# Patient Record
Sex: Female | Born: 1975 | Race: White | Hispanic: No | Marital: Married | Smoking: Former smoker
Health system: Southern US, Community
[De-identification: ages and names within clinical notes are randomized; demographics above are authoritative.]

## PROBLEM LIST (undated history)

## (undated) DIAGNOSIS — E282 Polycystic ovarian syndrome: Secondary | ICD-10-CM

## (undated) DIAGNOSIS — I10 Essential (primary) hypertension: Secondary | ICD-10-CM

## (undated) DIAGNOSIS — I4891 Unspecified atrial fibrillation: Secondary | ICD-10-CM

## (undated) DIAGNOSIS — E119 Type 2 diabetes mellitus without complications: Secondary | ICD-10-CM

## (undated) DIAGNOSIS — K219 Gastro-esophageal reflux disease without esophagitis: Secondary | ICD-10-CM

## (undated) DIAGNOSIS — M1990 Unspecified osteoarthritis, unspecified site: Secondary | ICD-10-CM

## (undated) DIAGNOSIS — N390 Urinary tract infection, site not specified: Secondary | ICD-10-CM

## (undated) DIAGNOSIS — F32A Depression, unspecified: Secondary | ICD-10-CM

## (undated) DIAGNOSIS — F419 Anxiety disorder, unspecified: Secondary | ICD-10-CM

## (undated) DIAGNOSIS — E669 Obesity, unspecified: Secondary | ICD-10-CM

## (undated) DIAGNOSIS — I499 Cardiac arrhythmia, unspecified: Secondary | ICD-10-CM

## (undated) DIAGNOSIS — F329 Major depressive disorder, single episode, unspecified: Secondary | ICD-10-CM

## (undated) HISTORY — DX: Unspecified osteoarthritis, unspecified site: M19.90

## (undated) HISTORY — PX: WISDOM TOOTH EXTRACTION: SHX21

## (undated) HISTORY — PX: DILATION AND CURETTAGE OF UTERUS: SHX78

---

## 2013-01-05 ENCOUNTER — Emergency Department (HOSPITAL_COMMUNITY)
Admission: EM | Admit: 2013-01-05 | Discharge: 2013-01-05 | Disposition: A | Discharge status: Home / Self Care | Payer: 59 | Source: Home / Self Care | Attending: Emergency Medicine | Admitting: Emergency Medicine

## 2013-01-05 DIAGNOSIS — R51 Headache: Secondary | ICD-10-CM

## 2013-01-05 DIAGNOSIS — R519 Headache, unspecified: Secondary | ICD-10-CM

## 2013-01-05 MED ORDER — KETOROLAC TROMETHAMINE 60 MG/2ML IM SOLN: 60.0000 mg | Freq: Once | INTRAMUSCULAR | Status: DC

## 2013-01-05 MED ORDER — CYCLOBENZAPRINE HCL 10 MG PO TABS: 10.0000 mg | ORAL_TABLET | Freq: Three times a day (TID) | ORAL | Status: DC | PRN

## 2013-01-05 MED ORDER — METHYLPREDNISOLONE ACETATE 40 MG/ML IJ SUSP: 80.0000 mg | Freq: Once | INTRAMUSCULAR | Status: DC

## 2013-01-05 MED ORDER — METHYLPREDNISOLONE ACETATE 80 MG/ML IJ SUSP
INTRAMUSCULAR | Status: AC
  Filled 2013-01-05: qty 1

## 2013-01-05 MED ORDER — KETOROLAC TROMETHAMINE 60 MG/2ML IM SOLN
INTRAMUSCULAR | Status: AC
  Filled 2013-01-05: qty 2

## 2013-01-05 MED ORDER — NAPROXEN 500 MG PO TABS: 500.0000 mg | ORAL_TABLET | Freq: Two times a day (BID) | ORAL | Status: DC

## 2013-01-05 MED ORDER — METHYLPREDNISOLONE ACETATE 40 MG/ML IJ SUSP
80.0000 mg | Freq: Once | INTRAMUSCULAR | Status: AC
  Administered 2013-01-05: 80 mg via INTRAMUSCULAR

## 2013-01-05 MED ORDER — KETOROLAC TROMETHAMINE 60 MG/2ML IM SOLN
60.0000 mg | Freq: Once | INTRAMUSCULAR | Status: AC
  Administered 2013-01-05: 60 mg via INTRAMUSCULAR

## 2013-01-05 NOTE — ED Provider Notes (Signed)
History     CSN: [id number]  Arrival date & time 01/05/13  1813   First MD Initiated Contact with Patient 01/05/13 1908      No chief complaint on file.   (Consider location/radiation/quality/duration/timing/severity/associated sxs/prior treatment) HPI Comments: 37 year old female presents for evaluation of headache that has been going on for 3 days. It began as pain and pressure in the back of her neck and has since spread up around scalp. The headache is bilateral and about a 5/10 in intensity. The pain is constant, not throbbing in nature. She did feel a bit nauseous yesterday but did not vomit. She denies any fever, chills, rash, photophobia, blurry vision, or any other associated symptoms. She has never experienced a headache exactly like this in the past.   No past medical history on file.  No past surgical history on file.  No family history on file.  History  Substance Use Topics  . Smoking status: Not on file  . Smokeless tobacco: Not on file  . Alcohol Use: Not on file    OB History   No data available      Review of Systems  Constitutional: Negative for fever and chills.  Eyes: Negative for visual disturbance.  Respiratory: Negative for cough and shortness of breath.   Cardiovascular: Negative for chest pain, palpitations and leg swelling.  Gastrointestinal: Positive for nausea. Negative for vomiting, abdominal pain and diarrhea.  Endocrine: Negative for polydipsia and polyuria.  Genitourinary: Negative for dysuria, urgency and frequency.  Musculoskeletal: Negative for myalgias and arthralgias.  Skin: Negative for rash.  Neurological: Positive for headaches. Negative for dizziness, seizures, syncope, weakness, light-headedness and numbness.    Allergies  Review of patient's allergies indicates not on file.  Home Medications   Current Outpatient Rx  Name  Route  Sig  Dispense  Refill  . cyclobenzaprine (FLEXERIL) 10 MG tablet   Oral   Take 1 tablet  (10 mg total) by mouth 3 (three) times daily as needed for muscle spasms.   30 tablet   0   . naproxen (NAPROSYN) 500 MG tablet   Oral   Take 1 tablet (500 mg total) by mouth 2 (two) times daily.   60 tablet   0     BP 137/91  Pulse 89  Temp(Src) 97.9 F (36.6 C) (Oral)  Resp 16  SpO2 97%  Physical Exam  Nursing note and vitals reviewed. Constitutional: She is oriented to person, place, and time. Vital signs are normal. She appears well-developed and well-nourished. No distress.  HENT:  Head: Atraumatic.  Eyes: EOM are normal. Pupils are equal, round, and reactive to light.  Cardiovascular: Normal rate, regular rhythm and normal heart sounds.  Exam reveals no gallop and no friction rub.   No murmur heard. Pulmonary/Chest: Effort normal and breath sounds normal. No respiratory distress. She has no wheezes. She has no rales.  Abdominal: Soft. There is no tenderness.  Musculoskeletal:       Cervical back: She exhibits tenderness (Paraspinous muscle tightness and tenderness at the base of the occiput ). She exhibits no bony tenderness.  Neurological: She is alert and oriented to person, place, and time. She has normal strength.  Skin: Skin is warm and dry. She is not diaphoretic.  Psychiatric: She has a normal mood and affect. Her behavior is normal. Judgment normal.    ED Course  Procedures (including critical care time)  Labs Reviewed - No data to display No results found.   1. Headache  MDM  The neurologic exam is completely normal. This appears to be the result of muscle tightness in the neck. We'll treat with NSAIDs and muscle relaxers, as well as heating pad. She will followup if she is not improved.   Meds ordered this encounter  Medications  . DISCONTD: ketorolac (TORADOL) injection 60 mg    Sig:   . DISCONTD: methylPREDNISolone acetate (DEPO-MEDROL) injection 80 mg    Sig:   . ketorolac (TORADOL) injection 60 mg    Sig:   . methylPREDNISolone  acetate (DEPO-MEDROL) injection 80 mg    Sig:   . naproxen (NAPROSYN) 500 MG tablet    Sig: Take 1 tablet (500 mg total) by mouth 2 (two) times daily.    Dispense:  60 tablet    Refill:  0  . cyclobenzaprine (FLEXERIL) 10 MG tablet    Sig: Take 1 tablet (10 mg total) by mouth 3 (three) times daily as needed for muscle spasms.    Dispense:  30 tablet    Refill:  0           Graylon Good, PA-C 01/05/13 1949

## 2013-01-05 NOTE — ED Provider Notes (Signed)
Medical screening examination/treatment/procedure(s) were performed by non-physician practitioner and as supervising physician I was immediately available for consultation/collaboration.  Leslee Home, M.D.  Reuben Likes, MD 01/05/13 [phone]

## 2013-03-08 ENCOUNTER — Ambulatory Visit: Payer: 59 | Admitting: Family Medicine

## 2013-03-14 ENCOUNTER — Ambulatory Visit: Payer: 59 | Admitting: Internal Medicine

## 2013-05-15 ENCOUNTER — Ambulatory Visit: Payer: 59 | Admitting: Internal Medicine

## 2013-05-15 DIAGNOSIS — Z0289 Encounter for other administrative examinations: Secondary | ICD-10-CM

## 2013-08-30 ENCOUNTER — Other Ambulatory Visit (HOSPITAL_COMMUNITY)
Admission: RE | Admit: 2013-08-30 | Discharge: 2013-08-30 | Disposition: A | Discharge status: Home / Self Care | Payer: 59 | Source: Ambulatory Visit | Attending: Family Medicine | Admitting: Family Medicine

## 2013-08-30 ENCOUNTER — Other Ambulatory Visit: Payer: Self-pay | Admitting: Physician Assistant

## 2013-08-30 DIAGNOSIS — Z124 Encounter for screening for malignant neoplasm of cervix: Secondary | ICD-10-CM | POA: Insufficient documentation

## 2013-09-06 ENCOUNTER — Other Ambulatory Visit: Payer: Self-pay | Admitting: Physician Assistant

## 2014-02-08 ENCOUNTER — Emergency Department (INDEPENDENT_AMBULATORY_CARE_PROVIDER_SITE_OTHER): Payer: 59

## 2014-02-08 ENCOUNTER — Emergency Department (INDEPENDENT_AMBULATORY_CARE_PROVIDER_SITE_OTHER)
Admission: EM | Admit: 2014-02-08 | Discharge: 2014-02-08 | Disposition: A | Discharge status: Home / Self Care | Payer: 59 | Source: Home / Self Care | Attending: Emergency Medicine | Admitting: Emergency Medicine

## 2014-02-08 ENCOUNTER — Encounter (HOSPITAL_COMMUNITY): Payer: Self-pay | Admitting: Emergency Medicine

## 2014-02-08 DIAGNOSIS — M129 Arthropathy, unspecified: Secondary | ICD-10-CM

## 2014-02-08 DIAGNOSIS — M1990 Unspecified osteoarthritis, unspecified site: Secondary | ICD-10-CM

## 2014-02-08 HISTORY — DX: Essential (primary) hypertension: I10

## 2014-02-08 HISTORY — DX: Type 2 diabetes mellitus without complications: E11.9

## 2014-02-08 LAB — URIC ACID: Uric Acid, Serum: 5.5 mg/dL (ref 2.4–7.0)

## 2014-02-08 MED ORDER — INDOMETHACIN 50 MG PO CAPS: 50.0000 mg | ORAL_CAPSULE | Freq: Two times a day (BID) | ORAL | Status: DC

## 2014-02-08 MED ORDER — HYDROCODONE-ACETAMINOPHEN 5-325 MG PO TABS: ORAL_TABLET | ORAL | Status: DC

## 2014-02-08 NOTE — ED Provider Notes (Signed)
Chief Complaint   Chief Complaint  Patient presents with  . Hand Pain    History of Present Illness   Julia Dickerson is a 38 year old female who has had a history since yesterday of pain and swelling of the right index finger MCP joint. She denies any trauma. She works from home and uses a mouse and keyboard all day long. It hurts to flex and extend although she has a full range of motion. She denies any other joint pain. She's had no fever or chills. No history of gout.  Review of Systems   Other than as noted above, the patient denies any of the following symptoms: Systemic:  No fevers or chills. Musculoskeletal:  No joint pain or arthritis.  Neurological:  No muscular weakness or paresthesias.  PMFSH   Past medical history, family history, social history, meds, and allergies were reviewed.     Physical Examination   Vital signs:  BP 162/95  Pulse 87  Temp(Src) 98.9 F (37.2 C) (Oral)  Resp 18  SpO2 98%  LMP 01/30/2014 Gen:  Alert and oriented times 3.  In no distress. Musculoskeletal:  Exam of the hand reveals there was swelling and pain to palpation over the MCP joint of the right index finger. The finger has a full range of motion actively and passively but with pain on movement. There is no heat or erythema.  Otherwise, all other joints had a full a ROM with no swelling, bruising or deformity.  No edema, pulses full. Extremities were warm and pink.  Capillary refill was brisk.  Skin:  Clear, warm and dry.  No rash. Neuro:  Alert and oriented times 3.  Muscle strength was normal.  Sensation was intact to light touch.    Labs   Results for orders placed during the hospital encounter of 02/08/14  URIC ACID      Result Value Ref Range   Uric Acid, Serum 5.5  2.4 - 7.0 mg/dL   Radiology   Dg Hand Complete Right  02/08/2014   CLINICAL DATA:  Left index finger swelling  EXAM: RIGHT HAND - COMPLETE 3+ VIEW  COMPARISON:  None.  FINDINGS: There is no evidence of fracture or  dislocation. There is no evidence of arthropathy or other focal bone abnormality. Soft tissues are unremarkable.  IMPRESSION: Negative.   Electronically Signed   By: Elige Ko   On: 02/08/2014 18:41   I reviewed the images independently and personally and concur with the radiologist's findings.  Assessment   The encounter diagnosis was Arthritis.  Probably due to overuse. No evidence of gout, infection, or trauma.  Plan  1.  Meds:  The following meds were prescribed:   Discharge Medication List as of 02/08/2014  7:15 PM    START taking these medications   Details  HYDROcodone-acetaminophen (NORCO/VICODIN) 5-325 MG per tablet 1 to 2 tabs every 4 to 6 hours as needed for pain., Print    indomethacin (INDOCIN) 50 MG capsule Take 1 capsule (50 mg total) by mouth 2 (two) times daily with a meal., Starting 02/08/2014, Until Discontinued, Normal        2.  Patient Education/Counseling:  The patient was given appropriate handouts, self care instructions, and instructed in symptomatic relief, including rest and activity, and elevation. Suggested that she rest the finger and avoid typing, use of mouse, and keyboarding for the next 3 days.  3.  Follow up:  The patient was told to follow up here if no better in  3 to 4 days, or sooner if becoming worse in any way, and given some red flag symptoms such as worsening pain, fever, swelling, or neurological symptoms which would prompt immediate return.        Reuben Likesavid C Willem Klingensmith, MD 02/08/14 [phone]

## 2014-02-08 NOTE — ED Notes (Signed)
Call back number for lab issues verified 

## 2014-02-08 NOTE — ED Notes (Signed)
C/o onset yesterday of pain and swelling right hand. Denies injury. 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 Pain mostly right index MP joint

## 2014-02-08 NOTE — Discharge Instructions (Signed)
Arthritis, Nonspecific °Arthritis is pain, redness, warmth, or puffiness (inflammation) of a joint. The joint may be stiff or hurt when you move it. One or more joints may be affected. There are many types of arthritis. Your doctor may not know what type you have right away. The most common cause of arthritis is wear and tear on the joint (osteoarthritis). °HOME CARE  °· Only take medicine as told by your doctor. °· Rest the joint as much as possible. °· Raise (elevate) your joint if it is puffy. °· Use crutches if the painful joint is in your leg. °· Drink enough fluids to keep your pee (urine) clear or pale yellow. °· Follow your doctor's diet instructions. °· Use cold packs for very bad joint pain for 10 to 15 minutes every hour. Ask your doctor if it is okay for you to use hot packs. °· Exercise as told by your doctor. °· Take a warm shower if you have stiffness in the morning. °· Move your sore joints throughout the day. °GET HELP RIGHT AWAY IF:  °· You have a fever. °· You have very bad joint pain, puffiness, or redness. °· You have many joints that are painful and puffy. °· You are not getting better with treatment. °· You have very bad back pain or leg weakness. °· You cannot control when you poop (bowel movement) or pee (urinate). °· You do not feel better in 24 hours or are getting worse. °· You are having side effects from your medicine. °MAKE SURE YOU:  °· Understand these instructions. °· Will watch your condition. °· Will get help right away if you are not doing well or get worse. °Document Released: 10/06/2009 Document Revised: 01/11/2012 Document Reviewed: 10/06/2009 °ExitCare® Patient Information ©2015 [employer]. This information is not intended to replace advice given to you by your health care provider. Make sure you discuss any questions you have with your health care provider. ° °

## 2014-03-25 ENCOUNTER — Encounter (HOSPITAL_COMMUNITY): Payer: Self-pay | Admitting: Emergency Medicine

## 2014-03-25 ENCOUNTER — Emergency Department (HOSPITAL_COMMUNITY)
Admission: EM | Admit: 2014-03-25 | Discharge: 2014-03-25 | Disposition: A | Discharge status: Home / Self Care | Payer: 59 | Attending: Emergency Medicine | Admitting: Emergency Medicine

## 2014-03-25 DIAGNOSIS — R319 Hematuria, unspecified: Secondary | ICD-10-CM | POA: Insufficient documentation

## 2014-03-25 DIAGNOSIS — E119 Type 2 diabetes mellitus without complications: Secondary | ICD-10-CM | POA: Diagnosis not present

## 2014-03-25 DIAGNOSIS — Z79899 Other long term (current) drug therapy: Secondary | ICD-10-CM | POA: Diagnosis not present

## 2014-03-25 DIAGNOSIS — I10 Essential (primary) hypertension: Secondary | ICD-10-CM | POA: Diagnosis not present

## 2014-03-25 DIAGNOSIS — Z87891 Personal history of nicotine dependence: Secondary | ICD-10-CM | POA: Insufficient documentation

## 2014-03-25 DIAGNOSIS — N390 Urinary tract infection, site not specified: Secondary | ICD-10-CM

## 2014-03-25 DIAGNOSIS — Z3202 Encounter for pregnancy test, result negative: Secondary | ICD-10-CM | POA: Insufficient documentation

## 2014-03-25 DIAGNOSIS — R300 Dysuria: Secondary | ICD-10-CM | POA: Insufficient documentation

## 2014-03-25 HISTORY — DX: Urinary tract infection, site not specified: N39.0

## 2014-03-25 LAB — URINALYSIS, ROUTINE W REFLEX MICROSCOPIC
Bilirubin Urine: NEGATIVE
Glucose, UA: NEGATIVE mg/dL
Ketones, ur: 15 mg/dL — AB
Nitrite: POSITIVE — AB
Protein, ur: 100 mg/dL — AB
Specific Gravity, Urine: 1.011 (ref 1.005–1.030)
Urobilinogen, UA: 1 mg/dL (ref 0.0–1.0)
pH: 5.5 (ref 5.0–8.0)

## 2014-03-25 LAB — URINE MICROSCOPIC-ADD ON

## 2014-03-25 LAB — POC URINE PREG, ED: Preg Test, Ur: NEGATIVE

## 2014-03-25 MED ORDER — CEPHALEXIN 500 MG PO CAPS: 500.0000 mg | ORAL_CAPSULE | Freq: Three times a day (TID) | ORAL | Status: DC

## 2014-03-25 MED ORDER — PHENAZOPYRIDINE HCL 100 MG PO TABS
200.0000 mg | ORAL_TABLET | Freq: Once | ORAL | Status: AC
  Administered 2014-03-25: 200 mg via ORAL
  Filled 2014-03-25: qty 2

## 2014-03-25 MED ORDER — PHENAZOPYRIDINE HCL 200 MG PO TABS: 200.0000 mg | ORAL_TABLET | Freq: Three times a day (TID) | ORAL | Status: DC | PRN

## 2014-03-25 MED ORDER — CEPHALEXIN 250 MG PO CAPS
500.0000 mg | ORAL_CAPSULE | Freq: Once | ORAL | Status: AC
  Administered 2014-03-25: 500 mg via ORAL
  Filled 2014-03-25: qty 2

## 2014-03-25 NOTE — ED Notes (Signed)
Pt not in room.

## 2014-03-25 NOTE — ED Provider Notes (Signed)
Medical screening examination/treatment/procedure(s) were performed by non-physician practitioner and as supervising physician I was immediately available for consultation/collaboration.     Carlette Palmatier E StSuzi Roots/31/15 [phone]

## 2014-03-25 NOTE — ED Provider Notes (Signed)
CSN: [id number]     Arrival date & time 03/25/14  7829 History   First MD Initiated Contact with Patient 03/25/14 [phone]     Chief Complaint  Patient presents with  . Hematuria  . Dysuria     (Consider location/radiation/quality/duration/timing/severity/associated sxs/prior Treatment) The history is provided by the patient and medical records.   38 y.o. F with HTN, DM2, UTI, presenting to the ED for dysuria which began last night.  Patient states she has frequent urination every few minutes which feelings like "i am urinating out razor blades".  Also notes some dysuria and cloudiness to her urine.  Denies flank pain, abdominal pain, fevers, sweats, chills.  No nausea, vomiting, diarrhea.  No vaginal complaints, new sexual partners, or concern for STD.  Hx UTI in the past with similar sx.  No hx of kidney stones.  No intervention tried PTA.  Past Medical History  Diagnosis Date  . Hypertension   . Diabetes mellitus without complication   . UTI (lower urinary tract infection)    Past Surgical History  Procedure Laterality Date  . Dilation and curettage of uterus     No family history on file. History  Substance Use Topics  . Smoking status: Former Games developer  . Smokeless tobacco: Not on file     Comment: vapor  . Alcohol Use: Yes     Comment: rare   OB History   Grav Para Term Preterm Abortions TAB SAB Ect Mult Living                 Review of Systems  Genitourinary: Positive for dysuria, frequency and hematuria.  All other systems reviewed and are negative.     Allergies  Sulfa antibiotics  Home Medications   Prior to Admission medications   Medication Sig Start Date End Date Taking? Authorizing Provider  atorvastatin (LIPITOR) 20 MG tablet Take 20 mg by mouth daily.  03/15/14  Yes Historical Provider, MD  benazepril (LOTENSIN) 20 MG tablet Take 20 mg by mouth daily.   Yes Historical Provider, MD  buPROPion (WELLBUTRIN SR) 150 MG 12 hr tablet Take 150 mg by mouth 2  (two) times daily.  03/01/14  Yes Historical Provider, MD  escitalopram (LEXAPRO) 10 MG tablet Take 10 mg by mouth daily.  03/15/14  Yes Historical Provider, MD  lisinopril (PRINIVIL,ZESTRIL) 10 MG tablet Take 10 mg by mouth daily.  03/01/14  Yes Historical Provider, MD  metFORMIN (GLUCOPHAGE) 1000 MG tablet Take 500 mg by mouth 2 (two) times daily with a meal.    Yes Historical Provider, MD   BP 143/70  Pulse 73  Temp(Src) 98.2 F (36.8 C) (Oral)  Resp 18  Ht  (1.727 m)  Wt 236 lb (107.049 kg)  BMI 35.89 kg/m2  SpO2 100%  LMP 03/16/2014  Physical Exam  Nursing note and vitals reviewed. Constitutional: She is oriented to person, place, and time. She appears well-developed and well-nourished. No distress.  HENT:  Head: Normocephalic and atraumatic.  Mouth/Throat: Oropharynx is clear and moist.  Eyes: Conjunctivae and EOM are normal. Pupils are equal, round, and reactive to light.  Neck: Normal range of motion. Neck supple.  Cardiovascular: Normal rate, regular rhythm and normal heart sounds.   Pulmonary/Chest: Effort normal and breath sounds normal. No respiratory distress. She has no wheezes.  Abdominal: Soft. Bowel sounds are normal. There is no tenderness. There is no guarding and no CVA tenderness.  Musculoskeletal: Normal range of motion.  Neurological: She is alert and  oriented to person, place, and time.  Skin: Skin is warm and dry. She is not diaphoretic.  Psychiatric: She has a normal mood and affect.    ED Course  Procedures (including critical care time) Labs Review Labs Reviewed  URINALYSIS, ROUTINE W REFLEX MICROSCOPIC - Abnormal; Notable for the following:    Color, Urine RED (*)    APPearance CLOUDY (*)    Hgb urine dipstick LARGE (*)    Ketones, ur 15 (*)    Protein, ur 100 (*)    Nitrite POSITIVE (*)    Leukocytes, UA MODERATE (*)    All other components within normal limits  URINE MICROSCOPIC-ADD ON - Abnormal; Notable for the following:    Squamous  Epithelial / LPF FEW (*)    Bacteria, UA MANY (*)    All other components within normal limits  POC URINE PREG, ED    Imaging Review No results found.   EKG Interpretation None      MDM   Final diagnoses:  UTI (lower urinary tract infection)  Dysuria  Hematuria   Urine nitrite +.  Abdominal exam is benign, no CVA tenderness to suggest kidney stones or pyelonephritis.  No vaginal sx. Patient afebrile and non-toxic.  Will start on abx and pyridium for comfort.  FU with PCP.  Discussed plan with patient, he/she acknowledged understanding and agreed with plan of care.  Return precautions given for new or worsening symptoms.  Garlon Hatchet, PA-C 03/25/14 0835  Garlon Hatchet, PA-C 03/25/14 [phone]

## 2014-03-25 NOTE — ED Notes (Signed)
Pt c/o painful urination. Pt gave urine sample prior to coming to room and is bloody. Pt has history of UTI in the past. Pt denies N/V.

## 2014-03-25 NOTE — Discharge Instructions (Signed)
Take the prescribed medication as directed.  Pyridium will turn your urine bright orange/red which is normal, may stain clothing. Follow-up with your primary care physician. Return to the ED for new or worsening symptoms.

## 2014-07-10 ENCOUNTER — Inpatient Hospital Stay (HOSPITAL_COMMUNITY)
Admission: AD | Admit: 2014-07-10 | Discharge: 2014-07-13 | DRG: 885 | Disposition: A | Discharge status: Home / Self Care | Payer: 59 | Source: Intra-hospital | Attending: Psychiatry | Admitting: Psychiatry

## 2014-07-10 ENCOUNTER — Encounter (HOSPITAL_COMMUNITY): Payer: Self-pay | Admitting: Emergency Medicine

## 2014-07-10 ENCOUNTER — Emergency Department (HOSPITAL_COMMUNITY)
Admission: EM | Admit: 2014-07-10 | Discharge: 2014-07-10 | Disposition: A | Discharge status: Home / Self Care | Payer: 59 | Attending: Emergency Medicine | Admitting: Emergency Medicine

## 2014-07-10 ENCOUNTER — Encounter (HOSPITAL_COMMUNITY): Payer: Self-pay | Admitting: Behavioral Health

## 2014-07-10 DIAGNOSIS — Z79899 Other long term (current) drug therapy: Secondary | ICD-10-CM

## 2014-07-10 DIAGNOSIS — Z87440 Personal history of urinary (tract) infections: Secondary | ICD-10-CM | POA: Insufficient documentation

## 2014-07-10 DIAGNOSIS — I10 Essential (primary) hypertension: Secondary | ICD-10-CM | POA: Insufficient documentation

## 2014-07-10 DIAGNOSIS — R45851 Suicidal ideations: Secondary | ICD-10-CM | POA: Diagnosis present

## 2014-07-10 DIAGNOSIS — F332 Major depressive disorder, recurrent severe without psychotic features: Principal | ICD-10-CM | POA: Diagnosis present

## 2014-07-10 DIAGNOSIS — Z792 Long term (current) use of antibiotics: Secondary | ICD-10-CM | POA: Insufficient documentation

## 2014-07-10 DIAGNOSIS — E119 Type 2 diabetes mellitus without complications: Secondary | ICD-10-CM | POA: Diagnosis present

## 2014-07-10 DIAGNOSIS — Z87891 Personal history of nicotine dependence: Secondary | ICD-10-CM | POA: Diagnosis not present

## 2014-07-10 DIAGNOSIS — Z3202 Encounter for pregnancy test, result negative: Secondary | ICD-10-CM | POA: Insufficient documentation

## 2014-07-10 DIAGNOSIS — F339 Major depressive disorder, recurrent, unspecified: Secondary | ICD-10-CM | POA: Diagnosis present

## 2014-07-10 DIAGNOSIS — F329 Major depressive disorder, single episode, unspecified: Secondary | ICD-10-CM | POA: Diagnosis present

## 2014-07-10 LAB — CBC
HCT: 43.8 % (ref 36.0–46.0)
Hemoglobin: 15.1 g/dL — ABNORMAL HIGH (ref 12.0–15.0)
MCH: 31 pg (ref 26.0–34.0)
MCHC: 34.5 g/dL (ref 30.0–36.0)
MCV: 89.9 fL (ref 78.0–100.0)
Platelets: 270 10*3/uL (ref 150–400)
RBC: 4.87 MIL/uL (ref 3.87–5.11)
RDW: 12.9 % (ref 11.5–15.5)
WBC: 9.3 10*3/uL (ref 4.0–10.5)

## 2014-07-10 LAB — COMPREHENSIVE METABOLIC PANEL
ALT: 20 U/L (ref 0–35)
AST: 15 U/L (ref 0–37)
Albumin: 4 g/dL (ref 3.5–5.2)
Alkaline Phosphatase: 81 U/L (ref 39–117)
Anion gap: 14 (ref 5–15)
BUN: 9 mg/dL (ref 6–23)
CO2: 23 mEq/L (ref 19–32)
Calcium: 9.8 mg/dL (ref 8.4–10.5)
Chloride: 99 mEq/L (ref 96–112)
Creatinine, Ser: 0.59 mg/dL (ref 0.50–1.10)
GFR calc Af Amer: >90 mL/min (ref >90)
GFR calc non Af Amer: >90 mL/min (ref >90)
Glucose, Bld: 153 mg/dL — ABNORMAL HIGH (ref 70–99)
Potassium: 4.3 mEq/L (ref 3.7–5.3)
Sodium: 136 mEq/L — ABNORMAL LOW (ref 137–147)
Total Bilirubin: 0.4 mg/dL (ref 0.3–1.2)
Total Protein: 8 g/dL (ref 6.0–8.3)

## 2014-07-10 LAB — RAPID URINE DRUG SCREEN, HOSP PERFORMED
Amphetamines: NOT DETECTED
Barbiturates: NOT DETECTED
Benzodiazepines: NOT DETECTED
Cocaine: NOT DETECTED
Opiates: NOT DETECTED
Tetrahydrocannabinol: NOT DETECTED

## 2014-07-10 LAB — SALICYLATE LEVEL: Salicylate Lvl: <2 mg/dL — ABNORMAL LOW (ref 2.8–20.0)

## 2014-07-10 LAB — ETHANOL: Alcohol, Ethyl (B): <11 mg/dL (ref 0–11)

## 2014-07-10 LAB — PREGNANCY, URINE: Preg Test, Ur: NEGATIVE

## 2014-07-10 LAB — ACETAMINOPHEN LEVEL: Acetaminophen (Tylenol), Serum: <15 ug/mL (ref 10–30)

## 2014-07-10 MED ORDER — ALUM & MAG HYDROXIDE-SIMETH 200-200-20 MG/5ML PO SUSP
30.0000 mL | ORAL | Status: DC | PRN
  Administered 2014-07-11 – 2014-07-12 (×2): 30 mL via ORAL
  Filled 2014-07-10 (×2): qty 30

## 2014-07-10 MED ORDER — ONDANSETRON HCL 4 MG PO TABS: 4.0000 mg | ORAL_TABLET | Freq: Three times a day (TID) | ORAL | Status: DC | PRN

## 2014-07-10 MED ORDER — ACETAMINOPHEN 325 MG PO TABS: 650.0000 mg | ORAL_TABLET | Freq: Four times a day (QID) | ORAL | Status: DC | PRN

## 2014-07-10 MED ORDER — IBUPROFEN 400 MG PO TABS: 600.0000 mg | ORAL_TABLET | Freq: Three times a day (TID) | ORAL | Status: DC | PRN

## 2014-07-10 MED ORDER — BENAZEPRIL HCL 20 MG PO TABS: 20.0000 mg | ORAL_TABLET | Freq: Every day | ORAL | Status: DC

## 2014-07-10 MED ORDER — MAGNESIUM HYDROXIDE 400 MG/5ML PO SUSP: 30.0000 mL | Freq: Every day | ORAL | Status: DC | PRN

## 2014-07-10 MED ORDER — ESCITALOPRAM OXALATE 10 MG PO TABS
10.0000 mg | ORAL_TABLET | Freq: Every day | ORAL | Status: DC
  Administered 2014-07-10: 10 mg via ORAL
  Filled 2014-07-10: qty 1

## 2014-07-10 MED ORDER — ACETAMINOPHEN 325 MG PO TABS: 650.0000 mg | ORAL_TABLET | ORAL | Status: DC | PRN

## 2014-07-10 MED ORDER — ZOLPIDEM TARTRATE 5 MG PO TABS: 5.0000 mg | ORAL_TABLET | Freq: Every evening | ORAL | Status: DC | PRN

## 2014-07-10 MED ORDER — ATORVASTATIN CALCIUM 20 MG PO TABS
20.0000 mg | ORAL_TABLET | Freq: Every day | ORAL | Status: DC
  Administered 2014-07-10 – 2014-07-13 (×4): 20 mg via ORAL
  Filled 2014-07-10 (×2): qty 1
  Filled 2014-07-10: qty 2
  Filled 2014-07-10 (×4): qty 1

## 2014-07-10 MED ORDER — IBUPROFEN 200 MG PO TABS: 200.0000 mg | ORAL_TABLET | Freq: Four times a day (QID) | ORAL | Status: DC | PRN

## 2014-07-10 MED ORDER — TRAZODONE HCL 50 MG PO TABS
50.0000 mg | ORAL_TABLET | Freq: Every evening | ORAL | Status: DC | PRN
  Administered 2014-07-10: 50 mg via ORAL
  Filled 2014-07-10: qty 2
  Filled 2014-07-10: qty 1

## 2014-07-10 MED ORDER — ALUM & MAG HYDROXIDE-SIMETH 200-200-20 MG/5ML PO SUSP: 30.0000 mL | ORAL | Status: DC | PRN

## 2014-07-10 MED ORDER — ATORVASTATIN CALCIUM 20 MG PO TABS
20.0000 mg | ORAL_TABLET | Freq: Every day | ORAL | Status: DC
  Filled 2014-07-10: qty 1

## 2014-07-10 MED ORDER — DIPHENHYDRAMINE HCL 25 MG PO TABS
25.0000 mg | ORAL_TABLET | Freq: Four times a day (QID) | ORAL | Status: DC | PRN
  Filled 2014-07-10: qty 1

## 2014-07-10 MED ORDER — LORAZEPAM 1 MG PO TABS: 1.0000 mg | ORAL_TABLET | Freq: Three times a day (TID) | ORAL | Status: DC | PRN

## 2014-07-10 MED ORDER — LISINOPRIL 10 MG PO TABS
10.0000 mg | ORAL_TABLET | Freq: Every day | ORAL | Status: DC
  Administered 2014-07-11 – 2014-07-13 (×3): 10 mg via ORAL
  Filled 2014-07-10 (×5): qty 1

## 2014-07-10 MED ORDER — METFORMIN HCL 500 MG PO TABS
1000.0000 mg | ORAL_TABLET | Freq: Two times a day (BID) | ORAL | Status: DC
  Administered 2014-07-10 – 2014-07-13 (×6): 1000 mg via ORAL
  Filled 2014-07-10 (×11): qty 2

## 2014-07-10 MED ORDER — LISINOPRIL 10 MG PO TABS
10.0000 mg | ORAL_TABLET | Freq: Every day | ORAL | Status: DC
  Administered 2014-07-10: 10 mg via ORAL
  Filled 2014-07-10: qty 1

## 2014-07-10 MED ORDER — BUPROPION HCL ER (SR) 150 MG PO TB12
150.0000 mg | ORAL_TABLET | Freq: Two times a day (BID) | ORAL | Status: DC
  Administered 2014-07-10: 150 mg via ORAL
  Filled 2014-07-10 (×2): qty 1

## 2014-07-10 MED ORDER — METFORMIN HCL 500 MG PO TABS: 1000.0000 mg | ORAL_TABLET | Freq: Two times a day (BID) | ORAL | Status: DC

## 2014-07-10 NOTE — BH Assessment (Addendum)
Tele Assessment Note   Julia Dickerson is an 38 y.o. female. Pt presents to [facility] with C/O increased depression and SI with thoughts and plans of cutting herself with a blade or suffocating herself with a pillow. Pt reports that she has battled depression for years but never received any treatment. Pt reports that she had a conversation with her husband yesterday and the conversation did not go well. Pt reports that her husband recently loss his job in November 2015, and could not continue his employment as a Naval architecttruck driver because they could not afford his cpap machine. Pt reports mood instability as evidenced by irritable mood and depression. Pt reports on-going conflict with her mother who she states did not support [place]. Pt denies illicit substance use. Pt denies history of aggression or violence. Pt denies HI and no AVH reported.  Consulted with AC Berneice Heinrichina Tate an Cherlyn CushingShelly Eisbach,NP whom is recommending inpatient psychiatric care for safety and stabilization.  Axis I: Major Depression, single episode Axis II: Deferred Axis III:  Past Medical History  Diagnosis Date  . Hypertension   . Diabetes mellitus without complication   . UTI (lower urinary tract infection)    Axis IV: economic problems, other psychosocial or environmental problems and problems related to social environment Axis V: 21-30 behavior considerably influenced by delusions or hallucinations OR serious impairment in judgment, communication OR inability to function in almost all areas  Past Medical History:  Past Medical History  Diagnosis Date  . Hypertension   . Diabetes mellitus without complication   . UTI (lower urinary tract infection)     Past Surgical History  Procedure Laterality Date  . Dilation and curettage of uterus    . Wisdom tooth extraction      Family History: No family history on file.  Social History:  reports that she has quit smoking. She does not have any  smokeless tobacco history on file. She reports that she drinks alcohol. She reports that she does not use illicit drugs.  Additional Social History:  Alcohol / Drug Use History of alcohol / drug use?: No history of alcohol / drug abuse  CIWA: CIWA-Ar BP: (!) 154/101 mmHg Pulse Rate: 87 COWS:    PATIENT STRENGTHS: (choose at least two) Ability for insight Active sense of humor Average or above average intelligence Capable of independent living  Allergies:  Allergies  Allergen Reactions  . Sulfa Antibiotics Hives    Home Medications:  (Not in a hospital admission)  OB/GYN Status:  Patient's last menstrual period was 05/16/2014.  General Assessment Data [facility] Is this a Tele or Face-to-Face Assessment?: Tele Assessment Is this an Initial Assessment or a Re-assessment for this encounter?: Initial Assessment Living Arrangements: Spouse/significant other, Other (Comment) (lives with husband and 2 roommates.) Can pt return to current living arrangement?: Yes Admission Status: Voluntary Is patient capable of signing voluntary admission?: Yes Transfer from: Home Referral Source: [facility]     [facility] Living Arrangements: Spouse/significant other, Other (Comment) (lives with husband and 2 roommates.) Name of Psychiatrist: No Current Provider Name of Therapist: No Current Provider     Risk to self with the past 6 months Suicidal Ideation: Yes-Currently Present Suicidal Intent: Yes-Currently Present Is patient at risk for suicide?: Yes Suicidal Plan?: Yes-Currently Present Specify Current Suicidal Plan:  (random thoughts, cutting with a blade, pillow-suffocate) Access to Means: Yes Specify Access to Suicidal Means: access to sharp knives  and pillows What has been your use of drugs/alcohol within the last 12 months?: Pt denies Previous Attempts/Gestures: No How many times?: 0 Other Self Harm Risks: none reported Triggers for Past Attempts:  Unpredictable Intentional Self Injurious Behavior: None Family Suicide History: No (Niece diagnosed with PTSD) Recent stressful life event(s): Conflict (Comment), Financial Problems, Turmoil (Comment) Persecutory voices/beliefs?: No Depression: Yes Depression Symptoms: Despondent, Insomnia, Loss of interest in usual pleasures, Feeling worthless/self pity, Feeling angry/irritable Substance abuse history and/or treatment for substance abuse?: No Suicide prevention information given to non-admitted patients: Not applicable  Risk to Others within the past 6 months Homicidal Ideation: No Thoughts of Harm to Others: No Current Homicidal Intent: No Current Homicidal Plan: No Access to Homicidal Means: No Identified Victim: no History of harm to others?: No Assessment of Violence: None Noted Violent Behavior Description: None noted Does patient have access to weapons?: Yes (Comment) (sharp knives in the home) Criminal Charges Pending?: No Does patient have a court date: No  Psychosis Hallucinations: None noted Delusions: None noted  Mental Status Report Appear/Hygiene: In scrubs Eye Contact: Fair Motor Activity: Freedom of movement Speech: Logical/coherent Level of Consciousness: Alert Mood: Depressed Affect: Depressed Anxiety Level: None Thought Processes: Coherent, Relevant Judgement: Unimpaired Orientation: Person, Place, Time, Situation Obsessive Compulsive Thoughts/Behaviors: None  Cognitive Functioning Concentration: Normal Memory: Recent Intact, Remote Intact IQ: Average Insight: Fair Impulse Control: Fair Appetite: Fair Weight Loss: 0 Weight Gain: 0 Sleep: No Change Total Hours of Sleep: 7 Vegetative Symptoms: None  ADLScreening [facility]) Patient's cognitive ability adequate to safely complete daily activities?: Yes Patient able to express need for assistance with ADLs?: Yes Independently performs ADLs?: Yes (appropriate for developmental  age)  Prior Inpatient Therapy Prior Inpatient Therapy: No Prior Therapy Dates: na Prior Therapy Facilty/Provider(s): na Reason for Treatment: na  Prior Outpatient Therapy Prior Outpatient Therapy: No Prior Therapy Dates: na Prior Therapy Facilty/Provider(s): na Reason for Treatment: na  ADL Screening (condition at time of admission) Patient's cognitive ability adequate to safely complete daily activities?: Yes Is the patient deaf or have difficulty hearing?: No Does the patient have difficulty seeing, even when wearing glasses/contacts?: No Does the patient have difficulty concentrating, remembering, or making decisions?: No Patient able to express need for assistance with ADLs?: Yes Does the patient have difficulty dressing or bathing?: No Independently performs ADLs?: Yes (appropriate for developmental age) Does the patient have difficulty walking or climbing stairs?: No Weakness of Legs: None Weakness of Arms/Hands: None  Home Assistive Devices/Equipment Home Assistive Devices/Equipment: None    Abuse/Neglect Assessment (Assessment to be complete while patient is alone) Physical Abuse: Denies Verbal Abuse: Denies Sexual Abuse: Denies Exploitation of patient/patient's resources: Denies Self-Neglect: Denies     Merchant navy officerAdvance Directives (For Healthcare) Does patient have an advance directive?: No Would patient like information on creating an advanced directive?: No - patient declined information    Additional Information 1:1 In Past 12 Months?: No CIRT Risk: No Elopement Risk: No Does patient have medical clearance?: Yes     Disposition:  Disposition Initial Assessment Completed for this Encounter: Yes Disposition of Patient: Other dispositions Other disposition(s):  (pending disposition.)  Tannar Broker, Len BlalockNajah Sabreen Yareli Carthen, MS, LCASA Assessment Counselor  07/10/2014 10:40 AM

## 2014-07-10 NOTE — BH Assessment (Signed)
Writer informed TTS (Najah) of the consult.  

## 2014-07-10 NOTE — ED Notes (Signed)
[place] transportation notified to [facility]
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
## 2014-07-10 NOTE — BHH Counselor (Signed)
[facility] per [facility]richina [facility]   Kateri PlummerKristin Akansha Wyche, M.S., LPCA, [facility] Licensed Professional Counselor Associate  Triage Specialist  [facility]  Therapeutic Triage Services Phone: [phone] Fax: [phone]

## 2014-07-10 NOTE — ED Notes (Signed)
tts in progress 
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
## 2014-07-10 NOTE — ED Notes (Signed)
Placed Patient in wine scrubs.

## 2014-07-10 NOTE — ED Notes (Signed)
Patient meal order taken. She has been given a snack of graham crackers with peanut butter and soda

## 2014-07-10 NOTE — ED Notes (Signed)
PATIENT UP TO DESK TO CALL HER SISTER. CALM DURING CALL

## 2014-07-10 NOTE — Discharge Instructions (Signed)
[facility]

## 2014-07-10 NOTE — ED Notes (Signed)
All belongings, medicine, valuables sent with pt to [facility]

## 2014-07-10 NOTE — ED Notes (Signed)
tts complete 

## 2014-07-10 NOTE — Tx Team (Signed)
Initial Interdisciplinary Treatment Plan   PATIENT STRESSORS: Financial difficulties Marital or family conflict   PATIENT STRENGTHS: Ability for insight General fund of knowledge Motivation for treatment/growth   PROBLEM LIST: Problem List/Patient Goals Date to be addressed Date deferred Reason deferred Estimated date of resolution  Suicidal Ideation 07/10/14     Depression 07/10/14                                                DISCHARGE CRITERIA:  Ability to meet basic life and health needs Improved stabilization in mood, thinking, and/or behavior Motivation to continue treatment in a less acute level of care  PRELIMINARY DISCHARGE PLAN: Attend PHP/IOP Return to previous living arrangement  PATIENT/FAMIILY INVOLVEMENT: This treatment plan has been presented to and reviewed with the patient, Moss Mcleanor Holdren.  The patient and family have been given the opportunity to ask questions and make suggestions.  Melanee SpryByrd, Ronecia E 07/10/2014, 4:39 PM
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
## 2014-07-10 NOTE — ED Notes (Signed)
Patient coming from home with thoughts of suicide.  Patient states "these feelings started yesterday after an argument with their roommates".  Patient has thought of "cutting self with razor or using a pillow".

## 2014-07-10 NOTE — BH Assessment (Signed)
[facility] and it is [facility] that [facility]. Glorious PeachNajah Alanna Storti, MS, LCASA Assessment Counselor

## 2014-07-11 DIAGNOSIS — F329 Major depressive disorder, single episode, unspecified: Secondary | ICD-10-CM

## 2014-07-11 LAB — GLUCOSE, CAPILLARY: Glucose-Capillary: 284 mg/dL — ABNORMAL HIGH (ref 70–99)

## 2014-07-11 MED ORDER — ESCITALOPRAM OXALATE 10 MG PO TABS
10.0000 mg | ORAL_TABLET | Freq: Every day | ORAL | Status: DC
  Administered 2014-07-11 – 2014-07-13 (×3): 10 mg via ORAL
  Filled 2014-07-11 (×5): qty 1

## 2014-07-11 MED ORDER — BUPROPION HCL ER (XL) 150 MG PO TB24
150.0000 mg | ORAL_TABLET | Freq: Every day | ORAL | Status: DC
  Administered 2014-07-11 – 2014-07-13 (×3): 150 mg via ORAL
  Filled 2014-07-11 (×5): qty 1

## 2014-07-11 NOTE — BHH Suicide Risk Assessment (Signed)
   Nursing information obtained from:    Demographic factors:   38 year old female, married , no children Current Mental Status:   See below Loss Factors:   Increased psychosocial stress/ marital stress  Historical Factors:   Depression Risk Reduction Factors:   Resilience  Total Time spent with patient: 45 minutes  CLINICAL FACTORS:  Worsening Depression in the context of significant psychosocial stressors  Psychiatric Specialty Exam: Physical Exam  ROS  Blood pressure 121/72, pulse 74, temperature 98.5 F (36.9 C), temperature source Oral, resp. rate 18, height 5\' 8"  (1.727 m), weight 112 kg (246 lb 14.6 oz), last menstrual period 05/06/2014.Body mass index is 37.55 kg/(m^2).  General Appearance: Fairly Groomed  Patent attorneyye Contact::  Fair  Speech:  Normal Rate  Volume:  Decreased  Mood:  Depressed  Affect:  Constricted and but reactive  Thought Process:  Linear  Orientation:  Other:  fully alert and attentive  Thought Content:  Rumination and denies hallucinations, no delusions, ruminative about psychosocial stressors involving her husband and friends  Suicidal Thoughts:  No at this time denies any thoughts of hurting self and  contracts for safety on unit   Homicidal Thoughts:  No  Memory:  Recent and remote grossly intact  Judgement:  Fair  Insight:  Fair  Psychomotor Activity:  Normal  Concentration:  Good  Recall:  Good  Fund of Knowledge:Good  Language: Good  Akathisia:  Negative  Handed:  Right  AIMS (if indicated):     Assets:  Desire for Improvement Resilience  Sleep:  Number of Hours: 6.5   Musculoskeletal: Strength & Muscle Tone: within normal limits Gait & Station: normal Patient leans: N/A  COGNITIVE FEATURES THAT CONTRIBUTE TO RISK:  Closed-mindedness    SUICIDE RISK:   Moderate:  Frequent suicidal ideation with limited intensity, and duration, some specificity in terms of plans, no associated intent, good self-control, limited dysphoria/symptomatology,  some risk factors present, and identifiable protective factors, including available and accessible social support.  PLAN OF CARE: Patient will be admitted to inpatient psychiatric unit for stabilization and safety. Will provide and encourage milieu participation. Provide medication management and maked adjustments as needed.  Will follow daily.    I certify that inpatient services furnished can reasonably be expected to improve the patient's condition.  Deforrest Bogle 07/11/2014, 3:44 PM
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
## 2014-07-11 NOTE — [facility] Group Notes (Signed)
[facility] LCSW Group Therapy  [facility] 1:15 - 2:30 PM  07/11/2014   Type of Therapy:  Group Therapy  Participation Level: Active  Participation Quality:  Attentive  Affect:  Appropriate  Cognitive:  Appropriate  Insight:  Developing/Improving   Engagement in Therapy:  Developing/Improving   Modes of Intervention:  Discussion, Education, Exploration, Problem-Solving, Rapport Building, Support   Summary of Progress/Problems:   Patient was attentive to speaker from the Mental [facility] Association as he shared his story of dealing with mental health/substance abuse issues and overcoming it by working a recovery program.  Patient expressed interest in their programs and services and received information on their agency.  She shared she would like to attend Saturday programming.  [name], [name] 07/11/2014

## 2014-07-11 NOTE — Progress Notes (Signed)
Pt is new to the unit this afternoon.  Pt reports she is dealing with some stressful situations at home and it reached the point that she was having suicidal thoughts.  She is denying SI now, but still feeling hopeless and helpless.  She denies HI/AV at this time.  Pt has been observed in the dayroom watching TV with minimal interaction with others on the unit.  Pt was encouraged to make her needs and concerns known to staff.  Pt voiced understanding.  Writer discussed meds available to her this evening.  Support and encouragement offered.  Safety maintained with q15 minute checks.

## 2014-07-11 NOTE — [facility] Group Notes (Signed)
[facility] Group Notes:  (Nursing/MHT/Case Management/Adjunct)  Date:  07/11/2014  Time:  0900am  Type of Therapy:  Leisure and Lifestyle Changes group  Participation Level:  Did Not Attend  Participation Quality:  Did not attend  Affect:  Did not attend  Cognitive:  Did not attend  Insight:  None  Engagement in Group:  Did not attend  Modes of Intervention:  Leisure activity recommendations  Summary of Progress/Problems: Pt attempted to attend group but was pulled out to speak with social worker and was unable to participate.  Lendell CapriceGuthrie, Glendon Dunwoody A 07/11/2014, 9:59 AM

## 2014-07-11 NOTE — Progress Notes (Signed)
Pt attended karaoke group this evening. Pt even performed 2 song selections.

## 2014-07-11 NOTE — Progress Notes (Signed)
D: Patient is alert and oriented. Pt's mood and affect is animated, blunted, but pleasant upon interaction. Pt rates her depression and hopelessness 5/10 today. Pt denies SI/HI and AVH at this time but reports she has "not yet" had suicidal thoughts this morning. Pt reports her goal for the day is "finding out what I can do to reduce my depression." Pt is attending groups. A: Pt encouraged to come to RN if SI thoughts present. Encouragement/Support provided to pt. Scheduled medications administered per providers orders (See MAR). 15 minute checks completed per protocol for patient safety.  R: Pt cooperative and receptive to nursing interventions.

## 2014-07-11 NOTE — Plan of Care (Signed)
Problem: Ineffective individual coping Goal: STG: Patient will remain free from self harm Outcome: Progressing Patient remains free from self harm. 15 minute checks completed per protocol for patient safety.   Problem: Diagnosis: Increased Risk For Suicide Attempt Goal: LTG-Patient Will Report Improved Mood and Deny Suicidal LTG (by discharge) Patient will report improved mood and deny suicidal ideation.  Outcome: Progressing Patient denies suicidal ideation and is observed to have appropriate affect and mood upon interaction. Goal: STG-Patient Will Attend All Groups On The Unit Outcome: Progressing Patient is attending all unit groups today. Goal: STG-Patient Will Comply With Medication Regime Outcome: Progressing Patient has complied with medication regimen today.  Comments:  Alena BillsGuthrie, Jaja Switalski A, RN

## 2014-07-11 NOTE — B[facility] Suicide Risk Assessment (Addendum)
B[facility] INPATIENT:  Family/Significant Other Suicide Prevention Education  Suicide Prevention Education:  Education Completed; Julia Dickerson, Husband, [phone]; has been identified by the patient as the family member/significant other with whom the patient will be residing, and identified as the person(s) who will aid the patient in the event of a mental health crisis (suicidal ideations/suicide attempt).  With written consent from the patient, the family member/significant other has been provided the following suicide prevention education, prior to the and/or following the discharge of the patient.  The suicide prevention education provided includes the following:  Suicide risk factors  Suicide prevention and interventions  National Suicide Hotline telephone number  [facility] assessment telephone number  [facility] [facility] Emergency Assistance 911  [facility] and/or Residential Mobile Crisis Unit telephone number  Request made of family/significant other to:  Remove weapons (e.g., guns, rifles, knives), all items previously/currently identified as safety concern.   Husband advised patient does not have access to weapons.    Remove drugs/medications (over-the-counter, prescriptions, illicit drugs), all items previously/currently identified as a safety concern.  The family member/significant other verbalizes understanding of the suicide prevention education information provided.  The family member/significant other agrees to remove the items of safety concern listed above.  Julia Dickerson, Julia Dickerson 07/11/2014, 10:38 AM

## 2014-07-11 NOTE — Progress Notes (Signed)
D: Patient pleasant and cooperative. Denies pain, SI, AH/VH at this time. Patient made no complaint tonight. Husband visited.  A: Patient encouraged to verbalize needs to staff. Support and encouragement given. Safety maintained. R: Patient very receptive. Will continue to monitor patient.

## 2014-07-12 LAB — GLUCOSE, CAPILLARY: Glucose-Capillary: 201 mg/dL — ABNORMAL HIGH (ref 70–99)

## 2014-07-12 MED ORDER — FAMOTIDINE 20 MG PO TABS
20.0000 mg | ORAL_TABLET | Freq: Two times a day (BID) | ORAL | Status: DC
  Administered 2014-07-12 – 2014-07-13 (×2): 20 mg via ORAL
  Filled 2014-07-12 (×6): qty 1
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
## 2014-07-12 NOTE — Progress Notes (Signed)
Patient did not attend group tonight 

## 2014-07-12 NOTE — Progress Notes (Signed)
Patient was seen watching TV and interacting with peers. Patient stated that she is looking forward to be discharged tomorrow. Denies pain, SI, AH/VH at this time. No new complaint. Will continue to monitor patient.

## 2014-07-12 NOTE — BHH Group Notes (Deleted)
B[facility] LCSW Group Therapy  Feelings Around Relapse  07/12/2014 3:45 PM  Type of Therapy:  Group Therapy  Participation Level:  Did Not Attend - patient was in bed sleeping and did not want to get up.   [name], Ericka Marcellus Hairston 07/12/2014, 3:45 PM

## 2014-07-12 NOTE — Clinical Social Work Note (Deleted)
CSW met with patient to complete PSA.  Patient was very depressed and tearful.  Patient shared she is sad because she does not know where her son is or how to get in contact with his father.  Patient shared she has two other children one who lives with his father and a daughter who lives with her mother.  She advised her parental rights to her daughter have been terminated.

## 2014-07-12 NOTE — [facility] Group Notes (Signed)
[facility]   07/12/2014 11:00 AM    Participation Quality:  Appropraite  Mood/Affect:  Appropriate  Depression Rating:  1  Anxiety Rating:  1  Thoughts of Suicide:  No  Will you contract for safety?   NA  Current AVH:  No  Plan for Discharge/Comments:  Patient attended discharge planning group and actively participated in group. She reports feeling much better today and hopes to discharge soon. [facility].  Suicide prevention education reviewed and SPE document provided.   Transportation Means: Patient has transportation.   Supports:  Patient has a support system.   Tasheba Henson, Joesph JulyQuylle Hairston

## 2014-07-12 NOTE — Progress Notes (Signed)
[facility]  07/12/2014 1:49 PM Julia Dickerson  MRN:  [id number] Subjective:  " I am feeling better". " I hope I'll be going home soon" Objective: Patient states she feels better today, and presents with improved mood and range of affect. Denies any severe neurovegetative symptoms of depression at this time. Sleep has been fair, appetite "OK". States she had visit from husband last night and that visit went well. Patient remains concerned about her psychosocial stressors, but less ruminative than upon admission, and feeling more optimistic that she will be able to address them . She states she is feeling more assertive now that she is feeling better, and plans to set some limits when she gets home to her husband and friends. States her husband " has been going back and forth about whether we will stay together with our friends or get a place of our own" and states " I have become the go between for my friends and my husband", which she finds very stressful. As above, she plans to discuss these concerns with her husband and friends and states " I feel I have my  Voice back  and that what I say will matter". No disruptive behaviors on unit.  Denies medication side effects. She is visible in day room, going to groups.   Diagnosis:    Total Time spent with patient: 25 minutes     ADL's:  Improved  Sleep: fair   Appetite:  Good  Suicidal Ideation:  Denies any suicidal ideations Homicidal Ideation:  Denies any homicidal ideations AEB (as evidenced by):  Psychiatric Specialty Exam: Physical Exam  ROS  Blood pressure 141/66, pulse 100, temperature 98.9 F (37.2 C), temperature source Oral, resp. rate 18, height 5\' 8"  (1.727 m), weight 112 kg (246 lb 14.6 oz), last menstrual period 05/06/2014.Body mass index is 37.55 kg/(m^2).  General Appearance: Fairly Groomed  Patent attorneyye Contact::  Good  Speech:  Normal Rate  Volume:  Normal  Mood:  improved, less depressed  Affect:  Appropriate   Thought Process:  Linear  Orientation:  Full (Time, Place, and Person)  Thought Content:  denies hallucinations, no delusions, not internally preoccupied, less ruminative about stressors  Suicidal Thoughts:  No At this time denies any suicidal plan or intent and contracts for safety on the unit  Homicidal Thoughts:  No  Memory:   Recent and remote grossly intact   Judgement:  Fair  Insight:  Fair  Psychomotor Activity:  Normal  Concentration:  Good  Recall:  Good  Fund of Knowledge:Good  Language: Good  Akathisia:  Negative  Handed:  Right  AIMS (if indicated):     Assets:  Desire for Improvement Resilience  Sleep:  Number of Hours: 5.25   Musculoskeletal: Strength & Muscle Tone: within normal limits Gait & Station: normal Patient leans: N/A  Current Medications: Current Facility-Administered Medications  Medication Dose Route Frequency Provider Last Rate Last Dose  . acetaminophen (TYLENOL) tablet 650 mg  650 mg Oral Q6H PRN Fransisca KaufmannLaura Davis, NP      . alum & mag hydroxide-simeth (MAALOX/MYLANTA) 200-200-20 MG/5ML suspension 30 mL  30 mL Oral Q4H PRN Fransisca KaufmannLaura Davis, NP   30 mL at 07/12/14 [id number]  . atorvastatin (LIPITOR) tablet 20 mg  20 mg Oral Daily Fransisca KaufmannLaura Davis, NP   20 mg at 07/12/14 0810  . buPROPion (WELLBUTRIN XL) 24 hr tablet 150 mg  150 mg Oral Daily Craige CottaFernando A Cobos, MD   150 mg at 07/12/14 0810  .  escitalopram (LEXAPRO) tablet 10 mg  10 mg Oral Daily Craige CottaFernando A Cobos, MD   10 mg at 07/12/14 0810  . ibuprofen (ADVIL,MOTRIN) tablet 200 mg  200 mg Oral Q6H PRN Fransisca KaufmannLaura Davis, NP      . lisinopril (PRINIVIL,ZESTRIL) tablet 10 mg  10 mg Oral Daily Fransisca KaufmannLaura Davis, NP   10 mg at 07/12/14 0810  . magnesium hydroxide (MILK OF MAGNESIA) suspension 30 mL  30 mL Oral Daily PRN Fransisca KaufmannLaura Davis, NP      . metFORMIN (GLUCOPHAGE) tablet 1,000 mg  1,000 mg Oral BID WC Fransisca KaufmannLaura Davis, NP   1,000 mg at 07/12/14 0810  . traZODone (DESYREL) tablet 50 mg  50 mg Oral QHS PRN Fransisca KaufmannLaura Davis, NP   50 mg at 07/10/14  2156    Lab Results:  Results for orders placed or performed during the hospital encounter of 07/10/14 (from the past 48 hour(s))  Glucose, capillary     Status: Abnormal   Collection Time: 07/11/14  7:53 AM  Result Value Ref Range   Glucose-Capillary 284 (H) 70 - 99 mg/dL  Glucose, capillary     Status: Abnormal   Collection Time: 07/12/14  6:05 AM  Result Value Ref Range   Glucose-Capillary 201 (H) 70 - 99 mg/dL   Comment 1 Notify RN    Comment 2 Documented in Chart     Physical Findings: AIMS: Facial and Oral Movements Muscles of Facial Expression: None, normal Lips and Perioral Area: None, normal Jaw: None, normal Tongue: None, normal,Extremity Movements Upper (arms, wrists, hands, fingers): None, normal Lower (legs, knees, ankles, toes): None, normal, Trunk Movements Neck, shoulders, hips: None, normal, Overall Severity Severity of abnormal movements (highest score from questions above): None, normal Incapacitation due to abnormal movements: None, normal Patient's awareness of abnormal movements (rate only patient's report): No Awareness, Dental Status Current problems with teeth and/or dentures?: No Does patient usually wear dentures?: No  CIWA:    COWS:      Assessment: Patient is improved compared to admission. At this time presenting with improved mood and today seems euthymic, with a full range of affect. No suicidal or homicidal ideaitons She is less ruminative, and feeling more optimistic about being able to work through and address her stressors effectively. She is tolerating medications well.   Treatment Plan Summary: Daily contact with patient to assess and evaluate symptoms and progress in treatment Medication management See below  Plan: Continue inpatient treatment and support  Consider Discharge Soon as she continues to improve  Continue Wellbutrin XL 150 mgrs QAM Continue Lexapro 10 mgrs QAM  Medical Decision Making Problem Points:  Established  problem, stable/improving (1), Review of last therapy session (1) and Review of psycho-social stressors (1) Data Points:  Review or order clinical lab tests (1) Review of medication regiment & side effects (2)  I certify that inpatient services furnished can reasonably be expected to improve the patient's condition.   COBOS, FERNANDO 07/12/2014, 1:49 PM

## 2014-07-12 NOTE — Progress Notes (Signed)
Patient ID: Julia Dickerson, female   DOB: Apr 09, 1976, 38 y.o.   MRN: [id number]  D: Patient reports that mood is stable at present. Gives depression "1" on scale and anxiety and hopelessness "0". Currently denies any SI at present. Reports she has been in a hospital for a couple of days now.  A: Staff will monitor on q 15 minute checks, follow treatment plan, and give medications as ordered. R: Cooperative on the unit.

## 2014-07-12 NOTE — Progress Notes (Signed)
Recreation Therapy Notes  Animal-Assisted Activity/Therapy (AAA/T) Program Checklist/Progress Notes Patient Eligibility Criteria Checklist & Daily Group note for Rec Tx Intervention  Date: 12.17.2015 Time: 2:45pm Location: [address] Dayroom   AAA/T Program Assumption of Risk Form signed by Patient/ or Parent Legal Guardian yes  Patient is free of allergies or sever asthma yes  Patient reports no fear of animals yes  Patient reports no history of cruelty to animals yes  Patient understands his/her participation is voluntary yes  Patient washes hands before animal contact yes  Patient washes hands after animal contact yes  Behavioral Response: Appropriate   Education: Hand Washing, Appropriate Animal Interaction   Education Outcome: Acknowledges education.   Clinical Observations/Feedback: Patient actively engaged with therapy dog, petting him appropriately and feeding him a treat in exchange for completion of basic obedience commands. Patient shared stories about her pets at home with group.   Marykay Lexenise L Nathian Stencil, LRT/CTRS  Glynda Soliday L 07/12/2014 10:10 AM

## 2014-07-13 DIAGNOSIS — F332 Major depressive disorder, recurrent severe without psychotic features: Principal | ICD-10-CM

## 2014-07-13 LAB — GLUCOSE, CAPILLARY: Glucose-Capillary: 142 mg/dL — ABNORMAL HIGH (ref 70–99)

## 2014-07-13 MED ORDER — ESCITALOPRAM OXALATE 10 MG PO TABS: 10.0000 mg | ORAL_TABLET | Freq: Every day | ORAL | Status: DC

## 2014-07-13 MED ORDER — TRAZODONE HCL 50 MG PO TABS: 50.0000 mg | ORAL_TABLET | Freq: Every evening | ORAL | Status: DC | PRN

## 2014-07-13 MED ORDER — METFORMIN HCL 1000 MG PO TABS: 1000.0000 mg | ORAL_TABLET | Freq: Two times a day (BID) | ORAL | Status: DC

## 2014-07-13 MED ORDER — ATORVASTATIN CALCIUM 20 MG PO TABS: 20.0000 mg | ORAL_TABLET | Freq: Every day | ORAL | Status: DC

## 2014-07-13 MED ORDER — BUPROPION HCL ER (XL) 150 MG PO TB24: 150.0000 mg | ORAL_TABLET | Freq: Every day | ORAL | Status: DC

## 2014-07-13 MED ORDER — LISINOPRIL 10 MG PO TABS: 10.0000 mg | ORAL_TABLET | Freq: Every day | ORAL | Status: DC

## 2014-07-13 NOTE — [facility] Group Notes (Signed)
[facility] LCSW Group Therapy Note  07/13/2014 1:15 PM   Type of Therapy and Topic:  Group Therapy: Avoiding Self-Sabotaging and Enabling Behaviors  Participation Level:  Active  Mood: Appropriate  Description of Group:     Learn how to identify obstacles, self-sabotaging and enabling behaviors, what are they, why do we do them and what needs do these behaviors meet? Discuss unhealthy relationships and how to have positive healthy boundaries with those that sabotage and enable. Explore aspects of self-sabotage and enabling in yourself and how to limit these self-destructive behaviors in everyday life. A scaling question is used to help patient look at where they are now in their motivation to change, from 1 to 10 (lowest to highest motivation).  Therapeutic Goals: 1. Patient will identify one obstacle that relates to self-sabotage and enabling behaviors 2. Patient will identify one personal self-sabotaging or enabling behavior they did prior to admission 3. Patient able to establish a plan to change the above identified behavior they did prior to admission:  4. Patient will demonstrate ability to communicate their needs through discussion and/or role plays.   Summary of Patient Progress:  Summary of Progress/Problems: The main focus of today's process group was for the patient to identify ways in which they have in the past sabotaged their own recovery. Motivational Interviewing was utilized to ask the group members what they get out of their substance use, isolation, noncompliance, self harm, negative self talk, etc and what reasons they may have for wanting to change. The Stages of Change were explained using a handout, and patients identified where they currently are with regard to stages of change. The patient expressed that she self sabotages in many ways. Upon processing the different ways she was able to state that she "often fails to advocate for myself. I will say things are okay when they  are not just to avoid rocking the boat or maybe angering someone else." Patient liked the concept of visualizing her nephew in her own circumstances "because I would stand up for him at any cost." Patient reports she is between the stages of contemplation and determination.     Therapeutic Modalities:   Cognitive Behavioral Therapy Person-Centered Therapy Motivational Interviewing   Carney Bernatherine C Demetrie Borge, LCSW

## 2014-07-13 NOTE — [facility] Suicide Risk Assessment (Signed)
Suicide Risk Assessment  Discharge Assessment     Demographic Factors:  Caucasian  Total Time spent with patient: 30 minutes  Psychiatric Specialty Exam:     Blood pressure 126/89, pulse 90, temperature 98.7 F (37.1 C), temperature source Oral, resp. rate 22, height 5\' 8"  (1.727 m), weight 112 kg (246 lb 14.6 oz), last menstrual period 05/06/2014.Body mass index is 37.55 kg/(m^2).  General Appearance: Fairly Groomed  Patent attorneyye Contact::  Fair  Speech:  Clear and Coherent  Volume:  Normal  Mood:  Euthymic  Affect:  Appropriate  Thought Process:  Coherent and Goal Directed  Orientation:  Full (Time, Place, and Person)  Thought Content:  plans as she moves on  Suicidal Thoughts:  No  Homicidal Thoughts:  No  Memory:  Immediate;   Fair Recent;   Fair Remote;   Fair  Judgement:  Fair  Insight:  Present  Psychomotor Activity:  Normal  Concentration:  Fair  Recall:  Fis[employer]Fair  Fund of Knowledge:Fair  Language: Fair  Akathisia:  No  Handed:    AIMS (if indicated):     Assets:  Desire for Improvement Housing Social Support  Sleep:  Number of Hours: 6    Musculoskeletal: Strength & Muscle Tone: within normal limits Gait & Station: normal Patient leans: N/A   Mental Status Per Nursing Assessment::   On Admission:     Current Mental Status by Physician: In full contact with reality. There are no active SI plans or intent. States she feels much better and she feels ready to be D/C home. She states her husband just got a job as he lost the last one as he has sleep apnea, he is a Naval architecttruck driver, and they could not afford to buy a CPAP machine. States that they live with roommates that are supportive and were instrumental in encouraging her to get some help. They became the main source of support as her husband was gone most of the time.    Loss Factors: NA  Historical Factors: NA  Risk Reduction Factors:   Living with another person, especially a relative and Positive social  support  Continued Clinical Symptoms:  Depression:   Severe  Cognitive Features That Contribute To Risk:  Closed-mindedness Polarized thinking Thought constriction (tunnel vision)    Suicide Risk:  Minimal: No identifiable suicidal ideation.  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 Hypertension   . Diabetes mellitus without complication   . UTI (lower urinary tract infection)    AXIS IV:  other psychosocial or environmental problems AXIS V:  61-70 mild symptoms  Plan Of Care/Follow-up recommendations:  Activity:  as tolerated Diet:  regular Follow up outpatient basis Is patient on multiple antipsychotic therapies at discharge:  No   Has Patient had three or more failed trials of antipsychotic monotherapy by history:  No  Recommended Plan for Multiple Antipsychotic Therapies: NA    Rockelle Heuerman A 07/13/2014, 3:25 PM

## 2014-07-13 NOTE — Progress Notes (Signed)
D) Pt being discharged to home accompanied by her family. Affect and mood area appropriate. Pt denies SI and HI Rates her depression, hopelessness and helplessness and anxiety all at a 0. Pt denies SI and HI. A) Given support and provided with a 1:1. All belongings returned to Pt. All discharge plans explained to Pt and she was able to repeat the instructions back to this Clinical research associatewriter. Pt given support, reassurance and understanding. All medications explained to Pt. R) Pt denies SI and HI

## 2014-07-13 NOTE — Discharge Summary (Signed)
Physician Discharge Summary Note  Patient:  Julia Dickerson is an 38 y.o., female MRN:  [id number] DOB:  [birth year] Patient phone:  [phone] (home)  Patient address:   [address] [place] [id number],  Total Time spent with patient: 30 minutes  Date of Admission:  07/10/2014 Date of Discharge: 07/13/14  Reason for Admission:  Mood stabilization treatments  Discharge Diagnoses: Active Problems:   Major depression, recurrent  Psychiatric Specialty Exam: Physical Exam  Review of Systems  Constitutional: Negative.   HENT: Negative.   Eyes: Negative.   Respiratory: Negative.   Cardiovascular: Negative.   Gastrointestinal: Negative.   Genitourinary: Negative.   Musculoskeletal: Negative.   Skin: Negative.   Neurological: Negative.   Endo/Heme/Allergies: Negative.   Psychiatric/Behavioral: Positive for depression (Stable ). Negative for suicidal ideas.    Blood pressure 126/89, pulse 90, temperature 98.7 F (37.1 C), temperature source Oral, resp. rate 22, height 5\' 8"  (1.727 m), weight 112 kg (246 lb 14.6 oz), last menstrual period 05/06/2014.Body mass index is 37.55 kg/(m^2).       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 Hypertension   . Diabetes mellitus without complication   . UTI (lower urinary tract infection)    AXIS IV: other psychosocial or environmental problems AXIS V: 61-70 mild symptoms  Level of Care:  OP  Hospital Course:  Julia Dickerson is an 38 y.o. female. Pt presents to [facility] with C/O increased depression and SI with  thoughts and plans of cutting herself with a blade or suffocating herself with a pillow. Pt reports that she has battled depression for years but never received any treatment. Pt reports that she had a conversation with her husband yesterday and the conversation did not go well. Pt reports that her husband recently loss his job in November 2015, and could not continue his employment as a Naval architecttruck driver because they could not afford his cpap machine. Pt reports mood instability as evidenced by irritable mood and depression. Pt reports on-going conflict with her mother who she states did not support [place] in 2014. Pt denies illicit substance use. Pt denies history of aggression or violence. Pt denies HI and no AVH reported.         Julia Dickerson was admitted to the adult unit where she was evaluated and her symptoms were identified. Medication management was discussed and implemented. Her Wellbutrin was changed to the XL version at 150 mg daily for improved treatment of her depression. Her home medication of Lexapro 10 mg daily was continued.  She was encouraged to participate in unit programming. Medical problems were identified and treated appropriately. Home medication was restarted as needed.  She was evaluated each day by a clinical provider to ascertain the patient's response to treatment.  Improvement was noted by the patient's report of decreasing symptoms, improved sleep and appetite, affect, medication tolerance, behavior, and participation in unit programming.  The patient was asked each day to complete a self inventory noting mood, mental status, pain, new symptoms, anxiety and concerns.         She responded well to  medication and being in a therapeutic and supportive environment. Positive and appropriate behavior was noted and the patient was motivated for recovery.  She worked closely with the treatment team and case manager to develop a discharge plan with  appropriate goals. Coping skills, problem solving as well as relaxation therapies were also part of the unit programming. Patient was noted to ruminate less about her psychosocial stressors. She planned to communicate more assertively with her husband after discharge about establishing some boundaries with their friends.          By the day of discharge she was in much improved condition than upon admission.  Symptoms were reported as significantly decreased or resolved completely.  The patient denied SI/HI and voiced no AVH. She was motivated to continue taking medication with a goal of continued improvement in mental health. Julia Dickerson was discharged home with a plan to follow up as noted below. Patient was provided with medication samples and prescriptions at time of discharge. She left [facility] in stable condition with all items returned to her.   Consults:  None  Significant Diagnostic Studies:  Chemistry panel, CBC, UDS negative, Daily blood glucose monitoring   Discharge Vitals:   Blood pressure 126/89, pulse 90, temperature 98.7 F (37.1 C), temperature source Oral, resp. rate 22, height 5\' 8"  (1.727 m), weight 112 kg (246 lb 14.6 oz), last menstrual period 05/06/2014. Body mass index is 37.55 kg/(m^2). Lab Results:   Results for orders placed or performed during the hospital encounter of 07/10/14 (from the past 72 hour(s))  Glucose, capillary     Status: Abnormal   Collection Time: 07/11/14  7:53 AM  Result Value Ref Range   Glucose-Capillary 284 (H) 70 - 99 mg/dL  Glucose, capillary     Status: Abnormal   Collection Time: 07/12/14  6:05 AM  Result Value Ref Range   Glucose-Capillary 201 (H) 70 - 99 mg/dL   Comment 1 Notify RN    Comment 2 Documented in Chart   Glucose, capillary     Status: Abnormal   Collection Time: 07/13/14  6:13 AM  Result Value Ref Range   Glucose-Capillary 142 (H) 70 - 99 mg/dL    Physical Findings: AIMS: Facial and Oral Movements Muscles of Facial  Expression: None, normal Lips and Perioral Area: None, normal Jaw: None, normal Tongue: None, normal,Extremity Movements Upper (arms, wrists, hands, fingers): None, normal Lower (legs, knees, ankles, toes): None, normal, Trunk Movements Neck, shoulders, hips: None, normal, Overall Severity Severity of abnormal movements (highest score from questions above): None, normal Incapacitation due to abnormal movements: None, normal Patient's awareness of abnormal movements (rate only patient's report): No Awareness, Dental Status Current problems with teeth and/or dentures?: No Does patient usually wear dentures?: No  CIWA:    COWS:     Psychiatric Specialty Exam: See Psychiatric Specialty Exam and Suicide Risk Assessment completed by Attending Physician prior to discharge.  Discharge destination:  Home  Is patient on multiple antipsychotic therapies at discharge:  No   Has Patient had three or more failed trials of antipsychotic monotherapy by history:  No  Recommended Plan for Multiple Antipsychotic Therapies: NA      Discharge Instructions    Discharge instructions    Complete by:  As directed   Please follow up with your Primary Care Provider for further management of chronic medical problem such as Diabetes and elevated cholesterol.            Medication List    STOP  taking these medications        buPROPion 150 MG 12 hr tablet  Commonly known as:  WELLBUTRIN SR  Replaced by:  buPROPion 150 MG 24 hr tablet     cephALEXin 500 MG capsule  Commonly known as:  KEFLEX     diphenhydrAMINE 25 MG tablet  Commonly known as:  BENADRYL     phenazopyridine 200 MG tablet  Commonly known as:  PYRIDIUM      TAKE these medications      Indication   atorvastatin 20 MG tablet  Commonly known as:  LIPITOR  Take 1 tablet (20 mg total) by mouth daily.   Indication:  Elevation of Both Cholesterol and Triglycerides in Blood     buPROPion 150 MG 24 hr tablet  Commonly known as:   WELLBUTRIN XL  Take 1 tablet (150 mg total) by mouth daily.   Indication:  Major Depressive Disorder     escitalopram 10 MG tablet  Commonly known as:  LEXAPRO  Take 1 tablet (10 mg total) by mouth daily.   Indication:  Depression     ibuprofen 200 MG tablet  Commonly known as:  ADVIL,MOTRIN  Take 200 mg by mouth every 6 (six) hours as needed for mild pain.      lisinopril 10 MG tablet  Commonly known as:  PRINIVIL,ZESTRIL  Take 1 tablet (10 mg total) by mouth daily.   Indication:  High Blood Pressure     metFORMIN 1000 MG tablet  Commonly known as:  GLUCOPHAGE  Take 1 tablet (1,000 mg total) by mouth 2 (two) times daily with a meal.   Indication:  Type 2 Diabetes     traZODone 50 MG tablet  Commonly known as:  DESYREL  Take 1 tablet (50 mg total) by mouth at bedtime as needed for sleep.   Indication:  Trouble Sleeping       Follow-up Information    Follow up with Nathen May - [facility] On 07/22/2014.   Why:  Monday, July 22, 2014 at 4:0-0 PM   Contact information:   [address] [place], [place]  [address]  [phone]      Follow up with Dr. Lolly Mustache - [facility] On 08/16/2014.   Why:  Friday, August 16, 2013 at [facility]. Please arrive at 8:30 AM with completed registration packet.   Contact information:   [address] [place], [place]   [address]  [phone]     Follow-up recommendations:    Activity: as tolerated Diet: regular Follow up outpatient basis  Comments:   Take all your medications as prescribed by your mental healthcare provider.  Report any adverse effects and or reactions from your medicines to your outpatient provider promptly.  Patient is instructed and cautioned to not engage in alcohol and or illegal drug use while on prescription medicines.  In the event of worsening symptoms, patient is instructed to call the crisis hotline, 911 and or go to the nearest ED for appropriate evaluation and treatment of  symptoms.  Follow-up with your primary care provider for your other medical issues, concerns and or health care needs.   Total Discharge Time:  Greater than 30 minutes.  SignedFransisca Kaufmann NP-C 07/13/2014, 4:33 PM  I personally assessed the patient and formulated the plan Madie Reno A. Dub Mikes, M.D.

## 2014-07-16 NOTE — Progress Notes (Signed)
Patient Discharge Instructions:  After Visit Summary (AVS):   Faxed to:  07/16/14 Discharge Summary Note:   Faxed to:  07/16/14 Psychiatric Admission Assessment Note:   Faxed to:  07/16/14 Suicide Risk Assessment - Discharge Assessment:   Faxed to:  07/16/14 Faxed/Sent to the Next Level Care provider:  07/16/14 Next Level Care Provider Has Access to the EMR, 07/16/14  F[facility] to [facility] @ [phone] Records provided to [facility] via [facility]/Epic access.  Jerelene ReddenSheena E [place], 07/16/2014, 4:00 PM

## 2014-08-16 ENCOUNTER — Ambulatory Visit (HOSPITAL_COMMUNITY): Payer: Self-pay | Admitting: Psychiatry

## 2014-09-27 ENCOUNTER — Emergency Department (HOSPITAL_COMMUNITY)
Admission: EM | Admit: 2014-09-27 | Discharge: 2014-09-27 | Disposition: A | Discharge status: Nursing Home (Includes ICF) | Payer: 59 | Source: Home / Self Care | Attending: Family Medicine | Admitting: Family Medicine

## 2014-09-27 ENCOUNTER — Encounter (HOSPITAL_COMMUNITY): Payer: Self-pay | Admitting: *Deleted

## 2014-09-27 ENCOUNTER — Encounter (HOSPITAL_COMMUNITY): Payer: Self-pay | Admitting: Emergency Medicine

## 2014-09-27 ENCOUNTER — Emergency Department (HOSPITAL_COMMUNITY)
Admission: EM | Admit: 2014-09-27 | Discharge: 2014-09-27 | Disposition: A | Discharge status: Home / Self Care | Payer: 59 | Attending: Emergency Medicine | Admitting: Emergency Medicine

## 2014-09-27 DIAGNOSIS — F329 Major depressive disorder, single episode, unspecified: Secondary | ICD-10-CM | POA: Insufficient documentation

## 2014-09-27 DIAGNOSIS — E119 Type 2 diabetes mellitus without complications: Secondary | ICD-10-CM | POA: Insufficient documentation

## 2014-09-27 DIAGNOSIS — Z87891 Personal history of nicotine dependence: Secondary | ICD-10-CM | POA: Insufficient documentation

## 2014-09-27 DIAGNOSIS — I10 Essential (primary) hypertension: Secondary | ICD-10-CM | POA: Diagnosis not present

## 2014-09-27 DIAGNOSIS — Z79899 Other long term (current) drug therapy: Secondary | ICD-10-CM | POA: Insufficient documentation

## 2014-09-27 DIAGNOSIS — M546 Pain in thoracic spine: Secondary | ICD-10-CM | POA: Diagnosis not present

## 2014-09-27 DIAGNOSIS — R079 Chest pain, unspecified: Secondary | ICD-10-CM | POA: Insufficient documentation

## 2014-09-27 DIAGNOSIS — R0789 Other chest pain: Secondary | ICD-10-CM

## 2014-09-27 DIAGNOSIS — Z87440 Personal history of urinary (tract) infections: Secondary | ICD-10-CM | POA: Diagnosis not present

## 2014-09-27 DIAGNOSIS — M25511 Pain in right shoulder: Secondary | ICD-10-CM | POA: Insufficient documentation

## 2014-09-27 HISTORY — DX: Major depressive disorder, single episode, unspecified: F32.9

## 2014-09-27 HISTORY — DX: Depression, unspecified: F32.A

## 2014-09-27 HISTORY — DX: Polycystic ovarian syndrome: E28.2

## 2014-09-27 MED ORDER — MELOXICAM 7.5 MG PO TABS: 7.5000 mg | ORAL_TABLET | Freq: Every day | ORAL | Status: DC | PRN

## 2014-09-27 MED ORDER — IBUPROFEN 400 MG PO TABS
600.0000 mg | ORAL_TABLET | Freq: Once | ORAL | Status: AC
  Administered 2014-09-27: 600 mg via ORAL
  Filled 2014-09-27 (×2): qty 1

## 2014-09-27 MED ORDER — TRAMADOL HCL 50 MG PO TABS: 50.0000 mg | ORAL_TABLET | Freq: Four times a day (QID) | ORAL | Status: DC | PRN

## 2014-09-27 MED ORDER — OXYCODONE-ACETAMINOPHEN 5-325 MG PO TABS
2.0000 | ORAL_TABLET | Freq: Once | ORAL | Status: AC
  Administered 2014-09-27: 2 via ORAL
  Filled 2014-09-27: qty 2

## 2014-09-27 NOTE — Discharge Instructions (Signed)

## 2014-09-27 NOTE — ED Notes (Signed)
[facility] for eval of right sided shoulder pain into back and neck x several days

## 2014-09-27 NOTE — ED Notes (Signed)
Pt is here with complaints of 2 day history of right shoulder pain without any known injury or associated symptoms.

## 2014-09-27 NOTE — ED Provider Notes (Signed)
CSN: [id number]     Arrival date & time 09/27/14  0919 History   First MD Initiated Contact with Patient 09/27/14 1012     Chief Complaint  Patient presents with  . Shoulder Pain   (Consider location/radiation/quality/duration/timing/severity/associated sxs/prior Treatment) HPI        39 year old female presents complaining of right-sided chest and back pain radiating to her right shoulder. This started 2 days ago. It is worse with any activity and also worse with arm movements. She says the chest and shoulder are not tender, she feels the pain inside her chest. She also feels short of breath and the pain is made worse with taking a deep breath. She denies any activity that may have caused this. No leg pain or swelling. No NVD. No recent travel. No cardiac history apart from some palpitations, although she has a very strong family history of coronary artery disease at a young age. She denies any pain in the shoulder itself.  Past Medical History  Diagnosis Date  . Hypertension   . Diabetes mellitus without complication   . UTI (lower urinary tract infection)   . Depression   . PCOS (polycystic ovarian syndrome)    Past Surgical History  Procedure Laterality Date  . Dilation and curettage of uterus    . Wisdom tooth extraction     History reviewed. No pertinent family history. History  Substance Use Topics  . Smoking status: Former Smoker -- 1.00 packs/day    Types: Cigarettes  . Smokeless tobacco: Not on file     Comment: vapor  . Alcohol Use: Yes     Comment: rare   OB History    No data available     Review of Systems  Constitutional: Positive for fatigue. Negative for fever.  Eyes: Negative for visual disturbance.  Respiratory: Positive for shortness of breath.   Cardiovascular: Positive for chest pain. Negative for leg swelling.  Gastrointestinal: Negative for nausea, vomiting, abdominal pain and diarrhea.  Skin: Negative for rash.  Neurological: Negative for dizziness,  weakness and light-headedness.  All other systems reviewed and are negative.   Allergies  Sulfa antibiotics  Home Medications   Prior to Admission medications   Medication Sig Start Date End Date Taking? Authorizing Provider  atorvastatin (LIPITOR) 20 MG tablet Take 1 tablet (20 mg total) by mouth daily. 07/13/14   Fransisca KaufmannLaura Davis, NP  buPROPion (WELLBUTRIN XL) 150 MG 24 hr tablet Take 1 tablet (150 mg total) by mouth daily. 07/13/14   Fransisca KaufmannLaura Davis, NP  escitalopram (LEXAPRO) 10 MG tablet Take 1 tablet (10 mg total) by mouth daily. 07/13/14   Fransisca KaufmannLaura Davis, NP  ibuprofen (ADVIL,MOTRIN) 200 MG tablet Take 200 mg by mouth every 6 (six) hours as needed for mild pain.    Historical Provider, MD  lisinopril (PRINIVIL,ZESTRIL) 10 MG tablet Take 1 tablet (10 mg total) by mouth daily. 07/13/14   Fransisca KaufmannLaura Davis, NP  metFORMIN (GLUCOPHAGE) 1000 MG tablet Take 1 tablet (1,000 mg total) by mouth 2 (two) times daily with a meal. 07/13/14   Fransisca KaufmannLaura Davis, NP  traZODone (DESYREL) 50 MG tablet Take 1 tablet (50 mg total) by mouth at bedtime as needed for sleep. 07/13/14   Fransisca KaufmannLaura Davis, NP   BP 127/84 mmHg  Pulse 88  Temp(Src) 98.1 F (36.7 C) (Oral)  Resp 16  SpO2 100%  LMP 07/29/2014 Physical Exam  Constitutional: She is oriented to person, place, and time. Vital signs are normal. She appears well-developed and well-nourished. No distress.  HENT:  Head: Normocephalic and atraumatic.  Right Ear: External ear normal.  Left Ear: External ear normal.  Nose: Nose normal.  Mouth/Throat: Oropharynx is clear and moist. No oropharyngeal exudate.  Eyes: Conjunctivae are normal. Right eye exhibits no discharge. Left eye exhibits no discharge.  Cardiovascular: Normal rate, regular rhythm and normal heart sounds.   Pulmonary/Chest: Effort normal and breath sounds normal. No respiratory distress. She has no wheezes. She has no rales.  Neurological: She is alert and oriented to person, place, and time. She has normal  strength. Coordination normal.  Skin: Skin is warm and dry. No rash noted. She is not diaphoretic.  Psychiatric: She has a normal mood and affect. Judgment normal.  Nursing note and vitals reviewed.   ED Course  ED EKG  Date/Time: 09/27/2014 11:13 AM Performed by: Autumn Messing, H Authorized by: Autumn Messing, H Comparison: not compared with previous ECG  Rhythm: sinus rhythm Rate: normal QRS axis: normal Conduction: conduction normal ST Segments: ST segments normal T Waves: T waves normal Other: no other findings Clinical impression: normal ECG   (including critical care time) Labs Review Labs Reviewed - No data to display  Imaging Review No results found.   MDM   1. Chest pain, atypical    Atypical chest pain. Strong family history of early coronary artery disease, she is being transferred to ED for evaluation of the chest pain. Her EKG is normal here    Graylon Good, PA-C 09/27/14 1114

## 2014-09-28 NOTE — ED Provider Notes (Signed)
CSN: [id number]     Arrival date & time 09/27/14  1151 History   First MD Initiated Contact with Patient 09/27/14 1207     Chief Complaint  Patient presents with  . Shoulder Pain  . Chest Pain     (Consider location/radiation/quality/duration/timing/severity/associated sxs/prior Treatment) Patient is a 39 y.o. female presenting with shoulder pain and chest pain.  Shoulder Pain Chest Pain   39yF with R shoulder pain, upper R back, R upper chest. Gradual onset about 2 days ago. Denies trauma/strain. Fairly constant. Worse with certain movement/positions. No midline pain. No HA. No significant change with exertion. No dyspnea, nausea, palpitations, diaphoresis. Does have hx of HTN and DM. No known hx of CAD. Strong family hx though. Never had stress testing/cath.   Past Medical History  Diagnosis Date  . Hypertension   . Diabetes mellitus without complication   . UTI (lower urinary tract infection)   . Depression   . PCOS (polycystic ovarian syndrome)    Past Surgical History  Procedure Laterality Date  . Dilation and curettage of uterus    . Wisdom tooth extraction     History reviewed. No pertinent family history. History  Substance Use Topics  . Smoking status: Former Smoker -- 1.00 packs/day    Types: Cigarettes  . Smokeless tobacco: Not on file     Comment: vapor  . Alcohol Use: Yes     Comment: rare   OB History    No data available     Review of Systems  Cardiovascular: Positive for chest pain.    All systems reviewed and negative, other than as noted in HPI.   Allergies  Sulfa antibiotics  Home Medications   Prior to Admission medications   Medication Sig Start Date End Date Taking? Authorizing Provider  atorvastatin (LIPITOR) 20 MG tablet Take 1 tablet (20 mg total) by mouth daily. 07/13/14  Yes Fransisca KaufmannLaura Davis, NP  buPROPion (WELLBUTRIN XL) 150 MG 24 hr tablet Take 1 tablet (150 mg total) by mouth daily. 07/13/14  Yes Fransisca KaufmannLaura Davis, NP  escitalopram  (LEXAPRO) 10 MG tablet Take 1 tablet (10 mg total) by mouth daily. 07/13/14  Yes Fransisca KaufmannLaura Davis, NP  ibuprofen (ADVIL,MOTRIN) 200 MG tablet Take 200 mg by mouth every 6 (six) hours as needed for mild pain.   Yes Historical Provider, MD  lisinopril (PRINIVIL,ZESTRIL) 10 MG tablet Take 1 tablet (10 mg total) by mouth daily. 07/13/14  Yes Fransisca KaufmannLaura Davis, NP  metFORMIN (GLUCOPHAGE) 1000 MG tablet Take 1 tablet (1,000 mg total) by mouth 2 (two) times daily with a meal. 07/13/14  Yes Fransisca KaufmannLaura Davis, NP  meloxicam (MOBIC) 7.5 MG tablet Take 1 tablet (7.5 mg total) by mouth daily as needed for pain. 09/27/14   Raeford RazorStephen Jamarion [place], MD  traMADol (ULTRAM) 50 MG tablet Take 1 tablet (50 mg total) by mouth every 6 (six) hours as needed. 09/27/14   Raeford RazorStephen Tanyika Barros, MD  traZODone (DESYREL) 50 MG tablet Take 1 tablet (50 mg total) by mouth at bedtime as needed for sleep. Patient not taking: Reported on 09/27/2014 07/13/14   Fransisca KaufmannLaura Davis, NP   BP 151/76 mmHg  Pulse 90  Temp(Src) 98.5 F (36.9 C) (Oral)  Resp 18  Ht 5\' 8"  (1.727 m)  Wt 253 lb 4.8 oz (114.896 kg)  BMI 38.52 kg/m2  SpO2 99%  LMP 07/29/2014 Physical Exam  Constitutional: She appears well-developed and well-nourished. No distress.  HENT:  Head: Normocephalic and atraumatic.  Eyes: Conjunctivae are normal. Right eye exhibits no  discharge. Left eye exhibits no discharge.  Neck: Neck supple.  Cardiovascular: Normal rate, regular rhythm and normal heart sounds.  Exam reveals no gallop and no friction rub.   No murmur heard. Pulmonary/Chest: Effort normal and breath sounds normal. No respiratory distress.  Abdominal: Soft. She exhibits no distension. There is no tenderness.  Musculoskeletal: She exhibits no edema or tenderness.       Arms: TTP R upper trap. No swelling/skin changes. Pain reproducible with ROM of R shoulder, particularly abduction. NVI distally.   Neurological: She is alert.  Skin: Skin is warm and dry.  Psychiatric: She has a normal mood and affect.  Her behavior is normal. Thought content normal.  Nursing note and vitals reviewed.   ED Course  Procedures (including critical care time) Labs Review Labs Reviewed - No data to display  Imaging Review No results found.   EKG Interpretation   Date/Time:  Friday September 27 2014 11:53:05 EST Ventricular Rate:  83 PR Interval:  128 QRS Duration: 88 QT Interval:  372 QTC Calculation: 437 R Axis:   69 Text Interpretation:  Normal sinus rhythm poor r wave progression.   Confirmed by Juleen China  MD, Iyona Pehrson ([id number]) on 09/28/2014 7:59:37 AM      MDM   Final diagnoses:  Right shoulder pain    39yF with R shoulderl/upper back/upper CP. She does have strong family history of CAD. That being said, her symptoms are very atypical. Very reproducible with both palpations and ROM. No swelling/rash or other symptoms/exam findings to suggest infectious process. Plan symptomatic tx of likely musculoskeletal pain.     Raeford Razor, MD 09/28/14 [phone]

## 2014-12-26 ENCOUNTER — Encounter (HOSPITAL_COMMUNITY): Payer: Self-pay | Admitting: Emergency Medicine

## 2014-12-26 ENCOUNTER — Emergency Department (INDEPENDENT_AMBULATORY_CARE_PROVIDER_SITE_OTHER)
Admission: EM | Admit: 2014-12-26 | Discharge: 2014-12-26 | Disposition: A | Discharge status: Home / Self Care | Payer: 59 | Source: Home / Self Care | Attending: Family Medicine | Admitting: Family Medicine

## 2014-12-26 DIAGNOSIS — M7150 Other bursitis, not elsewhere classified, unspecified site: Secondary | ICD-10-CM

## 2014-12-26 DIAGNOSIS — M7050 Other bursitis of knee, unspecified knee: Secondary | ICD-10-CM

## 2014-12-26 MED ORDER — DICLOFENAC SODIUM 1 % TD GEL: 2.0000 g | Freq: Four times a day (QID) | TRANSDERMAL | Status: DC

## 2014-12-26 MED ORDER — NAPROXEN 500 MG PO TABS: 500.0000 mg | ORAL_TABLET | Freq: Two times a day (BID) | ORAL | Status: DC

## 2014-12-26 NOTE — ED Notes (Addendum)
Pt states that her right leg has been hurting since last night pt denies any fall or injury

## 2014-12-26 NOTE — Discharge Instructions (Signed)
Thank you for coming in today. Take naproxen. Use Voltaren gel as needed. Follow-up with Dr. Farris Has at [facility] if not better. Use ice as needed.   Pes Anserinus Syndrome with Rehab The pes anserine, also known as the goose's foot, is an area of the shinbone (tibia) near the knee joint where the tendons of three of the muscles of the thigh insert into the bone. These muscles are important for bending the knee and bringing the leg across the body. Just underneath the three tendons that attach at the pes anserinus exists a fluid filled sac (bursa) that is meant to reduce the friction between the tendons and the tibia. Pes anserinus syndrome is a condition that is characterized by inflammation of the bursa (bursitis) and/ or tendonitis (inflammation of the tendon) and may cause severe pain in the lower portion of the inner (medial) side of the knee. SYMPTOMS   Pain and inflammation over the lower portion of the medial side of the knee.  Pain that worsens as the duration of an activity increases.  Pain that worsens when bending the knee, especially against resistance.  A crackling sound (crepitation) when the tendon or bursa is moved or touched. CAUSES  Bursitis and tendonitis are usually characterized as overuse injuries. Common mechanisms of injury include:  Stress placed on the knee from a sudden increase in the intensity, frequency, or duration of training.  Direct trauma to the upper leg (less common). RISK INCREASES WITH:  Endurance sports (distance running or triathletes).  Making changes to or beginning a new training program.  Sports that place stress on the muscles that insert at the pes anserinus, such as those that require pivoting, cutting, or jumping.  Improper training.  Poor strength and flexibility  Failure to warm-up properly before activity.  Improper knee alignment ( knock knees).  Arthritis of the knee. PREVENTION  Warm up and stretch  properly before activity.  Allow for adequate recovery between workouts.  Maintain physical fitness:  Strength, flexibility, and endurance.  Cardiovascular fitness.  Learn and use training methods that will reduce the stress placed on the pes anserinus.  Arch supports (orthotics) may be helpful for those with flat feet. PROGNOSIS  If treated properly, then the symptoms of pes anserinus syndrome usually resolve within 6 weeks.  RELATED COMPLICATIONS   Persistent and potentially chronic pain if the condition is not treated properly.  Re-injury if activity is resumed before the injury is allowed to heal completely, or if one resumes improper training habits. TREATMENT Treatment initially involves the use of ice and medication to help reduce pain and inflammation. The use of strengthening and stretching exercises may help reduce pain with activity. These exercises may be performed at home or with a therapist. Individuals who have flat feet may find benefit in wearing arch supports in their shoes. Some individuals find that compression bandages or knee sleeves help reduce symptoms. Your caregiver may recommend a corticosteroid injection to help reduce inflammation. If symptoms persist, despite conservative treatment for greater than 6 months, then surgery may be recommended.  MEDICATION   If pain medication is necessary, then nonsteroidal anti-inflammatory medications, such as aspirin and ibuprofen, or other minor pain relievers, such as acetaminophen, are often recommended.  Do not take pain medication for 7 days before surgery.  Prescription pain relievers may be given if deemed necessary by your caregiver. Use only as directed and only as much as you need.  Corticosteroid injections may be given by your caregiver. These  injections should be reserved for the most serious cases, because they may only be given a certain number of times. SEEK MEDICAL CARE IF:  Treatment seems to offer no  benefit, or the condition worsens.  Any medications produce adverse side effects. EXERCISES  RANGE OF MOTION (ROM) AND STRETCHING EXERCISES - Pes Anserinus Syndrome These exercises may help you when beginning to rehabilitate your injury. Your symptoms may resolve with or without further involvement from your physician, physical therapist or athletic trainer. While completing these exercises, remember:   Restoring tissue flexibility helps normal motion to return to the joints. This allows healthier, less painful movement and activity.  An effective stretch should be held for at least 30 seconds.  A stretch should never be painful. You should only feel a gentle lengthening or release in the stretched tissue. STRETCH - Hamstrings, Supine  Lie on your back. Loop a belt or towel over the ball of your right / left foot.  Straighten your right / left knee and slowly pull on the belt to raise your leg. Do not allow the right / left knee to bend. Keep your opposite leg flat on the floor.  Raise the leg until you feel a gentle stretch behind your right / left knee or thigh. Hold this position for __________ seconds. Repeat __________ times. Complete this stretch __________ times per day.  STRETCH - Hamstrings, Doorway  Lie on your back with your right / left leg extended and resting on the wall and the opposite leg flat on the ground through the door. Initially, position your bottom farther away from the wall than the illustration shows.  Keep your right / left knee straight. If you feel a stretch behind your knee or thigh, hold this position for __________ seconds.  If you do not feel a stretch, scoot your bottom closer to the door, and hold __________ seconds. Repeat __________ times. Complete this stretch __________ times per day.  STRETCH - Hamstrings/Adductors, V-Sit  Sit on the floor with your legs extended in a large "V," keeping your knees straight.  With your head and chest upright,  bend at your waist reaching for your right foot to stretch your left adductors.  You should feel a stretch in your left inner thigh. Hold for __________ seconds.  Return to the upright position to relax your leg muscles.  Continuing to keep your chest upright, bend straight forward at your waist to stretch your hamstrings.  You should feel a stretch behind both of your thighs and/or knees. Hold for __________ seconds.  Return to the upright position to relax your leg muscles.  Repeat steps 2 through 4. Repeat __________ times. Complete this exercise __________ times per day.  STRETCH - Hamstrings, Standing  Stand or sit and extend your right / left leg, placing your foot on a chair or foot stool  Keeping a slight arch in your low back and your hips straight forward.  Lead with your chest and lean forward at the waist until you feel a gentle stretch in the back of your right / left knee or thigh. (When done correctly, this exercise requires leaning only a small distance.)  Hold this position for __________ seconds. Repeat __________ times. Complete this stretch __________ times per day. STRETCH - Adductors, Lunge  While standing, spread your legs  Lean away from your right / left leg by bending your opposite knee. You may rest your hands on your thigh for balance.  You should feel a stretch in  your right / left inner thigh. Hold for __________ seconds. Repeat __________ times. Complete this exercise __________ times per day.  STRETCH - Adductors, Standing  Place your right / left foot on a counter or stable table. Turn away from your leg so both hips line up with your right / left leg.  Keeping your hips facing forward, slowly bend your opposite leg until you feel a gentle stretch on the inside of your right / left thigh.  Hold for __________ seconds. Repeat __________ times. Complete this exercise __________ times per day.  STRENGTHENING EXERCISES - Pes Anserinus Syndrome    These exercises may help you when beginning to rehabilitate your injury. They may resolve your symptoms with or without further involvement from your physician, physical therapist or athletic trainer. While completing these exercises, remember:   Muscles can gain both the endurance and the strength needed for everyday activities through controlled exercises.  Complete these exercises as instructed by your physician, physical therapist or athletic trainer. Progress the resistance and repetitions only as guided. STRENGTH - Hamstring, Curls  Lay on your stomach with your legs extended. (If you lay on a bed, your feet may hang over the edge.)  Tighten the muscles in the back of your thigh to bend your right / left knee up to 90 degrees. Keep your hips flat on the bed/floor.  Hold this position for __________ seconds.  Slowly lower your leg back to the starting position. Repeat __________ times. Complete this exercise __________ times per day.  OPTIONAL ANKLE WEIGHTS: Begin with ____________________, but DO NOT exceed ____________________. Increase in 1 lb/0.5 kg increments.  STRENGTH - Hip Adductors, Straight Leg Raises  Lie on your side so that your head, shoulders, knee and hip line up. You may place your upper foot in front to help maintain your balance. Your right / left leg should be on the bottom.  Roll your hips slightly forward, so that your hips are stacked directly over each other and your right / left knee is facing forward.  Tense the muscles in your inner thigh and lift your bottom leg 4-6 inches. Hold this position for __________ seconds.  Slowly lower your leg to the starting position. Allow the muscles to fully relax before beginning the next repetition. Repeat __________ times. Complete this exercise __________ times per day.  Document Released: 07/12/2005 Document Revised: 10/04/2011 Document Reviewed: 10/24/2008 [facility] Patient Information 2015 [place], [place]. This  information is not intended to replace advice given to you by your health care provider. Make sure you discuss any questions you have with your health care provider.   Cryotherapy Cryotherapy means treatment with cold. Ice or gel packs can be used to reduce both pain and swelling. Ice is the most helpful within the first 24 to 48 hours after an injury or flare-up from overusing a muscle or joint. Sprains, strains, spasms, burning pain, shooting pain, and aches can all be eased with ice. Ice can also be used when recovering from surgery. Ice is effective, has very few side effects, and is safe for most people to use. PRECAUTIONS  Ice is not a safe treatment option for people with:  Raynaud phenomenon. This is a condition affecting small blood vessels in the extremities. Exposure to cold may cause your problems to return.  Cold hypersensitivity. There are many forms of cold hypersensitivity, including:  Cold urticaria. Red, itchy hives appear on the skin when the tissues begin to warm after being iced.  Cold erythema. This is  a red, itchy rash caused by exposure to cold.  Cold hemoglobinuria. Red blood cells break down when the tissues begin to warm after being iced. The hemoglobin that carry oxygen are passed into the urine because they cannot combine with blood proteins fast enough.  Numbness or altered sensitivity in the area being iced. If you have any of the following conditions, do not use ice until you have discussed cryotherapy with your caregiver:  Heart conditions, such as arrhythmia, angina, or chronic heart disease.  High blood pressure.  Healing wounds or open skin in the area being iced.  Current infections.  Rheumatoid arthritis.  Poor circulation.  Diabetes. Ice slows the blood flow in the region it is applied. This is beneficial when trying to stop inflamed tissues from spreading irritating chemicals to surrounding tissues. However, if you expose your skin to cold  temperatures for too long or without the proper protection, you can damage your skin or nerves. Watch for signs of skin damage due to cold. HOME CARE INSTRUCTIONS Follow these tips to use ice and cold packs safely.  Place a dry or damp towel between the ice and skin. A damp towel will cool the skin more quickly, so you may need to shorten the time that the ice is used.  For a more rapid response, add gentle compression to the ice.  Ice for no more than 10 to 20 minutes at a time. The bonier the area you are icing, the less time it will take to get the benefits of ice.  Check your skin after 5 minutes to make sure there are no signs of a poor response to cold or skin damage.  Rest 20 minutes or more between uses.  Once your skin is numb, you can end your treatment. You can test numbness by very lightly touching your skin. The touch should be so light that you do not see the skin dimple from the pressure of your fingertip. When using ice, most people will feel these normal sensations in this order: cold, burning, aching, and numbness.  Do not use ice on someone who cannot communicate their responses to pain, such as small children or people with dementia. HOW TO MAKE AN ICE PACK Ice packs are the most common way to use ice therapy. Other methods include ice massage, ice baths, and cryosprays. Muscle creams that cause a cold, tingly feeling do not offer the same benefits that ice offers and should not be used as a substitute unless recommended by your caregiver. To make an ice pack, do one of the following:  Place crushed ice or a bag of frozen vegetables in a sealable plastic bag. Squeeze out the excess air. Place this bag inside another plastic bag. Slide the bag into a pillowcase or place a damp towel between your skin and the bag.  Mix 3 parts water with 1 part rubbing alcohol. Freeze the mixture in a sealable plastic bag. When you remove the mixture from the freezer, it will be slushy.  Squeeze out the excess air. Place this bag inside another plastic bag. Slide the bag into a pillowcase or place a damp towel between your skin and the bag. SEEK MEDICAL CARE IF:  You develop white spots on your skin. This may give the skin a blotchy (mottled) appearance.  Your skin turns blue or pale.  Your skin becomes waxy or hard.  Your swelling gets worse. MAKE SURE YOU:   Understand these instructions.  Will watch your condition.  Will get help right away if you are not doing well or get worse. Document Released: 03/08/2011 Document Revised: 11/26/2013 Document Reviewed: 03/08/2011 [facility] Patient Information 2015 [place], [place]. This information is not intended to replace advice given to you by your health care provider. Make sure you discuss any questions you have with your health care provider.

## 2014-12-26 NOTE — ED Provider Notes (Signed)
Julia Dickerson is a 39 y.o. female who presents to Urgent Care today for right knee pain. Patient has pain in her right knee located at the medial proximal tibia. She cannot recall any injury. The pain is worse with activity. No radiating pain weakness or numbness fevers or chills. She's tried some over-the-counter medications which have helped. She feels well otherwise.   Past Medical History  Diagnosis Date  . Hypertension   . Diabetes mellitus without complication   . UTI (lower urinary tract infection)   . Depression   . PCOS (polycystic ovarian syndrome)    Past Surgical History  Procedure Laterality Date  . Dilation and curettage of uterus    . Wisdom tooth extraction     History  Substance Use Topics  . Smoking status: Former Smoker -- 1.00 packs/day    Types: Cigarettes  . Smokeless tobacco: Not on file     Comment: vapor  . Alcohol Use: Yes     Comment: rare   ROS as above Medications: No current facility-administered medications for this encounter.   Current Outpatient Prescriptions  Medication Sig Dispense Refill  . atorvastatin (LIPITOR) 20 MG tablet Take 1 tablet (20 mg total) by mouth daily.    [name]. buPROPion (WELLBUTRIN XL) 150 MG 24 hr tablet Take 1 tablet (150 mg total) by mouth daily. 30 tablet 0  . escitalopram (LEXAPRO) 10 MG tablet Take 1 tablet (10 mg total) by mouth daily. 30 tablet 0  . ibuprofen (ADVIL,MOTRIN) 200 MG tablet Take 200 mg by mouth every 6 (six) hours as needed for mild pain.    [name]. lisinopril (PRINIVIL,ZESTRIL) 10 MG tablet Take 1 tablet (10 mg total) by mouth daily.    . meloxicam (MOBIC) 7.5 MG tablet Take 1 tablet (7.5 mg total) by mouth daily as needed for pain. 10 tablet 0  . metFORMIN (GLUCOPHAGE) 1000 MG tablet Take 1 tablet (1,000 mg total) by mouth 2 (two) times daily with a meal.    . traMADol (ULTRAM) 50 MG tablet Take 1 tablet (50 mg total) by mouth every 6 (six) hours as needed. 10 tablet 0  . traZODone (DESYREL) 50 MG tablet Take 1  tablet (50 mg total) by mouth at bedtime as needed for sleep. (Patient not taking: Reported on 09/27/2014) 30 tablet 0   Allergies  Allergen Reactions  . Sulfa Antibiotics Hives     Exam:  BP 135/82 mmHg  Pulse 88  Temp(Src) 98.6 F (37 C) (Oral)  Resp 16  SpO2 97% Gen: Well NAD HEENT: EOMI,  MMM Lungs: Normal work of breathing. CTABL Heart: RRR no MRG Abd: NABS, Soft. Nondistended, Nontender Exts: Brisk capillary refill, warm and well perfused.  Right knee normal-appearing no effusion. Tender palpation overlying the pes anserine bursa. Range of motion 0-120. Intact extension and flexion strength. Pain with resisted knee flexion. Stable ligamentous exam negative McMurray's test.  No results found for this or any previous visit (from the past 24 hour(s)). No results found.  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 Treat with oral naproxen and topical diclofenac. Home exercise program follow up with sports medicine if not better.  Discussed warning signs or symptoms. Please see discharge instructions. Patient expresses understanding.     Rodolph BongEvan S Corey, MD 12/26/14 [phone]

## 2015-01-23 ENCOUNTER — Emergency Department (INDEPENDENT_AMBULATORY_CARE_PROVIDER_SITE_OTHER): Payer: 59

## 2015-01-23 ENCOUNTER — Encounter (HOSPITAL_COMMUNITY): Payer: Self-pay | Admitting: Emergency Medicine

## 2015-01-23 ENCOUNTER — Emergency Department (INDEPENDENT_AMBULATORY_CARE_PROVIDER_SITE_OTHER)
Admission: EM | Admit: 2015-01-23 | Discharge: 2015-01-23 | Disposition: A | Discharge status: Home / Self Care | Payer: 59 | Source: Home / Self Care | Attending: Family Medicine | Admitting: Family Medicine

## 2015-01-23 DIAGNOSIS — K5909 Other constipation: Secondary | ICD-10-CM | POA: Diagnosis not present

## 2015-01-23 DIAGNOSIS — K3184 Gastroparesis: Secondary | ICD-10-CM

## 2015-01-23 LAB — POCT URINALYSIS DIP (DEVICE)
Bilirubin Urine: NEGATIVE
Glucose, UA: 500 mg/dL — AB
Hgb urine dipstick: NEGATIVE
Ketones, ur: NEGATIVE mg/dL
Leukocytes, UA: NEGATIVE
Nitrite: NEGATIVE
Protein, ur: NEGATIVE mg/dL
Specific Gravity, Urine: 1.015 (ref 1.005–1.030)
Urobilinogen, UA: 0.2 mg/dL (ref 0.0–1.0)
pH: 6 (ref 5.0–8.0)

## 2015-01-23 LAB — POCT PREGNANCY, URINE: Preg Test, Ur: NEGATIVE

## 2015-01-23 MED ORDER — METOCLOPRAMIDE HCL 5 MG PO TABS: 5.0000 mg | ORAL_TABLET | Freq: Three times a day (TID) | ORAL | Status: DC

## 2015-01-23 NOTE — Discharge Instructions (Signed)
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 The diabetic gastroparesis is caused by progressive nerve injury to your GI tract from elevated sugar. You must have better controlling her sugar in order to prevent this from getting worse. Please start the Reglan and follow-up with your primary care physician for refills on this medicine. This medicine should be taken over a long period of time. Please start using MiraLAX for the constipation. Increase her dose of MiraLAX from once daily to twice daily to increased volumes of MiraLAX until he have daily soft bowel movements.

## 2015-01-23 NOTE — ED Provider Notes (Signed)
CSN: [id number]     Arrival date & time 01/23/15  1302 History   First MD Initiated Contact with Patient 01/23/15 1325     Chief Complaint  Patient presents with  . Abdominal Pain   (Consider location/radiation/quality/duration/timing/severity/associated sxs/prior Treatment) HPI  Abd pain. Started a couple weeks ago. Constant. Getting worse. Located under the rib cage. Intermittent episodes of diarrhea and constipation. Daily BMs. No improvement w/ defication. Some nausea. Sometimes associated w/ lower back pain. Deneis dysuria, frequency, fevers, melena, emesis, hematuria. Increased fatigue recently.  Does not take her blood pressure medication this morning.  Past Medical History  Diagnosis Date  . Hypertension   . Diabetes mellitus without complication   . UTI (lower urinary tract infection)   . Depression   . PCOS (polycystic ovarian syndrome)    Past Surgical History  Procedure Laterality Date  . Dilation and curettage of uterus    . Wisdom tooth extraction     Family History  Problem Relation Age of Onset  . Heart failure Father   . Heart attack Father    History  Substance Use Topics  . Smoking status: Former Smoker -- 1.00 packs/day    Types: Cigarettes  . Smokeless tobacco: Not on file     Comment: vapor  . Alcohol Use: Yes     Comment: rare   OB History    No data available     Review of Systems Per HPI with all other pertinent systems negative.   Allergies  Sulfa antibiotics  Home Medications   Prior to Admission medications   Medication Sig Start Date End Date Taking? Authorizing Provider  atorvastatin (LIPITOR) 20 MG tablet Take 1 tablet (20 mg total) by mouth daily. 07/13/14  Yes Thermon LeylandLaura A Davis, NP  buPROPion (WELLBUTRIN XL) 150 MG 24 hr tablet Take 1 tablet (150 mg total) by mouth daily. 07/13/14  Yes Thermon LeylandLaura A Davis, NP  diclofenac sodium (VOLTAREN) 1 % GEL Apply 2 g topically 4 (four) times daily. 12/26/14  Yes Rodolph BongEvan S Corey, MD  escitalopram  (LEXAPRO) 10 MG tablet Take 1 tablet (10 mg total) by mouth daily. 07/13/14  Yes Thermon LeylandLaura A Davis, NP  ibuprofen (ADVIL,MOTRIN) 200 MG tablet Take 200 mg by mouth every 6 (six) hours as needed for mild pain.   Yes Historical Provider, MD  lisinopril (PRINIVIL,ZESTRIL) 10 MG tablet Take 1 tablet (10 mg total) by mouth daily. 07/13/14  Yes Thermon LeylandLaura A Davis, NP  metFORMIN (GLUCOPHAGE) 1000 MG tablet Take 1 tablet (1,000 mg total) by mouth 2 (two) times daily with a meal. 07/13/14  Yes Thermon LeylandLaura A Davis, NP  traZODone (DESYREL) 50 MG tablet Take 1 tablet (50 mg total) by mouth at bedtime as needed for sleep. 07/13/14  Yes Thermon LeylandLaura A Davis, NP  meloxicam (MOBIC) 7.5 MG tablet Take 1 tablet (7.5 mg total) by mouth daily as needed for pain. 09/27/14   Raeford RazorStephen Kohut, MD  metoCLOPramide (REGLAN) 5 MG tablet Take 1 tablet (5 mg total) by mouth 3 (three) times daily before meals. 01/23/15   Ozella Rocksavid J Merrell, MD  naproxen (NAPROSYN) 500 MG tablet Take 1 tablet (500 mg total) by mouth 2 (two) times daily. 12/26/14   Rodolph BongEvan S Corey, MD  traMADol (ULTRAM) 50 MG tablet Take 1 tablet (50 mg total) by mouth every 6 (six) hours as needed. 09/27/14   Raeford RazorStephen Kohut, MD   BP 172/113 mmHg  Pulse 94  Temp(Src) 99.3 F (37.4 C) (Oral)  Resp 14  SpO2 96%  LMP 11/14/2014 Physical Exam Physical Exam  Constitutional: oriented to person, place, and time. appears well-developed and well-nourished. No distress.  HENT:  Head: Normocephalic and atraumatic.  Eyes: EOMI. PERRL.  Neck: Normal range of motion.  Cardiovascular: RRR, no m/r/g, 2+ distal pulses,  Pulmonary/Chest: Effort normal and breath sounds normal. No respiratory distress.  Abdominal: Soft. Bowel sounds are normal. NonTTP, no distension.  negative Murphy sign and nontender at McBurney's point.  Musculoskeletal: Normal range of motion. Non ttp, no effusion.  Neurological: alert and oriented to person, place, and time.  Skin: Skin is warm. No rash noted. non diaphoretic.   Psychiatric: normal mood and affect. behavior is normal. Judgment and thought content normal.   ED Course  Procedures (including critical care time) Labs Review Labs Reviewed  POCT URINALYSIS DIP (DEVICE) - Abnormal; Notable for the following:    Glucose, UA 500 (*)    All other components within normal limits  POCT PREGNANCY, URINE    Imaging Review Dg Abd 1 View  01/23/2015   CLINICAL DATA:  Abdominal pain, constipation  EXAM: ABDOMEN - 1 VIEW  COMPARISON:  None.  FINDINGS: There is normal small bowel gas pattern. Moderate stool noted in right colon and transverse colon. Bony structures are unremarkable.  IMPRESSION: Normal small bowel gas pattern. Moderate stool noted in right colon and transverse colon.   Electronically Signed   By: Natasha Mead M.D.   On: 01/23/2015 14:02     MDM   1. Other constipation   2. Gastroparesis     Suspected on the pain is likely secondary to beginnings of diabetic gastroparesis and constipation.. Start Reglan and follow-up with primary care physician. Diabetes: Patient states that she does not check her home glucose. 500 glucose noted in her care urinalysis today. Patient on metformin. Counseled patient to take her medications as prescribed and to check her sugar regularly. Follow-up with PCP regarding further management of her diabetes. Hypertension: Patient did not take her medications this morning which would explain her elevation.   Ozella Rocks, MD 01/23/15 [id number]

## 2015-01-23 NOTE — ED Notes (Signed)
Reports upper abdominal pain.  States either I have severe diarrhea or severe constipation.  No otc treatments tried.  On set about 2 wks.   Mild nausea.  No vomiting.

## 2015-02-24 ENCOUNTER — Emergency Department (INDEPENDENT_AMBULATORY_CARE_PROVIDER_SITE_OTHER)
Admission: EM | Admit: 2015-02-24 | Discharge: 2015-02-24 | Disposition: A | Discharge status: Home / Self Care | Payer: 59 | Source: Home / Self Care | Attending: Family Medicine | Admitting: Family Medicine

## 2015-02-24 ENCOUNTER — Encounter (HOSPITAL_COMMUNITY): Payer: Self-pay | Admitting: Emergency Medicine

## 2015-02-24 DIAGNOSIS — M5013 Cervical disc disorder with radiculopathy, cervicothoracic region: Secondary | ICD-10-CM

## 2015-02-24 MED ORDER — METHYLPREDNISOLONE 4 MG PO TBPK: ORAL_TABLET | ORAL | Status: DC

## 2015-02-24 MED ORDER — CYCLOBENZAPRINE HCL 5 MG PO TABS: 5.0000 mg | ORAL_TABLET | Freq: Three times a day (TID) | ORAL | Status: DC | PRN

## 2015-02-24 NOTE — ED Provider Notes (Signed)
CSN: [id number]     Arrival date & time 02/24/15  1310 History   First MD Initiated Contact with Patient 02/24/15 1553     Chief Complaint  Patient presents with  . Shoulder Pain   (Consider location/radiation/quality/duration/timing/severity/associated sxs/prior Treatment) Patient is a 39 y.o. female presenting with shoulder pain. The history is provided by the patient.  Shoulder Pain Location:  Shoulder (sx primarily in right scapula.) Time since incident:  2 months Injury: no   Shoulder location:  R shoulder Pain details:    Quality:  Shooting and sharp   Radiates to:  R upper arm   Severity:  Moderate   Progression:  Unchanged Chronicity:  Chronic Dislocation: no   Relieved by:  Nothing Associated symptoms: back pain and neck pain     Past Medical History  Diagnosis Date  . Hypertension   . Diabetes mellitus without complication   . UTI (lower urinary tract infection)   . Depression   . PCOS (polycystic ovarian syndrome)    Past Surgical History  Procedure Laterality Date  . Dilation and curettage of uterus    . Wisdom tooth extraction     Family History  Problem Relation Age of Onset  . Heart failure Father   . Heart attack Father    History  Substance Use Topics  . Smoking status: Former Smoker -- 1.00 packs/day    Types: Cigarettes  . Smokeless tobacco: Not on file     Comment: vapor  . Alcohol Use: Yes     Comment: rare   OB History    No data available     Review of Systems  Constitutional: Negative.   Musculoskeletal: Positive for back pain and neck pain.  Skin: Negative.     Allergies  Sulfa antibiotics  Home Medications   Prior to Admission medications   Medication Sig Start Date End Date Taking? Authorizing Provider  atorvastatin (LIPITOR) 20 MG tablet Take 1 tablet (20 mg total) by mouth daily. 07/13/14  Yes Thermon Leyland, NP  buPROPion (WELLBUTRIN XL) 150 MG 24 hr tablet Take 1 tablet (150 mg total) by mouth daily. 07/13/14  Yes  Thermon Leyland, NP  escitalopram (LEXAPRO) 10 MG tablet Take 1 tablet (10 mg total) by mouth daily. 07/13/14  Yes Thermon Leyland, NP  lisinopril (PRINIVIL,ZESTRIL) 10 MG tablet Take 1 tablet (10 mg total) by mouth daily. 07/13/14  Yes Thermon Leyland, NP  meloxicam (MOBIC) 7.5 MG tablet Take 1 tablet (7.5 mg total) by mouth daily as needed for pain. 09/27/14  Yes Raeford Razor, MD  metFORMIN (GLUCOPHAGE) 1000 MG tablet Take 1 tablet (1,000 mg total) by mouth 2 (two) times daily with a meal. 07/13/14  Yes Thermon Leyland, NP  cyclobenzaprine (FLEXERIL) 5 MG tablet Take 1 tablet (5 mg total) by mouth 3 (three) times daily as needed for muscle spasms. 02/24/15   Linna Hoff, MD  diclofenac sodium (VOLTAREN) 1 % GEL Apply 2 g topically 4 (four) times daily. 12/26/14   Rodolph Bong, MD  ibuprofen (ADVIL,MOTRIN) 200 MG tablet Take 200 mg by mouth every 6 (six) hours as needed for mild pain.    Historical Provider, MD  methylPREDNISolone (MEDROL DOSEPAK) 4 MG TBPK tablet follow package directions, start on tues and take until finished 02/24/15   Linna Hoff, MD  metoCLOPramide (REGLAN) 5 MG tablet Take 1 tablet (5 mg total) by mouth 3 (three) times daily before meals. 01/23/15   Ozella Rocks,  MD  naproxen (NAPROSYN) 500 MG tablet Take 1 tablet (500 mg total) by mouth 2 (two) times daily. 12/26/14   Rodolph Bong, MD  traMADol (ULTRAM) 50 MG tablet Take 1 tablet (50 mg total) by mouth every 6 (six) hours as needed. 09/27/14   Raeford Razor, MD  traZODone (DESYREL) 50 MG tablet Take 1 tablet (50 mg total) by mouth at bedtime as needed for sleep. 07/13/14   Thermon Leyland, NP   BP 156/96 mmHg  Pulse 83  Temp(Src) 99.1 F (37.3 C) (Oral)  Resp 16  SpO2 97%  LMP 02/03/2015 Physical Exam  Constitutional: She is oriented to person, place, and time. She appears well-developed and well-nourished. No distress.  Musculoskeletal: She exhibits tenderness.       Right shoulder: She exhibits decreased range of motion and  tenderness. She exhibits normal pulse.       Arms: Neurological: She is alert and oriented to person, place, and time.  Skin: Skin is warm and dry.  Nursing note and vitals reviewed.   ED Course  Procedures (including critical care time) Labs Review Labs Reviewed - No data to display  Imaging Review No results found.   MDM   1. 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

## 2015-02-24 NOTE — ED Notes (Signed)
C/o persistent right shoulder pain onset 2-3 months.... Pain got worse yesterday Seen at [facility] ED and here for similar sx in the past Denies inj/trauma Alert, no signs of acute distress.

## 2015-04-25 ENCOUNTER — Encounter (HOSPITAL_COMMUNITY): Payer: Self-pay | Admitting: Emergency Medicine

## 2015-04-25 ENCOUNTER — Emergency Department (HOSPITAL_COMMUNITY): Payer: 59

## 2015-04-25 ENCOUNTER — Emergency Department (HOSPITAL_COMMUNITY)
Admission: EM | Admit: 2015-04-25 | Discharge: 2015-04-25 | Disposition: A | Discharge status: Home / Self Care | Payer: 59 | Attending: Emergency Medicine | Admitting: Emergency Medicine

## 2015-04-25 DIAGNOSIS — R002 Palpitations: Secondary | ICD-10-CM | POA: Diagnosis present

## 2015-04-25 DIAGNOSIS — I10 Essential (primary) hypertension: Secondary | ICD-10-CM | POA: Insufficient documentation

## 2015-04-25 DIAGNOSIS — E669 Obesity, unspecified: Secondary | ICD-10-CM | POA: Insufficient documentation

## 2015-04-25 DIAGNOSIS — E119 Type 2 diabetes mellitus without complications: Secondary | ICD-10-CM | POA: Diagnosis not present

## 2015-04-25 DIAGNOSIS — F329 Major depressive disorder, single episode, unspecified: Secondary | ICD-10-CM | POA: Insufficient documentation

## 2015-04-25 DIAGNOSIS — Z87440 Personal history of urinary (tract) infections: Secondary | ICD-10-CM | POA: Insufficient documentation

## 2015-04-25 DIAGNOSIS — Z79899 Other long term (current) drug therapy: Secondary | ICD-10-CM | POA: Insufficient documentation

## 2015-04-25 DIAGNOSIS — I4891 Unspecified atrial fibrillation: Secondary | ICD-10-CM | POA: Diagnosis not present

## 2015-04-25 DIAGNOSIS — Z87891 Personal history of nicotine dependence: Secondary | ICD-10-CM | POA: Insufficient documentation

## 2015-04-25 HISTORY — DX: Obesity, unspecified: E66.9

## 2015-04-25 LAB — BASIC METABOLIC PANEL
Anion gap: 12 (ref 5–15)
BUN: 12 mg/dL (ref 6–20)
CO2: 19 mmol/L — ABNORMAL LOW (ref 22–32)
Calcium: 9.1 mg/dL (ref 8.9–10.3)
Chloride: 106 mmol/L (ref 101–111)
Creatinine, Ser: 0.77 mg/dL (ref 0.44–1.00)
GFR calc Af Amer: >60 mL/min (ref >60)
GFR calc non Af Amer: >60 mL/min (ref >60)
Glucose, Bld: 262 mg/dL — ABNORMAL HIGH (ref 65–99)
Potassium: 4.1 mmol/L (ref 3.5–5.1)
Sodium: 137 mmol/L (ref 135–145)

## 2015-04-25 LAB — TROPONIN I: Troponin I: <0.03 ng/mL (ref <0.031)

## 2015-04-25 LAB — CBC
HCT: 46.3 % — ABNORMAL HIGH (ref 36.0–46.0)
Hemoglobin: 16.2 g/dL — ABNORMAL HIGH (ref 12.0–15.0)
MCH: 32.4 pg (ref 26.0–34.0)
MCHC: 35 g/dL (ref 30.0–36.0)
MCV: 92.6 fL (ref 78.0–100.0)
Platelets: 366 10*3/uL (ref 150–400)
RBC: 5 MIL/uL (ref 3.87–5.11)
RDW: 13.5 % (ref 11.5–15.5)
WBC: 15.5 10*3/uL — ABNORMAL HIGH (ref 4.0–10.5)

## 2015-04-25 LAB — D-DIMER, QUANTITATIVE: D-Dimer, Quant: 0.27 ug/mL-FEU (ref 0.00–0.48)

## 2015-04-25 LAB — POC URINE PREG, ED: Preg Test, Ur: NEGATIVE

## 2015-04-25 MED ORDER — ADENOSINE 6 MG/2ML IV SOLN
INTRAVENOUS | Status: AC
  Administered 2015-04-25: 6 mg
  Filled 2015-04-25: qty 6

## 2015-04-25 MED ORDER — DILTIAZEM HCL 25 MG/5ML IV SOLN
10.0000 mg | Freq: Once | INTRAVENOUS | Status: AC
Start: 2015-04-25 — End: 2015-04-25
  Administered 2015-04-25: 10 mg via INTRAVENOUS
  Filled 2015-04-25: qty 5

## 2015-04-25 MED ORDER — DILTIAZEM LOAD VIA INFUSION
10.0000 mg | Freq: Once | INTRAVENOUS | Status: AC
  Administered 2015-04-25: 10 mg via INTRAVENOUS

## 2015-04-25 MED ORDER — DILTIAZEM HCL 100 MG IV SOLR
5.0000 mg/h | Freq: Once | INTRAVENOUS | Status: AC
  Administered 2015-04-25: 5 mg/h via INTRAVENOUS
  Filled 2015-04-25: qty 100

## 2015-04-25 MED ORDER — FLECAINIDE ACETATE 100 MG PO TABS
300.0000 mg | ORAL_TABLET | Freq: Once | ORAL | Status: AC
  Administered 2015-04-25: 300 mg via ORAL
  Filled 2015-04-25: qty 3

## 2015-04-25 MED ORDER — SODIUM CHLORIDE 0.9 % IV BOLUS (SEPSIS)
1000.0000 mL | Freq: Once | INTRAVENOUS | Status: AC
  Administered 2015-04-25: 1000 mL via INTRAVENOUS

## 2015-04-25 NOTE — ED Notes (Signed)
MD at bedside. 

## 2015-04-25 NOTE — ED Notes (Signed)
Pt. reports palpitations onset today , denies chest pain or SOB .

## 2015-04-25 NOTE — Consult Note (Signed)
Referring Physician: Dr. Wilkie Aye Primary Physician: Primary Cardiologist:  Reason for Consultation: new onset AF RVR   HPI: 39 yo woman with HTN and DM who presents to ED with palpitations.  She reports going to sleep in normal state of health around 8:30-9:00 pm and woke up feeling fast palpitations.  Says they feel fast and irregular and that husband could feel them on her chest as well.  Upon arrival to ED she was hypertensive and tachycardic and ECG reveals AF with RVR prompting cardiology consultation.  She reports that she had an episode of sustained palpitations lasting about 1 hour some time ago, about 8-9 years ago.  She saw a cardiologist at that time, not locally, and wore a monitor that did not show any arrhythmia.  Since then, she reports and occasional "skipped beat" but no sustained palpitations.    Father had an MI, grandmother had CHF.  No h/o arrhythmia or sudden death.      Review of Systems:     Cardiac Review of Systems: {Y] = yes  = no  Chest Pain [    ]  Resting SOB [   ] Exertional SOB  [  ]  Orthopnea [  ]   Pedal Edema [   ]    Palpitations [x  ] Syncope  [  ]   Presyncope [   ]  General Review of Systems: [Y] = yes [  ]=no Constitional: recent weight change [  ]; anorexia [  ]; fatigue [  ]; nausea [  ]; night sweats [  ]; fever [  ]; or chills [  ];                                                                      Eyes : blurred vision [  ]; diplopia [   ]; vision changes [  ];  Amaurosis fugax[  ]; Resp: cough [  ];  wheezing[  ];  hemoptysis[  ];  PND [  ];  GI:  gallstones[  ], vomiting[  ];  dysphagia[  ]; melena[  ];  hematochezia [  ]; heartburn[  ];   GU: kidney stones [  ]; hematuria[  ];   dysuria [  ];  nocturia[  ]; incontinence [  ];             Skin: rash, swelling[  ];, hair loss[  ];  peripheral edema[  ];  or itching[  ]; Musculosketetal: myalgias[  ];  joint swelling[  ];  joint erythema[  ];  joint pain[  ];  back pain[   ];  Heme/Lymph: bruising[  ];  bleeding[  ];  anemia[  ];  Neuro: TIA[  ];  headaches[  ];  stroke[  ];  vertigo[  ];  seizures[  ];   paresthesias[  ];  difficulty walking[  ];  Psych:depression[  ]; anxiety[  ];  Endocrine: diabetes[  ];  thyroid dysfunction[  ];  Other:  Past Medical History  Diagnosis Date  . Hypertension   . Diabetes mellitus without complication   . UTI (lower urinary tract infection)   . Depression   . PCOS (polycystic ovarian syndrome)   . Obesity      (  Not in a hospital admission)      Infusions: . diltiazem (CARDIZEM) infusion      Allergies  Allergen Reactions  . Sulfa Antibiotics Hives    Social History   Social History  . Marital Status: Single    Spouse Name: N/A  . Number of Children: N/A  . Years of Education: N/A   Occupational History  . Not on file.   Social History Main Topics  . Smoking status: Former Smoker -- 0.00 packs/day    Types: Cigarettes  . Smokeless tobacco: Not on file     Comment: vapor  . Alcohol Use: Yes     Comment: \  . Drug Use: No  . Sexual Activity: Not on file   Other Topics Concern  . Not on file   Social History Narrative    Family History  Problem Relation Age of Onset  . Heart failure Father   . Heart attack Father     PHYSICAL EXAM: Filed Vitals:   04/25/15 0017  BP: 132/88  Pulse: 100  Temp: 98.4 F (36.9 C)  Resp: 20    No intake or output data in the 24 hours ending 04/25/15 0200  General:  Overweight, well appearing. No respiratory difficulty HEENT: normal Neck: supple. no JVD. Carotids 2+ bilat; no bruits. No lymphadenopathy or thryomegaly appreciated. Cor: PMI nondisplaced. Tachy, irregular. No rubs, gallops or murmurs. Lungs: clear Abdomen: soft, nontender, nondistended.  No bruits or masses. Good bowel sounds. Extremities: no cyanosis, clubbing, rash, edema, multiple tattoos.  Neuro: alert & oriented x 3, cranial nerves grossly intact. moves all 4 extremities w/o  difficulty. Affect pleasant.  ECG:  AF RVR, nonspecific ST-T changes  Results for orders placed or performed during the hospital encounter of 04/25/15 (from the past 24 hour(s))  Basic metabolic panel     Status: Abnormal   Collection Time: 04/25/15 12:17 AM  Result Value Ref Range   Sodium 137 135 - 145 mmol/L   Potassium 4.1 3.5 - 5.1 mmol/L   Chloride 106 101 - 111 mmol/L   CO2 19 (L) 22 - 32 mmol/L   Glucose, Bld 262 (H) 65 - 99 mg/dL   BUN 12 6 - 20 mg/dL   Creatinine, Ser 6.96 0.44 - 1.00 mg/dL   Calcium 9.1 8.9 - 29.5 mg/dL   GFR calc non Af Amer >60 >60 mL/min   GFR calc Af Amer >60 >60 mL/min   Anion gap 12 5 - 15  CBC     Status: Abnormal   Collection Time: 04/25/15 12:17 AM  Result Value Ref Range   WBC 15.5 (H) 4.0 - 10.5 K/uL   RBC 5.00 3.87 - 5.11 MIL/uL   Hemoglobin 16.2 (H) 12.0 - 15.0 g/dL   HCT 28.4 (H) 13.2 - 44.0 %   MCV 92.6 78.0 - 100.0 fL   MCH 32.4 26.0 - 34.0 pg   MCHC 35.0 30.0 - 36.0 g/dL   RDW 10.2 72.5 - 36.6 %   Platelets 366 150 - 400 K/uL  Troponin I     Status: None   Collection Time: 04/25/15 12:17 AM  Result Value Ref Range   Troponin I <0.03 <0.031 ng/mL   Dg Chest 2 View  04/25/2015   CLINICAL DATA:  Palpitations, tachycardia, shortness of breath. Symptoms tonight.  EXAM: CHEST  2 VIEW  COMPARISON:  None.  FINDINGS: The cardiomediastinal contours are normal. The lungs are clear. Pulmonary vasculature is normal. No consolidation, pleural effusion, or pneumothorax. No  acute osseous abnormalities are seen.  IMPRESSION: No acute pulmonary process.   Electronically Signed   By: Rubye Oaks M.D.   On: 04/25/2015 01:10     ASSESSMENT: 39 yo woman with HTN, DM presenting with palpitations and found to be in new onset AF with RVR (possible previous episode 8-9 yrs ago) .  PLAN/DISCUSSION: Agree with Dilt to provide AVN blockade If she remains in AF 30-60 minutes after Dilt, will plan on chemical cardioversion with flecainide 300 mg PO x 1   Her CHADSVASc score is 3 (HTN, DM and female sex), however, as she is young with a first documented occurrence and symptomatic will plan on holding off on anticoagulation unless she has recurrence.  Follow up with cardiology

## 2015-04-25 NOTE — ED Provider Notes (Signed)
CSN: [id number]   Arrival date & time 04/25/15 0007  History  By signing my name below, I, Julia Dickerson, attest that this documentation has been prepared under the direction and in the presence of Shon Baton, MD. Electronically Signed: Bethel Dickerson, ED Scribe. 04/25/2015. 6:08 AM.  Chief Complaint  Patient presents with  . Palpitations    HPI The history is provided by the patient. No language interpreter was used.   Julia Dickerson is a 39 y.o. female with PMHx of HTN, DM, and obesity who presents to the Emergency Department complaining of constant racing and intermittently fluttering palpitations with onset around 10:30 PM. Pt had gotten out of bed to use the restroom at the onset of symptoms.  Associated symptoms include light headedness. Pt denies nausea, chest pain, SOB. She has had similar symptoms in the past for which she saw cardiology in [place] but no cause was determined. No personal history of cardiac disease. Family history of MI and CHF. No history of DVT/PE,  recent long travel by car or plane, surgery or hospitalization, bone fracture, birth control or hormonal therapy.    Past Medical History  Diagnosis Date  . Hypertension   . Diabetes mellitus without complication   . UTI (lower urinary tract infection)   . Depression   . PCOS (polycystic ovarian syndrome)   . Obesity     Past Surgical History  Procedure Laterality Date  . Dilation and curettage of uterus    . Wisdom tooth extraction      Family History  Problem Relation Age of Onset  . Heart failure Father   . Heart attack Father     Social History  Substance Use Topics  . Smoking status: Former Smoker -- 0.00 packs/day    Types: Cigarettes  . Smokeless tobacco: None     Comment: vapor  . Alcohol Use: Yes     Comment: \     Review of Systems  Constitutional: Negative for fever.  Respiratory: Negative for cough, chest tightness and shortness of breath.   Cardiovascular: Positive for  palpitations. Negative for chest pain and leg swelling.  Gastrointestinal: Negative for nausea, vomiting and abdominal pain.  Genitourinary: Negative for dysuria.  Musculoskeletal: Negative for back pain.  Skin: Negative for wound.  Neurological: Positive for light-headedness. Negative for headaches.  Psychiatric/Behavioral: Negative for confusion.  All other systems reviewed and are negative.  Home Medications   Prior to Admission medications   Medication Sig Start Date End Date Taking? Authorizing Provider  atorvastatin (LIPITOR) 20 MG tablet Take 1 tablet (20 mg total) by mouth daily. 07/13/14   Thermon Leyland, NP  buPROPion (WELLBUTRIN XL) 150 MG 24 hr tablet Take 1 tablet (150 mg total) by mouth daily. 07/13/14   Thermon Leyland, NP  cyclobenzaprine (FLEXERIL) 5 MG tablet Take 1 tablet (5 mg total) by mouth 3 (three) times daily as needed for muscle spasms. 02/24/15   Linna Hoff, MD  diclofenac sodium (VOLTAREN) 1 % GEL Apply 2 g topically 4 (four) times daily. 12/26/14   Rodolph Bong, MD  escitalopram (LEXAPRO) 10 MG tablet Take 1 tablet (10 mg total) by mouth daily. 07/13/14   Thermon Leyland, NP  ibuprofen (ADVIL,MOTRIN) 200 MG tablet Take 200 mg by mouth every 6 (six) hours as needed for mild pain.    Historical Provider, MD  lisinopril (PRINIVIL,ZESTRIL) 10 MG tablet Take 1 tablet (10 mg total) by mouth daily. 07/13/14   Thermon Leyland, NP  meloxicam (MOBIC) 7.5 MG tablet Take 1 tablet (7.5 mg total) by mouth daily as needed for pain. 09/27/14   Raeford Razor, MD  metFORMIN (GLUCOPHAGE) 1000 MG tablet Take 1 tablet (1,000 mg total) by mouth 2 (two) times daily with a meal. 07/13/14   Thermon Leyland, NP  methylPREDNISolone (MEDROL DOSEPAK) 4 MG TBPK tablet follow package directions, start on tues and take until finished 02/24/15   Linna Hoff, MD  metoCLOPramide (REGLAN) 5 MG tablet Take 1 tablet (5 mg total) by mouth 3 (three) times daily before meals. 01/23/15   Ozella Rocks, MD  naproxen  (NAPROSYN) 500 MG tablet Take 1 tablet (500 mg total) by mouth 2 (two) times daily. 12/26/14   Rodolph Bong, MD  traMADol (ULTRAM) 50 MG tablet Take 1 tablet (50 mg total) by mouth every 6 (six) hours as needed. 09/27/14   Raeford Razor, MD  traZODone (DESYREL) 50 MG tablet Take 1 tablet (50 mg total) by mouth at bedtime as needed for sleep. 07/13/14   Thermon Leyland, NP    Allergies  Sulfa antibiotics  Triage Vitals: BP 132/88 mmHg  Pulse 100  Temp(Src) 98.4 F (36.9 C) (Oral)  Resp 20  SpO2 98%  Physical Exam  Constitutional: She is oriented to person, place, and time. She appears well-developed and well-nourished.  Obese  HENT:  Head: Normocephalic and atraumatic.  Cardiovascular: Normal heart sounds.   No murmur heard. Tachycardia, irregular rhythm  Pulmonary/Chest: Effort normal. No respiratory distress.  Abdominal: Soft. Bowel sounds are normal. There is no tenderness. There is no rebound.  Musculoskeletal: She exhibits no edema.  Neurological: She is alert and oriented to person, place, and time.  Skin: Skin is warm and dry.  Psychiatric: She has a normal mood and affect.  Nursing note and vitals reviewed.   ED Course  Procedures  CRITICAL CARE Performed by: Shon Baton   Total critical care time: 45 min  Critical care time was exclusive of separately billable procedures and treating other patients.  Critical care was necessary to treat or prevent imminent or life-threatening deterioration.  Critical care was time spent personally by me on the following activities: development of treatment plan with patient and/or surrogate as well as nursing, discussions with consultants, evaluation of patient's response to treatment, examination of patient, obtaining history from patient or surrogate, ordering and performing treatments and interventions, ordering and review of laboratory studies, ordering and review of radiographic studies, pulse oximetry and re-evaluation of  patient's condition.   DIAGNOSTIC STUDIES: Oxygen Saturation is 98% on RA, normal by my interpretation.    COORDINATION OF CARE: 12:47 AM Discussed treatment plan which includes CXR, lab work, EKG, and Diltiazem with pt at bedside and pt agreed to plan.  1:48 AM-Consult complete with Dr. Alvarez(Cardiology Fellow). Patient case explained and discussed. Plan for Diltiazem infusion for 1 hour and then Flecainide. Call ended at 1:55 AM.  2:28 AM D/w Dr. Onalee Hua who is in the department and has seen the pt. Plan to hold anticoagulation and continue with the plan for Diltiazem and Flecainide.   4:48 AM Patient converted.  Diltiazem drip discontinued. Patient continued to be monitored. If stable will be discharged.   Labs Reviewed  BASIC METABOLIC PANEL - Abnormal; Notable for the following:    CO2 19 (*)    Glucose, Bld 262 (*)    All other components within normal limits  CBC - Abnormal; Notable for the following:    WBC 15.5 (*)  Hemoglobin 16.2 (*)    HCT 46.3 (*)    All other components within normal limits  TROPONIN I  D-DIMER, QUANTITATIVE (NOT AT [facility])  POC URINE PREG, ED    Imaging Review Dg Chest 2 View  04/25/2015   CLINICAL DATA:  Palpitations, tachycardia, shortness of breath. Symptoms tonight.  EXAM: CHEST  2 VIEW  COMPARISON:  None.  FINDINGS: The cardiomediastinal contours are normal. The lungs are clear. Pulmonary vasculature is normal. No consolidation, pleural effusion, or pneumothorax. No acute osseous abnormalities are seen.  IMPRESSION: No acute pulmonary process.   Electronically Signed   By: Rubye Oaks M.D.   On: 04/25/2015 01:10   EKG Interpretation #2  Date/Time:  Friday April 25 2015 05:07:48 EDT Ventricular Rate:  92 PR Interval:  142 QRS Duration: 98 QT Interval:  391 QTC Calculation: 484 R Axis:   63 Text Interpretation:  Sinus rhythm Probable anteroseptal infarct, old Atrial fibrillation resolved Similar to prior sinus EKG Reconfirmed  by HORTON  MD, Toni Amend ([id number]) on 04/25/2015 5:20:58 AM   EKG Interpretations #1  ED ECG REPORT   Date: 04/25/2015  Rate: 177  Rhythm: atrial fibrillation  QRS Axis: normal  Intervals: normal  ST/T Wave abnormalities: nonspecific ST changes  Conduction Disutrbances:none  Narrative Interpretation:   Old EKG Reviewed: changes noted  I have personally reviewed the EKG tracing and agree with the computerized printout as noted.   Medications  diltiazem (CARDIZEM) injection 10 mg (10 mg Intravenous Given 04/25/15 0103)  sodium chloride 0.9 % bolus 1,000 mL (0 mLs Intravenous Stopped 04/25/15 0358)  adenosine (ADENOCARD) 6 MG/2ML injection (6 mg  Given 04/25/15 0139)  diltiazem (CARDIZEM) 100 mg in dextrose 5 % 100 mL (1 mg/mL) infusion (0 mg/hr Intravenous Stopped 04/25/15 0504)  flecainide (TAMBOCOR) tablet 300 mg (300 mg Oral Given 04/25/15 0319)  diltiazem (CARDIZEM) 1 mg/mL load via infusion 10 mg (10 mg Intravenous Given 04/25/15 0234)     MDM   Final diagnoses:  Atrial fibrillation with RVR    Patient presents with palpitations. Nontoxic on exam. Appears to be in atrial fibrillation with RVR. Questionable history of palpitations in the past but no definitive diagnosis. Patient was initially given a bolus of diltiazem with minimal response. Given how fast her rate is, patient was given adenosine to better characterize her rhythm. She did not convert with adenosine but it was apparent that she was in atrial fibrillation. She was started on a diltiazem drip. Cardiology was consulted. Patient likely has only been in atrial fibrillation for less than 4 hours. She is otherwise young. She has a history of hypertension, diabetes and his female which puts her chads score at 3; however, she would likely be a good candidate for cardioversion. Discuss with cardiology. Will try to chemically cardiovert with flecainide. Patient was given 300 mg a flecainide. She converted approximate one half hours  after administration. She is stable. Symptoms have resolved. Lab work is reassuring including troponin and d-dimer. Patient will need to follow-up closely with cardiology for formal evaluation. Will hold on anticoagulation at this time per cardiology consult.  Patient and her husband were given strict return precautions.  After history, exam, and medical workup I feel the patient has been appropriately medically screened and is safe for discharge home. Pertinent diagnoses were discussed with the patient. Patient was given return precautions.  I personally performed the services described in this documentation, which was scribed in my presence. The recorded information has been reviewed and is  accurate.    Shon Baton, MD 04/25/15 [id number]

## 2015-04-25 NOTE — Discharge Instructions (Signed)
You were seen today and found to be in atrial fibrillation. You were converted with medications. You need to follow-up closely with cardiology for formal and full evaluation.  IF you have new or recurrent symptoms, you should be re-evaluated immediately.  Atrial Fibrillation Atrial fibrillation is a type of irregular heart rhythm (arrhythmia). During atrial fibrillation, the upper chambers of the heart (atria) quiver continuously in a chaotic pattern. This causes an irregular and often rapid heart rate.  Atrial fibrillation is the result of the heart becoming overloaded with disorganized signals that tell it to beat. These signals are normally released one at a time by a part of the right atrium called the sinoatrial node. They then travel from the atria to the lower chambers of the heart (ventricles), causing the atria and ventricles to contract and pump blood as they pass. In atrial fibrillation, parts of the atria outside of the sinoatrial node also release these signals. This results in two problems. First, the atria receive so many signals that they do not have time to fully contract. Second, the ventricles, which can only receive one signal at a time, beat irregularly and out of rhythm with the atria.  There are three types of atrial fibrillation:   Paroxysmal. Paroxysmal atrial fibrillation starts suddenly and stops on its own within a week.  Persistent. Persistent atrial fibrillation lasts for more than a week. It may stop on its own or with treatment.  Permanent. Permanent atrial fibrillation does not go away. Episodes of atrial fibrillation may lead to permanent atrial fibrillation. Atrial fibrillation can prevent your heart from pumping blood normally. It increases your risk of stroke and can lead to heart failure.  CAUSES   Heart conditions, including a heart attack, heart failure, coronary artery disease, and heart valve conditions.   Inflammation of the sac that surrounds the heart  (pericarditis).  Blockage of an artery in the lungs (pulmonary embolism).  Pneumonia or other infections.  Chronic lung disease.  Thyroid problems, especially if the thyroid is overactive (hyperthyroidism).  Caffeine, excessive alcohol use, and use of some illegal drugs.   Use of some medicines, including certain decongestants and diet pills.  Heart surgery.   Birth defects.  Sometimes, no cause can be found. When this happens, the atrial fibrillation is called lone atrial fibrillation. The risk of complications from atrial fibrillation increases if you have lone atrial fibrillation and you are age 62 years or older. RISK FACTORS  Heart failure.  Coronary artery disease.  Diabetes mellitus.   High blood pressure (hypertension).   Obesity.   Other arrhythmias.   Increased age. SIGNS AND SYMPTOMS   A feeling that your heart is beating rapidly or irregularly.   A feeling of discomfort or pain in your chest.   Shortness of breath.   Sudden light-headedness or weakness.   Getting tired easily when exercising.   Urinating more often than normal (mainly when atrial fibrillation first begins).  In paroxysmal atrial fibrillation, symptoms may start and suddenly stop. DIAGNOSIS  Your health care provider may be able to detect atrial fibrillation when taking your pulse. Your health care provider may have you take a test called an ambulatory electrocardiogram (ECG). An ECG records your heartbeat patterns over a 24-hour period. You may also have other tests, such as:  Transthoracic echocardiogram (TTE). During echocardiography, sound waves are used to evaluate how blood flows through your heart.  Transesophageal echocardiogram (TEE).  Stress test. There is more than one type of stress test. If  a stress test is needed, ask your health care provider about which type is best for you.  Chest X-ray exam.  Blood tests.  Computed tomography (CT). TREATMENT    Treatment may include:  Treating any underlying conditions. For example, if you have an overactive thyroid, treating the condition may correct atrial fibrillation.  Taking medicine. Medicines may be given to control a rapid heart rate or to prevent blood clots, heart failure, or a stroke.  Having a procedure to correct the rhythm of the heart:  Electrical cardioversion. During electrical cardioversion, a controlled, low-energy shock is delivered to the heart through your skin. If you have chest pain, very low blood pressure, or sudden heart failure, this procedure may need to be done as an emergency.  Catheter ablation. During this procedure, heart tissues that send the signals that cause atrial fibrillation are destroyed.  Surgical ablation. During this surgery, thin lines of heart tissue that carry the abnormal signals are destroyed. This procedure can either be an open-heart surgery or a minimally invasive surgery. With the minimally invasive surgery, small cuts are made to access the heart instead of a large opening.  Pulmonary venous isolation. During this surgery, tissue around the veins that carry blood from the lungs (pulmonary veins) is destroyed. This tissue is thought to carry the abnormal signals. HOME CARE INSTRUCTIONS   Take medicines only as directed by your health care provider. Some medicines can make atrial fibrillation worse or recur.  If blood thinners were prescribed by your health care provider, take them exactly as directed. Too much blood-thinning medicine can cause bleeding. If you take too little, you will not have the needed protection against stroke and other problems.  Perform blood tests at home if directed by your health care provider. Perform blood tests exactly as directed.  Quit smoking if you smoke.  Do not drink alcohol.  Do not drink caffeinated beverages such as coffee, soda, and some teas. You may drink decaffeinated coffee, soda, or tea.    Maintain a healthy weight.Do not use diet pills unless your health care provider approves. They may make heart problems worse.   Follow diet instructions as directed by your health care provider.  Exercise regularly as directed by your health care provider.  Keep all follow-up visits as directed by your health care provider. This is important. PREVENTION  The following substances can cause atrial fibrillation to recur:   Caffeinated beverages.  Alcohol.  Certain medicines, especially those used for breathing problems.  Certain herbs and herbal medicines, such as those containing ephedra or ginseng.  Illegal drugs, such as cocaine and amphetamines. Sometimes medicines are given to prevent atrial fibrillation from recurring. Proper treatment of any underlying condition is also important in helping prevent recurrence.  SEEK MEDICAL CARE IF:  You notice a change in the rate, rhythm, or strength of your heartbeat.  You suddenly begin urinating more frequently.  You tire more easily when exerting yourself or exercising. SEEK IMMEDIATE MEDICAL CARE IF:   You have chest pain, abdominal pain, sweating, or weakness.  You feel nauseous.  You have shortness of breath.  You suddenly have swollen feet and ankles.  You feel dizzy.  Your face or limbs feel numb or weak.  You have a change in your vision or speech. MAKE SURE YOU:   Understand these instructions.  Will watch your condition.  Will get help right away if you are not doing well or get worse. Document Released: 07/12/2005 Document Revised: 11/26/2013 Document  Reviewed: 08/22/2012 ExitCare Patient Information 2015 [place], [place]. This information is not intended to replace advice given to you by your health care provider. Make sure you discuss any questions you have with your health care provider.

## 2015-05-06 ENCOUNTER — Other Ambulatory Visit: Payer: Self-pay | Admitting: Obstetrics & Gynecology

## 2015-05-13 NOTE — Patient Instructions (Addendum)
Your procedure is scheduled on:  Friday, May 16, 2015  Enter through the Main Entrance of [facility] at:  7:30 a.m.  Pick up the phone at the desk and dial [phone].  Call this number if you have problems the morning of surgery: [phone].  Remember: Do NOT eat food or drink after:  Midnight Thursday Take these medicines the morning of surgery with a SIP OF WATER:  ATORVASTATIN, LEXAPRO, LISINOPRIL  *DO NOT TAKE METFORMIN 24 HOURS PRIOR TO SURGERY  *STOP TAKING FISH OIL  Do NOT wear jewelry (body piercing), metal hair clips/bobby pins, make-up, or nail polish. Do NOT wear lotions, powders, or perfumes.  You may wear deoderant. Do NOT shave for 48 hours prior to surgery. Do NOT bring valuables to the hospital. Contacts, dentures, or bridgework may not be worn into surgery. Have a responsible adult drive you home and stay with you for 24 hours after your procedure      Excuse from Work, School, or Physical Activity Julia Dickerson needs to be excused from: __x___ Work _____ [employer] _____ Physical activity  she may return to work 05/15/2015 [facility].  Health Care Provider Name (printed): ________________________________________  Health Care Provider (signature): ___________________________________________ Date: ________________   This information is not intended to replace advice given to you by your health care provider. Make sure you discuss any questions you have with your health care provider.   Document Released: 01/05/2001 Document Revised: 08/02/2014 Document Reviewed: 02/11/2014 Elsevier Interactive Patient Education [employer]2016 [employer].

## 2015-05-14 ENCOUNTER — Other Ambulatory Visit: Payer: Self-pay

## 2015-05-14 ENCOUNTER — Encounter (HOSPITAL_COMMUNITY)
Admission: RE | Admit: 2015-05-14 | Discharge: 2015-05-14 | Disposition: A | Discharge status: Home / Self Care | Payer: 59 | Source: Ambulatory Visit | Attending: Obstetrics & Gynecology | Admitting: Obstetrics & Gynecology

## 2015-05-14 ENCOUNTER — Encounter (HOSPITAL_COMMUNITY): Payer: Self-pay

## 2015-05-14 DIAGNOSIS — I4891 Unspecified atrial fibrillation: Secondary | ICD-10-CM

## 2015-05-14 DIAGNOSIS — Z01818 Encounter for other preprocedural examination: Secondary | ICD-10-CM | POA: Insufficient documentation

## 2015-05-14 HISTORY — DX: Cardiac arrhythmia, unspecified: I49.9

## 2015-05-14 HISTORY — DX: Unspecified atrial fibrillation: I48.91

## 2015-05-14 HISTORY — DX: Anxiety disorder, unspecified: F41.9

## 2015-05-14 HISTORY — DX: Gastro-esophageal reflux disease without esophagitis: K21.9

## 2015-05-14 LAB — CBC
HCT: 42.2 % (ref 36.0–46.0)
Hemoglobin: 15.1 g/dL — ABNORMAL HIGH (ref 12.0–15.0)
MCH: 32.8 pg (ref 26.0–34.0)
MCHC: 35.8 g/dL (ref 30.0–36.0)
MCV: 91.5 fL (ref 78.0–100.0)
Platelets: 282 10*3/uL (ref 150–400)
RBC: 4.61 MIL/uL (ref 3.87–5.11)
RDW: 13.7 % (ref 11.5–15.5)
WBC: 11 10*3/uL — ABNORMAL HIGH (ref 4.0–10.5)

## 2015-05-14 LAB — COMPREHENSIVE METABOLIC PANEL
ALT: 33 U/L (ref 14–54)
AST: 28 U/L (ref 15–41)
Albumin: 3.9 g/dL (ref 3.5–5.0)
Alkaline Phosphatase: 67 U/L (ref 38–126)
Anion gap: 7 (ref 5–15)
BUN: 8 mg/dL (ref 6–20)
CO2: 23 mmol/L (ref 22–32)
Calcium: 9 mg/dL (ref 8.9–10.3)
Chloride: 104 mmol/L (ref 101–111)
Creatinine, Ser: 0.61 mg/dL (ref 0.44–1.00)
GFR calc Af Amer: >60 mL/min (ref >60)
GFR calc non Af Amer: >60 mL/min (ref >60)
Glucose, Bld: 153 mg/dL — ABNORMAL HIGH (ref 65–99)
Potassium: 4 mmol/L (ref 3.5–5.1)
Sodium: 134 mmol/L — ABNORMAL LOW (ref 135–145)
Total Bilirubin: 0.7 mg/dL (ref 0.3–1.2)
Total Protein: 7.1 g/dL (ref 6.5–8.1)

## 2015-05-14 NOTE — Progress Notes (Signed)
Patient presented for preop for laparoscopic tubal ligation.  Blood pressures were elevated 152/103 and 154/110 patient stated she had not taken her blood pressure medication this morning.  Abnormal EKG results today with normal sinus rhythm anteroseptal infarct noted.  She denies shortness of breath walking to mailbox and back, denies chest pain, however patient had a recent episode of new onset atrial fib 04/25/2015.  She is scheduled for a cardiology consult 05/23/2015.  Dr. Cristela BlueKyle Jackson consulted with patient request will delay surgery until complete cardiac workup with clearance in performed.  Patient verbalized understanding.  Shanelle, surgery scheduler at Dr. Sharol Roussellavoie's office made aware.

## 2015-05-16 ENCOUNTER — Ambulatory Visit (HOSPITAL_COMMUNITY): Admission: RE | Admit: 2015-05-16 | Payer: 59 | Source: Ambulatory Visit | Admitting: Obstetrics & Gynecology

## 2015-05-16 ENCOUNTER — Encounter (HOSPITAL_COMMUNITY): Admission: RE | Payer: Self-pay | Source: Ambulatory Visit

## 2015-05-16 SURGERY — LIGATION, FALLOPIAN TUBE, LAPAROSCOPIC
Anesthesia: General | Laterality: Bilateral

## 2015-05-23 ENCOUNTER — Encounter: Payer: 59 | Admitting: Cardiology

## 2015-06-05 ENCOUNTER — Emergency Department (HOSPITAL_COMMUNITY)
Admission: EM | Admit: 2015-06-05 | Discharge: 2015-06-05 | Disposition: A | Discharge status: Home / Self Care | Payer: 59 | Attending: Emergency Medicine | Admitting: Emergency Medicine

## 2015-06-05 ENCOUNTER — Encounter (HOSPITAL_COMMUNITY): Payer: Self-pay | Admitting: Emergency Medicine

## 2015-06-05 ENCOUNTER — Emergency Department (HOSPITAL_COMMUNITY): Payer: 59

## 2015-06-05 DIAGNOSIS — E119 Type 2 diabetes mellitus without complications: Secondary | ICD-10-CM | POA: Insufficient documentation

## 2015-06-05 DIAGNOSIS — Z79899 Other long term (current) drug therapy: Secondary | ICD-10-CM | POA: Insufficient documentation

## 2015-06-05 DIAGNOSIS — Z87440 Personal history of urinary (tract) infections: Secondary | ICD-10-CM | POA: Insufficient documentation

## 2015-06-05 DIAGNOSIS — Z791 Long term (current) use of non-steroidal anti-inflammatories (NSAID): Secondary | ICD-10-CM | POA: Insufficient documentation

## 2015-06-05 DIAGNOSIS — I4891 Unspecified atrial fibrillation: Secondary | ICD-10-CM | POA: Insufficient documentation

## 2015-06-05 DIAGNOSIS — E669 Obesity, unspecified: Secondary | ICD-10-CM | POA: Insufficient documentation

## 2015-06-05 DIAGNOSIS — Z720 Tobacco use: Secondary | ICD-10-CM | POA: Insufficient documentation

## 2015-06-05 DIAGNOSIS — Z8742 Personal history of other diseases of the female genital tract: Secondary | ICD-10-CM | POA: Insufficient documentation

## 2015-06-05 DIAGNOSIS — I10 Essential (primary) hypertension: Secondary | ICD-10-CM | POA: Insufficient documentation

## 2015-06-05 DIAGNOSIS — F419 Anxiety disorder, unspecified: Secondary | ICD-10-CM | POA: Insufficient documentation

## 2015-06-05 DIAGNOSIS — Z7984 Long term (current) use of oral hypoglycemic drugs: Secondary | ICD-10-CM | POA: Insufficient documentation

## 2015-06-05 DIAGNOSIS — Z8719 Personal history of other diseases of the digestive system: Secondary | ICD-10-CM | POA: Insufficient documentation

## 2015-06-05 DIAGNOSIS — F329 Major depressive disorder, single episode, unspecified: Secondary | ICD-10-CM | POA: Insufficient documentation

## 2015-06-05 DIAGNOSIS — M25512 Pain in left shoulder: Secondary | ICD-10-CM | POA: Insufficient documentation

## 2015-06-05 DIAGNOSIS — R0602 Shortness of breath: Secondary | ICD-10-CM | POA: Insufficient documentation

## 2015-06-05 LAB — BASIC METABOLIC PANEL
Anion gap: 13 (ref 5–15)
BUN: 14 mg/dL (ref 6–20)
CO2: 24 mmol/L (ref 22–32)
Calcium: 9.9 mg/dL (ref 8.9–10.3)
Chloride: 97 mmol/L — ABNORMAL LOW (ref 101–111)
Creatinine, Ser: 0.91 mg/dL (ref 0.44–1.00)
GFR calc Af Amer: >60 mL/min (ref >60)
GFR calc non Af Amer: >60 mL/min (ref >60)
Glucose, Bld: 327 mg/dL — ABNORMAL HIGH (ref 65–99)
Potassium: 3.6 mmol/L (ref 3.5–5.1)
Sodium: 134 mmol/L — ABNORMAL LOW (ref 135–145)

## 2015-06-05 LAB — CBC
HCT: 43.8 % (ref 36.0–46.0)
Hemoglobin: 15.4 g/dL — ABNORMAL HIGH (ref 12.0–15.0)
MCH: 32.3 pg (ref 26.0–34.0)
MCHC: 35.2 g/dL (ref 30.0–36.0)
MCV: 91.8 fL (ref 78.0–100.0)
Platelets: 303 10*3/uL (ref 150–400)
RBC: 4.77 MIL/uL (ref 3.87–5.11)
RDW: 12.8 % (ref 11.5–15.5)
WBC: 15.3 10*3/uL — ABNORMAL HIGH (ref 4.0–10.5)

## 2015-06-05 LAB — TSH: TSH: 3.952 u[IU]/mL (ref 0.350–4.500)

## 2015-06-05 MED ORDER — DILTIAZEM LOAD VIA INFUSION
20.0000 mg | Freq: Once | INTRAVENOUS | Status: DC
  Filled 2015-06-05: qty 20

## 2015-06-05 MED ORDER — DILTIAZEM HCL 100 MG IV SOLR: 5.0000 mg/h | INTRAVENOUS | Status: DC

## 2015-06-05 MED ORDER — DILTIAZEM HCL 60 MG PO TABS: ORAL_TABLET | ORAL | Status: DC

## 2015-06-05 NOTE — Discharge Instructions (Signed)
Take a full-strength aspirin 325 mg tablet daily. If you have palpitations take one of the diltiazem tablets. If symptoms do not resolve, return to the ER for reevaluation.  Atrial Fibrillation Atrial fibrillation is a type of irregular or rapid heartbeat (arrhythmia). In atrial fibrillation, the heart quivers continuously in a chaotic pattern. This occurs when parts of the heart receive disorganized signals that make the heart unable to pump blood normally. This can increase the risk for stroke, heart failure, and other heart-related conditions. There are different types of atrial fibrillation, including:  Paroxysmal atrial fibrillation. This type starts suddenly, and it usually stops on its own shortly after it starts.  Persistent atrial fibrillation. This type often lasts longer than a week. It may stop on its own or with treatment.  Long-lasting persistent atrial fibrillation. This type lasts longer than 12 months.  Permanent atrial fibrillation. This type does not go away. Talk with your health care provider to learn about the type of atrial fibrillation that you have. CAUSES This condition is caused by some heart-related conditions or procedures, including:  A heart attack.  Coronary artery disease.  Heart failure.  Heart valve conditions.  High blood pressure.  Inflammation of the sac that surrounds the heart (pericarditis).  Heart surgery.  Certain heart rhythm disorders, such as Wolf-Parkinson-White syndrome. Other causes include:  Pneumonia.  Obstructive sleep apnea.  Blockage of an artery in the lungs (pulmonary embolism, or PE).  Lung cancer.  Chronic lung disease.  Thyroid problems, especially if the thyroid is overactive (hyperthyroidism).  Caffeine.  Excessive alcohol use or illegal drug use.  Use of some medicines, including certain decongestants and diet pills. Sometimes, the cause cannot be found. RISK FACTORS This condition is more likely to  develop in:  People who are older in age.  People who smoke.  People who have diabetes mellitus.  People who are overweight (obese).  Athletes who exercise vigorously. SYMPTOMS Symptoms of this condition include:  A feeling that your heart is beating rapidly or irregularly.  A feeling of discomfort or pain in your chest.  Shortness of breath.  Sudden light-headedness or weakness.  Getting tired easily during exercise. In some cases, there are no symptoms. DIAGNOSIS Your health care provider may be able to detect atrial fibrillation when taking your pulse. If detected, this condition may be diagnosed with:  An electrocardiogram (ECG).  A Holter monitor test that records your heartbeat patterns over a 24-hour period.  Transthoracic echocardiogram (TTE) to evaluate how blood flows through your heart.  Transesophageal echocardiogram (TEE) to view more detailed images of your heart.  A stress test.  Imaging tests, such as a CT scan or chest X-ray.  Blood tests. TREATMENT The main goals of treatment are to prevent blood clots from forming and to keep your heart beating at a normal rate and rhythm. The type of treatment that you receive depends on many factors, such as your underlying medical conditions and how you feel when you are experiencing atrial fibrillation. This condition may be treated with:  Medicine to slow down the heart rate, bring the heart's rhythm back to normal, or prevent clots from forming.  Electrical cardioversion. This is a procedure that resets your heart's rhythm by delivering a controlled, low-energy shock to the heart through your skin.  Different types of ablation, such as catheter ablation, catheter ablation with pacemaker, or surgical ablation. These procedures destroy the heart tissues that send abnormal signals. When the pacemaker is used, it is placed under  your skin to help your heart beat in a regular rhythm. HOME CARE INSTRUCTIONS  Take  over-the counter and prescription medicines only as told by your health care provider.  If your health care provider prescribed a blood-thinning medicine (anticoagulant), take it exactly as told. Taking too much blood-thinning medicine can cause bleeding. If you do not take enough blood-thinning medicine, you will not have the protection that you need against stroke and other problems.  Do not use tobacco products, including cigarettes, chewing tobacco, and e-cigarettes. If you need help quitting, ask your health care provider.  If you have obstructive sleep apnea, manage your condition as told by your health care provider.  Do not drink alcohol.  Do not drink beverages that contain caffeine, such as coffee, soda, and tea.  Maintain a healthy weight. Do not use diet pills unless your health care provider approves. Diet pills may make heart problems worse.  Follow diet instructions as told by your health care provider.  Exercise regularly as told by your health care provider.  Keep all follow-up visits as told by your health care provider. This is important. PREVENTION  Avoid drinking beverages that contain caffeine or alcohol.  Avoid certain medicines, especially medicines that are used for breathing problems.  Avoid certain herbs and herbal medicines, such as those that contain ephedra or ginseng.  Do not use illegal drugs, such as cocaine and amphetamines.  Do not smoke.  Manage your high blood pressure. SEEK MEDICAL CARE IF:  You notice a change in the rate, rhythm, or strength of your heartbeat.  You are taking an anticoagulant and you notice increased bruising.  You tire more easily when you exercise or exert yourself. SEEK IMMEDIATE MEDICAL CARE IF:  You have chest pain, abdominal pain, sweating, or weakness.  You feel nauseous.  You notice blood in your vomit, bowel movement, or urine.  You have shortness of breath.  You suddenly have swollen feet and  ankles.  You feel dizzy.  You have sudden weakness or numbness of the face, arm, or leg, especially on one side of the body.  You have trouble speaking, trouble understanding, or both (aphasia).  Your face or your eyelid droops on one side. These symptoms may represent a serious problem that is an emergency. Do not wait to see if the symptoms will go away. Get medical help right away. Call your local emergency services (911 in the [place] Do not drive yourself to the hospital.   This information is not intended to replace advice given to you by your health care provider. Make sure you discuss any questions you have with your health care provider.   Document Released: 07/12/2005 Document Revised: 04/02/2015 Document Reviewed: 11/06/2014 Elsevier Interactive Patient Education [employer]2016 [employer] Inc.

## 2015-06-05 NOTE — ED Provider Notes (Signed)
CSN: [id number]     Arrival date & time 06/05/15  0139 History  By signing my name below, I, Julia Dickerson, attest that this documentation has been prepared under the direction and in the presence of [name], MD.  Electronically Signed: Netta Dickerson, ED Scribe. 06/05/2015. 2:04 AM.    Chief Complaint  Patient presents with  . Palpitations   The history is provided by the patient and the spouse. No language interpreter was used.   HPI Comments: Julia Dickerson is a 39 y.o. female who presents to the Emergency Department complaining of an episode of palpitations described as "heart racing" that began about 1 hour ago and is gradually resolving. She states she was in bed when the symptoms began and has associated intermittent SOB and posterior left shoulder pain. She reports identical symptoms on 04/25/15 when she was in atrial fibrillation and required medicine to convert to sinus rhythm. She was discharged home and was not started on any long term medication at that time. She denies weakness, lightheadedness, dizziness.   Past Medical History  Diagnosis Date  . Hypertension   . Diabetes mellitus without complication (HCC)   . UTI (lower urinary tract infection)   . Depression   . PCOS (polycystic ovarian syndrome)   . Obesity   . Atrial fibrillation (HCC)   . Dysrhythmia   . Anxiety   . GERD (gastroesophageal reflux disease)    Past Surgical History  Procedure Laterality Date  . Wisdom tooth extraction    . Dilation and curettage of uterus     Family History  Problem Relation Age of Onset  . Heart failure Father   . Heart attack Father    Social History  Substance Use Topics  . Smoking status: Current Every Day Smoker -- 0.00 packs/day for 21 years    Types: Cigarettes, E-cigarettes  . Smokeless tobacco: None     Comment: vapor  . Alcohol Use: Yes   OB History    No data available     Review of Systems  Respiratory: Positive for shortness of breath.    Cardiovascular: Positive for palpitations.  Musculoskeletal: Positive for myalgias and arthralgias (left shoulder/scapular pain).  Neurological: Negative for dizziness, syncope and weakness.  All other systems reviewed and are negative.     Allergies  Sulfa antibiotics  Home Medications   Prior to Admission medications   Medication Sig Start Date End Date Taking? Authorizing Provider  ARIPiprazole (ABILIFY) 10 MG tablet Take 10 mg by mouth daily.   Yes Historical Provider, MD  atorvastatin (LIPITOR) 20 MG tablet Take 1 tablet (20 mg total) by mouth daily. 07/13/14  Yes Thermon Leyland, NP  buPROPion (WELLBUTRIN XL) 150 MG 24 hr tablet Take 1 tablet (150 mg total) by mouth daily. Patient taking differently: Take 150 mg by mouth 2 (two) times daily.  07/13/14  Yes Thermon Leyland, NP  CINNAMON PO Take 1,000 mg by mouth daily.   Yes Historical Provider, MD  escitalopram (LEXAPRO) 20 MG tablet Take 20 mg by mouth daily.   Yes Historical Provider, MD  glimepiride (AMARYL) 2 MG tablet Take 2 mg by mouth daily with breakfast.   Yes Historical Provider, MD  ibuprofen (ADVIL,MOTRIN) 200 MG tablet Take 200-800 mg by mouth daily as needed for mild pain or moderate pain.    Yes Historical Provider, MD  lisinopril (PRINIVIL,ZESTRIL) 10 MG tablet Take 1 tablet (10 mg total) by mouth daily. 07/13/14  Yes Thermon Leyland, NP  meloxicam (MOBIC) 15 MG tablet Take 15 mg by mouth daily.   Yes Historical Provider, MD  metFORMIN (GLUCOPHAGE) 1000 MG tablet Take 1 tablet (1,000 mg total) by mouth 2 (two) times daily with a meal. 07/13/14  Yes Thermon LeylandLaura A Davis, NP  Omega-3 Fatty Acids (FISH OIL) 1000 MG CAPS Take 1,000 mg by mouth daily.   Yes Historical Provider, MD  VITAMIN B COMPLEX-C PO Take 1 tablet by mouth daily.   Yes Historical Provider, MD  Vitamins A & D (VITAMIN A & D) ointment Apply 1 application topically as needed (tattoo pain).   Yes Historical Provider, MD  diltiazem (CARDIZEM) 60 MG tablet Take 1  tablet if needed for palpitations. Can be taken every 6 hours. 06/05/15   Gilda Creasehristopher J Abrish Erny, MD  hydrOXYzine (ATARAX/VISTARIL) 25 MG tablet TAKE 1-2 TABLETS BY MOUTH AS NEEDED FOR ANXIETY. 04/18/15   Historical Provider, MD   BP 135/90 mmHg  Pulse 98  Temp(Src) 98.7 F (37.1 C) (Oral)  Resp 16  SpO2 98%  LMP 05/03/2015 (Exact Date) Physical Exam  Constitutional: She is oriented to person, place, and time. She appears well-developed and well-nourished. No distress.  HENT:  Head: Normocephalic and atraumatic.  Right Ear: Hearing normal.  Left Ear: Hearing normal.  Nose: Nose normal.  Mouth/Throat: Oropharynx is clear and moist and mucous membranes are normal.  Eyes: Conjunctivae and EOM are normal. Pupils are equal, round, and reactive to light.  Neck: Normal range of motion. Neck supple.  Cardiovascular: Normal rate, regular rhythm, S1 normal, S2 normal and normal heart sounds.  Exam reveals no gallop and no friction rub.   No murmur heard. Pulmonary/Chest: Effort normal and breath sounds normal. No respiratory distress. She exhibits no tenderness.  Abdominal: Soft. Normal appearance and bowel sounds are normal. There is no hepatosplenomegaly. There is no tenderness. There is no rebound, no guarding, no tenderness at McBurney's point and negative Murphy's sign. No hernia.  Musculoskeletal: Normal range of motion.  Neurological: She is alert and oriented to person, place, and time. She has normal strength. No cranial nerve deficit or sensory deficit. Coordination normal. GCS eye subscore is 4. GCS verbal subscore is 5. GCS motor subscore is 6.  Skin: Skin is warm, dry and intact. No rash noted. No cyanosis.  Psychiatric: She has a normal mood and affect. Her speech is normal and behavior is normal. Thought content normal.  Nursing note and vitals reviewed.   ED Course  Procedures DIAGNOSTIC STUDIES: Oxygen Saturation is 96% on RA, normal by my interpretation.    COORDINATION OF  CARE: 2:04 AM  Discussed treatment plan with pt. Pt agreed to plan.   Labs Review Labs Reviewed  BASIC METABOLIC PANEL - Abnormal; Notable for the following:    Sodium 134 (*)    Chloride 97 (*)    Glucose, Bld 327 (*)    All other components within normal limits  CBC - Abnormal; Notable for the following:    WBC 15.3 (*)    Hemoglobin 15.4 (*)    All other components within normal limits  TSH    Imaging Review Dg Chest 2 View  06/05/2015  CLINICAL DATA:  Palpitations. Mid chest pain. Atrial fibrillation. Symptom onset tonight. EXAM: CHEST  2 VIEW COMPARISON:  04/25/2015 FINDINGS: The cardiomediastinal contours are normal. The lungs are clear. Pulmonary vasculature is normal. No consolidation, pleural effusion, or pneumothorax. No acute osseous abnormalities are seen. IMPRESSION: No acute pulmonary process. Electronically Signed   By: Shawna OrleansMelanie  Ehinger M.D.   On: 06/05/2015 02:35   I have personally reviewed and evaluated these images and lab results as part of my medical decision-making.   EKG Interpretation   Date/Time:  Thursday June 05 2015 01:46:38 EST Ventricular Rate:  165 PR Interval:    QRS Duration: 88 QT Interval:  286 QTC Calculation: 473 R Axis:   62 Text Interpretation:  Atrial fibrillation with rapid ventricular response  Marked ST abnormality, possible inferior subendocardial injury Abnormal  ECG Confirmed by Mauri Tolen  MD, Sunday Klos ([id number]) on 06/05/2015 2:03:13 AM      MDM   Final diagnoses:  Atrial fibrillation with RVR (HCC)    Presents to the emergency department for evaluation of palpitations. Patient reports onset of palpitations around 1:30 AM. She has a history of paroxysmal atrial fibrillation. Patient is asymptomatic at this time. She was tachycardic in triage and EKG showed atrial fibrillation with rapid ventricular response of 165 bpm. By the time she reached the ER room, however, she has spontaneously converted to normal sinus rhythm. She  remains in sinus rhythm.  Case discussed with Dr. Onalee Hua, on-call for cardiology. He is familiar with her case, having seen her previous presentation. She does have CHADSVASc score of 3. We discussed pros and cons of anticoagulation. It was felt that as the patient is very reliable and can tell when she is in atrial fibrillation, that anticoagulation can be withheld at this time. She is to follow-up with cardiology in the office to further explore this. I will counsel her to take a full-strength aspirin daily. She will be given a prescription for diltiazem 60 mg tablets to use when necessary tachycardia. If symptoms resolve quickly, does not need to follow-up, if she does not resolve with a dose of Cardizem, return to the ER for repeat evaluation.  I personally performed the services described in this documentation, which was scribed in my presence. The recorded information has been reviewed and is accurate.    [name], MD 06/05/15 [phone]

## 2015-06-05 NOTE — ED Notes (Signed)
Patient left at this time with all belongings. 

## 2015-06-05 NOTE — ED Notes (Signed)
Pt. reports palpitations onset this evening denies chest pain , mild SOB , no nausea or diaphoresis .

## 2015-06-05 NOTE — ED Notes (Signed)
Pt in sinus tach at this time

## 2015-07-31 ENCOUNTER — Emergency Department (HOSPITAL_COMMUNITY): Payer: 59

## 2015-07-31 ENCOUNTER — Emergency Department (HOSPITAL_COMMUNITY)
Admission: EM | Admit: 2015-07-31 | Discharge: 2015-07-31 | Disposition: A | Discharge status: Home / Self Care | Payer: 59 | Attending: Emergency Medicine | Admitting: Emergency Medicine

## 2015-07-31 ENCOUNTER — Emergency Department (INDEPENDENT_AMBULATORY_CARE_PROVIDER_SITE_OTHER)
Admission: EM | Admit: 2015-07-31 | Discharge: 2015-07-31 | Disposition: A | Discharge status: Nursing Home (Includes ICF) | Payer: 59 | Source: Home / Self Care | Attending: Family Medicine | Admitting: Family Medicine

## 2015-07-31 ENCOUNTER — Encounter (HOSPITAL_COMMUNITY): Payer: Self-pay

## 2015-07-31 ENCOUNTER — Encounter (HOSPITAL_COMMUNITY): Payer: Self-pay | Admitting: Emergency Medicine

## 2015-07-31 DIAGNOSIS — M546 Pain in thoracic spine: Secondary | ICD-10-CM | POA: Diagnosis not present

## 2015-07-31 DIAGNOSIS — Z8739 Personal history of other diseases of the musculoskeletal system and connective tissue: Secondary | ICD-10-CM | POA: Diagnosis not present

## 2015-07-31 DIAGNOSIS — Z8719 Personal history of other diseases of the digestive system: Secondary | ICD-10-CM | POA: Insufficient documentation

## 2015-07-31 DIAGNOSIS — Z3202 Encounter for pregnancy test, result negative: Secondary | ICD-10-CM | POA: Diagnosis not present

## 2015-07-31 DIAGNOSIS — I10 Essential (primary) hypertension: Secondary | ICD-10-CM | POA: Diagnosis not present

## 2015-07-31 DIAGNOSIS — E119 Type 2 diabetes mellitus without complications: Secondary | ICD-10-CM | POA: Diagnosis not present

## 2015-07-31 DIAGNOSIS — R05 Cough: Secondary | ICD-10-CM

## 2015-07-31 DIAGNOSIS — F17210 Nicotine dependence, cigarettes, uncomplicated: Secondary | ICD-10-CM | POA: Insufficient documentation

## 2015-07-31 DIAGNOSIS — J069 Acute upper respiratory infection, unspecified: Secondary | ICD-10-CM

## 2015-07-31 DIAGNOSIS — B349 Viral infection, unspecified: Secondary | ICD-10-CM | POA: Insufficient documentation

## 2015-07-31 DIAGNOSIS — R0789 Other chest pain: Secondary | ICD-10-CM | POA: Insufficient documentation

## 2015-07-31 DIAGNOSIS — R079 Chest pain, unspecified: Secondary | ICD-10-CM | POA: Diagnosis not present

## 2015-07-31 DIAGNOSIS — Z7982 Long term (current) use of aspirin: Secondary | ICD-10-CM | POA: Insufficient documentation

## 2015-07-31 DIAGNOSIS — R059 Cough, unspecified: Secondary | ICD-10-CM

## 2015-07-31 DIAGNOSIS — Z720 Tobacco use: Secondary | ICD-10-CM

## 2015-07-31 DIAGNOSIS — Z87440 Personal history of urinary (tract) infections: Secondary | ICD-10-CM | POA: Diagnosis not present

## 2015-07-31 LAB — CBC
HCT: 44.3 % (ref 36.0–46.0)
Hemoglobin: 15.6 g/dL — ABNORMAL HIGH (ref 12.0–15.0)
MCH: 31.8 pg (ref 26.0–34.0)
MCHC: 35.2 g/dL (ref 30.0–36.0)
MCV: 90.4 fL (ref 78.0–100.0)
Platelets: 294 10*3/uL (ref 150–400)
RBC: 4.9 MIL/uL (ref 3.87–5.11)
RDW: 12.9 % (ref 11.5–15.5)
WBC: 11.7 10*3/uL — ABNORMAL HIGH (ref 4.0–10.5)

## 2015-07-31 LAB — I-STAT TROPONIN, ED: Troponin i, poc: 0 ng/mL (ref 0.00–0.08)

## 2015-07-31 LAB — BASIC METABOLIC PANEL
Anion gap: 12 (ref 5–15)
BUN: 9 mg/dL (ref 6–20)
CO2: 26 mmol/L (ref 22–32)
Calcium: 9.6 mg/dL (ref 8.9–10.3)
Chloride: 100 mmol/L — ABNORMAL LOW (ref 101–111)
Creatinine, Ser: 0.7 mg/dL (ref 0.44–1.00)
GFR calc Af Amer: >60 mL/min (ref >60)
GFR calc non Af Amer: >60 mL/min (ref >60)
Glucose, Bld: 214 mg/dL — ABNORMAL HIGH (ref 65–99)
Potassium: 3.4 mmol/L — ABNORMAL LOW (ref 3.5–5.1)
Sodium: 138 mmol/L (ref 135–145)

## 2015-07-31 LAB — POC URINE PREG, ED: Preg Test, Ur: NEGATIVE

## 2015-07-31 MED ORDER — ASPIRIN 81 MG PO CHEW
324.0000 mg | CHEWABLE_TABLET | Freq: Once | ORAL | Status: AC
  Administered 2015-07-31: 324 mg via ORAL

## 2015-07-31 MED ORDER — ASPIRIN 81 MG PO CHEW
CHEWABLE_TABLET | ORAL | Status: AC
Start: 2015-07-31 — End: 2015-07-31
  Filled 2015-07-31: qty 4

## 2015-07-31 MED ORDER — NITROGLYCERIN 0.4 MG SL SUBL
SUBLINGUAL_TABLET | SUBLINGUAL | Status: AC
  Filled 2015-07-31: qty 1

## 2015-07-31 MED ORDER — LISINOPRIL 10 MG PO TABS
10.0000 mg | ORAL_TABLET | Freq: Once | ORAL | Status: AC
  Administered 2015-07-31: 10 mg via ORAL
  Filled 2015-07-31: qty 1

## 2015-07-31 MED ORDER — NITROGLYCERIN 0.4 MG SL SUBL: 0.4000 mg | SUBLINGUAL_TABLET | SUBLINGUAL | Status: DC | PRN

## 2015-07-31 MED ORDER — SODIUM CHLORIDE 0.9 % IV SOLN
Freq: Once | INTRAVENOUS | Status: AC
  Administered 2015-07-31: 15:00:00 via INTRAVENOUS

## 2015-07-31 NOTE — ED Notes (Signed)
Pt. Coming from urgent care as a transfer c/o chest pressure. Pt. Hx of HTN/DM/Smoking. Pt. Has not been taking medications since end of October. Pt. Hypertensive at this time 144/108. Pt. Given 324 ASA with [facility], but refused nitroglycerin. Pt. Currently 1/2 day smoker. Pt. Denied cough at urgent care, but urgent care reports pt. Was coughing during exam.

## 2015-07-31 NOTE — ED Notes (Signed)
[facility] Terry at same advised unit will be dispatched from inside [facility] in 5-10 minutes.

## 2015-07-31 NOTE — ED Notes (Signed)
Patient waiting for transport-[facility]-

## 2015-07-31 NOTE — ED Provider Notes (Signed)
CSN: [id number]     Arrival date & time 07/31/15  1302 History   First MD Initiated Contact with Patient 07/31/15 1322     Chief Complaint  Patient presents with  . URI   (Consider location/radiation/quality/duration/timing/severity/associated sxs/prior Treatment) HPI Comments: 40 year old morbidly obese female is complaining of sensations of hot and alternating with cold. She is had the onset of her recent minor cough. Also complains of sinus pressure. Denies earache or sore throat. She is complaining of pressure in her chief anterior chest which started today. Denies shortness of breath or cold sweats. She has a history of hypertension and apparently is unable to get her medications filled. She also has a history of atrial fibrillation, type 2 diabetes mellitus, PCO S and GERD.   Past Medical History  Diagnosis Date  . Hypertension   . Diabetes mellitus without complication (HCC)   . UTI (lower urinary tract infection)   . Depression   . PCOS (polycystic ovarian syndrome)   . Obesity   . Atrial fibrillation (HCC)   . Dysrhythmia   . Anxiety   . GERD (gastroesophageal reflux disease)    Past Surgical History  Procedure Laterality Date  . Wisdom tooth extraction    . Dilation and curettage of uterus     Family History  Problem Relation Age of Onset  . Heart failure Father   . Heart attack Father    Social History  Substance Use Topics  . Smoking status: Current Every Day Smoker -- 0.00 packs/day for 21 years    Types: Cigarettes, E-cigarettes  . Smokeless tobacco: None     Comment: vapor  . Alcohol Use: Yes   OB History    No data available     Review of Systems  Constitutional: Positive for activity change. Negative for fever and fatigue.  HENT: Positive for sinus pressure. Negative for ear discharge, ear pain, nosebleeds, postnasal drip and sore throat.   Eyes: Negative for pain and redness.  Respiratory: Negative for shortness of breath.   Cardiovascular: Positive  for chest pain. Negative for palpitations.  Gastrointestinal: Negative for nausea, vomiting and abdominal pain.  Genitourinary: Negative.   Musculoskeletal: Negative.   Skin: Negative.   Neurological: Negative.   Psychiatric/Behavioral: Negative.   All other systems reviewed and are negative.   Allergies  Sulfa antibiotics  Home Medications   Prior to Admission medications   Medication Sig Start Date End Date Taking? Authorizing Provider  OVER THE COUNTER MEDICATION NOT TAKING REGULAR MEDICINES IN 3-4 MONTHS   Yes Historical Provider, MD  Phenyleph-CPM-DM-Aspirin (ALKA-SELTZER PLUS COLD & COUGH PO) Take by mouth.   Yes Historical Provider, MD  Pseudoephedrine-Guaifenesin [facility] D PO) Take by mouth.   Yes Historical Provider, MD  ARIPiprazole (ABILIFY) 10 MG tablet Take 10 mg by mouth daily.    Historical Provider, MD  atorvastatin (LIPITOR) 20 MG tablet Take 1 tablet (20 mg total) by mouth daily. 07/13/14   Thermon Leyland, NP  buPROPion (WELLBUTRIN XL) 150 MG 24 hr tablet Take 1 tablet (150 mg total) by mouth daily. Patient taking differently: Take 150 mg by mouth 2 (two) times daily.  07/13/14   Thermon Leyland, NP  CINNAMON PO Take 1,000 mg by mouth daily.    Historical Provider, MD  diltiazem (CARDIZEM) 60 MG tablet Take 1 tablet if needed for palpitations. Can be taken every 6 hours. 06/05/15   [name], MD  escitalopram (LEXAPRO) 20 MG tablet Take 20 mg by mouth daily.  Historical Provider, MD  glimepiride (AMARYL) 2 MG tablet Take 2 mg by mouth daily with breakfast.    Historical Provider, MD  hydrOXYzine (ATARAX/VISTARIL) 25 MG tablet TAKE 1-2 TABLETS BY MOUTH AS NEEDED FOR ANXIETY. 04/18/15   Historical Provider, MD  ibuprofen (ADVIL,MOTRIN) 200 MG tablet Take 200-800 mg by mouth daily as needed for mild pain or moderate pain.     Historical Provider, MD  lisinopril (PRINIVIL,ZESTRIL) 10 MG tablet Take 1 tablet (10 mg total) by mouth daily. 07/13/14   Thermon Leyland,  NP  meloxicam (MOBIC) 15 MG tablet Take 15 mg by mouth daily.    Historical Provider, MD  metFORMIN (GLUCOPHAGE) 1000 MG tablet Take 1 tablet (1,000 mg total) by mouth 2 (two) times daily with a meal. 07/13/14   Thermon Leyland, NP  Omega-3 Fatty Acids (FISH OIL) 1000 MG CAPS Take 1,000 mg by mouth daily.    Historical Provider, MD  VITAMIN B COMPLEX-C PO Take 1 tablet by mouth daily.    Historical Provider, MD  Vitamins A & D (VITAMIN A & D) ointment Apply 1 application topically as needed (tattoo pain).    Historical Provider, MD   Meds Ordered and Administered this Visit   Medications  0.9 %  sodium chloride infusion (not administered)  aspirin chewable tablet 324 mg (not administered)  nitroGLYCERIN (NITROSTAT) SL tablet 0.4 mg (not administered)    BP 190/117 mmHg  Pulse 108  Temp(Src) 98.2 F (36.8 C) (Oral)  Resp 16  SpO2 97%  LMP 07/26/2015 No data found.   Physical Exam  Constitutional: She is oriented to person, place, and time. She appears well-developed and well-nourished. No distress.  HENT:  Bilateral TMs are retracted. No effusion. Oropharynx with erythematous streaks. Minor cobblestoning. Scant clear PND. No exudates.  Eyes: Conjunctivae and EOM are normal.  Neck: Normal range of motion. Neck supple.  Cardiovascular: Normal rate, regular rhythm and normal heart sounds.   Pulmonary/Chest: Effort normal and breath sounds normal. No respiratory distress. She has no wheezes. She has no rales.  Musculoskeletal: Normal range of motion.  Lymphadenopathy:    She has no cervical adenopathy.  Neurological: She is alert and oriented to person, place, and time. She exhibits normal muscle tone.  Skin: Skin is warm and dry. No rash noted.  Psychiatric: She has a normal mood and affect.  Nursing note and vitals reviewed.   ED Course  Procedures (including critical care time)  Labs Review Labs Reviewed - No data to display  Imaging Review No results found. ED ECG  REPORT   Date: 07/31/2015  Rate: 99  Rhythm: normal sinus rhythm  QRS Axis: normal  Intervals: normal  ST/T Wave abnormalities: normal  Conduction Disutrbances:none  Narrative Interpretation:   Old EKG Reviewed: unchanged 06/05/2015  I have personally reviewed the EKG tracing and agree with the computerized printout as noted.   Visual Acuity Review  Right Eye Distance:   Left Eye Distance:   Bilateral Distance:    Right Eye Near:   Left Eye Near:    Bilateral Near:         MDM   1. URI (upper respiratory infection)   2. Chest pain, unspecified chest pain type   3. Midline thoracic back pain   4. Essential hypertension   5. Tobacco abuse disorder    This 40 year old morbidly obese female with a history of tobacco abuse and uncontrolled hypertension is experiencing heaviness in the anterior mid chest radiating to the upper  mid back. She is also presenting with minor URI symptoms. Due to the chest pain and her risk factors and concerned for other etiologies. She will transferred via [facility] with an IV, O2 and given nitroglycerin and aspirin 324 mg by mouth. She is currently stable. She is also hypertensive. The chest heaviness has improved over time but continues to have an uncomfortable pressure feeling in her upper mid back. In addition she has a history of atrial fibrillation for which she has been seen in the emergency department. This apparently has occurred twice.    Hayden Rasmussenavid Laniece Hornbaker, NP 07/31/15 1511

## 2015-07-31 NOTE — ED Provider Notes (Signed)
CSN: [id number]     Arrival date & time 07/31/15  1618 History   First MD Initiated Contact with Patient 07/31/15 1620     Chief Complaint  Patient presents with  . Chest Pain     (Consider location/radiation/quality/duration/timing/severity/associated sxs/prior Treatment) HPI Comments: 40yo F w/ PMH including morbid obesity, A fib, HTN, HLD, T2DM who p/w chest pain. Pt has had 2 days of hot/cold sensation and feeling feverish at home. She has taken OTC meds feeling like she was coming down with a cold. This morning, she began having substernal 2/10 in intensity chest pressure radiating to back and shoulders, feels like a heaviness. Chest pressure is constant, worse with deep inspiration. She feels like she is congested and needs to cough stuff up but cannot. Feels different from A fib sensation. Her CP is currently resolved. No SOB, N/V, diaphoresis, cough, abdominal pain, or recent illness. No urinary or bowel movement changes. Husband currently ill with cough/congestion. Pt seen at urgent care today and given 324mg  ASA, she refused NTG. She denies any other complaints.  Of note, the patient has been off of her chronic medications after losing insurance but states that she has now regained insurance and will be able to refill her prescriptions.  Patient is a 40 y.o. female presenting with chest pain. The history is provided by the patient.  Chest Pain   Past Medical History  Diagnosis Date  . Hypertension   . Diabetes mellitus without complication (HCC)   . UTI (lower urinary tract infection)   . Depression   . PCOS (polycystic ovarian syndrome)   . Obesity   . Atrial fibrillation (HCC)   . Dysrhythmia   . Anxiety   . GERD (gastroesophageal reflux disease)    Past Surgical History  Procedure Laterality Date  . Wisdom tooth extraction    . Dilation and curettage of uterus     Family History  Problem Relation Age of Onset  . Heart failure Father   . Heart attack Father     Social History  Substance Use Topics  . Smoking status: Current Every Day Smoker -- 0.50 packs/day for 21 years    Types: Cigarettes, E-cigarettes  . Smokeless tobacco: None     Comment: vapor  . Alcohol Use: Yes     Comment: rare   OB History    Gravida Para Term Preterm AB TAB SAB Ectopic Multiple Living   2    2          Review of Systems  Cardiovascular: Positive for chest pain.   10 Systems reviewed and are negative for acute change except as noted in the HPI.    Allergies  Sulfa antibiotics  Home Medications   Prior to Admission medications   Medication Sig Start Date End Date Taking? Authorizing Provider  aspirin-sod bicarb-citric acid (ALKA-SELTZER) 325 MG TBEF tablet Take 325 mg by mouth every 6 (six) hours as needed (cold symptoms).   Yes Historical Provider, MD  guaiFENesin (MUCINEX) 600 MG 12 hr tablet Take 600 mg by mouth 2 (two) times daily as needed for cough.   Yes Historical Provider, MD  ibuprofen (ADVIL,MOTRIN) 200 MG tablet Take 200-800 mg by mouth daily as needed for mild pain or moderate pain.    Yes Historical Provider, MD  atorvastatin (LIPITOR) 20 MG tablet Take 1 tablet (20 mg total) by mouth daily. Patient not taking: Reported on 07/31/2015 07/13/14   Thermon Leyland, NP  buPROPion (WELLBUTRIN XL) 150 MG 24  hr tablet Take 1 tablet (150 mg total) by mouth daily. Patient not taking: Reported on 07/31/2015 07/13/14   Thermon LeylandLaura A Davis, NP  diltiazem (CARDIZEM) 60 MG tablet Take 1 tablet if needed for palpitations. Can be taken every 6 hours. Patient not taking: Reported on 07/31/2015 06/05/15   Gilda Creasehristopher J Pollina, MD  lisinopril (PRINIVIL,ZESTRIL) 10 MG tablet Take 1 tablet (10 mg total) by mouth daily. Patient not taking: Reported on 07/31/2015 07/13/14   Thermon LeylandLaura A Davis, NP  metFORMIN (GLUCOPHAGE) 1000 MG tablet Take 1 tablet (1,000 mg total) by mouth 2 (two) times daily with a meal. Patient not taking: Reported on 07/31/2015 07/13/14   Thermon LeylandLaura A Davis, NP   BP  170/118 mmHg  Pulse 90  Temp(Src) 98 F (36.7 C) (Oral)  Resp 20  SpO2 97%  LMP 07/26/2015 Physical Exam  Constitutional: She is oriented to person, place, and time. She appears well-developed and well-nourished. No distress.  HENT:  Head: Normocephalic and atraumatic.  Right Ear: External ear normal.  Left Ear: External ear normal.  Mouth/Throat: Oropharynx is clear and moist.  Moist mucous membranes, erythema of posterior oropharynx, R external ear canal cobblestoned and erythematous  Eyes: Conjunctivae are normal. Pupils are equal, round, and reactive to light.  Neck: Neck supple.  Cardiovascular: Normal rate, regular rhythm and normal heart sounds.   No murmur heard. Pulmonary/Chest: Effort normal.  Occasional exp wheeze, occasional dry cough  Abdominal: Soft. Bowel sounds are normal. She exhibits no distension. There is no tenderness.  Musculoskeletal: She exhibits no edema.  Lymphadenopathy:    She has no cervical adenopathy.  Neurological: She is alert and oriented to person, place, and time.  Fluent speech  Skin: Skin is warm and dry.  Psychiatric: She has a normal mood and affect. Judgment normal.  Nursing note and vitals reviewed.   ED Course  Procedures (including critical care time) Labs Review Labs Reviewed  BASIC METABOLIC PANEL - Abnormal; Notable for the following:    Potassium 3.4 (*)    Chloride 100 (*)    Glucose, Bld 214 (*)    All other components within normal limits  CBC - Abnormal; Notable for the following:    WBC 11.7 (*)    Hemoglobin 15.6 (*)    All other components within normal limits  I-STAT TROPOININ, ED  POC URINE PREG, ED    Imaging Review Dg Chest 2 View  07/31/2015  CLINICAL DATA:  Mid sternal chest pain radiating into the back for 2 days. EXAM: CHEST  2 VIEW COMPARISON:  06/05/2015 FINDINGS: Vague density projects over the right anterior first rib end and is thought to be bony based on its association with the rib and on multiple  sets of radiographs. The lungs appear clear. Cardiac and mediastinal margins appear normal. Thoracic spondylosis noted. IMPRESSION: No active cardiopulmonary disease. Electronically Signed   By: Gaylyn RongWalter  Liebkemann M.D.   On: 07/31/2015 17:09   I have personally reviewed and evaluated these lab results as part of my medical decision-making.   EKG Interpretation   Date/Time:  Thursday July 31 2015 17:45:48 EST Ventricular Rate:  87 PR Interval:  135 QRS Duration: 88 QT Interval:  381 QTC Calculation: 458 R Axis:   74 Text Interpretation:  Sinus rhythm Probable anteroseptal infarct, old  Baseline wander in lead(s) V5 V6 no significant change from EKG in 2016  Confirmed by Phenix Grein MD, Elif Yonts [phone] on 07/31/2015 5:48:30 PM      MDM   Final diagnoses:  Viral syndrome  Cough  Chest pressure   Patient sent from urgent care for evaluation of chest pressure after she presented there with 2 days of malaise and feeling like she was getting a cold. Patient well-appearing on arrival. Vital signs notable for hypertension at 144/108. O2 sat normal and normal work of breathing on room air. No crackles or abnormal lung sounds on exam. Occasional dry cough. EKG shows sinus rhythm, normal rate, normal axis, no ST segment or T-wave changes to suggest acute ischemia. Chest x-ray unremarkable. Obtained basic lab work including troponin. Gave the patient her home dose of lisinopril.  Given the prodrome of malaise and sick contact, I suspect that her chest heaviness may be related to a viral process. Her description is atypical for cardiac chest pain and she denies any symptoms of shortness of breath. The patient's heart score is 2 therefore I feel she is safe for discharge. Discussed supportive care instructions and emphasized the importance of medication compliance and close follow-up with PCP as patient does have risk factors for heart disease and may need further workup if her symptoms return. I have also  counseled her on smoking cessation. Patient voiced understanding of plan as well as return precautions. Patient discharged in satisfactory condition.   Laurence Spates, MD 08/01/15 [phone]

## 2015-07-31 NOTE — ED Notes (Signed)
Complains of feeling hot and then cold spells, not chills per patient. Patient reports feeling congestion in upper chest, but unable to cough congestion up .  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  Patient reports with deep breaths feels fullness in upper chest.  Denies "pain". Denies ear pain, throat pain, or runny nose.   Patient not taking her medicines, did not have insurance.  Off medicines for several months (october) and reports an ed visit in November secondary to an episode of afib.

## 2015-11-20 ENCOUNTER — Encounter (HOSPITAL_COMMUNITY): Payer: Self-pay | Admitting: Emergency Medicine

## 2015-11-20 ENCOUNTER — Emergency Department (HOSPITAL_COMMUNITY)
Admission: EM | Admit: 2015-11-20 | Discharge: 2015-11-21 | Disposition: A | Discharge status: Home / Self Care | Payer: 59 | Attending: Emergency Medicine | Admitting: Emergency Medicine

## 2015-11-20 DIAGNOSIS — F17210 Nicotine dependence, cigarettes, uncomplicated: Secondary | ICD-10-CM | POA: Diagnosis not present

## 2015-11-20 DIAGNOSIS — E119 Type 2 diabetes mellitus without complications: Secondary | ICD-10-CM | POA: Insufficient documentation

## 2015-11-20 DIAGNOSIS — E669 Obesity, unspecified: Secondary | ICD-10-CM | POA: Diagnosis not present

## 2015-11-20 DIAGNOSIS — Z8659 Personal history of other mental and behavioral disorders: Secondary | ICD-10-CM | POA: Diagnosis not present

## 2015-11-20 DIAGNOSIS — R109 Unspecified abdominal pain: Secondary | ICD-10-CM | POA: Insufficient documentation

## 2015-11-20 DIAGNOSIS — I10 Essential (primary) hypertension: Secondary | ICD-10-CM | POA: Insufficient documentation

## 2015-11-20 DIAGNOSIS — Z3202 Encounter for pregnancy test, result negative: Secondary | ICD-10-CM | POA: Insufficient documentation

## 2015-11-20 DIAGNOSIS — Z87440 Personal history of urinary (tract) infections: Secondary | ICD-10-CM | POA: Insufficient documentation

## 2015-11-20 DIAGNOSIS — Z8719 Personal history of other diseases of the digestive system: Secondary | ICD-10-CM | POA: Diagnosis not present

## 2015-11-20 DIAGNOSIS — R197 Diarrhea, unspecified: Secondary | ICD-10-CM | POA: Diagnosis not present

## 2015-11-20 LAB — COMPREHENSIVE METABOLIC PANEL
ALT: 40 U/L (ref 14–54)
AST: 42 U/L — ABNORMAL HIGH (ref 15–41)
Albumin: 3.7 g/dL (ref 3.5–5.0)
Alkaline Phosphatase: 62 U/L (ref 38–126)
Anion gap: 13 (ref 5–15)
BUN: 8 mg/dL (ref 6–20)
CO2: 20 mmol/L — ABNORMAL LOW (ref 22–32)
Calcium: 9.1 mg/dL (ref 8.9–10.3)
Chloride: 100 mmol/L — ABNORMAL LOW (ref 101–111)
Creatinine, Ser: 0.76 mg/dL (ref 0.44–1.00)
GFR calc Af Amer: >60 mL/min (ref >60)
GFR calc non Af Amer: >60 mL/min (ref >60)
Glucose, Bld: 225 mg/dL — ABNORMAL HIGH (ref 65–99)
Potassium: 4.3 mmol/L (ref 3.5–5.1)
Sodium: 133 mmol/L — ABNORMAL LOW (ref 135–145)
Total Bilirubin: 0.8 mg/dL (ref 0.3–1.2)
Total Protein: 6.7 g/dL (ref 6.5–8.1)

## 2015-11-20 LAB — CBC
HCT: 41.9 % (ref 36.0–46.0)
Hemoglobin: 14.3 g/dL (ref 12.0–15.0)
MCH: 31.2 pg (ref 26.0–34.0)
MCHC: 34.1 g/dL (ref 30.0–36.0)
MCV: 91.3 fL (ref 78.0–100.0)
Platelets: 311 10*3/uL (ref 150–400)
RBC: 4.59 MIL/uL (ref 3.87–5.11)
RDW: 13.3 % (ref 11.5–15.5)
WBC: 14.3 10*3/uL — ABNORMAL HIGH (ref 4.0–10.5)

## 2015-11-20 LAB — URINALYSIS, ROUTINE W REFLEX MICROSCOPIC
Bilirubin Urine: NEGATIVE
Glucose, UA: 250 mg/dL — AB
Ketones, ur: NEGATIVE mg/dL
Leukocytes, UA: NEGATIVE
Nitrite: NEGATIVE
Protein, ur: NEGATIVE mg/dL
Specific Gravity, Urine: 1.011 (ref 1.005–1.030)
pH: 5 (ref 5.0–8.0)

## 2015-11-20 LAB — URINE MICROSCOPIC-ADD ON: WBC, UA: NONE SEEN WBC/hpf (ref 0–5)

## 2015-11-20 LAB — POC URINE PREG, ED: Preg Test, Ur: NEGATIVE

## 2015-11-20 LAB — LIPASE, BLOOD: Lipase: 32 U/L (ref 11–51)

## 2015-11-20 MED ORDER — ONDANSETRON HCL 4 MG/2ML IJ SOLN
4.0000 mg | INTRAMUSCULAR | Status: AC
  Administered 2015-11-20: 4 mg via INTRAVENOUS
  Filled 2015-11-20: qty 2

## 2015-11-20 MED ORDER — MORPHINE SULFATE (PF) 4 MG/ML IV SOLN
4.0000 mg | Freq: Once | INTRAVENOUS | Status: AC
  Administered 2015-11-20: 4 mg via INTRAVENOUS
  Filled 2015-11-20: qty 1

## 2015-11-20 NOTE — ED Notes (Signed)
Left sided abdominal pain that radiates to left flank.  Stabbing pain.  No pain or burning with urination.  Pain is not moving.  No v/v or fever wit this.  No increase pain with palpation of area

## 2015-11-20 NOTE — ED Provider Notes (Signed)
CSN: [id number]     Arrival date & time 11/20/15  1831 History   First MD Initiated Contact with Patient 11/20/15 2154     Chief Complaint  Patient presents with  . Abdominal Pain   Julia Dickerson is a 40 y.o. female who presents to the emergency department complaining of left-sided abdominal pain since yesterday. She reports gradual onset yesterday that has worsened today. She currently complains of 7 out of 10 pain to her left side of her abdomen. She reports she had one episode of diarrhea today. No blood in her stool. She's had no nausea or vomiting. No vaginal bleeding or vaginal discharge. No urinary symptoms. No history of diverticulitis or diverticulosis. No history of kidney stones. No previous abdominal surgeries. She reports taking Tylenol earlier today without relief of her pain. Her last menstrual cycle was 3 weeks ago. Patient denies fevers, coughing, trouble breathing, vomiting, nausea, hematemesis, hematochezia, urinary symptoms, hematuria, vaginal bleeding, vaginal discharge, or rashes.   Patient is a 40 y.o. female presenting with abdominal pain. The history is provided by the patient. No language interpreter was used.  Abdominal Pain Associated symptoms: diarrhea   Associated symptoms: no chest pain, no chills, no cough, no dysuria, no fever, no hematuria, no nausea, no shortness of breath, no sore throat, no vaginal bleeding, no vaginal discharge and no vomiting     Past Medical History  Diagnosis Date  . Hypertension   . Diabetes mellitus without complication (HCC)   . UTI (lower urinary tract infection)   . Depression   . PCOS (polycystic ovarian syndrome)   . Obesity   . Atrial fibrillation (HCC)   . Dysrhythmia   . Anxiety   . GERD (gastroesophageal reflux disease)    Past Surgical History  Procedure Laterality Date  . Wisdom tooth extraction    . Dilation and curettage of uterus     Family History  Problem Relation Age of Onset  . Heart failure Father   .  Heart attack Father    Social History  Substance Use Topics  . Smoking status: Current Every Day Smoker -- 0.50 packs/day for 21 years    Types: Cigarettes, E-cigarettes  . Smokeless tobacco: None     Comment: vapor  . Alcohol Use: Yes     Comment: rare   OB History    Gravida Para Term Preterm AB TAB SAB Ectopic Multiple Living   2    2          Review of Systems  Constitutional: Negative for fever and chills.  HENT: Negative for congestion and sore throat.   Eyes: Negative for visual disturbance.  Respiratory: Negative for cough and shortness of breath.   Cardiovascular: Negative for chest pain.  Gastrointestinal: Positive for abdominal pain and diarrhea. Negative for nausea, vomiting and blood in stool.  Genitourinary: Negative for dysuria, urgency, frequency, hematuria, flank pain, vaginal bleeding, vaginal discharge and difficulty urinating.  Musculoskeletal: Negative for back pain and neck pain.  Skin: Negative for rash.  Neurological: Negative for dizziness, light-headedness and headaches.      Allergies  Sulfa antibiotics  Home Medications   Prior to Admission medications   Medication Sig Start Date End Date Taking? Authorizing Provider  aspirin-sod bicarb-citric acid (ALKA-SELTZER) 325 MG TBEF tablet Take 325 mg by mouth every 6 (six) hours as needed (cold symptoms).    Historical Provider, MD  atorvastatin (LIPITOR) 20 MG tablet Take 1 tablet (20 mg total) by mouth daily. Patient not taking:  Reported on 07/31/2015 07/13/14   Thermon Leyland, NP  buPROPion (WELLBUTRIN XL) 150 MG 24 hr tablet Take 1 tablet (150 mg total) by mouth daily. Patient not taking: Reported on 07/31/2015 07/13/14   Thermon Leyland, NP  diltiazem (CARDIZEM) 60 MG tablet Take 1 tablet if needed for palpitations. Can be taken every 6 hours. Patient not taking: Reported on 07/31/2015 06/05/15   [name], MD  guaiFENesin (MUCINEX) 600 MG 12 hr tablet Take 600 mg by mouth 2 (two) times daily  as needed for cough.    Historical Provider, MD  ibuprofen (ADVIL,MOTRIN) 200 MG tablet Take 200-800 mg by mouth daily as needed for mild pain or moderate pain.     Historical Provider, MD  lisinopril (PRINIVIL,ZESTRIL) 10 MG tablet Take 1 tablet (10 mg total) by mouth daily. Patient not taking: Reported on 07/31/2015 07/13/14   Thermon Leyland, NP  metFORMIN (GLUCOPHAGE) 1000 MG tablet Take 1 tablet (1,000 mg total) by mouth 2 (two) times daily with a meal. Patient not taking: Reported on 07/31/2015 07/13/14   Thermon Leyland, NP   BP 154/90 mmHg  Pulse 92  Temp(Src) 98.3 F (36.8 C) (Oral)  Resp 18  Ht  (1.727 m)  Wt 120.203 kg  BMI 40.30 kg/m2  SpO2 97%  LMP 11/05/2015 (Exact Date) Physical Exam  Constitutional: She appears well-developed and well-nourished. No distress.  Nontoxic appearing.  HENT:  Head: Normocephalic and atraumatic.  Mouth/Throat: Oropharynx is clear and moist.  Eyes: Conjunctivae are normal. Pupils are equal, round, and reactive to light. Right eye exhibits no discharge. Left eye exhibits no discharge.  Neck: Neck supple.  Cardiovascular: Normal rate, regular rhythm, normal heart sounds and intact distal pulses.  Exam reveals no gallop and no friction rub.   No murmur heard. Pulmonary/Chest: Effort normal and breath sounds normal. No respiratory distress. She has no wheezes. She has no rales.  Abdominal: Soft. Bowel sounds are normal. She exhibits no distension and no mass. There is tenderness. There is no rebound and no guarding.  Abdomen is soft. Bowel sounds are present. Patient has left midabdominal tenderness to palpation. No lower abdominal tenderness to palpation. No right quadrant tenderness to palpation. No CVA or flank tenderness. No psoas or obturator sign.  Musculoskeletal: She exhibits no edema.  Lymphadenopathy:    She has no cervical adenopathy.  Neurological: She is alert. Coordination normal.  Skin: Skin is warm and dry. No rash noted. She is not  diaphoretic. No erythema. No pallor.  Psychiatric: She has a normal mood and affect. Her behavior is normal.  Nursing note and vitals reviewed.   ED Course  Procedures (including critical care time) Labs Review Labs Reviewed  COMPREHENSIVE METABOLIC PANEL - Abnormal; Notable for the following:    Sodium 133 (*)    Chloride 100 (*)    CO2 20 (*)    Glucose, Bld 225 (*)    AST 42 (*)    All other components within normal limits  CBC - Abnormal; Notable for the following:    WBC 14.3 (*)    All other components within normal limits  URINALYSIS, ROUTINE W REFLEX MICROSCOPIC (NOT AT [facility]) - Abnormal; Notable for the following:    Glucose, UA 250 (*)    Hgb urine dipstick TRACE (*)    All other components within normal limits  URINE MICROSCOPIC-ADD ON - Abnormal; Notable for the following:    Squamous Epithelial / LPF 0-5 (*)  Bacteria, UA RARE (*)    All other components within normal limits  LIPASE, BLOOD  POC URINE PREG, ED    Imaging Review No results found. I have personally reviewed and evaluated theselab results as part of my medical decision-making.   EKG Interpretation None      Filed Vitals:   11/21/15 0000 11/21/15 0015 11/21/15 0115 11/21/15 0130  BP: 142/77 148/85 159/94 154/90  Pulse: 90 92 88 92  Temp:      TempSrc:      Resp:      Height:      Weight:      SpO2: 97% 95% 98% 97%     MDM   Meds given in ED:  Medications  morphine 4 MG/ML injection 4 mg (4 mg Intravenous Given 11/20/15 2321)  ondansetron (ZOFRAN) injection 4 mg (4 mg Intravenous Given 11/20/15 2321)    New Prescriptions   No medications on file    Final diagnoses:  Left sided abdominal pain   This is a 40 y.o. female who presents to the emergency department complaining of left-sided abdominal pain since yesterday. She reports gradual onset yesterday that has worsened today. She currently complains of 7 out of 10 pain to her left side of her abdomen. She reports she had one  episode of diarrhea today. No blood in her stool. She's had no nausea or vomiting. No vaginal bleeding or vaginal discharge. No urinary symptoms. No history of diverticulitis or diverticulosis. No history of kidney stones. No previous abdominal surgeries. She is tolerating by mouth.   On exam the patient is afebrile nontoxic appearing. She has mid left-sided abdominal tenderness to palpation. No lower abdominal tenderness to palpation. No peritoneal signs. Gradual onset does not sound like ovarian torsion. With some leukocytosis and diarrhea I'm concerned for possible diverticulitis or other inflammatory bowel disease. Will obtain CT abdomen and pelvis with contrast.  At shift change patient is awaiting CT abdomen and pelvis. Patient care handed off to April Palumbo, MD at shift change who will disposition the patient.    Everlene FarrierWilliam Romanda Turrubiates, PA-C 11/21/15 0140  Loren Raceravid Yelverton, MD 11/24/15 [phone]

## 2015-11-20 NOTE — ED Notes (Addendum)
Documentation performed on wrong pt.

## 2015-11-20 NOTE — ED Notes (Signed)
Pt notes that this feels a bit like an ovarian cyst she had in the past

## 2015-11-21 ENCOUNTER — Emergency Department (HOSPITAL_COMMUNITY): Payer: 59

## 2015-11-21 MED ORDER — IOPAMIDOL (ISOVUE-300) INJECTION 61%
INTRAVENOUS | Status: AC
  Administered 2015-11-21: 100 mL
  Filled 2015-11-21: qty 100

## 2015-11-21 MED ORDER — HYOSCYAMINE SULFATE 0.125 MG SL SUBL: 0.1250 mg | SUBLINGUAL_TABLET | SUBLINGUAL | Status: DC | PRN

## 2015-11-21 MED ORDER — NAPROXEN 375 MG PO TABS: 375.0000 mg | ORAL_TABLET | Freq: Two times a day (BID) | ORAL | Status: DC

## 2015-11-21 MED ORDER — KETOROLAC TROMETHAMINE 30 MG/ML IJ SOLN
30.0000 mg | Freq: Once | INTRAMUSCULAR | Status: AC
  Administered 2015-11-21: 30 mg via INTRAVENOUS
  Filled 2015-11-21: qty 1

## 2015-11-21 NOTE — ED Notes (Signed)
Pt verbalized understanding of discharge instructions and follow up care

## 2016-01-14 ENCOUNTER — Ambulatory Visit (HOSPITAL_COMMUNITY)
Admission: EM | Admit: 2016-01-14 | Discharge: 2016-01-14 | Disposition: A | Discharge status: Home / Self Care | Payer: 59 | Attending: Emergency Medicine | Admitting: Emergency Medicine

## 2016-01-14 ENCOUNTER — Encounter (HOSPITAL_COMMUNITY): Payer: Self-pay | Admitting: *Deleted

## 2016-01-14 DIAGNOSIS — M5432 Sciatica, left side: Secondary | ICD-10-CM | POA: Diagnosis not present

## 2016-01-14 MED ORDER — HYDROCODONE-ACETAMINOPHEN 5-325 MG PO TABS: 2.0000 | ORAL_TABLET | ORAL | Status: DC | PRN

## 2016-01-14 NOTE — ED Provider Notes (Signed)
CSN: [id number]     Arrival date & time 01/14/16  1259 History   First MD Initiated Contact with Patient 01/14/16 1322     Chief Complaint  Patient presents with  . Leg Pain   (Consider location/radiation/quality/duration/timing/severity/associated sxs/prior Treatment) HPI History obtained from patient: Location: Left buttock  Context/Duration: Several days, sudden onset all sleeping , awoke with pain  Severity: 4   Quality: Aching, shooting Timing:      Episodic worse when sitting      Home Treatment: Ibuprofen and heat Associated symptoms:  Occasional foot pain Family History: Heart disease- father    Past Medical History  Diagnosis Date  . Hypertension   . Diabetes mellitus without complication (HCC)   . UTI (lower urinary tract infection)   . Depression   . PCOS (polycystic ovarian syndrome)   . Obesity   . Atrial fibrillation (HCC)   . Dysrhythmia   . Anxiety   . GERD (gastroesophageal reflux disease)    Past Surgical History  Procedure Laterality Date  . Wisdom tooth extraction    . Dilation and curettage of uterus     Family History  Problem Relation Age of Onset  . Heart failure Father   . Heart attack Father    Social History  Substance Use Topics  . Smoking status: Current Every Day Smoker -- 0.50 packs/day for 21 years    Types: Cigarettes, E-cigarettes  . Smokeless tobacco: None     Comment: vapor  . Alcohol Use: Yes     Comment: rare   OB History    Gravida Para Term Preterm AB TAB SAB Ectopic Multiple Living   2    2          Review of Systems  Denies: HEADACHE, NAUSEA, ABDOMINAL PAIN, CHEST PAIN, CONGESTION, DYSURIA, SHORTNESS OF BREATH  Allergies  Sulfa antibiotics  Home Medications   Prior to Admission medications   Medication Sig Start Date End Date Taking? Authorizing Provider  aspirin-sod bicarb-citric acid (ALKA-SELTZER) 325 MG TBEF tablet Take 325 mg by mouth every 6 (six) hours as needed (cold symptoms).    Historical Provider,  MD  atorvastatin (LIPITOR) 20 MG tablet Take 1 tablet (20 mg total) by mouth daily. Patient not taking: Reported on 07/31/2015 07/13/14   Thermon LeylandLaura A Davis, NP  buPROPion (WELLBUTRIN XL) 150 MG 24 hr tablet Take 1 tablet (150 mg total) by mouth daily. Patient not taking: Reported on 07/31/2015 07/13/14   Thermon LeylandLaura A Davis, NP  diltiazem (CARDIZEM) 60 MG tablet Take 1 tablet if needed for palpitations. Can be taken every 6 hours. Patient not taking: Reported on 07/31/2015 06/05/15   Gilda Creasehristopher J Pollina, MD  guaiFENesin (MUCINEX) 600 MG 12 hr tablet Take 600 mg by mouth 2 (two) times daily as needed for cough.    Historical Provider, MD  HYDROcodone-acetaminophen (NORCO/VICODIN) 5-325 MG tablet Take 2 tablets by mouth every 4 (four) hours as needed. 01/14/16   Tharon AquasFrank C Haivyn Oravec, PA  hyoscyamine (LEVSIN/SL) 0.125 MG SL tablet Place 1 tablet (0.125 mg total) under the tongue every 4 (four) hours as needed. 11/21/15   April Palumbo, MD  ibuprofen (ADVIL,MOTRIN) 200 MG tablet Take 200-800 mg by mouth daily as needed for mild pain or moderate pain.     Historical Provider, MD  lisinopril (PRINIVIL,ZESTRIL) 10 MG tablet Take 1 tablet (10 mg total) by mouth daily. Patient not taking: Reported on 07/31/2015 07/13/14   Thermon LeylandLaura A Davis, NP  metFORMIN (GLUCOPHAGE) 1000 MG tablet Take  1 tablet (1,000 mg total) by mouth 2 (two) times daily with a meal. Patient not taking: Reported on 07/31/2015 07/13/14   Thermon Leyland, NP   Meds Ordered and Administered this Visit  Medications - No data to display  BP 168/100 mmHg  Pulse 78  Temp(Src) 98.6 F (37 C) (Oral)  Resp 18  SpO2 100%  LMP 01/12/2016 No data found.   Physical Exam NURSES NOTES AND VITAL SIGNS REVIEWED. CONSTITUTIONAL: Well developed, well nourished, no acute distress HEENT: normocephalic, atraumatic EYES: Conjunctiva normal NECK:normal ROM, supple, no adenopathy PULMONARY:No respiratory distress, normal effort ABDOMINAL: Soft, ND, NT BS+, No  CVAT MUSCULOSKELETAL: Normal ROM of all extremities, Left Buttock, tenderness at the sciatic notch. Sensory intact distally, no troch tenderness.  SKIN: warm and dry without rash PSYCHIATRIC: Mood and affect, behavior are normal  ED Course  Procedures (including critical care time)  Labs Review Labs Reviewed - No data to display  Imaging Review No results found.   Visual Acuity Review  Right Eye Distance:   Left Eye Distance:   Bilateral Distance:    Right Eye Near:   Left Eye Near:    Bilateral Near:       EXPECT A FULL RECOVERY FROM THIS SELF LIMITING DIAGNOSIS NEEDS TO HAVE BP FOLLOWED.  RX. LORTAB#20 NO WORK NOTE REQUIRED.   MDM   1. Sciatica neuralgia, left     Patient is reassured that there are no issues that require transfer to higher level of care at this time or additional tests. Patient is advised to continue home symptomatic treatment. Patient is advised that if there are new or worsening symptoms to attend the emergency department, contact primary care provider, or return to UC. Instructions of care provided discharged home in stable condition.    THIS NOTE WAS GENERATED USING A VOICE RECOGNITION SOFTWARE PROGRAM. ALL REASONABLE EFFORTS  WERE MADE TO PROOFREAD THIS DOCUMENT FOR ACCURACY.  I have verbally reviewed the discharge instructions with the patient. A printed AVS was given to the patient.  All questions were answered prior to discharge.      Tharon Aquas, PA 01/14/16 1400

## 2016-01-14 NOTE — ED Notes (Signed)
PT  HAS  PAIN  L  BUTTOCK  RAADIATING DOWN  HER L  LEG     WITH  SYMPTOMS  X  3  DAYS         PT  DENYS  ANY  KNOWN  INJURY  SITTING UPRIGHT ON THE  EXAM TABLE     HAS  HAD     SCIATICA    IN  PAST

## 2016-01-14 NOTE — Discharge Instructions (Signed)
°  Sciatica °Sciatica is pain, weakness, numbness, or tingling along your sciatic nerve. The nerve starts in the lower back and runs down the back of each leg. Nerve damage or certain conditions pinch or put pressure on the sciatic nerve. This causes the pain, weakness, and other discomforts of sciatica. °HOME CARE  °· Only take medicine as told by your doctor. °· Apply ice to the affected area for 20 minutes. Do this 3-4 times a day for the first 48-72 hours. Then try heat in the same way. °· Exercise, stretch, or do your usual activities if these do not make your pain worse. °· Go to physical therapy as told by your doctor. °· Keep all doctor visits as told. °· Do not wear high heels or shoes that are not supportive. °· Get a firm mattress if your mattress is too soft to lessen pain and discomfort. °GET HELP RIGHT AWAY IF:  °· You cannot control when you poop (bowel movement) or pee (urinate). °· You have more weakness in your lower back, lower belly (pelvis), butt (buttocks), or legs. °· You have redness or puffiness (swelling) of your back. °· You have a burning feeling when you pee. °· You have pain that gets worse when you lie down. °· You have pain that wakes you from your sleep. °· Your pain is worse than past pain. °· Your pain lasts longer than 4 weeks. °· You are suddenly losing weight without reason. °MAKE SURE YOU:  °· Understand these instructions. °· Will watch this condition. °· Will get help right away if you are not doing well or get worse. °  °This information is not intended to replace advice given to you by your health care provider. Make sure you discuss any questions you have with your health care provider. °  °Document Released: 04/20/2008 Document Revised: 04/02/2015 Document Reviewed: 11/21/2011 °Elsevier Interactive Patient Education ©2016 [employer] ° ° °

## 2016-02-13 ENCOUNTER — Encounter (HOSPITAL_COMMUNITY): Payer: Self-pay | Admitting: Emergency Medicine

## 2016-02-13 ENCOUNTER — Emergency Department (HOSPITAL_COMMUNITY)
Admission: EM | Admit: 2016-02-13 | Discharge: 2016-02-13 | Disposition: A | Discharge status: Home / Self Care | Payer: 59 | Attending: Emergency Medicine | Admitting: Emergency Medicine

## 2016-02-13 DIAGNOSIS — M549 Dorsalgia, unspecified: Secondary | ICD-10-CM | POA: Diagnosis present

## 2016-02-13 DIAGNOSIS — E0821 Diabetes mellitus due to underlying condition with diabetic nephropathy: Secondary | ICD-10-CM

## 2016-02-13 DIAGNOSIS — F17210 Nicotine dependence, cigarettes, uncomplicated: Secondary | ICD-10-CM | POA: Diagnosis not present

## 2016-02-13 DIAGNOSIS — E0840 Diabetes mellitus due to underlying condition with diabetic neuropathy, unspecified: Secondary | ICD-10-CM | POA: Diagnosis not present

## 2016-02-13 DIAGNOSIS — J020 Streptococcal pharyngitis: Secondary | ICD-10-CM | POA: Diagnosis not present

## 2016-02-13 DIAGNOSIS — Z7982 Long term (current) use of aspirin: Secondary | ICD-10-CM | POA: Insufficient documentation

## 2016-02-13 DIAGNOSIS — M5432 Sciatica, left side: Secondary | ICD-10-CM

## 2016-02-13 DIAGNOSIS — Z7984 Long term (current) use of oral hypoglycemic drugs: Secondary | ICD-10-CM | POA: Diagnosis not present

## 2016-02-13 DIAGNOSIS — I10 Essential (primary) hypertension: Secondary | ICD-10-CM | POA: Insufficient documentation

## 2016-02-13 LAB — CBG MONITORING, ED: Glucose-Capillary: 311 mg/dL — ABNORMAL HIGH (ref 65–99)

## 2016-02-13 LAB — RAPID STREP SCREEN (MED CTR MEBANE ONLY): Streptococcus, Group A Screen (Direct): POSITIVE — AB

## 2016-02-13 MED ORDER — PENICILLIN G BENZATHINE 1200000 UNIT/2ML IM SUSP
2.4000 10*6.[IU] | Freq: Once | INTRAMUSCULAR | Status: AC
  Administered 2016-02-13: 2.4 10*6.[IU] via INTRAMUSCULAR
  Filled 2016-02-13: qty 4

## 2016-02-13 MED ORDER — IBUPROFEN 800 MG PO TABS: 800.0000 mg | ORAL_TABLET | Freq: Four times a day (QID) | ORAL | Status: DC | PRN

## 2016-02-13 MED ORDER — CYCLOBENZAPRINE HCL 10 MG PO TABS: 10.0000 mg | ORAL_TABLET | Freq: Two times a day (BID) | ORAL | Status: DC | PRN

## 2016-02-13 MED ORDER — HYDROCODONE-ACETAMINOPHEN 5-325 MG PO TABS: 2.0000 | ORAL_TABLET | ORAL | Status: DC | PRN

## 2016-02-13 MED ORDER — LISINOPRIL 20 MG PO TABS
20.0000 mg | ORAL_TABLET | Freq: Once | ORAL | Status: AC
  Administered 2016-02-13: 20 mg via ORAL
  Filled 2016-02-13: qty 1

## 2016-02-13 MED ORDER — CYCLOBENZAPRINE HCL 10 MG PO TABS
5.0000 mg | ORAL_TABLET | Freq: Once | ORAL | Status: AC
Start: 2016-02-13 — End: 2016-02-13
  Administered 2016-02-13: 5 mg via ORAL
  Filled 2016-02-13: qty 1

## 2016-02-13 MED ORDER — IBUPROFEN 800 MG PO TABS
800.0000 mg | ORAL_TABLET | Freq: Once | ORAL | Status: AC
  Administered 2016-02-13: 800 mg via ORAL
  Filled 2016-02-13: qty 1

## 2016-02-13 MED ORDER — LISINOPRIL 10 MG PO TABS: 10.0000 mg | ORAL_TABLET | Freq: Every day | ORAL | Status: DC

## 2016-02-13 NOTE — ED Provider Notes (Signed)
CSN: [id number]     Arrival date & time 02/13/16  4098 History   First MD Initiated Contact with Patient 02/13/16 [phone]     Chief Complaint  Patient presents with  . Back Pain  . Leg Pain  . Sore Throat     (Consider location/radiation/quality/duration/timing/severity/associated sxs/prior Treatment) The history is provided by the patient.  Julia Dickerson is a 40 y.o. female history of hypertension, diabetes, atrial fibrillation here presenting with back pain radiated to the left leg. Patient states that she has been having back pain radiating to the left leg for the last month or so. She was seen at urgent care about month ago and was diagnosed with sciatica. She was given some Norco with minimal relief. She was taking some Motrin with little relief as well. Denies any fevers or chills but noticed sore throat since yesterday. She denies any weakness or incontinence. He says she was not referred to any orthopedic surgeon or spine doctor.     Past Medical History  Diagnosis Date  . Hypertension   . Diabetes mellitus without complication (HCC)   . UTI (lower urinary tract infection)   . Depression   . PCOS (polycystic ovarian syndrome)   . Obesity   . Atrial fibrillation (HCC)   . Dysrhythmia   . Anxiety   . GERD (gastroesophageal reflux disease)    Past Surgical History  Procedure Laterality Date  . Wisdom tooth extraction    . Dilation and curettage of uterus     Family History  Problem Relation Age of Onset  . Heart failure Father   . Heart attack Father    Social History  Substance Use Topics  . Smoking status: Current Every Day Smoker -- 0.50 packs/day for 21 years    Types: Cigarettes, E-cigarettes  . Smokeless tobacco: None     Comment: vapor  . Alcohol Use: Yes     Comment: rare   OB History    Gravida Para Term Preterm AB TAB SAB Ectopic Multiple Living   2    2          Review of Systems  HENT: Positive for sore throat.   Musculoskeletal: Positive for back  pain.  All other systems reviewed and are negative.     Allergies  Sulfa antibiotics  Home Medications   Prior to Admission medications   Medication Sig Start Date End Date Taking? Authorizing Provider  aspirin-sod bicarb-citric acid (ALKA-SELTZER) 325 MG TBEF tablet Take 325 mg by mouth every 6 (six) hours as needed (cold symptoms).    Historical Provider, MD  atorvastatin (LIPITOR) 20 MG tablet Take 1 tablet (20 mg total) by mouth daily. Patient not taking: Reported on 07/31/2015 07/13/14   Thermon Leyland, NP  buPROPion (WELLBUTRIN XL) 150 MG 24 hr tablet Take 1 tablet (150 mg total) by mouth daily. Patient not taking: Reported on 07/31/2015 07/13/14   Thermon Leyland, NP  diltiazem (CARDIZEM) 60 MG tablet Take 1 tablet if needed for palpitations. Can be taken every 6 hours. Patient not taking: Reported on 07/31/2015 06/05/15   [name], MD  guaiFENesin (MUCINEX) 600 MG 12 hr tablet Take 600 mg by mouth 2 (two) times daily as needed for cough.    Historical Provider, MD  HYDROcodone-acetaminophen (NORCO/VICODIN) 5-325 MG tablet Take 2 tablets by mouth every 4 (four) hours as needed. 01/14/16   Tharon Aquas, PA  hyoscyamine (LEVSIN/SL) 0.125 MG SL tablet Place 1 tablet (0.125 mg total) under  the tongue every 4 (four) hours as needed. 11/21/15   April Palumbo, MD  ibuprofen (ADVIL,MOTRIN) 200 MG tablet Take 200-800 mg by mouth daily as needed for mild pain or moderate pain.     Historical Provider, MD  lisinopril (PRINIVIL,ZESTRIL) 10 MG tablet Take 1 tablet (10 mg total) by mouth daily. Patient not taking: Reported on 07/31/2015 07/13/14   Thermon LeylandLaura A Davis, NP  metFORMIN (GLUCOPHAGE) 1000 MG tablet Take 1 tablet (1,000 mg total) by mouth 2 (two) times daily with a meal. Patient not taking: Reported on 07/31/2015 07/13/14   Thermon LeylandLaura A Davis, NP   BP 163/105 mmHg  Pulse 92  Temp(Src) 98.3 F (36.8 C) (Oral)  Resp 16  Ht 5\' 8"  (1.727 m)  Wt 260 lb (117.935 kg)  BMI 39.54 kg/m2  SpO2 96%   LMP 01/12/2016 Physical Exam  Constitutional: She is oriented to person, place, and time.  Uncomfortable   HENT:  Head: Normocephalic.  OP slightly red, no obvious tonsillar exudates   Eyes: Conjunctivae are normal. Pupils are equal, round, and reactive to light.  Neck: Normal range of motion. Neck supple.  Cardiovascular: Normal rate, regular rhythm and normal heart sounds.   Pulmonary/Chest: Effort normal and breath sounds normal. No respiratory distress. She has no wheezes. She has no rales.  Abdominal: Soft. Bowel sounds are normal. She exhibits no distension. There is no tenderness. There is no rebound.  Musculoskeletal: Normal range of motion. She exhibits no edema or tenderness.  Neurological: She is alert and oriented to person, place, and time.  + straight leg raise on L side. No midline spinal tenderness. No saddle anesthesia. Nl sensation and strength throughout   Skin: Skin is warm and dry.  Psychiatric: She has a normal mood and affect. Her behavior is normal. Judgment normal.  Nursing note and vitals reviewed.   ED Course  Procedures (including critical care time) Labs Review Labs Reviewed  RAPID STREP SCREEN (NOT [facility]) - Abnormal; Notable for the following:    Streptococcus, Group A Screen (Direct) POSITIVE (*)    All other components within normal limits  CBG MONITORING, ED - Abnormal; Notable for the following:    Glucose-Capillary 311 (*)    All other components within normal limits    Imaging Review No results found. I have personally reviewed and evaluated these images and lab results as part of my medical decision-making.   EKG Interpretation None      MDM   Final diagnoses:  None   Julia Dickerson is a 40 y.o. female here with sore throat, L sciatica. Neurovascular intact. Has uncontrolled diabetes and also ran out of BP meds. Will not need MRI currently as she is neurologically intact. Will check cbg, give pain meds. Will get rapid strep.    11:49 AM BP improved with lisinopril 20 mg. Rapid strep positive, given bicillin shot. CBG 311 close to previous. Told her that she needs to take her meds as prescribed. Will add flexeril, norco. Will have her see spine doctor.       Charlynne Panderavid Hsienta Tennyson Wacha, MD 02/13/16 1149

## 2016-02-13 NOTE — ED Notes (Signed)
States stretched and felt lower back pain buttocks pain radiating to left leg for one month. States also has a sore throat onset 2 days ago. Airway intact bilateral equal chest rise and fall.

## 2016-02-13 NOTE — Discharge Instructions (Signed)
Take lisinopril 20 mg daily.   Continue your medicines for diabetes.   Take motrin 800 mg every 6 hrs for pain.   Take flexeril for muscle spasms.   Take vicodin for severe pain.   Your blood sugar is 300. You need to see your primary care doctor  See neurosurgeon for follow up for your back pain.   Return to ER if you have blood sugar > 500, vomiting, fever, worse sore throat, trouble swallowing, worse back pain, weakness, trouble urinating or walking

## 2016-05-14 IMAGING — DX DG CHEST 2V
2 series · 2 of 2 positions shown · non-contrast
Comparison: 04/25/2015

CLINICAL DATA: Palpitations. Mid chest pain. Atrial fibrillation.
Symptom onset tonight.

EXAM:
CHEST  2 VIEW

[chest pa]
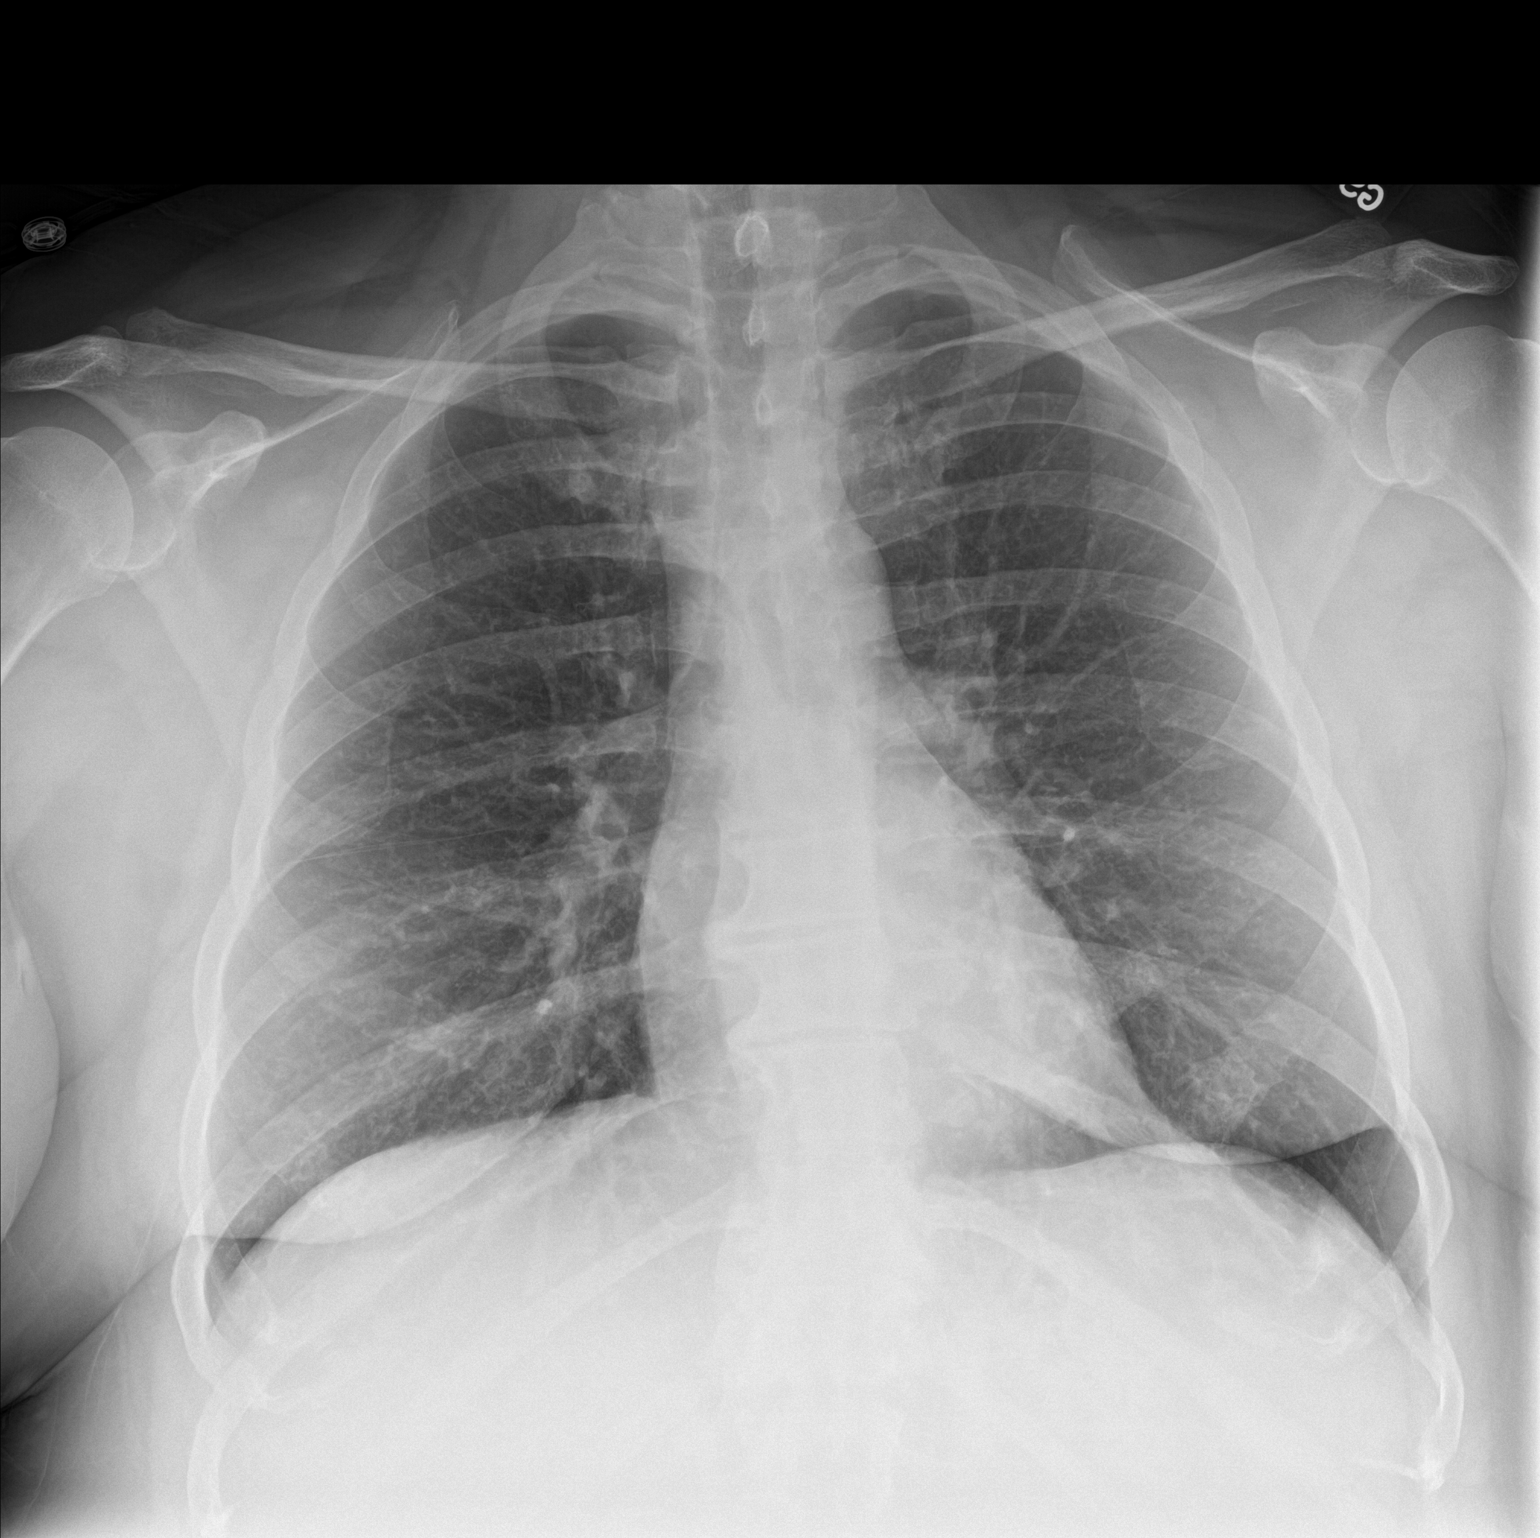

[chest lat]
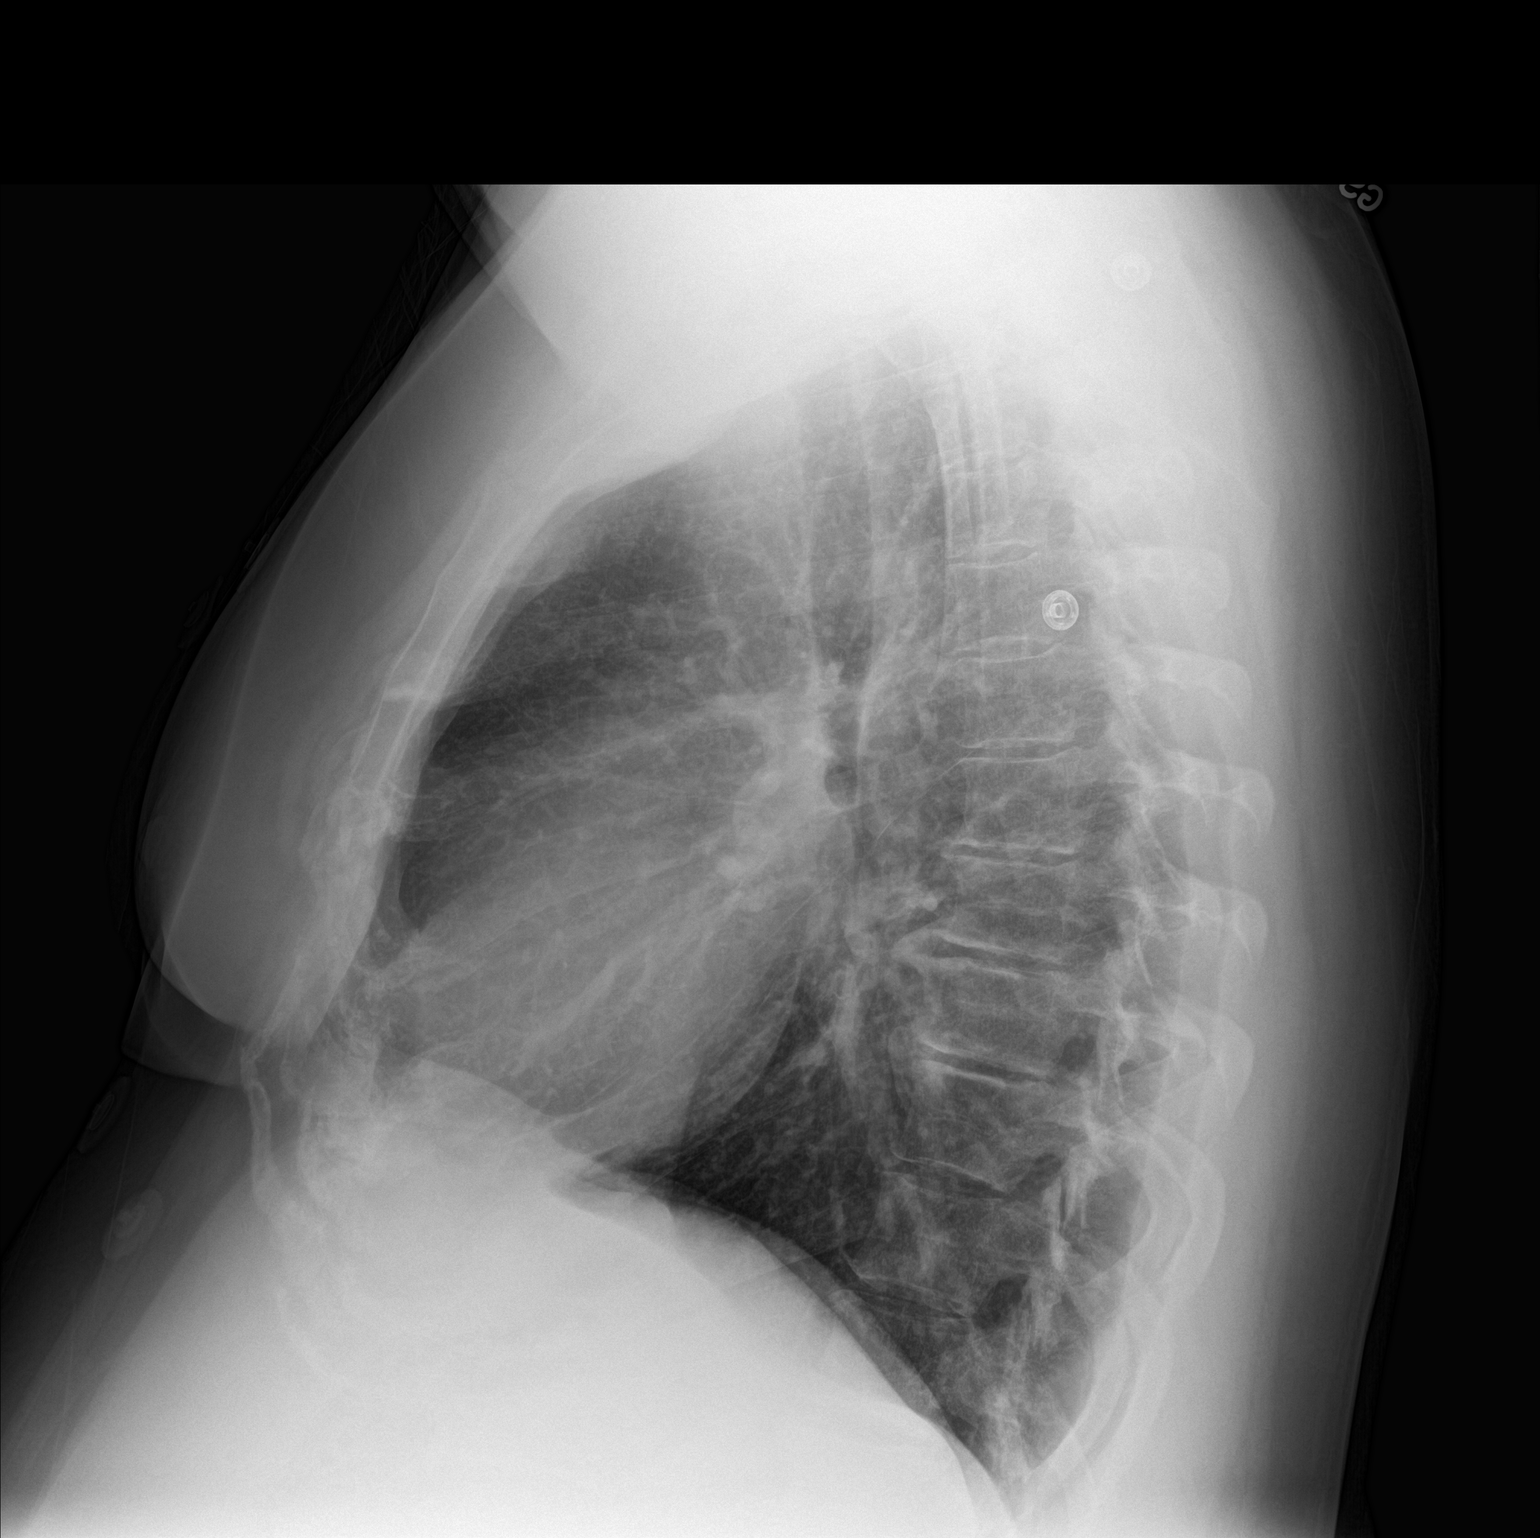

[2 of 2 positions shown; findings below may reference images not displayed]

FINDINGS: The cardiomediastinal contours are normal. The lungs are clear.
Pulmonary vasculature is normal. No consolidation, pleural effusion,
or pneumothorax. No acute osseous abnormalities are seen.
IMPRESSION: No acute pulmonary process.

## 2016-05-18 ENCOUNTER — Emergency Department (HOSPITAL_COMMUNITY)
Admission: EM | Admit: 2016-05-18 | Discharge: 2016-05-18 | Disposition: A | Discharge status: Home / Self Care | Payer: 59 | Attending: Emergency Medicine | Admitting: Emergency Medicine

## 2016-05-18 ENCOUNTER — Encounter (HOSPITAL_COMMUNITY): Payer: Self-pay | Admitting: Emergency Medicine

## 2016-05-18 DIAGNOSIS — Z7984 Long term (current) use of oral hypoglycemic drugs: Secondary | ICD-10-CM | POA: Diagnosis not present

## 2016-05-18 DIAGNOSIS — Z7982 Long term (current) use of aspirin: Secondary | ICD-10-CM | POA: Diagnosis not present

## 2016-05-18 DIAGNOSIS — E119 Type 2 diabetes mellitus without complications: Secondary | ICD-10-CM | POA: Insufficient documentation

## 2016-05-18 DIAGNOSIS — I10 Essential (primary) hypertension: Secondary | ICD-10-CM | POA: Diagnosis not present

## 2016-05-18 DIAGNOSIS — I4891 Unspecified atrial fibrillation: Secondary | ICD-10-CM

## 2016-05-18 DIAGNOSIS — Z79899 Other long term (current) drug therapy: Secondary | ICD-10-CM | POA: Diagnosis not present

## 2016-05-18 DIAGNOSIS — R002 Palpitations: Secondary | ICD-10-CM | POA: Diagnosis present

## 2016-05-18 DIAGNOSIS — F17210 Nicotine dependence, cigarettes, uncomplicated: Secondary | ICD-10-CM | POA: Diagnosis not present

## 2016-05-18 LAB — BASIC METABOLIC PANEL
Anion gap: 16 — ABNORMAL HIGH (ref 5–15)
BUN: 13 mg/dL (ref 6–20)
CO2: 21 mmol/L — ABNORMAL LOW (ref 22–32)
Calcium: 9.7 mg/dL (ref 8.9–10.3)
Chloride: 101 mmol/L (ref 101–111)
Creatinine, Ser: 0.78 mg/dL (ref 0.44–1.00)
GFR calc Af Amer: >60 mL/min (ref >60)
GFR calc non Af Amer: >60 mL/min (ref >60)
Glucose, Bld: 389 mg/dL — ABNORMAL HIGH (ref 65–99)
Potassium: 4.1 mmol/L (ref 3.5–5.1)
Sodium: 138 mmol/L (ref 135–145)

## 2016-05-18 LAB — CBC
HCT: 51.3 % — ABNORMAL HIGH (ref 36.0–46.0)
Hemoglobin: 18.2 g/dL — ABNORMAL HIGH (ref 12.0–15.0)
MCH: 31.5 pg (ref 26.0–34.0)
MCHC: 35.5 g/dL (ref 30.0–36.0)
MCV: 88.9 fL (ref 78.0–100.0)
Platelets: 329 10*3/uL (ref 150–400)
RBC: 5.77 MIL/uL — ABNORMAL HIGH (ref 3.87–5.11)
RDW: 13.3 % (ref 11.5–15.5)
WBC: 13.9 10*3/uL — ABNORMAL HIGH (ref 4.0–10.5)

## 2016-05-18 LAB — TSH: TSH: 1.079 u[IU]/mL (ref 0.350–4.500)

## 2016-05-18 LAB — MAGNESIUM: Magnesium: 1.8 mg/dL (ref 1.7–2.4)

## 2016-05-18 LAB — CBG MONITORING, ED: Glucose-Capillary: 323 mg/dL — ABNORMAL HIGH (ref 65–99)

## 2016-05-18 MED ORDER — DILTIAZEM LOAD VIA INFUSION
10.0000 mg | Freq: Once | INTRAVENOUS | Status: AC
  Administered 2016-05-18: 10 mg via INTRAVENOUS
  Filled 2016-05-18: qty 10

## 2016-05-18 MED ORDER — APIXABAN 5 MG PO TABS
5.0000 mg | ORAL_TABLET | Freq: Two times a day (BID) | ORAL | Status: DC
  Administered 2016-05-18: 5 mg via ORAL
  Filled 2016-05-18: qty 1

## 2016-05-18 MED ORDER — SODIUM CHLORIDE 0.9 % IV BOLUS (SEPSIS): 1000.0000 mL | Freq: Once | INTRAVENOUS | Status: DC

## 2016-05-18 MED ORDER — DILTIAZEM HCL-DEXTROSE 100-5 MG/100ML-% IV SOLN (PREMIX)
5.0000 mg/h | INTRAVENOUS | Status: DC
  Administered 2016-05-18: 5 mg/h via INTRAVENOUS
  Filled 2016-05-18: qty 100

## 2016-05-18 MED ORDER — KETAMINE HCL 10 MG/ML IJ SOLN: 1.0000 mg/kg | Freq: Once | INTRAMUSCULAR | Status: DC

## 2016-05-18 MED ORDER — APIXABAN 5 MG PO TABS: 5.0000 mg | ORAL_TABLET | Freq: Two times a day (BID) | ORAL | 0 refills | Status: DC

## 2016-05-18 MED ORDER — PROCAINAMIDE HCL 100 MG/ML IJ SOLN
1000.0000 mg | INTRAMUSCULAR | Status: AC
  Administered 2016-05-18: 1000 mg via INTRAVENOUS
  Filled 2016-05-18: qty 10

## 2016-05-18 NOTE — ED Notes (Signed)
MD at bedside. 

## 2016-05-18 NOTE — ED Notes (Signed)
Consent for sedation and cardioversion signed and at bedside.

## 2016-05-18 NOTE — ED Provider Notes (Signed)
[facility]-EMERGENCY DEPT Provider Note  CSN: [id number] Arrival date & time: 05/18/16  1025  History   Chief Complaint Chief Complaint  Patient presents with  . Palpitations    HPI Julia Dickerson is a 40 y.o. female.   Palpitations   This is a recurrent problem. The current episode started 3 to 5 hours ago. The problem occurs constantly. The problem has not changed since onset.The problem is associated with exercise, fear, caffeine, cocaine and thyroid pills. Pertinent negatives include no fever, no chest pain, no chest pressure, no orthopnea, no abdominal pain, no nausea, no vomiting and no shortness of breath.    Past Medical History:  Diagnosis Date  . Anxiety   . Atrial fibrillation (HCC)   . Depression   . Diabetes mellitus without complication (HCC)   . Dysrhythmia   . GERD (gastroesophageal reflux disease)   . Hypertension   . Obesity   . PCOS (polycystic ovarian syndrome)   . UTI (lower urinary tract infection)     Patient Active Problem List   Diagnosis Date Noted  . Major depression, recurrent (HCC) 07/10/2014    Past Surgical History:  Procedure Laterality Date  . DILATION AND CURETTAGE OF UTERUS    . WISDOM TOOTH EXTRACTION      OB History    Gravida Para Term Preterm AB Living   2       2     SAB TAB Ectopic Multiple Live Births                   Home Medications    Prior to Admission medications   Medication Sig Start Date End Date Taking? Authorizing Provider  aspirin-sod bicarb-citric acid (ALKA-SELTZER) 325 MG TBEF tablet Take 325 mg by mouth every 6 (six) hours as needed (cold symptoms).    Historical Provider, MD  atorvastatin (LIPITOR) 20 MG tablet Take 1 tablet (20 mg total) by mouth daily. Patient not taking: Reported on 07/31/2015 07/13/14   Thermon Leyland, NP  buPROPion (WELLBUTRIN XL) 150 MG 24 hr tablet Take 1 tablet (150 mg total) by mouth daily. Patient not taking: Reported on 07/31/2015 07/13/14   Thermon Leyland, NP  cyclobenzaprine  (FLEXERIL) 10 MG tablet Take 1 tablet (10 mg total) by mouth 2 (two) times daily as needed for muscle spasms. 02/13/16   Charlynne Pander, MD  diltiazem (CARDIZEM) 60 MG tablet Take 1 tablet if needed for palpitations. Can be taken every 6 hours. Patient not taking: Reported on 07/31/2015 06/05/15   [name], MD  guaiFENesin (MUCINEX) 600 MG 12 hr tablet Take 600 mg by mouth 2 (two) times daily as needed for cough.    Historical Provider, MD  HYDROcodone-acetaminophen (NORCO/VICODIN) 5-325 MG tablet Take 2 tablets by mouth every 4 (four) hours as needed. 02/13/16   Charlynne Pander, MD  hyoscyamine (LEVSIN/SL) 0.125 MG SL tablet Place 1 tablet (0.125 mg total) under the tongue every 4 (four) hours as needed. 11/21/15   April Palumbo, MD  ibuprofen (ADVIL,MOTRIN) 800 MG tablet Take 1 tablet (800 mg total) by mouth every 6 (six) hours as needed for mild pain or moderate pain. 02/13/16   Charlynne Pander, MD  lisinopril (PRINIVIL,ZESTRIL) 10 MG tablet Take 1 tablet (10 mg total) by mouth daily. 02/13/16   Charlynne Pander, MD  metFORMIN (GLUCOPHAGE) 1000 MG tablet Take 1 tablet (1,000 mg total) by mouth 2 (two) times daily with a meal. Patient not taking: Reported on 07/31/2015 07/13/14  Thermon Leyland, NP    Family History Family History  Problem Relation Age of Onset  . Heart failure Father   . Heart attack Father     Social History Social History  Substance Use Topics  . Smoking status: Current Every Day Smoker    Packs/day: 0.50    Years: 21.00    Types: Cigarettes, E-cigarettes  . Smokeless tobacco: Not on file     Comment: vapor  . Alcohol use Yes     Comment: rare   Allergies   Sulfa antibiotics  Review of Systems Review of Systems  Constitutional: Negative for chills, fatigue and fever.  Respiratory: Positive for chest tightness. Negative for shortness of breath.   Cardiovascular: Positive for palpitations. Negative for chest pain and orthopnea.  Gastrointestinal:  Negative for abdominal pain, constipation, nausea and vomiting.  Genitourinary: Negative for decreased urine volume, flank pain, hematuria, pelvic pain, vaginal bleeding and vaginal discharge.  Skin: Negative for wound.  Psychiatric/Behavioral: Negative for behavioral problems and decreased concentration.  All other systems reviewed and are negative.  Physical Exam Updated Vital Signs BP (!) 152/110   Pulse (!) 133   Temp 97.6 F (36.4 C) (Oral)   Resp 15   SpO2 98%   Physical Exam  Constitutional: She appears well-developed and well-nourished. No distress.  Eyes: Pupils are equal, round, and reactive to light.  Neck: Normal range of motion.  Cardiovascular:  Tachycardic  Irregularly irregular rhythm  Pulmonary/Chest: Effort normal and breath sounds normal.  Abdominal: Soft. She exhibits no distension. There is no tenderness.  Musculoskeletal: She exhibits no edema.  Neurological: She is alert.  Skin: Skin is warm. Capillary refill takes less than 2 seconds. She is not diaphoretic.  Psychiatric: Her behavior is normal.  Nursing note and vitals reviewed.  ED Treatments / Results  Labs (all labs ordered are listed, but only abnormal results are displayed) Labs Reviewed  CBC - Abnormal; Notable for the following:       Result Value   WBC 13.9 (*)    RBC 5.77 (*)    Hemoglobin 18.2 (*)    HCT 51.3 (*)    All other components within normal limits  BASIC METABOLIC PANEL - Abnormal; Notable for the following:    CO2 21 (*)    Glucose, Bld 389 (*)    Anion gap 16 (*)    All other components within normal limits  CBG MONITORING, ED - Abnormal; Notable for the following:    Glucose-Capillary 323 (*)    All other components within normal limits  MAGNESIUM  TSH    EKG  EKG Interpretation  Date/Time:  Tuesday May 18 2016 10:29:16 EDT Ventricular Rate:  161 PR Interval:    QRS Duration: 104 QT Interval:  332 QTC Calculation: 543 R Axis:   67 Text Interpretation:   Atrial flutter with variable A-V block Septal infarct , age undetermined ST & T wave abnormality, consider inferolateral ischemia Abnormal ECG Confirmed by RAY MD, Duwayne Heck ([id number]) on 05/18/2016 10:33:05 AM      Radiology No results found.  Procedures Procedures (including critical care time)  Medications Ordered in ED Medications  procainamide (PRONESTYL) 1,000 mg in dextrose 5 % 250 mL IVPB (1,000 mg Intravenous Given 05/18/16 1205)  diltiazem (CARDIZEM) 1 mg/mL load via infusion 10 mg (10 mg Intravenous Bolus from Bag 05/18/16 1118)     Initial Impression / Assessment and Plan / ED Course  I have reviewed the triage vital signs and the  nursing notes.  Pertinent labs & imaging results that were available during my care of the patient were reviewed by me and considered in my medical decision making (see chart for details).  Clinical Course   Patient presents to the ED with a-fib with RVR that started at exactly 5:00 AM this morning. She has a history of the same. Patient took her 60 mg of diltiazem with no changes to her HR.   Denies caffeine intake, cocaine or drug abuse or infectious etiology.   Patient endorses some light headedness and "feeling funny" but is otherwise asymptomatic.   Diltiazem given without significant improvement in HR despite infusion.   Procainamide given with resolution of tachycardia and conversion to NSR.  No cardioversion required.   Patient CHADVASC of 3. Place in a-fib pathway with follow up with Cardiology and placed on Eliquis due to her risk factors.   To note, patient had hyperglycemia and is poorly controlled as patient is not compliant with metformin. She had no DKA based on labs. Patient will take her home medications and follow up with Cardiology and her PCP.   Patient discharged home in good health.   Final Clinical Impressions(s) / ED Diagnoses   Final diagnoses:  Atrial fibrillation with rapid ventricular response [facility](HCC)    New  Prescriptions Discharge Medication List as of 05/18/2016  1:34 PM    START taking these medications   Details  apixaban (ELIQUIS) 5 MG TABS tablet Take 1 tablet (5 mg total) by mouth 2 (two) times daily., Starting Tue 05/18/2016, Print         Deirdre PeerJeremiah Leianna Barga, MD 05/18/16 [id number]    Margarita Grizzleanielle Ray, MD 05/19/16 1116

## 2016-05-18 NOTE — ED Triage Notes (Signed)
Pt here for palpitations with hx of tachycardia; pt noted HR 170 bpm

## 2016-05-19 ENCOUNTER — Telehealth (HOSPITAL_COMMUNITY): Payer: Self-pay | Admitting: *Deleted

## 2016-05-19 NOTE — Telephone Encounter (Signed)
Lft vcml for pt to callback to sched appt since recent visit to ED.

## 2016-06-01 ENCOUNTER — Encounter: Payer: Self-pay | Admitting: Cardiology

## 2016-06-04 ENCOUNTER — Ambulatory Visit: Payer: 59 | Admitting: Cardiology

## 2016-06-04 ENCOUNTER — Encounter: Payer: Self-pay | Admitting: *Deleted

## 2016-06-23 ENCOUNTER — Encounter: Payer: Self-pay | Admitting: Cardiology

## 2016-06-23 ENCOUNTER — Ambulatory Visit (INDEPENDENT_AMBULATORY_CARE_PROVIDER_SITE_OTHER): Payer: 59 | Admitting: Cardiology

## 2016-06-23 VITALS — BP 169/111 | HR 94 | Ht 68.0 in | Wt 258.2 lb

## 2016-06-23 DIAGNOSIS — I10 Essential (primary) hypertension: Secondary | ICD-10-CM

## 2016-06-23 DIAGNOSIS — I480 Paroxysmal atrial fibrillation: Secondary | ICD-10-CM | POA: Diagnosis not present

## 2016-06-23 DIAGNOSIS — R9431 Abnormal electrocardiogram [ECG] [EKG]: Secondary | ICD-10-CM

## 2016-06-23 MED ORDER — APIXABAN 5 MG PO TABS: 5.0000 mg | ORAL_TABLET | Freq: Two times a day (BID) | ORAL | 2 refills | Status: DC

## 2016-06-23 MED ORDER — LISINOPRIL 10 MG PO TABS: 10.0000 mg | ORAL_TABLET | Freq: Every day | ORAL | 3 refills | Status: DC

## 2016-06-23 MED ORDER — DILTIAZEM HCL ER COATED BEADS 120 MG PO CP24: 120.0000 mg | ORAL_CAPSULE | Freq: Every day | ORAL | 3 refills | Status: DC

## 2016-06-23 NOTE — Progress Notes (Signed)
PCP: [name], [name], NP  Clinic Note: Chief Complaint  Patient presents with  . New Patient (Initial Visit)    Pt states other Sx.   . Palpitations    at random.    HPI: Julia Dickerson is a 40 y.o. female with a PMH below who presents today for initial cardiology consultation as part of ER follow-up. She apparently has a diagnosis of paroxysmal A. fib, and presented emergency room on October 24  with palpitations. She is noted to be in atrial flutter with variable block/RVR. She took her when necessary diltiazem and no change. She was given procainamide in the ER after diltiazem. This converted her to sinus rhythm. CHA2DS2Vasc calculated to be 3. She was started on ELIQUIS.  This patients CHA2DS2-VASc Score and unadjusted Ischemic Stroke Rate (% per year) is equal to 3.2 % stroke rate/year from a score of 3;  Above score calculated as 1 point each if present [CHF, HTN, DM, Vascular=MI/PAD/Aortic Plaque, Age if 65-74, or Female];  2 points each if present [Age > 75, or Stroke/TIA/TE]   Julia Dickerson was seen on in the ER in Nov 2016 & most recently in Oct 2017  Recent Hospitalizations: ER visit October 20 --> discharge with when necessary diltiazem 60 mg. This did not seem to break it the last time she had an episode.During this episode he she was cardioverted with procainamide following initiation of diltiazem drip.  Studies Reviewed: None  Interval History: Julia Dickerson presents today for follow-up evaluation of her recurrent atrial fibrillation episode. Her first episode was actually in September 2016. She then again had an episode in November 2016 before the most recent episode in October this year. Each time she was chemically converted her cardioverted to sinus rhythm with discharged from the emergency room. She never did seek follow-up with cardiology.  Other than these episodes of A. fib, she has not had any other cardiac issues. She definitely knows when she is in atrial fibrillation  she feels her heart racing with irregularity she gets short of breath and discomfort in her left shoulder and chest. When she converts to sinus rhythm she notes the symptoms 100% abate.  She really doesn't feel dizzy or lightheaded unless it goes on long. She never had any syncope or near syncope associated with it.  Otherwise her cardiac review of symptoms is as follows: No chest pain or shortness of breath with rest or exertion.  No PND, orthopnea or edema.  No lightheadedness, dizziness, weakness or syncope/near syncope. No TIA/amaurosis fugax symptoms.  ROS: A comprehensive was performed. Review of Systems  Constitutional: Negative for malaise/fatigue.  HENT: Negative for congestion and sinus pain.   Eyes: Negative.   Respiratory: Negative for cough, shortness of breath and wheezing.   Cardiovascular:       Per history of present illness  Gastrointestinal: Negative for blood in stool and melena.  Genitourinary: Negative for hematuria.  Musculoskeletal: Negative for joint pain.  Skin: Negative.   Neurological: Negative for dizziness, focal weakness and weakness.  Psychiatric/Behavioral: Negative for depression and memory loss. The patient is not nervous/anxious and does not have insomnia.     Past Medical History:  Diagnosis Date  . Anxiety   . Atrial fibrillation (HCC)   . Depression   . Diabetes mellitus without complication (HCC)   . Dysrhythmia   . GERD (gastroesophageal reflux disease)   . Hypertension   . Obesity   . PCOS (polycystic ovarian syndrome)   . UTI (lower urinary  tract infection)     Past Surgical History:  Procedure Laterality Date  . DILATION AND CURETTAGE OF UTERUS    . WISDOM TOOTH EXTRACTION     Current Meds  Medication Sig  . apixaban (ELIQUIS) 5 MG TABS tablet Take 1 tablet (5 mg total) by mouth 2 (two) times daily.  [name] aspirin 325 MG tablet Take 325 mg by mouth daily.  [name] atorvastatin (LIPITOR) 20 MG tablet Take 1 tablet (20 mg total) by mouth  daily.  [name] co-enzyme Q-10 30 MG capsule Take 30 mg by mouth daily.  [name] diltiazem (CARDIZEM) 60 MG tablet Take 1 tablet if needed for palpitations. Can be taken every 6 hours.  [name] glimepiride (AMARYL) 2 MG tablet Take 2 mg by mouth daily with breakfast.  . lisinopril (PRINIVIL,ZESTRIL) 10 MG tablet Take 1 tablet (10 mg total) by mouth daily.  . metFORMIN (GLUCOPHAGE) 1000 MG tablet Take 1 tablet (1,000 mg total) by mouth 2 (two) times daily with a meal.  . [DISCONTINUED] apixaban (ELIQUIS) 5 MG TABS tablet Take 1 tablet (5 mg total) by mouth 2 (two) times daily.  . [DISCONTINUED] lisinopril (PRINIVIL,ZESTRIL) 10 MG tablet Take 1 tablet (10 mg total) by mouth daily.   Allergies  Allergen Reactions  . Sulfa Antibiotics Hives    Social History   Social History  . Marital status: Married    Spouse name: N/A  . Number of children: N/A  . Years of education: N/A   Social History Main Topics  . Smoking status: Current Every Day Smoker    Packs/day: 0.50    Years: 21.00    Types: Cigarettes, E-cigarettes  . Smokeless tobacco: Never Used     Comment: vapor  . Alcohol use Yes     Comment: rare  . Drug use: No  . Sexual activity: Yes    Birth control/ protection: None   Other Topics Concern  . None   Social History Narrative  . None   Family History  Problem Relation Age of Onset  . Heart failure Father   . Heart attack Father     Wt Readings from Last 3 Encounters:  06/23/16 117.1 kg (258 lb 3.2 oz)  02/13/16 117.9 kg (260 lb)  11/20/15 120.2 kg (265 lb)    PHYSICAL EXAM BP (!) 169/111   Pulse 94   Ht 5\' 8"  (1.727 m)   Wt 117.1 kg (258 lb 3.2 oz)   BMI 39.26 kg/m  General appearance: alert, cooperative, appears stated age, no distress and Moderately severely obese. Normal mood and affect. Well-nourished well-groomed. HEENT: [place]/AT, EOMI, MMM, anicteric sclera Neck: no adenopathy, no carotid bruit and no JVD Lungs: clear to auscultation bilaterally, normal percussion  bilaterally and non-labored Heart: regular rate and rhythm, S1 and S2 normal, no murmur, click, rub or gallop ; nondisplaced PMI Abdomen: soft, non-tender; bowel sounds normal; no masses,  no organomegaly; no HJR Extremities: extremities normal, atraumatic, no cyanosis, or edema Pulses: 2+ and symmetric;  Skin: mobility and turgor normal, no evidence of bleeding or bruising and no lesions noted Neurologic: Mental status: Alert, oriented, thought content appropriate Cranial nerves: normal (II-XII grossly intact)    Adult ECG Report -- post cardioversion  Rate: 96 ;  Rhythm: normal sinus rhythm and Normal axis, intervals and durations. Poor R-wave progression with what appears to be septal Q waves suggestive of possible anterior infarct, age indeterminate;   Narrative Interpretation: Stable EKG   Other studies Reviewed: Additional studies/ records that were reviewed  today include:  Recent Labs:     Chemistry      Component Value Date/Time   NA 138 05/18/2016 1100   K 4.1 05/18/2016 1100   CL 101 05/18/2016 1100   CO2 21 (L) 05/18/2016 1100   BUN 13 05/18/2016 1100   CREATININE 0.78 05/18/2016 1100      Component Value Date/Time   CALCIUM 9.7 05/18/2016 1100   ALKPHOS 62 11/20/2015 2033   AST 42 (H) 11/20/2015 2033   ALT 40 11/20/2015 2033   BILITOT 0.8 11/20/2015 2033       ASSESSMENT / PLAN: Problem List Items Addressed This Visit    Paroxysmal atrial fibrillation (HCC): CHA2DS2Vasc ~3 - Primary (Chronic)    3 episodes now essentially about 13 months apart. Each time she knows she has the symptoms. I doubt that she is having other episodes.  With EKG suggesting possible anterior infarct and her diabetes, obesity, hypertension and hyperlipidemia essentially meeting criteria for metabolic syndrome, we at least need to exclude An Ischemic Etiology. Plan: 2-D Echocardiogram and GXT (Graded Access Stress Test) -- We'll start standing dose of diltiazem XT.  - He actually was  started on ELIQUIS in the hospital. Although she sure episodes of very very fleeting, for now I think she does have an elevated CHA2DS2Vasc score - I think it's probably best for her to be on Eliquis 2 premenstrual.   We can discuss the possibility of pill in the pocket procainamide for flecainide once we can confirm no structural heart disease or ischemia on GXT.       Relevant Medications   aspirin 325 MG tablet   apixaban (ELIQUIS) 5 MG TABS tablet   lisinopril (PRINIVIL,ZESTRIL) 10 MG tablet   diltiazem (CARDIZEM CD) 120 MG 24 hr capsule   Other Relevant Orders   ECHOCARDIOGRAM COMPLETE   EXERCISE TOLERANCE TEST   Essential hypertension (Chronic)    Definitely hypertensive today. She has not been on any medications of late. She is now taking lisinopril. I will add standing dose of diltiazem the can be titrated up.      Relevant Medications   aspirin 325 MG tablet   apixaban (ELIQUIS) 5 MG TABS tablet   lisinopril (PRINIVIL,ZESTRIL) 10 MG tablet   diltiazem (CARDIZEM CD) 120 MG 24 hr capsule   Other Relevant Orders   ECHOCARDIOGRAM COMPLETE   EXERCISE TOLERANCE TEST   Abnormal EKG    Anteroseptal Q waves on EKG in a patient with cardiac risk factors and diagnosis of A. fib. Need to exclude ischemia and any structural other modalities to suggest a prior infarct. Plan: 2-D echo and GXT.      Relevant Orders   ECHOCARDIOGRAM COMPLETE   EXERCISE TOLERANCE TEST      Current medicines are reviewed at length with the patient today. (+/- concerns) None The following changes have been made: See below  Patient Instructions  SCHEDULE AT [address] Your physician has requested that you have an echocardiogram. Echocardiography is a painless test that uses sound waves to create images of your heart. It provides your doctor with information about the size and shape of your heart and how well your heart's chambers and valves are working. This procedure takes  approximately one hour. There are no restrictions for this procedure.   SCHEDULE AT [address] Your physician has requested that you have an exercise tolerance test. For further information please visit [email or web address] Please also follow instruction sheet, as given.  MEDICATIONS WERE REFILLED 90 DAY SUPPLY START DILTIAZEM  120 MG ( 24 HR DOSAGE) DAILY USE 60 MG AS NEEDED  Your physician recommends that you schedule a follow-up appointment in: JAN 2018 [facility]   If you need a refill on your cardiac medications before your next appointment, please call your pharmacy.    Studies Ordered:   Orders Placed This Encounter  Procedures  . EXERCISE TOLERANCE TEST  . ECHOCARDIOGRAM COMPLETE      Bryan Lemmaavid Bates Collington, M.D., M.S. Interventional Cardiologist   Pager # [phone] Phone # [phone] [address] [place], [place] [address]

## 2016-06-23 NOTE — Patient Instructions (Addendum)
SCHEDULE AT [address] Your physician has requested that you have an echocardiogram. Echocardiography is a painless test that uses sound waves to create images of your heart. It provides your doctor with information about the size and shape of your heart and how well your heart's chambers and valves are working. This procedure takes approximately one hour. There are no restrictions for this procedure.   SCHEDULE AT [address] Your physician has requested that you have an exercise tolerance test. For further information please visit [email or web address] Please also follow instruction sheet, as given.   MEDICATIONS WERE REFILLED 90 DAY SUPPLY START DILTIAZEM  120 MG ( 24 HR DOSAGE) DAILY USE 60 MG AS NEEDED  Your physician recommends that you schedule a follow-up appointment in: JAN 2018 [facility]   If you need a refill on your cardiac medications before your next appointment, please call your pharmacy.

## 2016-06-24 ENCOUNTER — Encounter: Payer: Self-pay | Admitting: Cardiology

## 2016-06-24 DIAGNOSIS — R9431 Abnormal electrocardiogram [ECG] [EKG]: Secondary | ICD-10-CM | POA: Insufficient documentation

## 2016-06-24 NOTE — Assessment & Plan Note (Addendum)
3 episodes now essentially about 13 months apart. Each time she knows she has the symptoms. I doubt that she is having other episodes.  With EKG suggesting possible anterior infarct and her diabetes, obesity, hypertension and hyperlipidemia essentially meeting criteria for metabolic syndrome, we at least need to exclude An Ischemic Etiology. Plan: 2-D Echocardiogram and GXT (Graded Access Stress Test) -- We'll start standing dose of diltiazem XT.  - He actually was started on ELIQUIS in the hospital. Although she sure episodes of very very fleeting, for now I think she does have an elevated CHA2DS2Vasc score - I think it's probably best for her to be on Eliquis 2 premenstrual.   We can discuss the possibility of pill in the pocket procainamide for flecainide once we can confirm no structural heart disease or ischemia on GXT.

## 2016-06-24 NOTE — Assessment & Plan Note (Signed)
Anteroseptal Q waves on EKG in a patient with cardiac risk factors and diagnosis of A. fib. Need to exclude ischemia and any structural other modalities to suggest a prior infarct. Plan: 2-D echo and GXT.

## 2016-06-24 NOTE — Assessment & Plan Note (Signed)
Definitely hypertensive today. She has not been on any medications of late. She is now taking lisinopril. I will add standing dose of diltiazem the can be titrated up.

## 2016-06-25 ENCOUNTER — Telehealth: Payer: Self-pay | Admitting: Cardiology

## 2016-06-25 HISTORY — PX: TRANSTHORACIC ECHOCARDIOGRAM: SHX275

## 2016-06-25 MED ORDER — APIXABAN 5 MG PO TABS: 5.0000 mg | ORAL_TABLET | Freq: Two times a day (BID) | ORAL | 11 refills | Status: DC

## 2016-06-25 NOTE — Telephone Encounter (Signed)
Patient reports her 90 day supply of Eliquis is too expensive.  She has been out for over a week and states she simply cannot afford it.  Informed the patient that a 30 day supply will be called in to see if it is easier to manage.  She understands the PA nurse will follow-up with her next week to see if she got the medication or if paperwork may be done to make medication more affordable.  She was grateful for assistance.

## 2016-06-25 NOTE — Telephone Encounter (Signed)
New Message  Pt c/o medication issue:  1. Name of Medication: Eliquis 5 mg tablets 5 mg total by mouth twice daily  2. How are you currently taking this medication (dosage and times per day)? See above  3. Are you having a reaction (difficulty breathing--STAT)? No  4. What is your medication issue? 3 month supply pt voiced she can't afford and would like to know her options.  Please f/u

## 2016-06-28 ENCOUNTER — Encounter (HOSPITAL_BASED_OUTPATIENT_CLINIC_OR_DEPARTMENT_OTHER): Payer: Self-pay

## 2016-06-28 ENCOUNTER — Emergency Department (HOSPITAL_BASED_OUTPATIENT_CLINIC_OR_DEPARTMENT_OTHER): Payer: 59

## 2016-06-28 ENCOUNTER — Emergency Department (HOSPITAL_BASED_OUTPATIENT_CLINIC_OR_DEPARTMENT_OTHER)
Admission: EM | Admit: 2016-06-28 | Discharge: 2016-06-29 | Disposition: A | Discharge status: Home / Self Care | Payer: 59 | Attending: Emergency Medicine | Admitting: Emergency Medicine

## 2016-06-28 DIAGNOSIS — F17290 Nicotine dependence, other tobacco product, uncomplicated: Secondary | ICD-10-CM | POA: Diagnosis not present

## 2016-06-28 DIAGNOSIS — I10 Essential (primary) hypertension: Secondary | ICD-10-CM | POA: Insufficient documentation

## 2016-06-28 DIAGNOSIS — E119 Type 2 diabetes mellitus without complications: Secondary | ICD-10-CM | POA: Diagnosis not present

## 2016-06-28 DIAGNOSIS — F17210 Nicotine dependence, cigarettes, uncomplicated: Secondary | ICD-10-CM | POA: Diagnosis not present

## 2016-06-28 DIAGNOSIS — Z7982 Long term (current) use of aspirin: Secondary | ICD-10-CM | POA: Insufficient documentation

## 2016-06-28 DIAGNOSIS — R0602 Shortness of breath: Secondary | ICD-10-CM | POA: Diagnosis present

## 2016-06-28 DIAGNOSIS — Z7984 Long term (current) use of oral hypoglycemic drugs: Secondary | ICD-10-CM | POA: Diagnosis not present

## 2016-06-28 DIAGNOSIS — Z79899 Other long term (current) drug therapy: Secondary | ICD-10-CM | POA: Insufficient documentation

## 2016-06-28 DIAGNOSIS — J189 Pneumonia, unspecified organism: Secondary | ICD-10-CM | POA: Diagnosis not present

## 2016-06-28 MED ORDER — LEVOFLOXACIN 750 MG PO TABS
750.0000 mg | ORAL_TABLET | Freq: Once | ORAL | Status: AC
  Administered 2016-06-29: 750 mg via ORAL
  Filled 2016-06-28: qty 1

## 2016-06-28 MED ORDER — APIXABAN 5 MG PO TABS: 5.0000 mg | ORAL_TABLET | Freq: Two times a day (BID) | ORAL | 0 refills | Status: DC

## 2016-06-28 MED ORDER — ALBUTEROL SULFATE HFA 108 (90 BASE) MCG/ACT IN AERS
2.0000 | INHALATION_SPRAY | Freq: Once | RESPIRATORY_TRACT | Status: AC
  Administered 2016-06-29: 2 via RESPIRATORY_TRACT
  Filled 2016-06-28: qty 6.7

## 2016-06-28 NOTE — Telephone Encounter (Signed)
LEFT MESSAGE for patient to call back .  samples availabe to pick up.  need patient to contact insurance to see what is on lower tier formulary and The cost of current medication?

## 2016-06-28 NOTE — ED Provider Notes (Signed)
[facility] Note   CSN: [id number] Arrival date & time: 06/28/16  2015   By signing my name below, I, Clovis PuAvnee Patel, attest that this documentation has been prepared under the direction and in the presence of Shon Batonourtney F Sagan Maselli, MD  Electronically Signed: Clovis PuAvnee Patel, ED Scribe. 06/28/16. 11:47 PM.   History   Chief Complaint Chief Complaint  Patient presents with  . Shortness of Breath   The history is provided by the patient. No language interpreter was used.   HPI Comments:  Julia Dickerson is a 40 y.o. female, with a hx of A-fib, HTN, hyperlipidemia, who presents to the Emergency Department complaining of sudden onset, intermittent episodes of a cough with associated SOB x yesterday. Pt states her symptoms began as generalized body aches and a fever (tmax 101.8) 3 days ago. Pt is on Eliquis but notes she has been out of it for 2 weeks. She is a smoker.   Past Medical History:  Diagnosis Date  . Anxiety   . Atrial fibrillation (HCC)   . Depression   . Diabetes mellitus without complication (HCC)   . Dysrhythmia   . GERD (gastroesophageal reflux disease)   . Hypertension   . Obesity   . PCOS (polycystic ovarian syndrome)   . UTI (lower urinary tract infection)     Patient Active Problem List   Diagnosis Date Noted  . Abnormal EKG 06/24/2016  . Paroxysmal atrial fibrillation (HCC): CHA2DS2Vasc ~3 06/23/2016  . Essential hypertension 06/23/2016  . Major depression, recurrent (HCC) 07/10/2014    Past Surgical History:  Procedure Laterality Date  . DILATION AND CURETTAGE OF UTERUS    . WISDOM TOOTH EXTRACTION      OB History    Gravida Para Term Preterm AB Living   2       2     SAB TAB Ectopic Multiple Live Births                   Home Medications    Prior to Admission medications   Medication Sig Start Date End Date Taking? Authorizing Provider  albuterol (PROVENTIL HFA;VENTOLIN HFA) 108 (90 Base) MCG/ACT inhaler Inhale 2 puffs into  the lungs every 4 (four) hours as needed for wheezing or shortness of breath. 06/29/16   Shon Batonourtney F Katai Marsico, MD  apixaban (ELIQUIS) 5 MG TABS tablet Take 1 tablet (5 mg total) by mouth 2 (two) times daily. 06/28/16   Marykay Lexavid W Harding, MD  aspirin 325 MG tablet Take 325 mg by mouth daily.    Historical Provider, MD  atorvastatin (LIPITOR) 20 MG tablet Take 1 tablet (20 mg total) by mouth daily. 07/13/14   Thermon LeylandLaura A Davis, NP  co-enzyme Q-10 30 MG capsule Take 30 mg by mouth daily.    Historical Provider, MD  diltiazem (CARDIZEM CD) 120 MG 24 hr capsule Take 1 capsule (120 mg total) by mouth daily. 06/23/16   Marykay Lexavid W Harding, MD  diltiazem (CARDIZEM) 60 MG tablet Take 1 tablet if needed for palpitations. Can be taken every 6 hours. 06/05/15   Gilda Creasehristopher J Pollina, MD  glimepiride (AMARYL) 2 MG tablet Take 2 mg by mouth daily with breakfast.    Historical Provider, MD  levofloxacin (LEVAQUIN) 750 MG tablet Take 1 tablet (750 mg total) by mouth daily. 06/29/16   Shon Batonourtney F Bryden Darden, MD  lisinopril (PRINIVIL,ZESTRIL) 10 MG tablet Take 1 tablet (10 mg total) by mouth daily. 06/23/16   Marykay Lexavid W Harding, MD  metFORMIN (GLUCOPHAGE) 1000  MG tablet Take 1 tablet (1,000 mg total) by mouth 2 (two) times daily with a meal. 07/13/14   Thermon Leyland, NP    Family History Family History  Problem Relation Age of Onset  . Heart failure Father   . Heart attack Father     Social History Social History  Substance Use Topics  . Smoking status: Current Every Day Smoker    Packs/day: 0.50    Years: 21.00    Types: Cigarettes, E-cigarettes  . Smokeless tobacco: Never Used     Comment: vapor  . Alcohol use Yes     Comment: rare     Allergies   Sulfa antibiotics   Review of Systems Review of Systems  Constitutional: Positive for fever.  HENT: Positive for congestion and rhinorrhea.   Respiratory: Positive for cough and shortness of breath.   Cardiovascular: Negative for chest pain.  Gastrointestinal: Negative  for abdominal pain, nausea and vomiting.  Musculoskeletal: Positive for myalgias.  All other systems reviewed and are negative.  Physical Exam Updated Vital Signs BP 151/87 (BP Location: Right Arm)   Pulse 106   Temp 98.4 F (36.9 C) (Oral)   Resp 18   Ht 5\' 8"  (1.727 m)   Wt 258 lb (117 kg)   LMP 04/28/2016 (Approximate)   SpO2 94%   BMI 39.23 kg/m   Physical Exam  Constitutional: She is oriented to person, place, and time. She appears well-developed and well-nourished. No distress.  Obese  HENT:  Head: Normocephalic and atraumatic.  Cardiovascular: Normal rate and regular rhythm.   No murmur heard. Pulmonary/Chest: Effort normal. No respiratory distress.  Wheezing and rhonchi noted right lower lobe  Abdominal: Soft. Bowel sounds are normal. She exhibits no distension. There is no tenderness.  Musculoskeletal:  Trace bilateral lower extremity edema  Neurological: She is alert and oriented to person, place, and time.  Skin: Skin is warm and dry.  Psychiatric: She has a normal mood and affect.  Nursing note and vitals reviewed.   ED Treatments / Results  DIAGNOSTIC STUDIES:  Oxygen Saturation is 97% on RA, normal by my interpretation.    COORDINATION OF CARE:  11:46 PM Discussed treatment plan with pt at bedside and pt agreed to plan.  Labs (all labs ordered are listed, but only abnormal results are displayed) Labs Reviewed - No data to display  EKG  EKG Interpretation None       Radiology Dg Chest 2 View  Result Date: 06/28/2016 CLINICAL DATA:  40 year old female with cough, shortness of breath and fever. EXAM: CHEST  2 VIEW COMPARISON:  07/31/2015 and prior chest radiographs FINDINGS: The cardiomediastinal silhouette is unremarkable. Patchy opacities within both lower lungs, left-greater-than-right, are suspicious for pneumonia. Edema is considered less likely. There is no evidence of suspicious pulmonary nodule/mass, pleural effusion, or pneumothorax. No  acute bony abnormalities are identified. IMPRESSION: Bilateral lower lung patchy opacities suspicious for infection/ pneumonia. Edema is considered less likely-correlate clinically. Electronically Signed   By: Harmon Pier M.D.   On: 06/28/2016 20:51    Procedures Procedures (including critical care time)  Medications Ordered in ED Medications  albuterol (PROVENTIL HFA;VENTOLIN HFA) 108 (90 Base) MCG/ACT inhaler 2 puff (2 puffs Inhalation Given 06/29/16 0043)  levofloxacin (LEVAQUIN) tablet 750 mg (750 mg Oral Given 06/29/16 0035)     Initial Impression / Assessment and Plan / ED Course  I have reviewed the triage vital signs and the nursing notes.  Pertinent labs & imaging results that were  available during my care of the patient were reviewed by me and considered in my medical decision making (see chart for details).  Clinical Course     Patient presents with upper respiratory symptoms, fever, cough. She is a smoker. She is nontoxic on exam. Wheezing and rhonchi mostly in the right lung. X-ray concerning for pneumonia. Given that she is a smoker, feel Levaquin is the best antibiotic choice. She is given a dose of Levaquin and an inhaler. She is able to ambulate and maintain her pulse ox greater than 94%. Will discharge home  After history, exam, and medical workup I feel the patient has been appropriately medically screened and is safe for discharge home. Pertinent diagnoses were discussed with the patient. Patient was given return precautions.   Final Clinical Impressions(s) / ED Diagnoses   Final diagnoses:  Community acquired pneumonia, unspecified laterality    New Prescriptions Discharge Medication List as of 06/29/2016 12:36 AM    START taking these medications   Details  albuterol (PROVENTIL HFA;VENTOLIN HFA) 108 (90 Base) MCG/ACT inhaler Inhale 2 puffs into the lungs every 4 (four) hours as needed for wheezing or shortness of breath., Starting Tue 06/29/2016, Print      levofloxacin (LEVAQUIN) 750 MG tablet Take 1 tablet (750 mg total) by mouth daily., Starting Tue 06/29/2016, Print       I personally performed the services described in this documentation, which was scribed in my presence. The recorded information has been reviewed and is accurate.     Shon Batonourtney F Theophile Harvie, MD 06/29/16 [phone]

## 2016-06-28 NOTE — ED Triage Notes (Signed)
She got a cold over the weekend. Today she has been SOB and coughing. Ribs hurt from coughing so hard.

## 2016-06-28 NOTE — ED Notes (Signed)
All documentation under EM, EMT name was done by SR, RN

## 2016-06-29 MED ORDER — LEVOFLOXACIN 750 MG PO TABS: 750.0000 mg | ORAL_TABLET | Freq: Every day | ORAL | 0 refills | Status: DC

## 2016-06-29 MED ORDER — ALBUTEROL SULFATE HFA 108 (90 BASE) MCG/ACT IN AERS: 2.0000 | INHALATION_SPRAY | RESPIRATORY_TRACT | 0 refills | Status: DC | PRN

## 2016-06-29 NOTE — ED Notes (Signed)
Pt ambulated with pulse oximetry around the nurses station. Pt maintained SpO2 at 94-95% during ambulation and HR of 120.

## 2016-06-30 NOTE — Telephone Encounter (Signed)
Spoke to patient  Patient states a month supply of eliquis cost $100 a month. She states she can not afford medication. RN informed patient - samples available for pick up. 2 options 1 - contact [phone] for assistance from company 2- contact paitent's insurance to find out what is on tier 1 or 2 formulary to switch.  3- switch to warfarin/coumadin.- require frequent office visit.  Patient voiced understanding and will contact insurance and contact office with the answer. Patient states she will pick up the medications

## 2016-07-06 NOTE — Telephone Encounter (Signed)
LATE ENTRY patient states all medication on formulary are $100 dollars a month and she can not afford.  samples picked up and saving card with #800 number for assistance from company give to patient. Patient states she will call number to see if assistance is available ,if not will contact office with other options.

## 2016-07-07 ENCOUNTER — Inpatient Hospital Stay (HOSPITAL_COMMUNITY): Admission: RE | Admit: 2016-07-07 | Payer: Self-pay | Source: Ambulatory Visit

## 2016-07-15 ENCOUNTER — Other Ambulatory Visit: Payer: Self-pay

## 2016-07-15 ENCOUNTER — Ambulatory Visit (HOSPITAL_COMMUNITY): Payer: 59 | Attending: Internal Medicine

## 2016-07-15 DIAGNOSIS — Z8249 Family history of ischemic heart disease and other diseases of the circulatory system: Secondary | ICD-10-CM | POA: Insufficient documentation

## 2016-07-15 DIAGNOSIS — E669 Obesity, unspecified: Secondary | ICD-10-CM | POA: Insufficient documentation

## 2016-07-15 DIAGNOSIS — I10 Essential (primary) hypertension: Secondary | ICD-10-CM | POA: Diagnosis not present

## 2016-07-15 DIAGNOSIS — I480 Paroxysmal atrial fibrillation: Secondary | ICD-10-CM | POA: Diagnosis not present

## 2016-07-15 DIAGNOSIS — R9431 Abnormal electrocardiogram [ECG] [EKG]: Secondary | ICD-10-CM | POA: Diagnosis not present

## 2016-07-16 ENCOUNTER — Telehealth: Payer: Self-pay | Admitting: *Deleted

## 2016-07-16 MED ORDER — APIXABAN 5 MG PO TABS: 5.0000 mg | ORAL_TABLET | Freq: Two times a day (BID) | ORAL | 0 refills | Status: DC

## 2016-07-16 NOTE — Telephone Encounter (Signed)
-----   Message from Marykay Lexavid W Harding, MD sent at 07/15/2016  8:43 PM EST ----- Overall normal echocardiogram. Normal pump function. Mildly increased thickness, but no problems with relaxation. Normal valve function.  Pending results on treadmill stress test, we can probably start considering the possibility of using a pill in the pocket flecainide or procainamide.   Bryan Lemmaavid Harding, MD

## 2016-07-16 NOTE — Telephone Encounter (Signed)
Spoke to patient. Result given . Verbalized understanding PATIENT HAS NOT CONTACTED DRUG COMPANY YET TO SEE IF MEDICATION CAN BE OBTAINED FOR CHEAPER  OR NO COST TO HER. SAMPLES AR AVAILABLE FOR PICK UP

## 2016-07-20 ENCOUNTER — Telehealth (HOSPITAL_COMMUNITY): Payer: Self-pay

## 2016-07-20 NOTE — Telephone Encounter (Signed)
Encounter complete. 

## 2016-07-22 ENCOUNTER — Ambulatory Visit (HOSPITAL_COMMUNITY)
Admission: RE | Admit: 2016-07-22 | Discharge: 2016-07-22 | Disposition: A | Discharge status: Home / Self Care | Payer: 59 | Source: Ambulatory Visit | Attending: Cardiology | Admitting: Cardiology

## 2016-07-22 DIAGNOSIS — I480 Paroxysmal atrial fibrillation: Secondary | ICD-10-CM | POA: Diagnosis not present

## 2016-07-22 DIAGNOSIS — I10 Essential (primary) hypertension: Secondary | ICD-10-CM | POA: Insufficient documentation

## 2016-07-22 DIAGNOSIS — R9431 Abnormal electrocardiogram [ECG] [EKG]: Secondary | ICD-10-CM | POA: Insufficient documentation

## 2016-07-22 HISTORY — PX: OTHER SURGICAL HISTORY: SHX169

## 2016-07-22 LAB — EXERCISE TOLERANCE TEST
Estimated workload: 7 METS
Exercise duration (min): 6 min
Exercise duration (sec): 0 s
MPHR: 181 {beats}/min
Peak HR: 146 {beats}/min
Percent HR: 80 %
RPE: 17
Rest HR: 94 {beats}/min

## 2016-07-27 ENCOUNTER — Ambulatory Visit (HOSPITAL_COMMUNITY)
Admission: EM | Admit: 2016-07-27 | Discharge: 2016-07-27 | Disposition: A | Discharge status: Home / Self Care | Payer: 59 | Attending: Family Medicine | Admitting: Family Medicine

## 2016-07-27 ENCOUNTER — Encounter (HOSPITAL_COMMUNITY): Payer: Self-pay | Admitting: Family Medicine

## 2016-07-27 DIAGNOSIS — B9789 Other viral agents as the cause of diseases classified elsewhere: Secondary | ICD-10-CM | POA: Diagnosis not present

## 2016-07-27 DIAGNOSIS — J069 Acute upper respiratory infection, unspecified: Secondary | ICD-10-CM | POA: Diagnosis not present

## 2016-07-27 MED ORDER — BENZONATATE 100 MG PO CAPS: 100.0000 mg | ORAL_CAPSULE | Freq: Three times a day (TID) | ORAL | 0 refills | Status: DC

## 2016-07-27 NOTE — ED Provider Notes (Signed)
CSN: [id number]     Arrival date & time 07/27/16  1257 History   None    Chief Complaint  Patient presents with  . Cough   (Consider location/radiation/quality/duration/timing/severity/associated sxs/prior Treatment) 41 year old female presents to clinic with cough and congestion symptoms since Sunday. Patient denies fever, N/V/D, body aches, or shortness of breath. She has not had to use her inhaler for shortness of breath or wheezing. She reports her cough has been wet and productive with white sputum. She has taken OTC medicines without relief.   The history is provided by the patient.  Cough  Associated symptoms: no chest pain, no chills, no diaphoresis, no ear pain, no fever, no shortness of breath, no sore throat and no wheezing     Past Medical History:  Diagnosis Date  . Anxiety   . Atrial fibrillation (HCC)   . Depression   . Diabetes mellitus without complication (HCC)   . Dysrhythmia   . GERD (gastroesophageal reflux disease)   . Hypertension   . Obesity   . PCOS (polycystic ovarian syndrome)   . UTI (lower urinary tract infection)    Past Surgical History:  Procedure Laterality Date  . DILATION AND CURETTAGE OF UTERUS    . WISDOM TOOTH EXTRACTION     Family History  Problem Relation Age of Onset  . Heart failure Father   . Heart attack Father    Social History  Substance Use Topics  . Smoking status: Current Every Day Smoker    Packs/day: 0.50    Years: 21.00    Types: Cigarettes, E-cigarettes  . Smokeless tobacco: Never Used     Comment: vapor  . Alcohol use Yes     Comment: rare   OB History    Gravida Para Term Preterm AB Living   2       2     SAB TAB Ectopic Multiple Live Births                 Review of Systems  Constitutional: Negative for chills, diaphoresis and fever.  HENT: Positive for congestion. Negative for ear pain, sinus pain, sinus pressure, sneezing and sore throat.   Eyes: Negative.   Respiratory: Positive for cough. Negative  for shortness of breath and wheezing.   Cardiovascular: Negative for chest pain.  Gastrointestinal: Negative for abdominal pain, diarrhea, nausea and vomiting.  Genitourinary: Negative.   Musculoskeletal: Negative.     Allergies  Sulfa antibiotics  Home Medications   Prior to Admission medications   Medication Sig Start Date End Date Taking? Authorizing Provider  albuterol (PROVENTIL HFA;VENTOLIN HFA) 108 (90 Base) MCG/ACT inhaler Inhale 2 puffs into the lungs every 4 (four) hours as needed for wheezing or shortness of breath. 06/29/16   Shon Baton, MD  apixaban (ELIQUIS) 5 MG TABS tablet Take 1 tablet (5 mg total) by mouth 2 (two) times daily. 07/16/16   Marykay Lex, MD  aspirin 325 MG tablet Take 325 mg by mouth daily.    Historical Provider, MD  atorvastatin (LIPITOR) 20 MG tablet Take 1 tablet (20 mg total) by mouth daily. 07/13/14   Thermon Leyland, NP  benzonatate (TESSALON) 100 MG capsule Take 1 capsule (100 mg total) by mouth every 8 (eight) hours. 07/27/16   Dorena Bodo, NP  co-enzyme Q-10 30 MG capsule Take 30 mg by mouth daily.    Historical Provider, MD  diltiazem (CARDIZEM CD) 120 MG 24 hr capsule Take 1 capsule (120 mg total)  by mouth daily. 06/23/16   Marykay Lexavid W Harding, MD  diltiazem (CARDIZEM) 60 MG tablet Take 1 tablet if needed for palpitations. Can be taken every 6 hours. 06/05/15   Gilda Creasehristopher J Pollina, MD  glimepiride (AMARYL) 2 MG tablet Take 2 mg by mouth daily with breakfast.    Historical Provider, MD  levofloxacin (LEVAQUIN) 750 MG tablet Take 1 tablet (750 mg total) by mouth daily. 06/29/16   Shon Batonourtney F Horton, MD  lisinopril (PRINIVIL,ZESTRIL) 10 MG tablet Take 1 tablet (10 mg total) by mouth daily. 06/23/16   Marykay Lexavid W Harding, MD  metFORMIN (GLUCOPHAGE) 1000 MG tablet Take 1 tablet (1,000 mg total) by mouth 2 (two) times daily with a meal. 07/13/14   Thermon LeylandLaura A Davis, NP   Meds Ordered and Administered this Visit  Medications - No data to display  BP  94/69 (BP Location: Left Arm)   Pulse 114   Temp 98.3 F (36.8 C) (Oral)   Resp 16   LMP 04/28/2016 (Approximate)   SpO2 98%  No data found.   Physical Exam  Constitutional: She is oriented to person, place, and time. She appears well-developed and well-nourished. No distress.  HENT:  Head: Normocephalic.  Right Ear: External ear normal.  Left Ear: External ear normal.  Eyes: Pupils are equal, round, and reactive to light.  Neck: Normal range of motion. No JVD present.  Cardiovascular: Normal rate and regular rhythm.   Pulmonary/Chest: Effort normal and breath sounds normal. She has no wheezes. She has no rhonchi. She has no rales.  Abdominal: Soft. Bowel sounds are normal.  Lymphadenopathy:    She has no cervical adenopathy.  Neurological: She is alert and oriented to person, place, and time.  Skin: Skin is warm and dry. Capillary refill takes less than 2 seconds. She is not diaphoretic.  Nursing note and vitals reviewed.   Urgent Care Course   Clinical Course     Procedures (including critical care time)  Labs Review Labs Reviewed - No data to display  Imaging Review No results found.   Visual Acuity Review  Right Eye Distance:   Left Eye Distance:   Bilateral Distance:    Right Eye Near:   Left Eye Near:    Bilateral Near:         MDM   1. Viral URI with cough    Patient encouraged to stop smoking and was prescribed Tessalon for cough. She may continue to take Mucinex as needed with full glass of water, and rest. Should symptoms fail to improve or worsen follow up with PCP or return to clinic.     Dorena BodoLawrence Breandan People, NP 07/27/16 [phone]

## 2016-07-27 NOTE — ED Triage Notes (Signed)
Pt here for URI symptoms since Friday.  

## 2016-07-27 NOTE — Discharge Instructions (Signed)
I recommend smoking cessation, take your cough medicine as prescribed. Continue to take mucenix with a full glass of water. Rest and drink plenty of fluids. Should symptoms fail to improve or worsen follow up with your primary care provider or return to clinic.

## 2016-07-29 DIAGNOSIS — E119 Type 2 diabetes mellitus without complications: Secondary | ICD-10-CM | POA: Diagnosis not present

## 2016-07-29 DIAGNOSIS — I10 Essential (primary) hypertension: Secondary | ICD-10-CM | POA: Diagnosis not present

## 2016-08-02 ENCOUNTER — Telehealth: Payer: Self-pay | Admitting: *Deleted

## 2016-08-02 NOTE — Telephone Encounter (Signed)
-----   Message from Marykay Lexavid W Harding, MD sent at 07/29/2016  7:33 PM EST ----- Overall pretty good results on the stress test. Probably had a hard time increasing heart rate secondary to medications.  Based on the fact that there were no EKG changes and no symptoms with exertion, I think we can probably consider using a pill in the pocket technique. We can discuss this and follow-up.   Bryan Lemmaavid Harding, MD

## 2016-08-02 NOTE — Telephone Encounter (Signed)
LEFT DETAIL RESULT MESSAGE ON VOICE MAIL,KEEP APPOINTMENT 08/12/16 ANY QUESTION MAY CALL BACK

## 2016-08-12 ENCOUNTER — Ambulatory Visit: Payer: Self-pay | Admitting: Cardiology

## 2016-08-18 ENCOUNTER — Ambulatory Visit (INDEPENDENT_AMBULATORY_CARE_PROVIDER_SITE_OTHER): Payer: 59 | Admitting: Cardiology

## 2016-08-18 VITALS — BP 128/78 | HR 92 | Ht 68.0 in | Wt 257.2 lb

## 2016-08-18 DIAGNOSIS — I480 Paroxysmal atrial fibrillation: Secondary | ICD-10-CM

## 2016-08-18 DIAGNOSIS — I10 Essential (primary) hypertension: Secondary | ICD-10-CM | POA: Diagnosis not present

## 2016-08-18 MED ORDER — APIXABAN 5 MG PO TABS: 5.0000 mg | ORAL_TABLET | Freq: Two times a day (BID) | ORAL | 0 refills | Status: DC | PRN

## 2016-08-18 MED ORDER — FLECAINIDE ACETATE 100 MG PO TABS: 200.0000 mg | ORAL_TABLET | ORAL | 6 refills | Status: DC | PRN

## 2016-08-18 NOTE — Patient Instructions (Addendum)
Medication Instructions:  START- Flecainide 200 mg take 1 tablets as needed for A-Fib, If after 12 hours and you still in A-fib take 100 mg of Flecainide, Must take Diltiazem when taking Flecainide. May stop Eliquis for now, if you need to use the above flecainide for afib - restart Eliquis twice a day for 1 month after afib episode. Labwork: None Ordered  Testing/Procedures: None Ordered  Follow-Up: Your physician wants you to follow-up in: 1 Year. You will receive a reminder letter in the mail two months in advance. If you don't receive a letter, please call our office to schedule the follow-up appointment.   Any Other Special Instructions Will Be Listed Below (If Applicable).   If you need a refill on your cardiac medications before your next appointment, please call your pharmacy.

## 2016-08-18 NOTE — Progress Notes (Signed)
PCP: Caroll Rancher, NP  Clinic Note: Chief Complaint  Patient presents with  . Follow-up    pt denied chest pain and SOB  . Atrial Fibrillation    HPI: Julia Dickerson is a 41 y.o. female with a PMH below who presents today for initial cardiology consultation as part of ER follow-up. She apparently has a diagnosis of paroxysmal A. fib, and presented emergency room on October 24  with palpitations. She is noted to be in atrial flutter with variable block/RVR. She took her when necessary diltiazem and no change. She was given procainamide in the ER after diltiazem. This converted her to sinus rhythm. CHA2DS2Vasc calculated to be 3. She was started on ELIQUIS.  She does not like being on full anticoagulation.  Julia Dickerson was seen on in the ER in Nov 2016 & most recently in Oct 2017.  I then saw her on November 29 in consultation. We decided to do and just GXT an echocardiogram in order to determine if there is any structural other maladies for cardiac disease which would preclude the concept of pill in the pocket. We did keep her on Eliquis, however I had a long talk with her about it and resected would be discussed in today.  Recent Hospitalizations: ER visit October 20 --> discharge with when necessary diltiazem 60 mg. This did not seem to break it the last time she had an episode.During this episode he she was cardioverted with procainamide following initiation of diltiazem drip.  Studies Reviewed: No  Echo: EF 6570% with mild LVH. Normal wall motion and diastolic function. Mild RAE. Otherwise normal.  GXT July 22, 2016: Exercise time 6 minutes - 7 METs. Maximum heart rate 146 BPM. - No EKG changes. No arrhythmias or chest pain.: Low risk test.   Interval History: Julia Dickerson presents today for follow-up evaluation of her recurrent atrial fibrillation episode. Her first episode was actually in September 2016. She then again had an episode in November 2016 before the most recent episode in  October this year. Each time she was chemically converted her cardioverted to sinus rhythm with discharged from the emergency room. She never did seek follow-up with cardiology.  Other than these episodes of A. fib, she has not had any other cardiac issues. She definitely knows when she is in atrial fibrillation she feels her heart racing with irregularity she gets short of breath and discomfort in her left shoulder and chest. When she converts to sinus rhythm she notes the symptoms 100% abate.  She really doesn't feel dizzy or lightheaded unless it goes on long. She never had any syncope or near syncope associated with it.  Otherwise her cardiac review of symptoms is as follows: No chest pain or shortness of breath with rest or exertion.  No PND, orthopnea or edema.  No lightheadedness, dizziness, weakness or syncope/near syncope. No TIA/amaurosis fugax symptoms.  ROS: A comprehensive was performed. Review of Systems  Constitutional: Negative for malaise/fatigue.  HENT: Negative for congestion and sinus pain.   Eyes: Negative.   Respiratory: Negative for cough, shortness of breath and wheezing.   Cardiovascular:       Per history of present illness  Gastrointestinal: Negative for blood in stool and melena.  Genitourinary: Negative for hematuria.  Musculoskeletal: Negative for joint pain.  Skin: Negative.   Neurological: Negative for dizziness, focal weakness and weakness.  Psychiatric/Behavioral: Negative for depression and memory loss. The patient is not nervous/anxious and does not have insomnia.  Past Medical History:  Diagnosis Date  . Anxiety   . Atrial fibrillation (HCC)   . Depression   . Diabetes mellitus without complication (HCC)   . Dysrhythmia   . GERD (gastroesophageal reflux disease)   . Hypertension   . Obesity   . PCOS (polycystic ovarian syndrome)   . UTI (lower urinary tract infection)     Past Surgical History:  Procedure Laterality Date  . DILATION  AND CURETTAGE OF UTERUS    . GXT - Graded Exercise Tolerance Test  07/22/2016   Exercise time 6 minutes - 7 METs. Maximum heart rate 146 BPM. - No EKG changes. No arrhythmias or chest pain.: Low risk test.  . TRANSTHORACIC ECHOCARDIOGRAM  06/2016   EF 6570% with mild LVH. Normal wall motion and diastolic function. Mild RAE. Otherwise normal.  . WISDOM TOOTH EXTRACTION     Current Meds  Medication Sig  . apixaban (ELIQUIS) 5 MG TABS tablet Take 1 tablet (5 mg total) by mouth 2 (two) times daily as needed.  [name]. aspirin 325 MG tablet Take 325 mg by mouth daily.  [name]. atorvastatin (LIPITOR) 20 MG tablet Take 1 tablet (20 mg total) by mouth daily.  . Cholecalciferol (VITAMIN D3) 50000 units CAPS Take 1 tablet by mouth once a week.  . co-enzyme Q-10 30 MG capsule Take 30 mg by mouth daily.  [name]. diltiazem (CARDIZEM CD) 120 MG 24 hr capsule Take 1 capsule (120 mg total) by mouth daily.  [name]. diltiazem (CARDIZEM) 60 MG tablet Take 1 tablet if needed for palpitations. Can be taken every 6 hours.  [name]. glimepiride (AMARYL) 2 MG tablet Take 2 mg by mouth daily with breakfast.  . lisinopril (PRINIVIL,ZESTRIL) 10 MG tablet Take 1 tablet (10 mg total) by mouth daily.  . metFORMIN (GLUCOPHAGE) 1000 MG tablet Take 1 tablet (1,000 mg total) by mouth 2 (two) times daily with a meal.  . [DISCONTINUED] apixaban (ELIQUIS) 5 MG TABS tablet Take 1 tablet (5 mg total) by mouth 2 (two) times daily.   Allergies  Allergen Reactions  . Sulfa Antibiotics Hives    Social History   Social History  . Marital status: Married    Spouse name: N/A  . Number of children: N/A  . Years of education: N/A   Social History Main Topics  . Smoking status: Current Every Day Smoker    Packs/day: 0.50    Years: 21.00    Types: Cigarettes, E-cigarettes  . Smokeless tobacco: Never Used     Comment: vapor  . Alcohol use Yes     Comment: rare  . Drug use: No  . Sexual activity: Yes    Birth control/ protection: None   Other Topics  Concern  . None   Social History Narrative  . None   Family History  Problem Relation Age of Onset  . Heart failure Father   . Heart attack Father     Wt Readings from Last 3 Encounters:  08/18/16 116.7 kg (257 lb 3.2 oz)  06/28/16 117 kg (258 lb)  06/23/16 117.1 kg (258 lb 3.2 oz)    PHYSICAL EXAM BP 128/78   Pulse 92   Ht 5\' 8"  (1.727 m)   Wt 116.7 kg (257 lb 3.2 oz)   BMI 39.11 kg/m  General appearance: alert, cooperative, appears stated age, no distress and Moderately severely obese. Normal mood and affect. Well-nourished well-groomed. HEENT: [place]/AT, EOMI, MMM, anicteric sclera Neck: no adenopathy, no carotid bruit and no JVD Lungs: clear to  auscultation bilaterally, normal percussion bilaterally and non-labored Heart: regular rate and rhythm, S1 and S2 normal, no murmur, click, rub or gallop ; nondisplaced PMI Abdomen: soft, non-tender; bowel sounds normal; no masses,  no organomegaly; no HJR Extremities: extremities normal, atraumatic, no cyanosis, or edema Pulses: 2+ and symmetric;  Skin: mobility and turgor normal, no evidence of bleeding or bruising and no lesions noted Neurologic: Mental status: Alert, oriented, thought content appropriate Cranial nerves: normal (II-XII grossly intact)    Adult ECG Report -- post cardioversion  Rate: 96 ;  Rhythm: normal sinus rhythm and Normal axis, intervals and durations. Poor R-wave progression with what appears to be septal Q waves suggestive of possible anterior infarct, age indeterminate;   Narrative Interpretation: Stable EKG   Other studies Reviewed: Additional studies/ records that were reviewed today include:  Recent Labs:   Lab Results  Component Value Date   CREATININE 0.78 05/18/2016   BUN 13 05/18/2016   NA 138 05/18/2016   K 4.1 05/18/2016   CL 101 05/18/2016   CO2 21 (L) 05/18/2016     ASSESSMENT / PLAN: Problem List Items Addressed This Visit    Paroxysmal atrial fibrillation (HCC): CHA2DS2Vasc ~3 -  Primary (Chronic)    Now still 23 episodes in the last 3 years. Very infrequent episodes that the pocket is a great idea  She will continue diltiazem for rate control. Plan: When necessary flecainide 200 mg 1 followed by another dose in 12 hours if still in A. Fib. -- She asked about ELIQUIS. I think that is infrequently she is having episodes, we can go with a when necessary ELIQUIS as well. She knows what she is in A. fib, which he feels is starting she will start her ELIQUIS and take her flecainide. She will then take ELIQUIS for 1 month and then stop As she gets older, we would consider going back on full anticoagulation      Relevant Medications   flecainide (TAMBOCOR) 100 MG tablet   apixaban (ELIQUIS) 5 MG TABS tablet   Other Relevant Orders   EKG 12-Lead (Completed)   Essential hypertension (Chronic)    Well-controlled on lisinopril and diltiazem      Relevant Medications   flecainide (TAMBOCOR) 100 MG tablet   apixaban (ELIQUIS) 5 MG TABS tablet      Current medicines are reviewed at length with the patient today. (+/- concerns) None The following changes have been made: See below  Patient Instructions  Medication Instructions:  START- Flecainide 200 mg take 1 tablets as needed for A-Fib, If after 12 hours and you still in A-fib take 100 mg of Flecainide, Must take Diltiazem when taking Flecainide. May stop Eliquis for now, if you need to use the above flecainide for afib - restart Eliquis twice a day for 1 month after afib episode. Labwork: None Ordered  Testing/Procedures: None Ordered  Follow-Up: Your physician wants you to follow-up in: 1 Year. You will receive a reminder letter in the mail two months in advance. If you don't receive a letter, please call our office to schedule the follow-up appointment.   Any Other Special Instructions Will Be Listed Below (If Applicable).   If you need a refill on your cardiac medications before your next appointment,  please call your pharmacy.   Studies Ordered:   Orders Placed This Encounter  Procedures  . EKG 12-Lead      Bryan Lemma, M.D., M.S. Interventional Cardiologist   Pager # [phone] Phone # [phone] [address]  [place]. [place], [place] [address]

## 2016-08-20 ENCOUNTER — Encounter: Payer: Self-pay | Admitting: Cardiology

## 2016-08-20 NOTE — Assessment & Plan Note (Signed)
Now still 23 episodes in the last 3 years. Very infrequent episodes that the pocket is a great idea  She will continue diltiazem for rate control. Plan: When necessary flecainide 200 mg 1 followed by another dose in 12 hours if still in A. Fib. -- She asked about ELIQUIS. I think that is infrequently she is having episodes, we can go with a when necessary ELIQUIS as well. She knows what she is in A. fib, which he feels is starting she will start her ELIQUIS and take her flecainide. She will then take ELIQUIS for 1 month and then stop As she gets older, we would consider going back on full anticoagulation

## 2016-08-20 NOTE — Assessment & Plan Note (Signed)
Well-controlled on lisinopril and diltiazem

## 2016-09-01 ENCOUNTER — Emergency Department (HOSPITAL_COMMUNITY)
Admission: EM | Admit: 2016-09-01 | Discharge: 2016-09-02 | Disposition: A | Discharge status: Home / Self Care | Payer: 59 | Attending: Emergency Medicine | Admitting: Emergency Medicine

## 2016-09-01 ENCOUNTER — Encounter (HOSPITAL_COMMUNITY): Payer: Self-pay | Admitting: *Deleted

## 2016-09-01 DIAGNOSIS — Z5321 Procedure and treatment not carried out due to patient leaving prior to being seen by health care provider: Secondary | ICD-10-CM | POA: Diagnosis not present

## 2016-09-01 DIAGNOSIS — M79641 Pain in right hand: Secondary | ICD-10-CM | POA: Insufficient documentation

## 2016-09-01 NOTE — ED Triage Notes (Signed)
The pt is c/o rt hand pain since December.  No known injury and now the pain has spread to her rt arm and her rt abd

## 2016-09-02 ENCOUNTER — Encounter (HOSPITAL_COMMUNITY): Payer: Self-pay | Admitting: Family Medicine

## 2016-09-02 ENCOUNTER — Ambulatory Visit (INDEPENDENT_AMBULATORY_CARE_PROVIDER_SITE_OTHER): Payer: 59

## 2016-09-02 ENCOUNTER — Emergency Department (HOSPITAL_COMMUNITY): Payer: 59

## 2016-09-02 ENCOUNTER — Ambulatory Visit (INDEPENDENT_AMBULATORY_CARE_PROVIDER_SITE_OTHER)
Admission: EM | Admit: 2016-09-02 | Discharge: 2016-09-02 | Disposition: A | Discharge status: Home / Self Care | Payer: 59 | Source: Home / Self Care | Attending: Emergency Medicine | Admitting: Emergency Medicine

## 2016-09-02 DIAGNOSIS — M5412 Radiculopathy, cervical region: Secondary | ICD-10-CM | POA: Diagnosis not present

## 2016-09-02 DIAGNOSIS — M79641 Pain in right hand: Secondary | ICD-10-CM | POA: Diagnosis not present

## 2016-09-02 DIAGNOSIS — M542 Cervicalgia: Secondary | ICD-10-CM

## 2016-09-02 MED ORDER — DICLOFENAC SODIUM 75 MG PO TBEC: 75.0000 mg | DELAYED_RELEASE_TABLET | Freq: Two times a day (BID) | ORAL | 0 refills | Status: DC

## 2016-09-02 MED ORDER — METHYLPREDNISOLONE 4 MG PO TBPK: ORAL_TABLET | ORAL | 0 refills | Status: DC

## 2016-09-02 NOTE — Discharge Instructions (Signed)
The steroids will elevate your blood sugars while you are taking it. Take 1 g of Tylenol with the diclofenac twice a day as needed. Follow-up with Dr. Mikal Planeabell, neurosurgery or Dr. Roda ShuttersXu, orthopedics. You may benefit from physical therapy and may need some advanced imaging.

## 2016-09-02 NOTE — ED Triage Notes (Signed)
Pt here for right hand pain and pressure more in her right thumb. sts some numbness. sts that sometime when she moves her neck that pain shoots down her right arm abd is sharp and stabbing.

## 2016-09-02 NOTE — ED Provider Notes (Signed)
HPI  SUBJECTIVE:  Julia Dickerson is a 41 y.o. female who presents with intermittent right-sided neck pain that is present depending on her activity. This is been present for a month. Describes pain as burning, shooting down her arm and the right side of her back and jaw. States that it feels like it is "pulling". She tried massage, ibuprofen and some leftover Norco with no relief in her symptoms. Symptoms are worse with neck extension. It is not associated flexion, rotation, lateral bending. She denies any exertional component to this. No chest pain or shortness of breath. She also describes numbness, dull achy pain in her right index middle, thumb since mid January. She is right-handed and states that she has been doing a lot of knitting and states that she does a lot of repetitive motions at work. She reports some grip weakness particularly in the intrinsic muscles. She denies swelling, bruising, color changes. She tried massage. There are no aggravating or alleviating factors. She denies trauma to her neck or to her hand. She has never had symptoms like this before. Pain is not waking her up at night. She has past medical history of diabetes for which she takes metformin. Also history of hypertension, lower extremity peripheral neuropathy hypercholesterolemia, atrial fibrillation. She is a smoker. No alcohol use. No history of carpal tunnel syndrome, kidney disease, GI bleed. LMP: December. She denies possibility of being pregnant. PMD: [facility]   Patient went to the ED for the same last night, had a negative hand x-ray. patient left before being seen  Past Medical History:  Diagnosis Date  . Anxiety   . Atrial fibrillation (HCC)   . Depression   . Diabetes mellitus without complication (HCC)   . Dysrhythmia   . GERD (gastroesophageal reflux disease)   . Hypertension   . Obesity   . PCOS (polycystic ovarian syndrome)   . UTI (lower urinary tract infection)     Past Surgical History:   Procedure Laterality Date  . DILATION AND CURETTAGE OF UTERUS    . GXT - Graded Exercise Tolerance Test  07/22/2016   Exercise time 6 minutes - 7 METs. Maximum heart rate 146 BPM. - No EKG changes. No arrhythmias or chest pain.: Low risk test.  . TRANSTHORACIC ECHOCARDIOGRAM  06/2016   EF 6570% with mild LVH. Normal wall motion and diastolic function. Mild RAE. Otherwise normal.  . WISDOM TOOTH EXTRACTION      Family History  Problem Relation Age of Onset  . Heart failure Father   . Heart attack Father     Social History  Substance Use Topics  . Smoking status: Current Every Day Smoker    Packs/day: 0.50    Years: 21.00    Types: Cigarettes, E-cigarettes  . Smokeless tobacco: Never Used     Comment: vapor  . Alcohol use Yes     Comment: rare    No current facility-administered medications for this encounter.   Current Outpatient Prescriptions:  .  apixaban (ELIQUIS) 5 MG TABS tablet, Take 1 tablet (5 mg total) by mouth 2 (two) times daily as needed., Disp: 42 tablet, Rfl: 0 .  aspirin 325 MG tablet, Take 325 mg by mouth daily., Disp: , Rfl:  .  atorvastatin (LIPITOR) 20 MG tablet, Take 1 tablet (20 mg total) by mouth daily., Disp: , Rfl:  .  Cholecalciferol (VITAMIN D3) 50000 units CAPS, Take 1 tablet by mouth once a week., Disp: , Rfl: 1 .  co-enzyme Q-10 30 MG  capsule, Take 30 mg by mouth daily., Disp: , Rfl:  .  diclofenac (VOLTAREN) 75 MG EC tablet, Take 1 tablet (75 mg total) by mouth 2 (two) times daily. Take with food, Disp: 30 tablet, Rfl: 0 .  diltiazem (CARDIZEM CD) 120 MG 24 hr capsule, Take 1 capsule (120 mg total) by mouth daily., Disp: 90 capsule, Rfl: 3 .  diltiazem (CARDIZEM) 60 MG tablet, Take 1 tablet if needed for palpitations. Can be taken every 6 hours., Disp: 10 tablet, Rfl: 0 .  flecainide (TAMBOCOR) 100 MG tablet, Take 2 tablets (200 mg total) by mouth as needed. For A-fib, Disp: 10 tablet, Rfl: 6 .  glimepiride (AMARYL) 2 MG tablet, Take 2 mg by mouth  daily with breakfast., Disp: , Rfl:  .  lisinopril (PRINIVIL,ZESTRIL) 10 MG tablet, Take 1 tablet (10 mg total) by mouth daily., Disp: 90 tablet, Rfl: 3 .  metFORMIN (GLUCOPHAGE) 1000 MG tablet, Take 1 tablet (1,000 mg total) by mouth 2 (two) times daily with a meal., Disp: , Rfl:  .  methylPREDNISolone (MEDROL DOSEPAK) 4 MG TBPK tablet, follow package directions, Disp: 21 tablet, Rfl: 0  Allergies  Allergen Reactions  . Sulfa Antibiotics Hives     ROS  As noted in HPI.   Physical Exam  BP 116/88   Pulse 60   Temp 98 F (36.7 C)   Resp 18   LMP 07/02/2016 (Approximate)   SpO2 100%   Constitutional: Well developed, well nourished, no acute distress Eyes:  EOMI, conjunctiva normal bilaterally HENT: Normocephalic, atraumatic,mucus membranes moist Respiratory: Normal inspiratory effort Cardiovascular: Normal rate GI: nondistended skin: No rash, skin intact Musculoskeletal: No C-spine tenderness. No trapezial tenderness, muscle spasm. No neck strap muscle tenderness. Patient able to do full range of motion of shoulders without any reproducible symptoms. Elbow and shoulder strength, Grip strength 5/5 and equal bilaterally. 2 point discrimination intact median/radial/ulnar distribution. Sensation and motor is intact in the median/radial/ulnar distribution. Normal appearance of both arms. R distal radius NT , distal ulnar styloid NT, snuffbox NT, carpals NT, metacarpals NT, digits NT, TFCC NT. no pain with supination,  no pain with pronation, no pain with radial / ulnar deviation. Motor intact ability to flex / extend digits of affected hand, Sensation LT to hand normal. CR<2 seconds distally. Tinel  neg. Phalen neg.  [place]in neg.  Shoulder and upper arm NT, Elbow and proximal forearm NT on affected extremity. Neurologic: Alert & oriented x 3, no focal neuro deficits Psychiatric: Speech and behavior appropriate   ED Course   Medications - No data to display  Orders Placed This  Encounter  Procedures  . DG Cervical Spine Complete    Standing Status:   Standing    Number of Occurrences:   1    Order Specific Question:   Reason for Exam (SYMPTOM  OR DIAGNOSIS REQUIRED)    Answer:   numbnness , pain thumb index middle finger    No results found for this or any previous visit (from the past 24 hour(s)). Dg Cervical Spine Complete  Result Date: 09/02/2016 CLINICAL DATA:  Neck pain, question pinched nerve. Right hand and thumb pain with right hand swelling if neck is move a certain way to. This has been ongoing for over a month. EXAM: CERVICAL SPINE - COMPLETE 4+ VIEW COMPARISON:  None. FINDINGS: There is slight straightening and reversal of cervical lordosis. There is disc space narrowing from C3 through C7 with small posterior marginal osteophytes. Bilateral C3-4 neural foraminal encroachment  from osteophytes as well as on the right at C4-5. No prevertebral soft tissue swelling. The atlantodental interval is maintained. The craniocervical relationship appears intact. Uncovertebral joint spurring is identified on the left at C3-4, bilaterally at C4-5 and right at C5-6. Facet joints are maintained bilaterally. No acute fracture noted. No bone destruction is. Lung apices are unremarkable. IMPRESSION: Cervical spondylosis with multilevel degenerative disc disease and osteophytes contributing to bilateral C3-4 and right C4-5 neural foraminal narrowing. Uncovertebral joint osteoarthritis as above described. No acute fracture. Electronically Signed   By: Tollie Ethavid  Kwon M.D.   On: 09/02/2016 18:00   Dg Hand Complete Right  Result Date: 09/02/2016 CLINICAL DATA:  Hand pain EXAM: RIGHT HAND - COMPLETE 3+ VIEW COMPARISON:  02/08/2014 FINDINGS: No fracture or malalignment. Minimal degenerative changes at the [facility]. Possible old tiny erosions involving the base of the second and third distal phalanges. No soft tissue mass. IMPRESSION: No acute osseous abnormality. Electronically Signed    By: Jasmine PangKim  Fujinaga M.D.   On: 09/02/2016 00:20    ED Clinical Impression  Neck pain on right side  Cervical radiculopathy   ED Assessment/Plan  Reviewed imaging independently. Hand x-ray: No acute osseous changes. Minimal degenerative changes of the [facility]. See radiology report for details  C-spine films. Multilevel degenerative disc disease and osteophytes contributing to bilateral C3/C4 and right C4/C5 neural foraminal narrowing. No acute fracture. See radiology reprot for details.   Previous notes reviewed. As noted in history of present illness.  [facility]. Patient has two opiate prescriptions in the past 6 months, 1 for cough syrup in Jan 2018 and the other short course of Norco back in October 2017  Presentation most consistent with neck pain with radicular symptoms, from the degenerative disc disease particularly at C4-C5. In the differential is carpal tunnel syndrome as she does do a lot of repetitive activity. There is not appear to be infection. Doubt cardiac etiology of her symptoms as it has been constant for the past month.  She states that she is not on eliquis at this point in time per her cardiologist's recommendation. She is on aspirin only.  Home with diclofenac, medrol dosepack. Will refer to Dr. Roda ShuttersXu, ortho on call and Dr. Mikal Planeabell, neurosurgery for her to follow up with. Pt may also benefit from PT and advanced imaging.   Discussed labs, imaging, MDM, plan and followup with patient . Discussed sn/sx that should prompt return to the ED. Patient agrees with plan.   Meds ordered this encounter  Medications  . diclofenac (VOLTAREN) 75 MG EC tablet    Sig: Take 1 tablet (75 mg total) by mouth 2 (two) times daily. Take with food    Dispense:  30 tablet    Refill:  0  . methylPREDNISolone (MEDROL DOSEPAK) 4 MG TBPK tablet    Sig: follow package directions    Dispense:  21 tablet    Refill:  0    *This clinic note was created  using Scientist, clinical (histocompatibility and immunogenetics)Dragon dictation software. Therefore, there may be occasional mistakes despite careful proofreading.  ?   Domenick GongAshley Amdrew Oboyle, MD 09/03/16 [phone]

## 2016-09-15 DIAGNOSIS — M5412 Radiculopathy, cervical region: Secondary | ICD-10-CM | POA: Diagnosis not present

## 2016-09-17 ENCOUNTER — Other Ambulatory Visit: Payer: Self-pay | Admitting: Neurosurgery

## 2016-09-17 DIAGNOSIS — M5412 Radiculopathy, cervical region: Secondary | ICD-10-CM

## 2016-09-22 ENCOUNTER — Ambulatory Visit (HOSPITAL_COMMUNITY)
Admission: EM | Admit: 2016-09-22 | Discharge: 2016-09-22 | Disposition: A | Discharge status: Home / Self Care | Payer: 59 | Attending: Family Medicine | Admitting: Family Medicine

## 2016-09-22 ENCOUNTER — Encounter (HOSPITAL_COMMUNITY): Payer: Self-pay | Admitting: Emergency Medicine

## 2016-09-22 DIAGNOSIS — B370 Candidal stomatitis: Secondary | ICD-10-CM | POA: Diagnosis not present

## 2016-09-22 MED ORDER — CLOTRIMAZOLE 10 MG MT TROC: 10.0000 mg | Freq: Every day | OROMUCOSAL | 0 refills | Status: AC

## 2016-09-22 NOTE — ED Provider Notes (Signed)
CSN: [id number]     Arrival date & time 09/22/16  1754 History   None    Chief Complaint  Patient presents with  . Dental Pain   (Consider location/radiation/quality/duration/timing/severity/associated sxs/prior Treatment) 41 year old female presents to clinic with a one-week history of tongue pain. States she's had brown coating on her tongue, along with redness towards the tip. She denies recent history of antibiotics, however she has recently completed a course of oral prednisone. She is unaware of any known immunological problems. He has tried over-the-counter therapies, however has had minimal relief.   The history is provided by the patient.  Dental Pain    Past Medical History:  Diagnosis Date  . Anxiety   . Atrial fibrillation (HCC)   . Depression   . Diabetes mellitus without complication (HCC)   . Dysrhythmia   . GERD (gastroesophageal reflux disease)   . Hypertension   . Obesity   . PCOS (polycystic ovarian syndrome)   . UTI (lower urinary tract infection)    Past Surgical History:  Procedure Laterality Date  . DILATION AND CURETTAGE OF UTERUS    . GXT - Graded Exercise Tolerance Test  07/22/2016   Exercise time 6 minutes - 7 METs. Maximum heart rate 146 BPM. - No EKG changes. No arrhythmias or chest pain.: Low risk test.  . TRANSTHORACIC ECHOCARDIOGRAM  06/2016   EF 6570% with mild LVH. Normal wall motion and diastolic function. Mild RAE. Otherwise normal.  . WISDOM TOOTH EXTRACTION     Family History  Problem Relation Age of Onset  . Heart failure Father   . Heart attack Father    Social History  Substance Use Topics  . Smoking status: Current Every Day Smoker    Packs/day: 0.50    Years: 21.00    Types: Cigarettes, E-cigarettes  . Smokeless tobacco: Never Used     Comment: vapor  . Alcohol use Yes     Comment: rare   OB History    Gravida Para Term Preterm AB Living   2       2     SAB TAB Ectopic Multiple Live Births                 Review of  Systems  Reason unable to perform ROS: As covered by history of present illness.  All other systems reviewed and are negative.   Allergies  Sulfa antibiotics  Home Medications   Prior to Admission medications   Medication Sig Start Date End Date Taking? Authorizing Provider  apixaban (ELIQUIS) 5 MG TABS tablet Take 1 tablet (5 mg total) by mouth 2 (two) times daily as needed. 08/18/16   Marykay Lexavid W Harding, MD  aspirin 325 MG tablet Take 325 mg by mouth daily.    Historical Provider, MD  atorvastatin (LIPITOR) 20 MG tablet Take 1 tablet (20 mg total) by mouth daily. 07/13/14   Thermon LeylandLaura A Davis, NP  Cholecalciferol (VITAMIN D3) 50000 units CAPS Take 1 tablet by mouth once a week. 07/10/16   Historical Provider, MD  clotrimazole (MYCELEX) 10 MG troche Take 1 tablet (10 mg total) by mouth 5 (five) times daily. 09/22/16 09/27/16  Dorena BodoLawrence Kensington Duerst, NP  co-enzyme Q-10 30 MG capsule Take 30 mg by mouth daily.    Historical Provider, MD  diclofenac (VOLTAREN) 75 MG EC tablet Take 1 tablet (75 mg total) by mouth 2 (two) times daily. Take with food 09/02/16   Domenick GongAshley Mortenson, MD  diltiazem (CARDIZEM CD) 120 MG 24 hr  capsule Take 1 capsule (120 mg total) by mouth daily. 06/23/16   Marykay Lex, MD  diltiazem (CARDIZEM) 60 MG tablet Take 1 tablet if needed for palpitations. Can be taken every 6 hours. 06/05/15   [name], MD  flecainide (TAMBOCOR) 100 MG tablet Take 2 tablets (200 mg total) by mouth as needed. For A-fib 08/18/16   Marykay Lex, MD  glimepiride (AMARYL) 2 MG tablet Take 2 mg by mouth daily with breakfast.    Historical Provider, MD  lisinopril (PRINIVIL,ZESTRIL) 10 MG tablet Take 1 tablet (10 mg total) by mouth daily. 06/23/16   Marykay Lex, MD  metFORMIN (GLUCOPHAGE) 1000 MG tablet Take 1 tablet (1,000 mg total) by mouth 2 (two) times daily with a meal. 07/13/14   Thermon Leyland, NP  methylPREDNISolone (MEDROL DOSEPAK) 4 MG TBPK tablet follow package directions 09/02/16   Domenick Gong, MD   Meds Ordered and Administered this Visit  Medications - No data to display  BP 131/87 (BP Location: Left Arm)   Pulse 90   Temp 98.3 F (36.8 C) (Oral)   Resp 18   SpO2 96%  No data found.   Physical Exam  Constitutional: She is oriented to person, place, and time. She appears well-developed and well-nourished. No distress.  HENT:  Head: Normocephalic and atraumatic.  Right Ear: External ear normal.  Left Ear: External ear normal.  Mouth/Throat: Uvula is midline. Oral lesions present. Tonsils are 2+ on the right. Tonsils are 2+ on the left. No tonsillar exudate.  Around plaques coating the posterior surface of the tongue, anterior surface appearing red and injected.  Neurological: She is alert and oriented to person, place, and time.  Skin: Skin is warm and dry. Capillary refill takes less than 2 seconds. She is not diaphoretic.  Psychiatric: She has a normal mood and affect.  Nursing note and vitals reviewed.   Urgent Care Course     Procedures (including critical care time)  Labs Review Labs Reviewed - No data to display  Imaging Review No results found.    MDM   1. Thrush, oral    You are being treated for thrush, I have prescribed mycelex troche, use one 5 times a day for 5 days. Should your symptoms persist, follow up with your primary care provider or return to clinic as needed.      Dorena Bodo, NP 09/22/16 2046

## 2016-09-22 NOTE — Discharge Instructions (Addendum)
You are being treated for thrush, I have prescribed mycelex troche, use one 5 times a day for 5 days. Should your symptoms persist, follow up with your primary care provider or return to clinic as needed.

## 2016-09-22 NOTE — ED Triage Notes (Signed)
Pt stated a week ago her tongue started to become painful followed with tiny bumps on the back of her tongue.

## 2016-09-29 ENCOUNTER — Ambulatory Visit
Admission: RE | Admit: 2016-09-29 | Discharge: 2016-09-29 | Disposition: A | Discharge status: Home / Self Care | Payer: 59 | Source: Ambulatory Visit | Attending: Neurosurgery | Admitting: Neurosurgery

## 2016-09-29 DIAGNOSIS — M5412 Radiculopathy, cervical region: Secondary | ICD-10-CM

## 2016-09-29 DIAGNOSIS — M4802 Spinal stenosis, cervical region: Secondary | ICD-10-CM | POA: Diagnosis not present

## 2016-10-08 DIAGNOSIS — G5603 Carpal tunnel syndrome, bilateral upper limbs: Secondary | ICD-10-CM | POA: Diagnosis not present

## 2016-10-08 DIAGNOSIS — M5412 Radiculopathy, cervical region: Secondary | ICD-10-CM | POA: Diagnosis not present

## 2016-10-13 DIAGNOSIS — M4802 Spinal stenosis, cervical region: Secondary | ICD-10-CM | POA: Diagnosis not present

## 2016-10-13 DIAGNOSIS — I10 Essential (primary) hypertension: Secondary | ICD-10-CM | POA: Diagnosis not present

## 2016-10-20 ENCOUNTER — Ambulatory Visit (INDEPENDENT_AMBULATORY_CARE_PROVIDER_SITE_OTHER): Payer: Self-pay | Admitting: Orthopaedic Surgery

## 2016-11-02 DIAGNOSIS — M4802 Spinal stenosis, cervical region: Secondary | ICD-10-CM | POA: Diagnosis not present

## 2016-11-09 DIAGNOSIS — M4712 Other spondylosis with myelopathy, cervical region: Secondary | ICD-10-CM | POA: Diagnosis not present

## 2016-11-09 DIAGNOSIS — M4722 Other spondylosis with radiculopathy, cervical region: Secondary | ICD-10-CM | POA: Diagnosis not present

## 2016-11-09 DIAGNOSIS — M4802 Spinal stenosis, cervical region: Secondary | ICD-10-CM | POA: Diagnosis not present

## 2016-12-09 DIAGNOSIS — M4802 Spinal stenosis, cervical region: Secondary | ICD-10-CM | POA: Diagnosis not present

## 2017-01-06 ENCOUNTER — Ambulatory Visit (HOSPITAL_COMMUNITY)
Admission: EM | Admit: 2017-01-06 | Discharge: 2017-01-06 | Disposition: A | Discharge status: Home / Self Care | Payer: 59 | Attending: Internal Medicine | Admitting: Internal Medicine

## 2017-01-06 ENCOUNTER — Encounter (HOSPITAL_COMMUNITY): Payer: Self-pay | Admitting: Emergency Medicine

## 2017-01-06 DIAGNOSIS — S39011A Strain of muscle, fascia and tendon of abdomen, initial encounter: Secondary | ICD-10-CM

## 2017-01-06 LAB — POCT URINALYSIS DIP (DEVICE)
Bilirubin Urine: NEGATIVE
Glucose, UA: NEGATIVE mg/dL
Hgb urine dipstick: NEGATIVE
Ketones, ur: NEGATIVE mg/dL
Leukocytes, UA: NEGATIVE
Nitrite: NEGATIVE
Protein, ur: 100 mg/dL — AB
Specific Gravity, Urine: 1.02 (ref 1.005–1.030)
Urobilinogen, UA: 0.2 mg/dL (ref 0.0–1.0)
pH: 6 (ref 5.0–8.0)

## 2017-01-06 LAB — POCT PREGNANCY, URINE: Preg Test, Ur: NEGATIVE

## 2017-01-06 MED ORDER — DICLOFENAC SODIUM 75 MG PO TBEC: 75.0000 mg | DELAYED_RELEASE_TABLET | Freq: Two times a day (BID) | ORAL | 0 refills | Status: DC

## 2017-01-06 MED ORDER — CYCLOBENZAPRINE HCL 10 MG PO TABS: 10.0000 mg | ORAL_TABLET | Freq: Three times a day (TID) | ORAL | 0 refills | Status: DC | PRN

## 2017-01-06 NOTE — ED Triage Notes (Signed)
Pt reports a nagging, cramping feeling in her left flank for about one week.  She reports it will radiate to the front of her abdomen at times.  She denies any urinary or GI issues.

## 2017-01-06 NOTE — Medical Student Note (Signed)
[facility] Insurance account manager Note For educational purposes for Medical, PA and NP students only and not part of the legal medical record.   CSN: [id number] Arrival date & time: 01/06/17  1139     History   Chief Complaint Chief Complaint  Patient presents with  . Flank Pain    left    HPI Julia Dickerson is a 41 y.o. female.  Pt is a 41 year old female that presents with left rib, left flank pain. sts that is started about 2 days ago. Denies injury, strenuous exercise or moving. Denies any N,V,D. Denies urinary symptoms. denies fever chills, coughing,, congestion. Denies constipation.    The history is provided by the patient.  Flank Pain  This is a new problem. The current episode started 2 days ago. The problem occurs constantly. The problem has not changed since onset.Pertinent negatives include no chest pain, no abdominal pain, no headaches and no shortness of breath. Nothing aggravates the symptoms. Nothing relieves the symptoms. She has tried nothing for the symptoms.    Past Medical History:  Diagnosis Date  . Anxiety   . Atrial fibrillation (HCC)   . Depression   . Diabetes mellitus without complication (HCC)   . Dysrhythmia   . GERD (gastroesophageal reflux disease)   . Hypertension   . Obesity   . PCOS (polycystic ovarian syndrome)   . UTI (lower urinary tract infection)     Patient Active Problem List   Diagnosis Date Noted  . Abnormal EKG 06/24/2016  . Paroxysmal atrial fibrillation (HCC): CHA2DS2Vasc ~3 06/23/2016  . Essential hypertension 06/23/2016  . Major depression, recurrent (HCC) 07/10/2014    Past Surgical History:  Procedure Laterality Date  . DILATION AND CURETTAGE OF UTERUS    . GXT - Graded Exercise Tolerance Test  07/22/2016   Exercise time 6 minutes - 7 METs. Maximum heart rate 146 BPM. - No EKG changes. No arrhythmias or chest pain.: Low risk test.  . TRANSTHORACIC ECHOCARDIOGRAM  06/2016   EF 6570% with mild LVH. Normal  wall motion and diastolic function. Mild RAE. Otherwise normal.  . WISDOM TOOTH EXTRACTION      OB History    Gravida Para Term Preterm AB Living   2       2     SAB TAB Ectopic Multiple Live Births                   Home Medications    Prior to Admission medications   Medication Sig Start Date End Date Taking? Authorizing Provider  atorvastatin (LIPITOR) 20 MG tablet Take 1 tablet (20 mg total) by mouth daily. 07/13/14  Yes Thermon Leyland, NP  diltiazem (CARDIZEM CD) 120 MG 24 hr capsule Take 1 capsule (120 mg total) by mouth daily. 06/23/16  Yes Marykay Lex, MD  glimepiride (AMARYL) 2 MG tablet Take 2 mg by mouth daily with breakfast.   Yes [provider]  lisinopril (PRINIVIL,ZESTRIL) 10 MG tablet Take 1 tablet (10 mg total) by mouth daily. 06/23/16  Yes Marykay Lex, MD  metFORMIN (GLUCOPHAGE) 1000 MG tablet Take 1 tablet (1,000 mg total) by mouth 2 (two) times daily with a meal. 07/13/14  Yes Thermon Leyland, NP  apixaban (ELIQUIS) 5 MG TABS tablet Take 1 tablet (5 mg total) by mouth 2 (two) times daily as needed. 08/18/16   Marykay Lex, MD  aspirin 325 MG tablet Take 325 mg by mouth daily.  [provider]  Cholecalciferol (VITAMIN D3) 50000 units CAPS Take 1 tablet by mouth once a week. 07/10/16   [provider]  co-enzyme Q-10 30 MG capsule Take 30 mg by mouth daily.    [provider]  cyclobenzaprine (FLEXERIL) 10 MG tablet Take 1 tablet (10 mg total) by mouth 3 (three) times daily as needed for muscle spasms. 01/06/17   Deatra Canterxford, William J, FNP  diclofenac (VOLTAREN) 75 MG EC tablet Take 1 tablet (75 mg total) by mouth 2 (two) times daily. Take with food 01/06/17   Deatra Canterxford, William J, FNP  diltiazem (CARDIZEM) 60 MG tablet Take 1 tablet if needed for palpitations. Can be taken every 6 hours. 06/05/15   Gilda CreasePollina, Christopher J, MD  flecainide (TAMBOCOR) 100 MG tablet Take 2 tablets (200 mg total) by mouth as needed. For A-fib  08/18/16   Marykay LexHarding, David W, MD  methylPREDNISolone (MEDROL DOSEPAK) 4 MG TBPK tablet follow package directions 09/02/16   Domenick GongMortenson, Ashley, MD    Family History Family History  Problem Relation Age of Onset  . Heart failure Father   . Heart attack Father     Social History Social History  Substance Use Topics  . Smoking status: Current Every Day Smoker    Packs/day: 0.50    Years: 21.00    Types: Cigarettes, E-cigarettes  . Smokeless tobacco: Never Used     Comment: vapor  . Alcohol use Yes     Comment: rare     Allergies   Sulfa antibiotics   Review of Systems Review of Systems  Constitutional: Negative for appetite change, chills, diaphoresis, fatigue and fever.  HENT: Negative.   Eyes: Negative.   Respiratory: Negative for cough, chest tightness and shortness of breath.   Cardiovascular: Negative for chest pain.  Gastrointestinal: Negative for abdominal pain, constipation, diarrhea, nausea and vomiting.  Endocrine: Negative.   Genitourinary: Positive for flank pain. Negative for difficulty urinating, dysuria, frequency, hematuria, menstrual problem, pelvic pain, urgency, vaginal bleeding and vaginal discharge.  Musculoskeletal: Positive for myalgias.  Allergic/Immunologic: Negative.   Neurological: Negative.  Negative for headaches.  Hematological: Negative.   Psychiatric/Behavioral: Negative.      Physical Exam Updated Vital Signs BP 116/63 (BP Location: Right Arm)   Pulse 99   Temp 98.7 F (37.1 C) (Oral)   SpO2 100%   Physical Exam  Constitutional: She is oriented to person, place, and time. She appears well-developed and well-nourished. No distress.  HENT:  Head: Normocephalic and atraumatic.  Eyes: Conjunctivae and EOM are normal. Pupils are equal, round, and reactive to light.  Neck: Normal range of motion. Neck supple.  Cardiovascular: Normal rate and regular rhythm.   Pulmonary/Chest: Effort normal and breath sounds normal.  Abdominal: Soft.  Bowel sounds are normal. She exhibits no distension and no mass. There is no tenderness. There is no rebound and no guarding. No hernia.  Musculoskeletal: Normal range of motion.  Tenderness to left rib area with palpation.   Neurological: She is alert and oriented to person, place, and time.  Skin: Skin is warm and dry. She is not diaphoretic.  Psychiatric: She has a normal mood and affect.     ED Treatments / Results  Labs (all labs ordered are listed, but only abnormal results are displayed) Labs Reviewed  POCT URINALYSIS DIP (DEVICE) - Abnormal; Notable for the following:       Result Value   Protein, ur 100 (*)    All other components within normal limits  POCT PREGNANCY, URINE    EKG  EKG Interpretation None       Radiology No results found.  Procedures Procedures (including critical care time)  Medications Ordered in ED Medications - No data to display   Initial Impression / Assessment and Plan / ED Course  I have reviewed the triage vital signs and the nursing notes.  Pertinent labs & imaging results that were available during my care of the patient were reviewed by me and considered in my medical decision making (see chart for details).       Final Clinical Impressions(s) / ED Diagnoses   Final diagnoses:  Strain of abdominal muscle, initial encounter    New Prescriptions Current Discharge Medication List

## 2017-01-06 NOTE — ED Provider Notes (Signed)
CSN: [id number]     Arrival date & time 01/06/17  1139 History   None    Chief Complaint  Patient presents with  . Flank Pain    left   (Consider location/radiation/quality/duration/timing/severity/associated sxs/prior Treatment) Patient c/o left sided abdominal pain   The history is provided by the patient.  Flank Pain  This is a new problem. The problem occurs constantly. The problem has not changed since onset.Associated symptoms include abdominal pain. Nothing aggravates the symptoms. Nothing relieves the symptoms. She has tried nothing for the symptoms.    Past Medical History:  Diagnosis Date  . Anxiety   . Atrial fibrillation (HCC)   . Depression   . Diabetes mellitus without complication (HCC)   . Dysrhythmia   . GERD (gastroesophageal reflux disease)   . Hypertension   . Obesity   . PCOS (polycystic ovarian syndrome)   . UTI (lower urinary tract infection)    Past Surgical History:  Procedure Laterality Date  . DILATION AND CURETTAGE OF UTERUS    . GXT - Graded Exercise Tolerance Test  07/22/2016   Exercise time 6 minutes - 7 METs. Maximum heart rate 146 BPM. - No EKG changes. No arrhythmias or chest pain.: Low risk test.  . TRANSTHORACIC ECHOCARDIOGRAM  06/2016   EF 6570% with mild LVH. Normal wall motion and diastolic function. Mild RAE. Otherwise normal.  . WISDOM TOOTH EXTRACTION     Family History  Problem Relation Age of Onset  . Heart failure Father   . Heart attack Father    Social History  Substance Use Topics  . Smoking status: Current Every Day Smoker    Packs/day: 0.50    Years: 21.00    Types: Cigarettes, E-cigarettes  . Smokeless tobacco: Never Used     Comment: vapor  . Alcohol use Yes     Comment: rare   OB History    Gravida Para Term Preterm AB Living   2       2     SAB TAB Ectopic Multiple Live Births                 Review of Systems  Constitutional: Negative.   HENT: Negative.   Eyes: Negative.   Respiratory: Negative.    Cardiovascular: Negative.   Gastrointestinal: Positive for abdominal pain.  Endocrine: Negative.   Genitourinary: Positive for flank pain.  Allergic/Immunologic: Negative.   Neurological: Negative.   Hematological: Negative.   Psychiatric/Behavioral: Negative.     Allergies  Sulfa antibiotics  Home Medications   Prior to Admission medications   Medication Sig Start Date End Date Taking? Authorizing Provider  atorvastatin (LIPITOR) 20 MG tablet Take 1 tablet (20 mg total) by mouth daily. 07/13/14  Yes Thermon Leylandavis, Laura A, NP  diltiazem (CARDIZEM CD) 120 MG 24 hr capsule Take 1 capsule (120 mg total) by mouth daily. 06/23/16  Yes Marykay LexHarding, David W, MD  glimepiride (AMARYL) 2 MG tablet Take 2 mg by mouth daily with breakfast.   Yes [provider]  lisinopril (PRINIVIL,ZESTRIL) 10 MG tablet Take 1 tablet (10 mg total) by mouth daily. 06/23/16  Yes Marykay LexHarding, David W, MD  metFORMIN (GLUCOPHAGE) 1000 MG tablet Take 1 tablet (1,000 mg total) by mouth 2 (two) times daily with a meal. 07/13/14  Yes Thermon Leylandavis, Laura A, NP  apixaban (ELIQUIS) 5 MG TABS tablet Take 1 tablet (5 mg total) by mouth 2 (two) times daily as needed. 08/18/16   Marykay LexHarding, David W, MD  aspirin 325  MG tablet Take 325 mg by mouth daily.    [provider]  Cholecalciferol (VITAMIN D3) 50000 units CAPS Take 1 tablet by mouth once a week. 07/10/16   [provider]  co-enzyme Q-10 30 MG capsule Take 30 mg by mouth daily.    [provider]  cyclobenzaprine (FLEXERIL) 10 MG tablet Take 1 tablet (10 mg total) by mouth 3 (three) times daily as needed for muscle spasms. 01/06/17   Deatra Canter, FNP  diclofenac (VOLTAREN) 75 MG EC tablet Take 1 tablet (75 mg total) by mouth 2 (two) times daily. Take with food 01/06/17   Deatra Canter, FNP  diltiazem (CARDIZEM) 60 MG tablet Take 1 tablet if needed for palpitations. Can be taken every 6 hours. 06/05/15   [name], MD  flecainide (TAMBOCOR)  100 MG tablet Take 2 tablets (200 mg total) by mouth as needed. For A-fib 08/18/16   Marykay Lex, MD  methylPREDNISolone (MEDROL DOSEPAK) 4 MG TBPK tablet follow package directions 09/02/16   Domenick Gong, MD   Meds Ordered and Administered this Visit  Medications - No data to display  BP 116/63 (BP Location: Right Arm)   Pulse 99   Temp 98.7 F (37.1 C) (Oral)   SpO2 100%  No data found.   Physical Exam  Constitutional: She appears well-developed and well-nourished.  HENT:  Head: Normocephalic and atraumatic.  Eyes: Conjunctivae and EOM are normal. Pupils are equal, round, and reactive to light.  Neck: Normal range of motion. Neck supple.  Cardiovascular: Normal rate, regular rhythm and normal heart sounds.   Pulmonary/Chest: Effort normal.  Abdominal: Soft. There is tenderness.  Left abdominal muscle tenderness.  Nursing note and vitals reviewed.   Urgent Care Course     Procedures (including critical care time)  Labs Review Labs Reviewed  POCT URINALYSIS DIP (DEVICE) - Abnormal; Notable for the following:       Result Value   Protein, ur 100 (*)    All other components within normal limits  POCT PREGNANCY, URINE    Imaging Review No results found.   Visual Acuity Review  Right Eye Distance:   Left Eye Distance:   Bilateral Distance:    Right Eye Near:   Left Eye Near:    Bilateral Near:         MDM   1. Strain of abdominal muscle, initial encounter    Flexeril 10mg  one po tid prn #30 Voltaren 75mg  one po bid #30     Deatra Canter, FNP 01/06/17 1307

## 2017-01-07 DIAGNOSIS — E119 Type 2 diabetes mellitus without complications: Secondary | ICD-10-CM | POA: Diagnosis not present

## 2017-01-07 DIAGNOSIS — I10 Essential (primary) hypertension: Secondary | ICD-10-CM | POA: Diagnosis not present

## 2017-01-13 DIAGNOSIS — M4802 Spinal stenosis, cervical region: Secondary | ICD-10-CM | POA: Diagnosis not present

## 2017-03-23 DIAGNOSIS — M4802 Spinal stenosis, cervical region: Secondary | ICD-10-CM | POA: Diagnosis not present

## 2017-03-23 DIAGNOSIS — I10 Essential (primary) hypertension: Secondary | ICD-10-CM | POA: Diagnosis not present

## 2017-05-16 ENCOUNTER — Encounter (HOSPITAL_COMMUNITY): Payer: Self-pay | Admitting: *Deleted

## 2017-05-16 ENCOUNTER — Emergency Department (HOSPITAL_COMMUNITY): Payer: 59

## 2017-05-16 ENCOUNTER — Observation Stay (HOSPITAL_COMMUNITY)
Admission: EM | Admit: 2017-05-16 | Discharge: 2017-05-17 | Disposition: A | Discharge status: Home / Self Care | Payer: 59 | Attending: Cardiology | Admitting: Cardiology

## 2017-05-16 DIAGNOSIS — F17290 Nicotine dependence, other tobacco product, uncomplicated: Secondary | ICD-10-CM | POA: Insufficient documentation

## 2017-05-16 DIAGNOSIS — Z7901 Long term (current) use of anticoagulants: Secondary | ICD-10-CM | POA: Diagnosis not present

## 2017-05-16 DIAGNOSIS — E119 Type 2 diabetes mellitus without complications: Secondary | ICD-10-CM | POA: Insufficient documentation

## 2017-05-16 DIAGNOSIS — I10 Essential (primary) hypertension: Secondary | ICD-10-CM | POA: Diagnosis not present

## 2017-05-16 DIAGNOSIS — Z7982 Long term (current) use of aspirin: Secondary | ICD-10-CM | POA: Diagnosis not present

## 2017-05-16 DIAGNOSIS — Z7984 Long term (current) use of oral hypoglycemic drugs: Secondary | ICD-10-CM | POA: Diagnosis not present

## 2017-05-16 DIAGNOSIS — R0602 Shortness of breath: Secondary | ICD-10-CM | POA: Diagnosis not present

## 2017-05-16 DIAGNOSIS — I4891 Unspecified atrial fibrillation: Secondary | ICD-10-CM | POA: Diagnosis not present

## 2017-05-16 DIAGNOSIS — R002 Palpitations: Secondary | ICD-10-CM | POA: Diagnosis present

## 2017-05-16 DIAGNOSIS — F17210 Nicotine dependence, cigarettes, uncomplicated: Secondary | ICD-10-CM | POA: Insufficient documentation

## 2017-05-16 DIAGNOSIS — Z79899 Other long term (current) drug therapy: Secondary | ICD-10-CM | POA: Insufficient documentation

## 2017-05-16 DIAGNOSIS — I480 Paroxysmal atrial fibrillation: Principal | ICD-10-CM | POA: Insufficient documentation

## 2017-05-16 LAB — BASIC METABOLIC PANEL
Anion gap: 15 (ref 5–15)
BUN: 11 mg/dL (ref 6–20)
CO2: 20 mmol/L — ABNORMAL LOW (ref 22–32)
Calcium: 9.7 mg/dL (ref 8.9–10.3)
Chloride: 101 mmol/L (ref 101–111)
Creatinine, Ser: 0.84 mg/dL (ref 0.44–1.00)
GFR calc Af Amer: >60 mL/min (ref >60)
GFR calc non Af Amer: >60 mL/min (ref >60)
Glucose, Bld: 291 mg/dL — ABNORMAL HIGH (ref 65–99)
Potassium: 4.5 mmol/L (ref 3.5–5.1)
Sodium: 136 mmol/L (ref 135–145)

## 2017-05-16 LAB — CBC
HCT: 48.6 % — ABNORMAL HIGH (ref 36.0–46.0)
Hemoglobin: 16.7 g/dL — ABNORMAL HIGH (ref 12.0–15.0)
MCH: 30.5 pg (ref 26.0–34.0)
MCHC: 34.4 g/dL (ref 30.0–36.0)
MCV: 88.8 fL (ref 78.0–100.0)
Platelets: 363 10*3/uL (ref 150–400)
RBC: 5.47 MIL/uL — ABNORMAL HIGH (ref 3.87–5.11)
RDW: 13.5 % (ref 11.5–15.5)
WBC: 16.4 10*3/uL — ABNORMAL HIGH (ref 4.0–10.5)

## 2017-05-16 LAB — I-STAT TROPONIN, ED: Troponin i, poc: 0.01 ng/mL (ref 0.00–0.08)

## 2017-05-16 LAB — BRAIN NATRIURETIC PEPTIDE: B Natriuretic Peptide: 102.3 pg/mL — ABNORMAL HIGH (ref 0.0–100.0)

## 2017-05-16 LAB — D-DIMER, QUANTITATIVE (NOT AT ARMC): D-Dimer, Quant: 0.38 ug/mL-FEU (ref 0.00–0.50)

## 2017-05-16 MED ORDER — SODIUM CHLORIDE 0.9 % IV BOLUS (SEPSIS)
500.0000 mL | Freq: Once | INTRAVENOUS | Status: AC
  Administered 2017-05-16: 500 mL via INTRAVENOUS

## 2017-05-16 MED ORDER — DILTIAZEM HCL 25 MG/5ML IV SOLN
10.0000 mg | Freq: Once | INTRAVENOUS | Status: AC
  Administered 2017-05-16: 10 mg via INTRAVENOUS
  Filled 2017-05-16: qty 5

## 2017-05-16 MED ORDER — DILTIAZEM HCL 100 MG IV SOLR
5.0000 mg/h | INTRAVENOUS | Status: DC
  Administered 2017-05-16: 5 mg/h via INTRAVENOUS
  Filled 2017-05-16 (×3): qty 100

## 2017-05-16 MED ORDER — APIXABAN 5 MG PO TABS
5.0000 mg | ORAL_TABLET | Freq: Two times a day (BID) | ORAL | Status: DC
  Administered 2017-05-16: 5 mg via ORAL
  Filled 2017-05-16: qty 1

## 2017-05-16 NOTE — ED Notes (Signed)
Pt ambulated to the restroom without difficulty. Gait steady and even.  

## 2017-05-16 NOTE — ED Provider Notes (Signed)
[facility] EMERGENCY DEPARTMENT Provider Note   CSN: [id number] Arrival date & time: 05/16/17  1157     History   Chief Complaint Chief Complaint  Patient presents with  . Palpitations  . Shortness of Breath    HPI Marja Adderley is a 41 y.o. female.  HPI   41 year old female with PMH of Afib, HTN, T2DM, obesity, and anxiety/depression presents for palpitations and SOB. Woke up with palpitations around 5 am this morning. Took her usual long acting Diltiazem this morning as well as Flecainide without improvement in symptoms. At presentation, patient in A-fib with RVR.   She reports that she has had SOB with any type of physical exertion (i.e. Showering, walking from couch to bathroom, etc). This has been ongoing for a couple days. She denies chest pain. No cough, hemoptysis, or lower extremity edema. She denies history of CHF. Takes Eliquis for anticoagulation.   Past Medical History:  Diagnosis Date  . Anxiety   . Atrial fibrillation (HCC)   . Atrial fibrillation (HCC)   . Depression   . Diabetes mellitus without complication (HCC)   . Dysrhythmia   . GERD (gastroesophageal reflux disease)   . Hypertension   . Obesity   . PCOS (polycystic ovarian syndrome)   . UTI (lower urinary tract infection)     Patient Active Problem List   Diagnosis Date Noted  . Abnormal EKG 06/24/2016  . Paroxysmal atrial fibrillation (HCC): CHA2DS2Vasc ~3 06/23/2016  . Essential hypertension 06/23/2016  . Major depression, recurrent (HCC) 07/10/2014    Past Surgical History:  Procedure Laterality Date  . DILATION AND CURETTAGE OF UTERUS    . GXT - Graded Exercise Tolerance Test  07/22/2016   Exercise time 6 minutes - 7 METs. Maximum heart rate 146 BPM. - No EKG changes. No arrhythmias or chest pain.: Low risk test.  . TRANSTHORACIC ECHOCARDIOGRAM  06/2016   EF 6570% with mild LVH. Normal wall motion and diastolic function. Mild RAE. Otherwise normal.  . WISDOM TOOTH  EXTRACTION      OB History    Gravida Para Term Preterm AB Living   2       2     SAB TAB Ectopic Multiple Live Births                   Home Medications    Prior to Admission medications   Medication Sig Start Date End Date Taking? Authorizing Provider  apixaban (ELIQUIS) 5 MG TABS tablet Take 1 tablet (5 mg total) by mouth 2 (two) times daily as needed. 08/18/16  Yes Marykay Lex, MD  atorvastatin (LIPITOR) 20 MG tablet Take 1 tablet (20 mg total) by mouth daily. 07/13/14  Yes Thermon Leyland, NP  Cholecalciferol (VITAMIN D3) 50000 units CAPS Take 1 tablet by mouth once a week. 07/10/16  Yes [provider]  cyclobenzaprine (FLEXERIL) 10 MG tablet Take 1 tablet (10 mg total) by mouth 3 (three) times daily as needed for muscle spasms. 01/06/17  Yes Deatra Canter, FNP  diltiazem (CARDIZEM CD) 120 MG 24 hr capsule Take 1 capsule (120 mg total) by mouth daily. 06/23/16  Yes Marykay Lex, MD  diltiazem (CARDIZEM) 60 MG tablet Take 1 tablet if needed for palpitations. Can be taken every 6 hours. 06/05/15  Yes Pollina, Canary Brim, MD  flecainide (TAMBOCOR) 100 MG tablet Take 2 tablets (200 mg total) by mouth as needed. For A-fib 08/18/16  Yes Marykay Lex,  MD  glimepiride (AMARYL) 2 MG tablet Take 2 mg by mouth 2 (two) times daily.    Yes [provider]  lisinopril (PRINIVIL,ZESTRIL) 10 MG tablet Take 1 tablet (10 mg total) by mouth daily. 06/23/16  Yes Marykay Lex, MD  metFORMIN (GLUCOPHAGE) 1000 MG tablet Take 1 tablet (1,000 mg total) by mouth 2 (two) times daily with a meal. 07/13/14  Yes Thermon Leyland, NP  aspirin 325 MG tablet Take 325 mg by mouth daily.    [provider]  co-enzyme Q-10 30 MG capsule Take 30 mg by mouth daily.    [provider]  diclofenac (VOLTAREN) 75 MG EC tablet Take 1 tablet (75 mg total) by mouth 2 (two) times daily. Take with food Patient not taking: Reported on 05/16/2017 01/06/17   Deatra Canter,  FNP  methylPREDNISolone (MEDROL DOSEPAK) 4 MG TBPK tablet follow package directions Patient not taking: Reported on 05/16/2017 09/02/16   Domenick Gong, MD    Family History Family History  Problem Relation Age of Onset  . Heart failure Father   . Heart attack Father     Social History Social History  Substance Use Topics  . Smoking status: Current Every Day Smoker    Packs/day: 0.50    Years: 21.00    Types: Cigarettes, E-cigarettes  . Smokeless tobacco: Never Used     Comment: vapor  . Alcohol use Yes     Comment: rare     Allergies   Sulfa antibiotics   Review of Systems Review of Systems  Constitutional: Negative for chills, fatigue and fever.  HENT: Negative for congestion.   Respiratory: Negative for cough and shortness of breath.   Cardiovascular: Positive for palpitations. Negative for chest pain and leg swelling.  Gastrointestinal: Negative for abdominal pain.  Neurological: Negative for syncope and headaches.  All other systems reviewed and are negative.    Physical Exam Updated Vital Signs BP 122/90   Pulse (!) 137   Temp 98.1 F (36.7 C) (Oral)   Resp 16   Ht 5\' 8"  (1.727 m)   Wt 117.9 kg (260 lb)   SpO2 98%   BMI 39.53 kg/m   Physical Exam  Constitutional: She is oriented to person, place, and time. She appears well-developed and well-nourished.  Uncomfortable but non-toxic.   HENT:  Head: Normocephalic and atraumatic.  Mouth/Throat: Oropharynx is clear and moist.  Eyes: Pupils are equal, round, and reactive to light. Conjunctivae and EOM are normal.  Neck: Normal range of motion. Neck supple.  Cardiovascular: Intact distal pulses.   Irregularly irregular. Tachycardic.   Pulmonary/Chest: Effort normal and breath sounds normal. No respiratory distress. She has no wheezes. She has no rales.  Abdominal: Soft. Bowel sounds are normal. She exhibits no distension. There is no tenderness.  Musculoskeletal: Normal range of motion. She exhibits  no deformity.  Neurological: She is alert and oriented to person, place, and time. No sensory deficit. She exhibits normal muscle tone.  Skin: Skin is warm and dry.  Psychiatric: She has a normal mood and affect. Her behavior is normal.     ED Treatments / Results  Labs (all labs ordered are listed, but only abnormal results are displayed) Labs Reviewed  BASIC METABOLIC PANEL - Abnormal; Notable for the following:       Result Value   CO2 20 (*)    Glucose, Bld 291 (*)    All other components within normal limits  CBC - Abnormal; Notable for the  following:    WBC 16.4 (*)    RBC 5.47 (*)    Hemoglobin 16.7 (*)    HCT 48.6 (*)    All other components within normal limits  BRAIN NATRIURETIC PEPTIDE - Abnormal; Notable for the following:    B Natriuretic Peptide 102.3 (*)    All other components within normal limits  D-DIMER, QUANTITATIVE (NOT AT [facility])  I-STAT TROPONIN, ED    EKG  EKG Interpretation  Date/Time:  Monday May 16 2017 12:05:06 EDT Ventricular Rate:  163 PR Interval:    QRS Duration: 86 QT Interval:  288 QTC Calculation: 474 R Axis:   61 Text Interpretation:  afib new Confirmed by Bary CastillaMackuen, Courteney ([id number]) on 05/16/2017 12:47:03 PM       Radiology Dg Chest Portable 1 View  Result Date: 05/16/2017 CLINICAL DATA:  Shortness of breath. EXAM: PORTABLE CHEST 1 VIEW COMPARISON:  Chest x-ray dated June 28, 2016. FINDINGS: The cardiomediastinal silhouette is normal in size. Normal pulmonary vascularity. Pacer pad over the left apex. No focal consolidation, pleural effusion, or pneumothorax. No acute osseous abnormality. IMPRESSION: No active disease. Electronically Signed   By: Obie DredgeWilliam T Derry M.D.   On: 05/16/2017 12:39    Procedures Procedures (including critical care time)  Medications Ordered in ED Medications  diltiazem (CARDIZEM) 100 mg in dextrose 5 % 100 mL (1 mg/mL) infusion (10 mg/hr Intravenous Rate/Dose Change 05/16/17 1459)  diltiazem  (CARDIZEM) injection 10 mg (10 mg Intravenous Given 05/16/17 1256)     Initial Impression / Assessment and Plan / ED Course  I have reviewed the triage vital signs and the nursing notes.  Pertinent labs & imaging results that were available during my care of the patient were reviewed by me and considered in my medical decision making (see chart for details).   41 year old female with PMH of Afib presents in Afib with RVR. Heart rate in 150-160s at presentation. Dyspnea on exertion concerning for new onset CHF. CXR negative and patient without any signs of peripheral edema on exam. Echo from 2017 with EF 65-70% and normal diastolic dysfunction. BNP mildly elevated to 102. Concern for PE given new onset dyspnea. Well's score 1.5 for tachycardia. Therefore, D-dimer obtained and WNL at 0.38. Troponin obtained and negative. CBC only remarkable for leukocytosis to 16. BMET unremarkable.  Patient given Diltiazem loading dose then started on Dilt drip. HR remains in 130s. Will consult cardiology for further recommendations. Patient signed out to Dr. Corlis LeakMackuen for further management.     Final Clinical Impressions(s) / ED Diagnoses   Final diagnoses:  Atrial fibrillation with RVR [facility](HCC)    New Prescriptions New Prescriptions   No medications on file     Arvilla MarketWallace, Keanu Lesniak Lauren, DO 05/16/17 1529    Abelino DerrickMackuen, Courteney Lyn, MD 05/17/17 1525

## 2017-05-16 NOTE — ED Notes (Signed)
Pt hooked back up to cardiac monitor. Pt resting comfortably. HR remains WDL.

## 2017-05-16 NOTE — H&P (Signed)
History & Physical    Patient ID: Julia Dickerson MRN: [id number], DOB/AGE: [birth year]   Admit date: 05/16/2017  Primary Physician: Caroll Rancher, NP Primary Cardiologist: Herbie Baltimore   Patient Profile    41 yo female with PMH of PAF, HTN, GERD, PCOS, and DM who presented to the ED with palpitations, found to be in Afib RVR.   Past Medical History   Past Medical History:  Diagnosis Date  . Anxiety   . Atrial fibrillation (HCC)   . Atrial fibrillation (HCC)   . Depression   . Diabetes mellitus without complication (HCC)   . Dysrhythmia   . GERD (gastroesophageal reflux disease)   . Hypertension   . Obesity   . PCOS (polycystic ovarian syndrome)   . UTI (lower urinary tract infection)     Past Surgical History:  Procedure Laterality Date  . DILATION AND CURETTAGE OF UTERUS    . GXT - Graded Exercise Tolerance Test  07/22/2016   Exercise time 6 minutes - 7 METs. Maximum heart rate 146 BPM. - No EKG changes. No arrhythmias or chest pain.: Low risk test.  . TRANSTHORACIC ECHOCARDIOGRAM  06/2016   EF 6570% with mild LVH. Normal wall motion and diastolic function. Mild RAE. Otherwise normal.  . WISDOM TOOTH EXTRACTION       Allergies  Allergies  Allergen Reactions  . Sulfa Antibiotics Hives    History of Present Illness    [name] is a 41 year old female with past medical history of PAF, hypertension, GERD, PCOS and diabetes. She initially presented to the ER in 11/16 it was first diagnosed with PAF. She was noted to be in atrial flutter RVR and was given procainamide in the ER with conversion to sinus rhythm. At that time she was started on Eliquis. She had an exercise stress test 11/17 which was considered low risk, and echo showed normal EF with mild LVH, and no wall motion abnormality. She was recently seen in the office on 1/18 with Dr. Herbie Baltimore and reported doing well in her usual state of health. Plan was to continue flecainide 200 mg daily as needed for A. fib, along  with her diltiazem. Given the duration between her episodes it was felt that she could stop her oral anticoagulation, but restart if further episodes develop.  Reports she was in her usual state of health until 5 AM this morning. States she was awoken from sleep and noted that her heart was irregular, and "out of rhythm". Reported feeling very tired, weak, and somewhat dyspneic. States she took a flecainide 200 mg dose, and then took 100 mg thereafter whenever symptoms persisted. Ultimately presented to the ED.  Admission labs showed stable electrolytes, troponin 0.01, BNP 102. Chest x-ray negative. EKG shows A. fib RVR. She was started on IV diltiazem, with a 10 mg bolus. Currently denies any chest pain, but reports feeling weak, and tired.  Home Medications    Prior to Admission medications   Medication Sig Start Date End Date Taking? Authorizing Provider  apixaban (ELIQUIS) 5 MG TABS tablet Take 1 tablet (5 mg total) by mouth 2 (two) times daily as needed. 08/18/16  Yes Marykay Lex, MD  atorvastatin (LIPITOR) 20 MG tablet Take 1 tablet (20 mg total) by mouth daily. 07/13/14  Yes Thermon Leyland, NP  Cholecalciferol (VITAMIN D3) 50000 units CAPS Take 1 tablet by mouth once a week. 07/10/16  Yes [provider]  cyclobenzaprine (FLEXERIL) 10 MG tablet Take 1 tablet (10 mg total) by  mouth 3 (three) times daily as needed for muscle spasms. 01/06/17  Yes Deatra Canterxford, William J, FNP  diltiazem (CARDIZEM CD) 120 MG 24 hr capsule Take 1 capsule (120 mg total) by mouth daily. 06/23/16  Yes Marykay LexHarding, Laurna Shetley W, MD  diltiazem (CARDIZEM) 60 MG tablet Take 1 tablet if needed for palpitations. Can be taken every 6 hours. 06/05/15  Yes Pollina, Canary Brimhristopher J, MD  flecainide (TAMBOCOR) 100 MG tablet Take 2 tablets (200 mg total) by mouth as needed. For A-fib 08/18/16  Yes Marykay LexHarding, Carnie Bruemmer W, MD  glimepiride (AMARYL) 2 MG tablet Take 2 mg by mouth 2 (two) times daily.    Yes [provider]  lisinopril  (PRINIVIL,ZESTRIL) 10 MG tablet Take 1 tablet (10 mg total) by mouth daily. 06/23/16  Yes Marykay LexHarding, Tracey Hermance W, MD  metFORMIN (GLUCOPHAGE) 1000 MG tablet Take 1 tablet (1,000 mg total) by mouth 2 (two) times daily with a meal. 07/13/14  Yes Thermon Leylandavis, Laura A, NP  aspirin 325 MG tablet Take 325 mg by mouth daily.    [provider]  co-enzyme Q-10 30 MG capsule Take 30 mg by mouth daily.    [provider]  diclofenac (VOLTAREN) 75 MG EC tablet Take 1 tablet (75 mg total) by mouth 2 (two) times daily. Take with food Patient not taking: Reported on 05/16/2017 01/06/17   Deatra Canterxford, William J, FNP  methylPREDNISolone (MEDROL DOSEPAK) 4 MG TBPK tablet follow package directions Patient not taking: Reported on 05/16/2017 09/02/16   Domenick GongMortenson, Ashley, MD    Family History    Family History  Problem Relation Age of Onset  . Heart failure Father   . Heart attack Father     Social History    Social History   Social History  . Marital status: Married    Spouse name: N/A  . Number of children: N/A  . Years of education: N/A   Occupational History  . Not on file.   Social History Main Topics  . Smoking status: Current Every Day Smoker    Packs/day: 0.50    Years: 21.00    Types: Cigarettes, E-cigarettes  . Smokeless tobacco: Never Used     Comment: vapor  . Alcohol use Yes     Comment: rare  . Drug use: No  . Sexual activity: Yes    Birth control/ protection: None   Other Topics Concern  . Not on file   Social History Narrative  . No narrative on file     Review of Systems    See HPI  All other systems reviewed and are otherwise negative except as noted above.  Physical Exam    Blood pressure (!) 128/96, pulse (!) 112, temperature 98.1 F (36.7 C), temperature source Oral, resp. rate 15, height 5\' 8"  (1.727 m), weight 260 lb (117.9 kg), SpO2 97 %.  General: Pleasant, obese female, NAD Psych: Normal affect. Neuro: Alert and oriented X 3. Moves all extremities  spontaneously. HEENT: Normal  Neck: Supple without bruits or JVD. Lungs:  Resp regular and unlabored, CTA. Heart: Irreg Irreg no s3, s4, or murmurs. Abdomen: Soft, non-tender, non-distended, BS + x 4.  Extremities: No clubbing, cyanosis or edema. DP/PT/Radials 2+ and equal bilaterally.  Labs    Troponin [facility](Point of Care Test)  Recent Labs  05/16/17 1231  TROPIPOC 0.01   No results for input(s): CKTOTAL, CKMB, TROPONINI in the last 72 hours. Lab Results  Component Value Date   WBC 16.4 (H) 05/16/2017   HGB 16.7 (H)  05/16/2017   HCT 48.6 (H) 05/16/2017   MCV 88.8 05/16/2017   PLT 363 05/16/2017    Recent Labs Lab 05/16/17 1215  NA 136  K 4.5  CL 101  CO2 20*  BUN 11  CREATININE 0.84  CALCIUM 9.7  GLUCOSE 291*   No results found for: CHOL, HDL, LDLCALC, TRIG Lab Results  Component Value Date   DDIMER 0.38 05/16/2017     Radiology Studies    Dg Chest Portable 1 View  Result Date: 05/16/2017 CLINICAL DATA:  Shortness of breath. EXAM: PORTABLE CHEST 1 VIEW COMPARISON:  Chest x-ray dated June 28, 2016. FINDINGS: The cardiomediastinal silhouette is normal in size. Normal pulmonary vascularity. Pacer pad over the left apex. No focal consolidation, pleural effusion, or pneumothorax. No acute osseous abnormality. IMPRESSION: No active disease. Electronically Signed   By: Obie Dredge M.D.   On: 05/16/2017 12:39    ECG & Cardiac Imaging    Echo: 12/17  Study Conclusions  - Left ventricle: The cavity size was normal. Wall thickness was   increased in a pattern of mild LVH. Systolic function was   vigorous. The estimated ejection fraction was in the range of 65%   to 70%. Wall motion was normal; there were no regional wall   motion abnormalities. Left ventricular diastolic function   parameters were normal. - Mitral valve: Mildly thickened leaflets . There was trivial   regurgitation. - Left atrium: The atrium was normal in size. - Right atrium: The atrium  was mildly dilated. - Inferior vena cava: The vessel was normal in size. The   respirophasic diameter changes were in the normal range (>= 50%),   consistent with normal central venous pressure.  Impressions:  - LVEF 65-70%, mild LVH, normal wall motion, normal diastolic   function, mild RAE, normal LA size, normal IVC.  Assessment & Plan    41 yo female with PMH of PAF, HTN, GERD, PCOS, and DM who presented to the ED with palpitations, found to be in Afib RVR.   1. A. fib RVR: Patient reports she woke up around 5 AM this morning and felt that her heart was out of rhythm. Also felt weak, tired and dyspneic. States she has not consistently been on her OAC prior to admission as she has not had an episode in quite some time. In the ED she was started on IV dilt, with a 10 mg bolus. Rate still remains elevated. Given she has already had 300 mg of flecainide, we will avoid further antiarrhythmics such as amiodarone at this time. Will plan for admission and continue on IV dilt. If she does not convert spontaneously, with tentatively plan for TEE/DCCV in the a.m. -- Admitted to telemetry -- Titrate IV diltiazem -- Resume Eliquis -- NPO after Midnight  2. HTN: Hold home lisinopril, given need for IV dilt to allow blood pressure room.   3. DM: hold metformin, SSI while inpatient  Signed, Laverda Page, NP-C Pager [phone] 05/16/2017, 2:45 PM   I have seen, examined and evaluated the patient this PM along with Laverda Page, NP-C.  After reviewing all the available data and chart, we discussed the patients laboratory, study & physical findings as well as symptoms in detail. I agree with her findings, examination as well as impression recommendations as per our discussion.    Nyela is well-known to me. She is very pleasant young woman who has had history of PAF for quite some time now. For the most part this is  been relatively well-controlled with standing dose of diltiazem as well  as when necessary diltiazem and flecainide. In the past she has never convert herself from A. fib/flutter into sinus rhythm using flecainide. Unfortunately, she woke up this morning feeling her heart racing very short of breath and dyspneic. She was unsuccessful cardioverting herself using pill in the pocket flecainide plus diltiazem and therefore came to the emergency room. In emergency room on diltiazem drip after receiving 20 of IV diltiazem total she still is having heart rates in the 150 bpm range. Unfortunately because of her age and the lack of any recurrent symptoms, we had stopped her Eliquis at the time of her last visit. She indicated that the cost was almost prohibitive. At this point I think the best course of action is to admit her and restart ELIQUIS. If she does not spontaneously convert on diltiazem, would consider TEE cardioversion tomorrow. We talked about the potential of doing non-TEE guided cardioversion today since we know the time of her initiation, she did not want to go forward with this plan given the potential risk, nor did I given her age.  Otherwise we will continue her home medications probably having hold the lisinopril until she is back off of the IV diltiazem.  We would be unable to use amiodarone for rate/rhythm control since she is already had flecainide. We'll discuss with electrophysiology potential options for when necessary rhythm control since she now has failed flecainide.    Bryan Lemma, M.D., M.S. Interventional Cardiologist   Pager # [phone] Phone # [phone] [address] [place], [place] [address]

## 2017-05-16 NOTE — ED Triage Notes (Signed)
Pt reports hx of afib. Woke up this am with palpitations, took meds prescribed by cardiology and no relief of symptoms. Reports sob with mild exertion. afib rvr on ekg at triage. Rate 160's.

## 2017-05-17 ENCOUNTER — Telehealth: Payer: Self-pay | Admitting: Student

## 2017-05-17 ENCOUNTER — Encounter (HOSPITAL_COMMUNITY)
Admission: EM | Disposition: A | Discharge status: Home / Self Care | Payer: Self-pay | Source: Home / Self Care | Attending: Physician Assistant

## 2017-05-17 DIAGNOSIS — I4891 Unspecified atrial fibrillation: Secondary | ICD-10-CM | POA: Diagnosis not present

## 2017-05-17 DIAGNOSIS — I480 Paroxysmal atrial fibrillation: Secondary | ICD-10-CM | POA: Diagnosis not present

## 2017-05-17 LAB — BASIC METABOLIC PANEL
Anion gap: 12 (ref 5–15)
BUN: 13 mg/dL (ref 6–20)
CO2: 24 mmol/L (ref 22–32)
Calcium: 9.1 mg/dL (ref 8.9–10.3)
Chloride: 98 mmol/L — ABNORMAL LOW (ref 101–111)
Creatinine, Ser: 0.8 mg/dL (ref 0.44–1.00)
GFR calc Af Amer: >60 mL/min (ref >60)
GFR calc non Af Amer: >60 mL/min (ref >60)
Glucose, Bld: 216 mg/dL — ABNORMAL HIGH (ref 65–99)
Potassium: 3.9 mmol/L (ref 3.5–5.1)
Sodium: 134 mmol/L — ABNORMAL LOW (ref 135–145)

## 2017-05-17 LAB — HIV ANTIBODY (ROUTINE TESTING W REFLEX): HIV Screen 4th Generation wRfx: NONREACTIVE

## 2017-05-17 SURGERY — ECHOCARDIOGRAM, TRANSESOPHAGEAL
Anesthesia: Monitor Anesthesia Care

## 2017-05-17 MED ORDER — DILTIAZEM HCL ER COATED BEADS 180 MG PO CP24: 180.0000 mg | ORAL_CAPSULE | Freq: Every day | ORAL | 1 refills | Status: DC

## 2017-05-17 MED ORDER — GLIMEPIRIDE 2 MG PO TABS
2.0000 mg | ORAL_TABLET | Freq: Two times a day (BID) | ORAL | Status: DC
  Filled 2017-05-17: qty 1

## 2017-05-17 MED ORDER — DILTIAZEM HCL 60 MG PO TABS: ORAL_TABLET | ORAL | 0 refills | Status: DC

## 2017-05-17 MED ORDER — DILTIAZEM HCL ER COATED BEADS 180 MG PO CP24
180.0000 mg | ORAL_CAPSULE | Freq: Every day | ORAL | Status: DC
  Filled 2017-05-17: qty 1

## 2017-05-17 MED ORDER — ACETAMINOPHEN 325 MG PO TABS: 650.0000 mg | ORAL_TABLET | ORAL | Status: DC | PRN

## 2017-05-17 MED ORDER — ATORVASTATIN CALCIUM 10 MG PO TABS: 20.0000 mg | ORAL_TABLET | Freq: Every day | ORAL | Status: DC

## 2017-05-17 MED ORDER — ONDANSETRON HCL 4 MG/2ML IJ SOLN: 4.0000 mg | Freq: Four times a day (QID) | INTRAMUSCULAR | Status: DC | PRN

## 2017-05-17 MED ORDER — DILTIAZEM HCL 100 MG IV SOLR
5.0000 mg/h | INTRAVENOUS | Status: DC
  Administered 2017-05-17: 10 mg/h via INTRAVENOUS

## 2017-05-17 MED ORDER — APIXABAN 5 MG PO TABS: 5.0000 mg | ORAL_TABLET | Freq: Two times a day (BID) | ORAL | 3 refills | Status: DC

## 2017-05-17 NOTE — ED Notes (Signed)
Pt resting quietly at this time with no complaints.  Pt's heart rate 80 NSR

## 2017-05-17 NOTE — Discharge Summary (Signed)
Discharge Summary    Patient ID: Julia Dickerson,  MRN: [id number], DOB/AGE: [birth year] 41 y.o.  Admit date: 05/16/2017 Discharge date: 05/17/2017  Primary Care Provider: [name], [name] Primary Cardiologist: Herbie BaltimoreHarding  Discharge Diagnoses    Active Problems:   Atrial fibrillation [facility](HCC)   Allergies Allergies  Allergen Reactions  . Sulfa Antibiotics Hives    Diagnostic Studies/Procedures    N/a _____________   History of Present Illness     [name] is a 41 year old female with past medical history of PAF, hypertension, GERD, PCOS and diabetes. She initially presented to the ER in 11/16 it was first diagnosed with PAF. She was noted to be in atrial flutter RVR and was given procainamide in the ER with conversion to sinus rhythm. At that time she was started on Eliquis. She had an exercise stress test 11/17 which was considered low risk, and echo showed normal EF with mild LVH, and no wall motion abnormality. She was recently seen in the office on 1/18 with Dr. Herbie BaltimoreHarding and reported doing well in her usual state of health. Plan was to continue flecainide 200 mg daily as needed for A. fib, along with her diltiazem. Given the duration between her episodes it was felt that she could stop her oral anticoagulation, but restart if further episodes develop.  Reported she was in her usual state of health until 5 AM the morning of admission. Stated she was awoken from sleep and noted that her heart was irregular, and "out of rhythm". Reported feeling very tired, weak, and somewhat dyspneic. States she took a flecainide 200 mg dose, and then took 100 mg thereafter whenever symptoms persisted. Ultimately presented to the ED.  Admission labs showed stable electrolytes, troponin 0.01, BNP 102. Chest x-ray negative. EKG shows A. fib RVR. She was started on IV diltiazem, with a 10 mg bolus. Denied any chest pain, but reported feeling weak, and tired. She was admitted with plans for possible TEE/DCCV  if no spontaneous conversion. Given she had already received 300 mg of flecainide, further antiarrhythmics were avoided.  Hospital Course     She was continued on IV diltiazem, and did spontaneously convert to sinus rhythm later the evening of admission. She was restarted on Eliquis 5 mg twice a day. Maintain sinus rhythm throughout the rest of admission. The following morning she was converted to oral diltiazem 180 mg daily. We'll plan to continue this dose, with PRN diltiazem 60 mg along with previous medication regimen of flecainide 200 mg PRN. Morning labs were stable. No further symptoms reported.  Julia Dickerson was seen by Dr. Herbie BaltimoreHarding and determined stable for discharge home. Follow up in the office has been arranged. Medications are listed below.   _____________  Discharge Vitals Blood pressure (!) 136/97, pulse 84, temperature 98.1 F (36.7 C), temperature source Oral, resp. rate 20, height 5\' 8"  (1.727 m), weight 260 lb (117.9 kg), SpO2 98 %.  Filed Weights   05/16/17 1201  Weight: 260 lb (117.9 kg)   General appearance: alert, cooperative, appears stated age and no distress Lungs: clear to auscultation bilaterally, normal percussion bilaterally and non-labored Heart: regular rate and rhythm, S1, S2 normal, no murmur, click, rub or gallop and normal apical impulse Pulses: 2+ and symmetric Neurologic: Grossly normal   Labs & Radiologic Studies    CBC  Recent Labs  05/16/17 1215  WBC 16.4*  HGB 16.7*  HCT 48.6*  MCV 88.8  PLT 363   Basic Metabolic Panel  Recent Labs  05/16/17 1215 05/17/17 0535  NA 136 134*  K 4.5 3.9  CL 101 98*  CO2 20* 24  GLUCOSE 291* 216*  BUN 11 13  CREATININE 0.84 0.80  CALCIUM 9.7 9.1   Liver Function Tests No results for input(s): AST, ALT, ALKPHOS, BILITOT, PROT, ALBUMIN in the last 72 hours. No results for input(s): LIPASE, AMYLASE in the last 72 hours. Cardiac Enzymes No results for input(s): CKTOTAL, CKMB, CKMBINDEX,  TROPONINI in the last 72 hours. BNP Invalid input(s): POCBNP D-Dimer  Recent Labs  05/16/17 1228  DDIMER 0.38   Hemoglobin A1C No results for input(s): HGBA1C in the last 72 hours. Fasting Lipid Panel No results for input(s): CHOL, HDL, LDLCALC, TRIG, CHOLHDL, LDLDIRECT in the last 72 hours. Thyroid Function Tests No results for input(s): TSH, T4TOTAL, T3FREE, THYROIDAB in the last 72 hours.  Invalid input(s): FREET3 _____________  Dg Chest Portable 1 View  Result Date: 05/16/2017 CLINICAL DATA:  Shortness of breath. EXAM: PORTABLE CHEST 1 VIEW COMPARISON:  Chest x-ray dated June 28, 2016. FINDINGS: The cardiomediastinal silhouette is normal in size. Normal pulmonary vascularity. Pacer pad over the left apex. No focal consolidation, pleural effusion, or pneumothorax. No acute osseous abnormality. IMPRESSION: No active disease. Electronically Signed   By: Obie Dredge M.D.   On: 05/16/2017 12:39   Disposition   Pt is being discharged home today in good condition.  Follow-up Plans & Appointments    Follow-up Information    Ellsworth Lennox, PA-C Follow up on 05/31/2017.   Specialties:  Physician Assistant, Cardiology Why:  at 9am for your follow up appt.  Contact information: [address] [place] [address] [phone]          Discharge Instructions    Diet - low sodium heart healthy    Complete by:  As directed    Discharge instructions    Complete by:  As directed    We have increased her home diltiazem dose from 120 mg daily to 180 mg daily. Continue to use 60 mg diltiazem tablets as needed for palpitations, and flecainide as instructed. You will now need to be on anticoagulation with eliquis daily given the recurrence of your atrial fibrillation. Follow-up in the office is been arranged. Please call with any questions.   Increase activity slowly    Complete by:  As directed       Discharge Medications     Medication List    STOP  taking these medications   aspirin 325 MG tablet   diclofenac 75 MG EC tablet Commonly known as:  VOLTAREN   methylPREDNISolone 4 MG Tbpk tablet Commonly known as:  MEDROL DOSEPAK     TAKE these medications   apixaban 5 MG Tabs tablet Commonly known as:  ELIQUIS Take 1 tablet (5 mg total) by mouth 2 (two) times daily. What changed:  when to take this  reasons to take this   atorvastatin 20 MG tablet Commonly known as:  LIPITOR Take 1 tablet (20 mg total) by mouth daily.   co-enzyme Q-10 30 MG capsule Take 30 mg by mouth daily.   cyclobenzaprine 10 MG tablet Commonly known as:  FLEXERIL Take 1 tablet (10 mg total) by mouth 3 (three) times daily as needed for muscle spasms.   diltiazem 180 MG 24 hr capsule Commonly known as:  CARDIZEM CD Take 1 capsule (180 mg total) by mouth daily. What changed:  medication strength  how much to take   diltiazem 60 MG  tablet Commonly known as:  CARDIZEM Take 1 tablet if needed for palpitations. Can be taken every 6 hours.   flecainide 100 MG tablet Commonly known as:  TAMBOCOR Take 2 tablets (200 mg total) by mouth as needed. For A-fib   glimepiride 2 MG tablet Commonly known as:  AMARYL Take 2 mg by mouth 2 (two) times daily.   lisinopril 10 MG tablet Commonly known as:  PRINIVIL,ZESTRIL Take 1 tablet (10 mg total) by mouth daily.   metFORMIN 1000 MG tablet Commonly known as:  GLUCOPHAGE Take 1 tablet (1,000 mg total) by mouth 2 (two) times daily with a meal.   Vitamin D3 50000 units Caps Take 1 tablet by mouth once a week.        Outstanding Labs/Studies   N/a   Duration of Discharge Encounter   Greater than 30 minutes including physician time.  Signed, Laverda Page NP-C 05/17/2017, 10:37 AM   I have seen, examined and evaluated the patient this AM along with Laverda Page, NP-C.  After reviewing all the available data and chart, we discussed the patients laboratory, study & physical findings as well  as symptoms in detail. I agree with her findings, examination (performed by me) as well as impression & summary as per our discussion.    Obs. Admission for Afib RVR resistant to Pill-in-pocked Flecainide.  Converted last PM on IV Diltiazem gtt -- converted to PO today @ 180mg  Dilt XT (higher than prior dose).  For future breakthrough - will use 200 mg Flecainide + 60 mg Diltiazem (short acting) - followed by 2nd dose 100 mg Flecaine (if still in Afib after ~3-4 hrs) with additional 60 mg Diltiazem (short acting).  OK for d/c home.   Bryan Lemma, M.D., M.S. Interventional Cardiologist   Pager # [phone] Phone # [phone] [address] [place], [place] [address]

## 2017-05-17 NOTE — ED Notes (Addendum)
Spoke w/ Dr. Mayford Knifeurner, Admitting MD, stated ok for pt to eat and drink.

## 2017-05-17 NOTE — ED Notes (Signed)
Pt made NPO again. Procedure rescheduled for today @ 1200.

## 2017-05-17 NOTE — Telephone Encounter (Signed)
New message    Pt has TOC appt on 11/6 with [place]. Per Laverda PageLindsay Roberts.

## 2017-05-17 NOTE — ED Notes (Signed)
Pt ambulatory to the restroom.  

## 2017-05-18 ENCOUNTER — Encounter: Payer: Self-pay | Admitting: *Deleted

## 2017-05-18 NOTE — Telephone Encounter (Signed)
New Message ° ° pt verbalized that she is returning call for rn °

## 2017-05-18 NOTE — Telephone Encounter (Signed)
Returning tour call. °

## 2017-05-18 NOTE — Telephone Encounter (Signed)
Left message for pt to call.

## 2017-05-18 NOTE — Telephone Encounter (Signed)
Patient contacted regarding discharge from [place] on 05/18/17 Patient understands to follow [facility] with [facility] str[facility] Patient understands discharge instructions? yes Patient understands medications and regiment? yes Patient understands to bring all medications to this visit? yes

## 2017-05-24 ENCOUNTER — Telehealth: Payer: Self-pay | Admitting: Cardiology

## 2017-05-24 NOTE — Telephone Encounter (Signed)
Patient brought FMLA Forms to office for them to be filled out and signed.  Received signed AUTH/Forms/Money Order.  Sent forms to [employer] @ [employer] for processing. lp

## 2017-05-25 ENCOUNTER — Telehealth: Payer: Self-pay | Admitting: Cardiology

## 2017-05-25 NOTE — Telephone Encounter (Signed)
Received FMLA Forms back from [employer] @ [employer] for Dr Herbie BaltimoreHarding to review, complete and sign.  Forms given to Dr Herbie BaltimoreHarding. lp

## 2017-05-29 NOTE — Progress Notes (Signed)
Cardiology Office Note    Date:  05/31/2017   ID:  Julia Dickerson, DOB [birth year], MRN [id number]  PCP:  Caroll Rancher, NP  Cardiologist: Dr. Herbie Baltimore  Chief Complaint  Patient presents with  . Hospitalization Follow-up    History of Present Illness:    Julia Dickerson is a 41 y.o. female with past medical history of PAF, HTN, PCOS, Type 2 DM, and tobacco use who presents to the office today for hospital follow-up.   She was recently admitted to [facility] from 10/22 - 05/17/2017 for evaluation of an irregular heart rhythm which started on 0500 the morning of her presentation. She was found to be in atrial fibrillation with RVR and started on IV Cardizem. Initial troponin and D-dimer were negative with remaining labs showing no acute electrolyte abnormalities. WBC was mildly elevated at 16.4. She had already taken her regular dosing of Flecainide along with an additional 100mg  tablet, therefore further antiarrhythmics were not administered. The initial plan was to undergo a TEE-guided DCCV the following morning but she spontaneously converted to NSR overnight. She was switched over to PO Cardizem 180mg  daily and Eliquis 5mg  BID at the time of hospital discharge.   In talking with the patient, she reports doing well since her recent hospitalization.  She denies any repeat episodes of palpitations or dyspnea.  No recent chest discomfort, orthopnea, PND, lower extremity edema, dizziness, or presyncope. She reports good compliance with her medications and denies missing any recent doses.  She does not check her blood pressure regularly at home and it is elevated at 152/100 during today's visit.  She reports having taken her medications less than 30 minutes prior to arriving to her appointment.  Blood pressure is rechecked and at 144/88.  She remains on Eliquis 5 mg BID and denies any recent melena, hematochezia, or hematuria.    Past Medical History:  Diagnosis Date  . Anxiety   . Atrial  fibrillation (HCC)    a. paroxysmal, uses PRN Flecainide  . Depression   . Diabetes mellitus without complication (HCC)   . Dysrhythmia   . GERD (gastroesophageal reflux disease)   . Hypertension   . Obesity   . PCOS (polycystic ovarian syndrome)   . UTI (lower urinary tract infection)     Past Surgical History:  Procedure Laterality Date  . DILATION AND CURETTAGE OF UTERUS    . GXT - Graded Exercise Tolerance Test  07/22/2016   Exercise time 6 minutes - 7 METs. Maximum heart rate 146 BPM. - No EKG changes. No arrhythmias or chest pain.: Low risk test.  . TRANSTHORACIC ECHOCARDIOGRAM  06/2016   EF 6570% with mild LVH. Normal wall motion and diastolic function. Mild RAE. Otherwise normal.  . WISDOM TOOTH EXTRACTION      Current Medications: Outpatient Medications Prior to Visit  Medication Sig Dispense Refill  . atorvastatin (LIPITOR) 20 MG tablet Take 1 tablet (20 mg total) by mouth daily.    [name] diltiazem (CARDIZEM) 60 MG tablet Take 1 tablet if needed for palpitations. Can be taken every 6 hours. 20 tablet 0  . flecainide (TAMBOCOR) 100 MG tablet Take 2 tablets (200 mg total) by mouth as needed. For A-fib 10 tablet 6  . glimepiride (AMARYL) 2 MG tablet Take 2 mg by mouth 2 (two) times daily.     . metFORMIN (GLUCOPHAGE) 1000 MG tablet Take 1 tablet (1,000 mg total) by mouth 2 (two) times daily with a meal.    . apixaban (ELIQUIS) 5  MG TABS tablet Take 1 tablet (5 mg total) by mouth 2 (two) times daily. 60 tablet 3  . aspirin 325 MG tablet Take 325 mg by mouth daily.    . Cholecalciferol (VITAMIN D3) 50000 units CAPS Take 1 tablet by mouth once a week.  1  . co-enzyme Q-10 30 MG capsule Take 30 mg by mouth daily.    . cyclobenzaprine (FLEXERIL) 10 MG tablet Take 1 tablet (10 mg total) by mouth 3 (three) times daily as needed for muscle spasms. (Patient not taking: Reported on 05/31/2017) 30 tablet 0  . diclofenac (VOLTAREN) 75 MG EC tablet Take 1 tablet (75 mg total) by mouth 2 (two)  times daily. Take with food (Patient not taking: Reported on 05/16/2017) 30 tablet 0  . diltiazem (CARDIZEM CD) 180 MG 24 hr capsule Take 1 capsule (180 mg total) by mouth daily. 90 capsule 1  . lisinopril (PRINIVIL,ZESTRIL) 10 MG tablet Take 1 tablet (10 mg total) by mouth daily. 90 tablet 3  . methylPREDNISolone (MEDROL DOSEPAK) 4 MG TBPK tablet follow package directions (Patient not taking: Reported on 05/16/2017) 21 tablet 0   No facility-administered medications prior to visit.      Allergies:   Sulfa antibiotics   Social History   Socioeconomic History  . Marital status: Married    Spouse name: None  . Number of children: None  . Years of education: None  . Highest education level: None  Social Needs  . Financial resource strain: None  . Food insecurity - worry: None  . Food insecurity - inability: None  . Transportation needs - medical: None  . Transportation needs - non-medical: None  Occupational History  . None  Tobacco Use  . Smoking status: Current Every Day Smoker    Packs/day: 0.50    Years: 21.00    Pack years: 10.50    Types: Cigarettes, E-cigarettes  . Smokeless tobacco: Never Used  . Tobacco comment: vapor  Substance and Sexual Activity  . Alcohol use: Yes    Comment: rare  . Drug use: No  . Sexual activity: Yes    Birth control/protection: None  Other Topics Concern  . None  Social History Narrative  . None     Family History:  The patient's family history includes Heart attack in her father; Heart failure in her father.   Review of Systems:   Please see the history of present illness.     General:  No chills, fever, night sweats or weight changes.  Cardiovascular:  No chest pain, dyspnea on exertion, edema, orthopnea, paroxysmal nocturnal dyspnea. Positive for palpitations (now resolved).  Dermatological: No rash, lesions/masses Respiratory: No cough, dyspnea Urologic: No hematuria, dysuria Abdominal:   No nausea, vomiting, diarrhea, bright  red blood per rectum, melena, or hematemesis Neurologic:  No visual changes, wkns, changes in mental status. All other systems reviewed and are otherwise negative except as noted above.   Physical Exam:    VS:  BP (!) 144/88   Pulse (!) 107   Ht 5\' 8"  (1.727 m)   Wt 258 lb (117 kg)   SpO2 98%   BMI 39.23 kg/m    General: Well developed, well nourished Caucasian female appearing in no acute distress. Head: Normocephalic, atraumatic, sclera non-icteric, no xanthomas, nares are without discharge.  Neck: No carotid bruits. JVD not elevated.  Lungs: Respirations regular and unlabored, without wheezes or rales.  Heart: Regular rate and rhythm. No S3 or S4.  No murmur, no rubs, or  gallops appreciated. Abdomen: Soft, non-tender, non-distended with normoactive bowel sounds. No hepatomegaly. No rebound/guarding. No obvious abdominal masses. Msk:  Strength and tone appear normal for age. No joint deformities or effusions. Extremities: No clubbing or cyanosis. No lower extremity edema.  Distal pedal pulses are 2+ bilaterally. Neuro: Alert and oriented X 3. Moves all extremities spontaneously. No focal deficits noted. Psych:  Responds to questions appropriately with a normal affect. Skin: No rashes or lesions noted  Wt Readings from Last 3 Encounters:  05/31/17 258 lb (117 kg)  05/16/17 260 lb (117.9 kg)  08/18/16 257 lb 3.2 oz (116.7 kg)     Studies/Labs Reviewed:   EKG:  EKG is not ordered today.  Recent Labs: 05/16/2017: B Natriuretic Peptide 102.3; Hemoglobin 16.7; Platelets 363 05/17/2017: BUN 13; Creatinine, Ser 0.80; Potassium 3.9; Sodium 134   Lipid Panel No results found for: CHOL, TRIG, HDL, CHOLHDL, VLDL, LDLCALC, LDLDIRECT  Additional studies/ records that were reviewed today include:   Echocardiogram: 06/2016 Study Conclusions  - Left ventricle: The cavity size was normal. Wall thickness was   increased in a pattern of mild LVH. Systolic function was   vigorous. The  estimated ejection fraction was in the range of 65%   to 70%. Wall motion was normal; there were no regional wall   motion abnormalities. Left ventricular diastolic function   parameters were normal. - Mitral valve: Mildly thickened leaflets . There was trivial   regurgitation. - Left atrium: The atrium was normal in size. - Right atrium: The atrium was mildly dilated. - Inferior vena cava: The vessel was normal in size. The   respirophasic diameter changes were in the normal range (>= 50%),   consistent with normal central venous pressure.  Impressions:  - LVEF 65-70%, mild LVH, normal wall motion, normal diastolic   function, mild RAE, normal LA size, normal IVC.   ETT: 06/2016  There was no ST segment deviation noted during stress.   Submaximal study but no ST deviation noted.   Assessment:    1. Paroxysmal atrial fibrillation (HCC)   2. Current use of long term anticoagulation   3. Essential hypertension   4. Type 2 diabetes mellitus without complication, without long-term current use of insulin (HCC)      Plan:   In order of problems listed above:  1. Paroxysmal Atrial Fibrillation/ Use of Long-term Anticoagulation  - the patient has a known history of PAF and has used PRN Flecainide in the past for this. Was recently admitted for persistent atrial fibrillation which did not resolve with Flecainide. She spontaneously converted to NSR overnight with administration of IV Cardizem.  - she denies any repeat episodes of palpitations since hospital discharge. She is maintaining NSR by examination today. Continue Cardizem CD 180mg  daily along with PRN Flecainide.  - This patients CHA2DS2-VASc Score and unadjusted Ischemic Stroke Rate (% per year) is equal to 3.2 % stroke rate/year from a score of 3 (HTN, DM, Female). The patient previously only took Eliquis for a month following administration of Flecainide. We had a detailed discussion about stroke prevention and  anticoagulation in the setting of recurrent atrial fibrillation. With her significant family history of CVA's, she wishes to remain on anticoagulation long-term. Will write for annual supply of Eliquis. Informed to monitor for evidence of active bleeding but she has tolerated this in the past with no significant bleeding issues.   2. HTN - BP initially at 152/100 during today's visit, at 144/88 on recheck. She reports having  taken her medications less than 30 minutes prior to arriving to her appointment.   - continue Lisinopril 10mg  daily and Cardizem CD 180mg  daily. She will continue to follow BP at home. If this remains elevated, would recommend further titration of her Lisinopril.   3. Type 2 DM - followed by PCP. She remains on Metformin.    Medication Adjustments/Labs and Tests Ordered: Current medicines are reviewed at length with the patient today.  Concerns regarding medicines are outlined above.  Medication changes, Labs and Tests ordered today are listed in the Patient Instructions below. Patient Instructions  Medication Instructions: Your physician recommends that you continue on your current medications as directed. Please refer to the Current Medication list given to you today.  Labwork: None Ordered  Procedures/Testing: None Ordered  Follow-Up: Your physician wants you to follow-up in: 1 YEAR with Dr. Herbie Baltimore.  You will receive a reminder letter in the mail two months in advance. If you don't receive a letter, please call our office to schedule the follow-up appointment.  If you need a refill on your cardiac medications before your next appointment, please call your pharmacy.     Signed, Ellsworth Lennox, PA-C  05/31/2017 12:20 PM    [facility] [address] [place], [address] [place], [place]  [address] Phone: [phone]; Fax: [phone]  [address] [place], [place] [address] Phone: [phone]

## 2017-05-31 ENCOUNTER — Ambulatory Visit (INDEPENDENT_AMBULATORY_CARE_PROVIDER_SITE_OTHER): Payer: 59 | Admitting: Student

## 2017-05-31 ENCOUNTER — Encounter: Payer: Self-pay | Admitting: Student

## 2017-05-31 VITALS — BP 144/88 | HR 107 | Ht 68.0 in | Wt 258.0 lb

## 2017-05-31 DIAGNOSIS — I480 Paroxysmal atrial fibrillation: Secondary | ICD-10-CM

## 2017-05-31 DIAGNOSIS — E119 Type 2 diabetes mellitus without complications: Secondary | ICD-10-CM

## 2017-05-31 DIAGNOSIS — I10 Essential (primary) hypertension: Secondary | ICD-10-CM | POA: Diagnosis not present

## 2017-05-31 DIAGNOSIS — Z7901 Long term (current) use of anticoagulants: Secondary | ICD-10-CM

## 2017-05-31 MED ORDER — DILTIAZEM HCL ER COATED BEADS 180 MG PO CP24: 180.0000 mg | ORAL_CAPSULE | Freq: Every day | ORAL | 3 refills | Status: DC

## 2017-05-31 MED ORDER — APIXABAN 5 MG PO TABS: 5.0000 mg | ORAL_TABLET | Freq: Two times a day (BID) | ORAL | 3 refills | Status: DC

## 2017-05-31 MED ORDER — LISINOPRIL 10 MG PO TABS: 10.0000 mg | ORAL_TABLET | Freq: Every day | ORAL | 3 refills | Status: DC

## 2017-05-31 NOTE — Patient Instructions (Signed)
Medication Instructions: Your physician recommends that you continue on your current medications as directed. Please refer to the Current Medication list given to you today.  Labwork: None Ordered  Procedures/Testing: None Ordered  Follow-Up: Your physician wants you to follow-up in: 1 YEAR with Dr. Herbie BaltimoreHarding.  You will receive a reminder letter in the mail two months in advance. If you don't receive a letter, please call our office to schedule the follow-up appointment.  If you need a refill on your cardiac medications before your next appointment, please call your pharmacy.

## 2017-06-10 DIAGNOSIS — M48062 Spinal stenosis, lumbar region with neurogenic claudication: Secondary | ICD-10-CM | POA: Diagnosis not present

## 2017-06-10 DIAGNOSIS — M4802 Spinal stenosis, cervical region: Secondary | ICD-10-CM | POA: Diagnosis not present

## 2017-06-22 ENCOUNTER — Other Ambulatory Visit: Payer: Self-pay | Admitting: Neurosurgery

## 2017-06-22 DIAGNOSIS — M48062 Spinal stenosis, lumbar region with neurogenic claudication: Secondary | ICD-10-CM

## 2017-07-17 ENCOUNTER — Other Ambulatory Visit: Payer: Self-pay | Admitting: Cardiology

## 2017-07-18 NOTE — Telephone Encounter (Signed)
REFILL 

## 2017-08-15 ENCOUNTER — Encounter (HOSPITAL_COMMUNITY): Payer: Self-pay | Admitting: Emergency Medicine

## 2017-08-15 ENCOUNTER — Emergency Department (HOSPITAL_COMMUNITY): Payer: 59

## 2017-08-15 ENCOUNTER — Emergency Department (HOSPITAL_COMMUNITY)
Admission: EM | Admit: 2017-08-15 | Discharge: 2017-08-15 | Disposition: A | Discharge status: Home / Self Care | Payer: 59 | Attending: Emergency Medicine | Admitting: Emergency Medicine

## 2017-08-15 DIAGNOSIS — I10 Essential (primary) hypertension: Secondary | ICD-10-CM | POA: Diagnosis not present

## 2017-08-15 DIAGNOSIS — J069 Acute upper respiratory infection, unspecified: Secondary | ICD-10-CM | POA: Insufficient documentation

## 2017-08-15 DIAGNOSIS — Z7984 Long term (current) use of oral hypoglycemic drugs: Secondary | ICD-10-CM | POA: Diagnosis not present

## 2017-08-15 DIAGNOSIS — R739 Hyperglycemia, unspecified: Secondary | ICD-10-CM

## 2017-08-15 DIAGNOSIS — R05 Cough: Secondary | ICD-10-CM | POA: Diagnosis present

## 2017-08-15 DIAGNOSIS — Z7901 Long term (current) use of anticoagulants: Secondary | ICD-10-CM | POA: Insufficient documentation

## 2017-08-15 DIAGNOSIS — R079 Chest pain, unspecified: Secondary | ICD-10-CM | POA: Diagnosis not present

## 2017-08-15 DIAGNOSIS — I4891 Unspecified atrial fibrillation: Secondary | ICD-10-CM | POA: Diagnosis not present

## 2017-08-15 DIAGNOSIS — E1165 Type 2 diabetes mellitus with hyperglycemia: Secondary | ICD-10-CM | POA: Insufficient documentation

## 2017-08-15 DIAGNOSIS — F17210 Nicotine dependence, cigarettes, uncomplicated: Secondary | ICD-10-CM | POA: Insufficient documentation

## 2017-08-15 DIAGNOSIS — B9789 Other viral agents as the cause of diseases classified elsewhere: Secondary | ICD-10-CM

## 2017-08-15 DIAGNOSIS — Z79899 Other long term (current) drug therapy: Secondary | ICD-10-CM | POA: Diagnosis not present

## 2017-08-15 DIAGNOSIS — R0789 Other chest pain: Secondary | ICD-10-CM | POA: Diagnosis not present

## 2017-08-15 LAB — I-STAT BETA HCG BLOOD, ED (MC, WL, AP ONLY): I-stat hCG, quantitative: <5 m[IU]/mL (ref <5)

## 2017-08-15 LAB — BASIC METABOLIC PANEL
Anion gap: 14 (ref 5–15)
BUN: 8 mg/dL (ref 6–20)
CO2: 20 mmol/L — ABNORMAL LOW (ref 22–32)
Calcium: 9.5 mg/dL (ref 8.9–10.3)
Chloride: 102 mmol/L (ref 101–111)
Creatinine, Ser: 0.79 mg/dL (ref 0.44–1.00)
GFR calc Af Amer: >60 mL/min (ref >60)
GFR calc non Af Amer: >60 mL/min (ref >60)
Glucose, Bld: 317 mg/dL — ABNORMAL HIGH (ref 65–99)
Potassium: 3.7 mmol/L (ref 3.5–5.1)
Sodium: 136 mmol/L (ref 135–145)

## 2017-08-15 LAB — CBC
HCT: 42.4 % (ref 36.0–46.0)
Hemoglobin: 14.9 g/dL (ref 12.0–15.0)
MCH: 31.4 pg (ref 26.0–34.0)
MCHC: 35.1 g/dL (ref 30.0–36.0)
MCV: 89.5 fL (ref 78.0–100.0)
Platelets: 263 10*3/uL (ref 150–400)
RBC: 4.74 MIL/uL (ref 3.87–5.11)
RDW: 13.5 % (ref 11.5–15.5)
WBC: 10.1 10*3/uL (ref 4.0–10.5)

## 2017-08-15 LAB — I-STAT TROPONIN, ED: Troponin i, poc: 0 ng/mL (ref 0.00–0.08)

## 2017-08-15 MED ORDER — ALBUTEROL SULFATE HFA 108 (90 BASE) MCG/ACT IN AERS
2.0000 | INHALATION_SPRAY | Freq: Once | RESPIRATORY_TRACT | Status: AC
  Administered 2017-08-15: 2 via RESPIRATORY_TRACT
  Filled 2017-08-15: qty 6.7

## 2017-08-15 MED ORDER — ACETAMINOPHEN 325 MG PO TABS
325.0000 mg | ORAL_TABLET | Freq: Once | ORAL | Status: AC
Start: 2017-08-15 — End: 2017-08-15
  Administered 2017-08-15: 325 mg via ORAL
  Filled 2017-08-15: qty 1

## 2017-08-15 MED ORDER — IBUPROFEN 400 MG PO TABS: 600.0000 mg | ORAL_TABLET | Freq: Once | ORAL | Status: DC

## 2017-08-15 MED ORDER — BENZONATATE 100 MG PO CAPS
100.0000 mg | ORAL_CAPSULE | Freq: Once | ORAL | Status: AC
  Administered 2017-08-15: 100 mg via ORAL
  Filled 2017-08-15: qty 1

## 2017-08-15 MED ORDER — ALBUTEROL SULFATE HFA 108 (90 BASE) MCG/ACT IN AERS: 2.0000 | INHALATION_SPRAY | Freq: Four times a day (QID) | RESPIRATORY_TRACT | 0 refills | Status: DC | PRN

## 2017-08-15 NOTE — ED Notes (Signed)
[facility]

## 2017-08-15 NOTE — ED Notes (Signed)
Pt verbalizes understanding of d/c instructions. Pt received prescriptions. Pt ambulatory at d/c with all belongings.  

## 2017-08-15 NOTE — Discharge Instructions (Signed)
You were seen today for cough.  Likely related to a virus.  Take inhaler as needed for cough and shortness of breath.  Continue over-the-counter medications if they are helpful.  If you develop fever, worsening shortness of breath or cough or any new or worsening symptoms you should be reevaluated.

## 2017-08-15 NOTE — ED Triage Notes (Signed)
Pt reports coughing with chest pain "usually a little after the coughing."

## 2017-08-15 NOTE — ED Provider Notes (Signed)
[facility] Provider Note   CSN: [id number] Arrival date & time: 08/15/17  2143     History   Chief Complaint Chief Complaint  Patient presents with  . Cough    HPI Julia Dickerson is a 42 y.o. female.  HPI  This is a 42 year old female with a history of atrial fibrillation on Eliquis, diabetes, hypertension who presents with cough, congestion, sore throat.  Patient reports 1-2-day history of cough that is productive of clear sputum.  She reports some chest pain over the left side of her chest only with coughing.  She denies any shortness of breath.  She reports sore throat but no difficulty swallowing.  Denies fevers.  Denies any nausea, vomiting, diarrhea, abdominal pain.  Has had sick contacts.  She did get her flu shot this year.  Past Medical History:  Diagnosis Date  . Anxiety   . Atrial fibrillation (HCC)    a. paroxysmal, uses PRN Flecainide  . Depression   . Diabetes mellitus without complication (HCC)   . Dysrhythmia   . GERD (gastroesophageal reflux disease)   . Hypertension   . Obesity   . PCOS (polycystic ovarian syndrome)   . UTI (lower urinary tract infection)     Patient Active Problem List   Diagnosis Date Noted  . Current use of long term anticoagulation 05/31/2017  . Atrial fibrillation (HCC) 05/16/2017  . Atrial fibrillation with RVR (HCC)   . Abnormal EKG 06/24/2016  . Paroxysmal atrial fibrillation (HCC): CHA2DS2Vasc ~3 06/23/2016  . Essential hypertension 06/23/2016  . Major depression, recurrent (HCC) 07/10/2014    Past Surgical History:  Procedure Laterality Date  . DILATION AND CURETTAGE OF UTERUS    . GXT - Graded Exercise Tolerance Test  07/22/2016   Exercise time 6 minutes - 7 METs. Maximum heart rate 146 BPM. - No EKG changes. No arrhythmias or chest pain.: Low risk test.  . TRANSTHORACIC ECHOCARDIOGRAM  06/2016   EF 6570% with mild LVH. Normal wall motion and diastolic function. Mild RAE.  Otherwise normal.  . WISDOM TOOTH EXTRACTION      OB History    Gravida Para Term Preterm AB Living   2       2     SAB TAB Ectopic Multiple Live Births                   Home Medications    Prior to Admission medications   Medication Sig Start Date End Date Taking? Authorizing Provider  albuterol (PROVENTIL HFA;VENTOLIN HFA) 108 (90 Base) MCG/ACT inhaler Inhale 2 puffs into the lungs every 6 (six) hours as needed for wheezing or shortness of breath. 08/15/17   Jaylenn Baiza, Mayer Maskerourtney F, MD  apixaban (ELIQUIS) 5 MG TABS tablet Take 1 tablet (5 mg total) 2 (two) times daily by mouth. 05/31/17   Strader, Lennart PallBrittany M, PA-C  atorvastatin (LIPITOR) 20 MG tablet Take 1 tablet (20 mg total) by mouth daily. 07/13/14   Thermon Leylandavis, Laura A, NP  diltiazem (CARDIZEM CD) 120 MG 24 hr capsule TAKE 1 CAPSULE EVERY DAY 07/18/17   Marykay LexHarding, David W, MD  diltiazem (CARDIZEM) 60 MG tablet Take 1 tablet if needed for palpitations. Can be taken every 6 hours. 05/17/17   Arty Baumgartneroberts, Lindsay B, NP  flecainide (TAMBOCOR) 100 MG tablet Take 2 tablets (200 mg total) by mouth as needed. For A-fib 08/18/16   Marykay LexHarding, David W, MD  glimepiride (AMARYL) 2 MG tablet Take 2 mg by mouth 2 (two)  times daily.     [provider]  lisinopril (PRINIVIL,ZESTRIL) 10 MG tablet Take 1 tablet (10 mg total) daily by mouth. 05/31/17   Iran Ouch, Lennart Pall, PA-C  metFORMIN (GLUCOPHAGE) 1000 MG tablet Take 1 tablet (1,000 mg total) by mouth 2 (two) times daily with a meal. 07/13/14   Thermon Leyland, NP    Family History Family History  Problem Relation Age of Onset  . Heart failure Father   . Heart attack Father     Social History Social History   Tobacco Use  . Smoking status: Current Every Day Smoker    Packs/day: 0.50    Years: 21.00    Pack years: 10.50    Types: Cigarettes, E-cigarettes  . Smokeless tobacco: Never Used  . Tobacco comment: vapor  Substance Use Topics  . Alcohol use: Yes    Comment: rare  . Drug use: No      Allergies   Sulfa antibiotics   Review of Systems Review of Systems  Constitutional: Negative for fever.  HENT: Positive for congestion and sore throat. Negative for trouble swallowing.   Respiratory: Positive for cough.   Cardiovascular: Positive for chest pain. Negative for leg swelling.  Gastrointestinal: Negative for abdominal pain, nausea and vomiting.  Genitourinary: Negative for dysuria.  Skin: Negative for rash.  All other systems reviewed and are negative.    Physical Exam Updated Vital Signs BP (!) 149/91 (BP Location: Left Arm)   Pulse 98   Temp 98.9 F (37.2 C) (Oral)   Resp 18   Ht 5\' 8"  (1.727 m)   Wt 117.9 kg (260 lb)   LMP 04/25/2017 (Within Weeks)   SpO2 99%   BMI 39.53 kg/m   Physical Exam  Constitutional: She is oriented to person, place, and time. She appears well-developed and well-nourished.  Obese, no acute distress  HENT:  Head: Normocephalic and atraumatic.  Mouth/Throat: No oropharyngeal exudate.  Mild erythema posterior oropharynx, postnasal drip noted  Eyes: Conjunctivae are normal. Pupils are equal, round, and reactive to light.  Neck: Neck supple.  Cardiovascular: Normal rate, regular rhythm and normal heart sounds.  No murmur heard. Pulmonary/Chest: Effort normal and breath sounds normal. No respiratory distress. She has no wheezes. She has no rales.  Abdominal: Soft. Bowel sounds are normal. There is no tenderness.  Lymphadenopathy:    She has no cervical adenopathy.  Neurological: She is alert and oriented to person, place, and time.  Skin: Skin is warm and dry.  Psychiatric: She has a normal mood and affect.  Nursing note and vitals reviewed.    ED Treatments / Results  Labs (all labs ordered are listed, but only abnormal results are displayed) Labs Reviewed  BASIC METABOLIC PANEL - Abnormal; Notable for the following components:      Result Value   CO2 20 (*)    Glucose, Bld 317 (*)    All other components within  normal limits  CBC  I-STAT TROPONIN, ED  I-STAT BETA HCG BLOOD, ED ([facility], [facility], [facility] ONLY)    EKG  EKG Interpretation  Date/Time:  Monday August 15 2017 22:14:19 EST Ventricular Rate:  97 PR Interval:  138 QRS Duration: 82 QT Interval:  346 QTC Calculation: 439 R Axis:   65 Text Interpretation:  Poor data quality, interpretation may be adversely affected Normal sinus rhythm Septal infarct , age undetermined Abnormal ECG No significant change since last tracing Confirmed by Ross Marcus ([id number]) on 08/15/2017 11:11:39 PM  Radiology Dg Chest 2 View  Result Date: 08/15/2017 CLINICAL DATA:  Chest pain EXAM: CHEST  2 VIEW COMPARISON:  None. FINDINGS: The heart size and mediastinal contours are within normal limits. Both lungs are clear. The visualized skeletal structures are unremarkable. IMPRESSION: No active cardiopulmonary disease. Electronically Signed   By: Deatra Robinson M.D.   On: 08/15/2017 22:32    Procedures Procedures (including critical care time)  Medications Ordered in ED Medications  benzonatate (TESSALON) capsule 100 mg (not administered)  albuterol (PROVENTIL HFA;VENTOLIN HFA) 108 (90 Base) MCG/ACT inhaler 2 puff (not administered)  acetaminophen (TYLENOL) tablet 325 mg (not administered)     Initial Impression / Assessment and Plan / ED Course  I have reviewed the triage vital signs and the nursing notes.  Pertinent labs & imaging results that were available during my care of the patient were reviewed by me and considered in my medical decision making (see chart for details).     Patient presents with cough and upper respiratory symptoms.  She is nontoxic on exam.  Vital signs reassuring.  Mildly hypertensive.  Lab work and EKG reviewed from triage.  Lab work notable for hyperglycemia without evidence of anion gap.  Otherwise negative troponin and EKG without any evidence of ischemia.  Suspect viral URI.  Chest x-ray shows no suggestion of pneumonia.  The  patient is a smoker.  While she is not having any wheezing at this time, she may benefit from a inhaler given propensity of smokers to bronchospasm.  Otherwise recommend supportive measures.  After history, exam, and medical workup I feel the patient has been appropriately medically screened and is safe for discharge home. Pertinent diagnoses were discussed with the patient. Patient was given return precautions.   Final Clinical Impressions(s) / ED Diagnoses   Final diagnoses:  Viral URI with cough  Hyperglycemia    ED Discharge Orders        Ordered    albuterol (PROVENTIL HFA;VENTOLIN HFA) 108 (90 Base) MCG/ACT inhaler  Every 6 hours PRN     08/15/17 2339       Shon Baton, MD 08/15/17 2350

## 2017-08-17 ENCOUNTER — Inpatient Hospital Stay
Admission: RE | Admit: 2017-08-17 | Discharge: 2017-08-17 | Disposition: A | Discharge status: Home / Self Care | Payer: Self-pay | Source: Ambulatory Visit | Attending: Neurosurgery | Admitting: Neurosurgery

## 2017-08-19 ENCOUNTER — Encounter: Payer: Self-pay | Admitting: Family Medicine

## 2017-08-19 ENCOUNTER — Ambulatory Visit: Payer: 59 | Admitting: Family Medicine

## 2017-08-19 ENCOUNTER — Telehealth: Payer: Self-pay

## 2017-08-19 VITALS — BP 126/82 | HR 96 | Temp 98.4°F | Resp 12 | Ht 68.0 in | Wt 254.1 lb

## 2017-08-19 DIAGNOSIS — I480 Paroxysmal atrial fibrillation: Secondary | ICD-10-CM

## 2017-08-19 DIAGNOSIS — E1169 Type 2 diabetes mellitus with other specified complication: Secondary | ICD-10-CM | POA: Insufficient documentation

## 2017-08-19 DIAGNOSIS — E785 Hyperlipidemia, unspecified: Secondary | ICD-10-CM | POA: Insufficient documentation

## 2017-08-19 DIAGNOSIS — N912 Amenorrhea, unspecified: Secondary | ICD-10-CM

## 2017-08-19 DIAGNOSIS — G629 Polyneuropathy, unspecified: Secondary | ICD-10-CM

## 2017-08-19 DIAGNOSIS — I10 Essential (primary) hypertension: Secondary | ICD-10-CM

## 2017-08-19 DIAGNOSIS — F17200 Nicotine dependence, unspecified, uncomplicated: Secondary | ICD-10-CM | POA: Diagnosis not present

## 2017-08-19 DIAGNOSIS — E1140 Type 2 diabetes mellitus with diabetic neuropathy, unspecified: Secondary | ICD-10-CM | POA: Insufficient documentation

## 2017-08-19 LAB — POCT GLYCOSYLATED HEMOGLOBIN (HGB A1C): Hemoglobin A1C: 8.7

## 2017-08-19 MED ORDER — NICOTINE 21 MG/24HR TD PT24: 21.0000 mg | MEDICATED_PATCH | Freq: Every day | TRANSDERMAL | 0 refills | Status: DC

## 2017-08-19 MED ORDER — LIRAGLUTIDE 18 MG/3ML ~~LOC~~ SOPN: 1.8000 mg | PEN_INJECTOR | Freq: Every day | SUBCUTANEOUS | 3 refills | Status: DC

## 2017-08-19 MED ORDER — ATORVASTATIN CALCIUM 20 MG PO TABS: 20.0000 mg | ORAL_TABLET | Freq: Every day | ORAL | 1 refills | Status: DC

## 2017-08-19 MED ORDER — DULOXETINE HCL 30 MG PO CPEP: 30.0000 mg | ORAL_CAPSULE | Freq: Every day | ORAL | 1 refills | Status: DC

## 2017-08-19 MED ORDER — METFORMIN HCL 1000 MG PO TABS: 1000.0000 mg | ORAL_TABLET | Freq: Two times a day (BID) | ORAL | 1 refills | Status: DC

## 2017-08-19 MED ORDER — GLIMEPIRIDE 2 MG PO TABS: 2.0000 mg | ORAL_TABLET | Freq: Two times a day (BID) | ORAL | 1 refills | Status: DC

## 2017-08-19 NOTE — Assessment & Plan Note (Addendum)
Adequately controlled. No changes in current management. DASH-Low-salt diet recommended. Eye exam recommended annually. F/U in 6 months, before if needed.  

## 2017-08-19 NOTE — Telephone Encounter (Signed)
She can take Victoza at any time during the day but around same time every day. Thanks, BJ

## 2017-08-19 NOTE — Patient Instructions (Addendum)
A few things to remember from today's visit:   Essential hypertension  Type 2 diabetes mellitus with diabetic neuropathy, without long-term current use of insulin (HCC) - Plan: Microalbumin / creatinine urine ratio, POCT glycosylated hemoglobin (Hb A1C)  Hyperlipidemia associated with type 2 diabetes mellitus (HCC) - Plan: Lipid panel  Paroxysmal atrial fibrillation (HCC): CHA2DS2Vasc ~3  Peripheral polyneuropathy - Plan: DULoxetine (CYMBALTA) 30 MG capsule  Victoza added today, start 0.6 mg on in 2-3 weeks increase to 1.2 and then 1.8. Please be sure medication list is accurate. If a new problem present, please set up appointment sooner than planned today.

## 2017-08-19 NOTE — Assessment & Plan Note (Addendum)
Poorly controlled She agrees with adding Victoza, starting with 0.6 mg and titrating dose to 1.8 mg, changing dose every 2-3 weeks as tolerated. No changes on Metformin or Glimepiride.  regular exercise and healthy diet with avoidance of added sugar food intake is an important part of treatment and recommended. Annual eye exam (overdue), periodic dental and foot care recommended. F/U in 4 months

## 2017-08-19 NOTE — Telephone Encounter (Signed)
Pt is calling to ask if she should be taking the Victoza with the Metformin or at separate times.   Dr. SwazilandJordan - Please advise. Thanks!

## 2017-08-19 NOTE — Assessment & Plan Note (Signed)
No changes in current dose of Atorvastatin. Low-fat diet also recommended. She will be back next week for fasting labs and further recommendation will be given according.

## 2017-08-19 NOTE — Assessment & Plan Note (Signed)
Rate and rhythm control. No changes in the Diltiazem or Eliquis. Continue following with Dr.Harding.

## 2017-08-19 NOTE — Telephone Encounter (Signed)
Pt notified of instructions and verbalized understanding. 

## 2017-08-19 NOTE — Progress Notes (Signed)
HPI:   Julia Dickerson is a 42 y.o. female, who is here today to establish care.  Former PCP: Dr Pearline Cables Last preventive routine visit: 2015  Chronic medical problems: Atrial fib, DM 2, depression, back pain, GERD, and hypertension among some.  GERD: She is currently on Omeprazole 20 mg daily.  Denies abdominal pain, nausea, vomiting, changes in bowel habits, blood in stool or melena.  Paroxysmal atrial fibrillation, she states that she usually has episodes of atrial fibrillation between September and November every year.  She is not aware of exacerbating factors. Currently she is on Diltiazem and Eliquis. She follows with cardiologist.  HTN: Dx 10+ years ago. She is currently on Lisinopril 10 mg daily. Denies severe/frequent headache, visual changes, chest pain, dyspnea, palpitation, focal weakness, or edema.  Diabetes mellitus 2, diagnosed about 15 years ago.  She has not been consistent with a healthy diet and regular exercise. Currently she is on Glimepiride 2 mg once daily and Metformin ER 1000 mg twice daily.  Last eye exam about 2 years ago.  She was on insulin a few years ago. She is not checking BS regularly. Last hemoglobin A1c about 6 months ago, she does not recall the number.  HLD:  She is currently on Atorvastatin 20 mg daily. She has not been consistent with low fat diet. She is tolerating medication well.  History of lower back pain with radiculopathy, pain is radiated to both lower extremities, down lateral aspect of thighs.  She follows with neurosurgery, Dr. Annette Stable. According to patient, she has an lumbar MRI scheduled for next week.  Right shoulder pain radiated to right upper extremity down to her hand. Decreased sensation, which according to patient, did not resolve after she had cervical spine surgery,stable.  Concerns today: Neuropathic pain.  Currently she is not on treatment. She has no noted skin lesions. Bilateral feet numbness,  constant.  Intermittent burning sensation and shooting pain on medial aspect of feet. Pain interferes with her sleep.  +Fatigue.  She is also interested in smoking cessation, her husband is also planning on quitting. She is not interested in trying Chantix because she is afraid of worsening depression. She has not try any medication in the past. History of depression and anxiety, she denies suicidal thoughts. Trouble falling and staying asleep, she sleeps about 2-3 hours "max 4 hours" per night.   + Nocturia x 3-4.     Review of Systems  Constitutional: Positive for fatigue. Negative for activity change, appetite change and fever.  HENT: Negative for mouth sores, nosebleeds and trouble swallowing.   Eyes: Negative for redness and visual disturbance.  Respiratory: Negative for cough, shortness of breath and wheezing.   Cardiovascular: Negative for chest pain, palpitations and leg swelling.  Gastrointestinal: Negative for abdominal pain, nausea and vomiting.       Negative for changes in bowel habits.  Endocrine: Positive for polyuria. Negative for cold intolerance, heat intolerance, polydipsia and polyphagia.  Genitourinary: Negative for decreased urine volume, dysuria and hematuria.  Musculoskeletal: Positive for arthralgias, back pain and neck pain. Negative for gait problem.  Skin: Negative for rash and wound.  Neurological: Positive for numbness. Negative for syncope, weakness and headaches.  Psychiatric/Behavioral: Negative for confusion. The patient is nervous/anxious.       Current Outpatient Medications on File Prior to Visit  Medication Sig Dispense Refill  . albuterol (PROVENTIL HFA;VENTOLIN HFA) 108 (90 Base) MCG/ACT inhaler Inhale 2 puffs into the lungs every 6 (six)  hours as needed for wheezing or shortness of breath. 1 Inhaler 0  . apixaban (ELIQUIS) 5 MG TABS tablet Take 1 tablet (5 mg total) 2 (two) times daily by mouth. 180 tablet 3  . Blood Glucose Monitoring  Suppl (FIFTY50 GLUCOSE METER 2.0) w/Device KIT     . diltiazem (CARDIZEM CD) 120 MG 24 hr capsule TAKE 1 CAPSULE EVERY DAY (Patient taking differently: as needed) 90 capsule 1  . diltiazem (CARDIZEM CD) 180 MG 24 hr capsule     . flecainide (TAMBOCOR) 100 MG tablet Take 2 tablets (200 mg total) by mouth as needed. For A-fib 10 tablet 6  . lisinopril (PRINIVIL,ZESTRIL) 10 MG tablet Take 1 tablet (10 mg total) daily by mouth. 90 tablet 3  . omeprazole (PRILOSEC) 20 MG capsule     . diltiazem (CARDIZEM) 60 MG tablet Take 1 tablet if needed for palpitations. Can be taken every 6 hours. (Patient not taking: Reported on 08/19/2017) 20 tablet 0   No current facility-administered medications on file prior to visit.      Past Medical History:  Diagnosis Date  . Anxiety   . Arthritis   . Atrial fibrillation (HCC)    a. paroxysmal, uses PRN Flecainide  . Depression   . Diabetes mellitus without complication ([place])   . Dysrhythmia   . GERD (gastroesophageal reflux disease)   . Hypertension   . Obesity   . PCOS (polycystic ovarian syndrome)   . UTI (lower urinary tract infection)    Allergies  Allergen Reactions  . Sulfa Antibiotics Hives    Family History  Problem Relation Age of Onset  . Heart failure Father   . Heart attack Father   . Cancer Father   . Diabetes Father   . Hearing loss Father   . Hyperlipidemia Father   . Hypertension Father   . Arthritis Mother   . Diabetes Mother   . COPD Maternal Grandmother   . Heart disease Maternal Grandmother   . Hyperlipidemia Maternal Grandmother   . Hyperlipidemia Maternal Grandfather   . Hypertension Maternal Grandfather   . Hearing loss Maternal Grandfather   . Alcohol abuse Maternal Grandfather   . Arthritis Maternal Grandfather   . Stroke Maternal Grandfather   . Hyperlipidemia Paternal Grandmother   . Miscarriages / Stillbirths Paternal Grandmother   . Kidney disease Paternal Grandfather   . Hypertension Paternal Grandfather     . Heart attack Paternal Grandfather   . Diabetes Paternal Grandfather   . Arthritis Sister     Social History   Socioeconomic History  . Marital status: Married    Spouse name: None  . Number of children: 0  . Years of education: None  . Highest education level: None  Social Needs  . Financial resource strain: None  . Food insecurity - worry: None  . Food insecurity - inability: None  . Transportation needs - medical: None  . Transportation needs - non-medical: None  Occupational History  . None  Tobacco Use  . Smoking status: Current Every Day Smoker    Packs/day: 0.50    Years: 21.00    Pack years: 10.50    Types: Cigarettes, E-cigarettes  . Smokeless tobacco: Never Used  . Tobacco comment: vapor  Substance and Sexual Activity  . Alcohol use: Yes    Comment: rare  . Drug use: No  . Sexual activity: Yes    Partners: Male    Birth control/protection: None  Other Topics Concern  . None  Social  History Narrative  . None    Vitals:   08/19/17 1410  BP: 126/82  Pulse: 96  Resp: 12  Temp: 98.4 F (36.9 C)  SpO2: 98%    Body mass index is 38.64 kg/m.   Physical Exam  Nursing note and vitals reviewed. Constitutional: She is oriented to person, place, and time. She appears well-developed. No distress.  HENT:  Head: Normocephalic and atraumatic.  Mouth/Throat: Oropharynx is clear and moist and mucous membranes are normal.  Eyes: Conjunctivae and EOM are normal. Pupils are equal, round, and reactive to light.  Cardiovascular: Normal rate and regular rhythm.  No murmur heard. Pulses:      Dorsalis pedis pulses are 2+ on the right side, and 2+ on the left side.  Respiratory: Effort normal and breath sounds normal. No respiratory distress.  GI: Soft. She exhibits no mass. There is no hepatomegaly. There is no tenderness.  Musculoskeletal: She exhibits no edema.  Right shoulder minimal limitation of active range of motion, it elicits pain. Tenderness upon  palpation of palpation trapezium bilateral, mild muscle spasm.  Lymphadenopathy:    She has no cervical adenopathy.  Neurological: She is alert and oriented to person, place, and time. She has normal strength. Coordination normal.  Skin: Skin is warm. No erythema.  Psychiatric: Her mood appears anxious.  Fairly groomed, good eye contact.   Diabetic Foot Exam - Simple   Simple Foot Form Diabetic Foot exam was performed with the following findings:  Yes 08/19/2017  4:11 PM  Visual Inspection Sensation Testing Pulse Check Comments Pronounce arch bilateral. Monofilament absent bilateral. Peripheral pulses present (DP). No ulcers/excoriations  No calluses No hypertrophic/long toenails.       ASSESSMENT AND PLAN:   Julia Dickerson was seen today for establish care.  Diagnoses and all orders for this visit:  Type 2 diabetes mellitus with diabetic neuropathy, without long-term current use of insulin (HCC) -     Microalbumin / creatinine urine ratio; Future -     POCT glycosylated hemoglobin (Hb A1C) -     liraglutide (VICTOZA) 18 MG/3ML SOPN; Inject 0.3 mLs (1.8 mg total) into the skin daily. -     metFORMIN (GLUCOPHAGE) 1000 MG tablet; Take 1 tablet (1,000 mg total) by mouth 2 (two) times daily with a meal. -     glimepiride (AMARYL) 2 MG tablet; Take 1 tablet (2 mg total) by mouth 2 (two) times daily.  Essential hypertension  Hyperlipidemia associated with type 2 diabetes mellitus ([place]) -     Lipid panel; Future -     atorvastatin (LIPITOR) 20 MG tablet; Take 1 tablet (20 mg total) by mouth daily.  Peripheral polyneuropathy -     DULoxetine (CYMBALTA) 30 MG capsule; Take 1 capsule (30 mg total) by mouth daily.  Tobacco use disorder -     nicotine (NICODERM CQ - DOSED IN MG/24 HOURS) 21 mg/24hr patch; Place 1 patch (21 mg total) onto the skin daily.  Paroxysmal atrial fibrillation ([place]): CHA2DS2Vasc ~3  Amenorrhea -     POCT urine pregnancy; Future   Lab Results  Component  Value Date   HGBA1C 8.7 08/19/2017    Victoza added today, it may also help with weight loss. Strongly recommend engaging in some type of regular physical activity and to follow a healthier diet. She will continue following with cardiologist for atrial fibrillation. LMP in 04/2017, Hx of irregular menses. She will continue following with neurosurgeon for her radicular lower back pain. Right shoulder pain, chronic, and  seems to be related to cervical radicular symptoms. She is coming next week for fasting labs.     Betty G. Martinique, MD  [facility]. [place] office.

## 2017-08-19 NOTE — Assessment & Plan Note (Signed)
Adverse effects of tobacco use discussed as well as benefits.   After discussion of pharmacologic treatment options, she agrees with Nicotine patches 21 mg daily for 7 weeks.  Then she will continue 14 mg and 7 mg. We discussed some side effects.

## 2017-08-22 ENCOUNTER — Telehealth: Payer: Self-pay | Admitting: Family Medicine

## 2017-08-22 ENCOUNTER — Other Ambulatory Visit (INDEPENDENT_AMBULATORY_CARE_PROVIDER_SITE_OTHER): Payer: 59

## 2017-08-22 DIAGNOSIS — E785 Hyperlipidemia, unspecified: Secondary | ICD-10-CM

## 2017-08-22 DIAGNOSIS — N912 Amenorrhea, unspecified: Secondary | ICD-10-CM | POA: Diagnosis not present

## 2017-08-22 DIAGNOSIS — E1169 Type 2 diabetes mellitus with other specified complication: Secondary | ICD-10-CM

## 2017-08-22 DIAGNOSIS — E1140 Type 2 diabetes mellitus with diabetic neuropathy, unspecified: Secondary | ICD-10-CM

## 2017-08-22 DIAGNOSIS — M48062 Spinal stenosis, lumbar region with neurogenic claudication: Secondary | ICD-10-CM

## 2017-08-22 LAB — LIPID PANEL
Cholesterol: 204 mg/dL — ABNORMAL HIGH (ref 0–200)
HDL: 27.6 mg/dL — ABNORMAL LOW (ref >39.00)
NonHDL: 176.87
Total CHOL/HDL Ratio: 7
Triglycerides: 302 mg/dL — ABNORMAL HIGH (ref 0.0–149.0)
VLDL: 60.4 mg/dL — ABNORMAL HIGH (ref 0.0–40.0)

## 2017-08-22 LAB — POCT URINE PREGNANCY: Preg Test, Ur: NEGATIVE

## 2017-08-22 LAB — MICROALBUMIN / CREATININE URINE RATIO
Creatinine,U: 169.5 mg/dL
Microalb Creat Ratio: 178.5 mg/g — ABNORMAL HIGH (ref 0.0–30.0)
Microalb, Ur: 302.7 mg/dL — ABNORMAL HIGH (ref 0.0–1.9)

## 2017-08-22 LAB — LDL CHOLESTEROL, DIRECT: Direct LDL: 140 mg/dL

## 2017-08-22 MED ORDER — INSULIN PEN NEEDLE 32G X 4 MM MISC: 3 refills | Status: DC

## 2017-08-22 NOTE — Telephone Encounter (Signed)
The patient was seen Friday and the patient needles for her insulin pen wasn't sent in to the pharmacy. The patient needs her needles sent to:  [id number], [place] - [address] [phone] (Phone) [phone] (Fax)

## 2017-08-22 NOTE — Telephone Encounter (Signed)
This is not Dr. Salomon FickBanks patient this is a Dr. Swazil[place]Jordan patient. Is someone able to change the PCP name? Please and thank you.

## 2017-08-22 NOTE — Telephone Encounter (Signed)
Medication filled to pharmacy as requested.   

## 2017-08-24 ENCOUNTER — Telehealth: Payer: Self-pay | Admitting: Family Medicine

## 2017-08-24 NOTE — Telephone Encounter (Unsigned)
Copied from CRM [phone]. Topic: Inquiry >> Aug 24, 2017  2:02 PM Julia Dickerson, Teresa G wrote: Pt is needing a call back to discuss her medications.  She is uncertain on what she should or should not continue to take.

## 2017-08-25 NOTE — Telephone Encounter (Signed)
Left message for patient to give clinic a call. Patient can schedule an appointment to discuss medications with PCP.

## 2017-08-26 ENCOUNTER — Ambulatory Visit
Admission: RE | Admit: 2017-08-26 | Discharge: 2017-08-26 | Disposition: A | Discharge status: Home / Self Care | Payer: 59 | Source: Ambulatory Visit | Attending: Neurosurgery | Admitting: Neurosurgery

## 2017-08-26 DIAGNOSIS — M48061 Spinal stenosis, lumbar region without neurogenic claudication: Secondary | ICD-10-CM | POA: Diagnosis not present

## 2017-08-28 NOTE — Progress Notes (Signed)
HPI:  Chief Complaint  Patient presents with  . Discuss medications    wants to be sure she is taking correct medications and get refills, have to be 3 month supply    Julia Dickerson is a 42 y.o. female, who is here today to follow on recent OV.  She want to go through all her medications and be sure 3 months supply is sent to her pharmacy.   She was seen on 08/19/17, when Victoza was added for DM II. She is also on Metformin 1000 mg bid and Amaryl 2 mg bid. She is not checking BS's.   Lab Results  Component Value Date   HGBA1C 8.7 08/19/2017   No side effects from Victoza. Not checking BS's.  HLD:  She also would like to go through [place] results. Lipitor increased from 20 mg to 40 mg daily. She is planning on being more compliant with her diet.   Lab Results  Component Value Date   CHOL 204 (H) 08/22/2017   HDL 27.60 (L) 08/22/2017   LDLDIRECT 140.0 08/22/2017   TRIG 302.0 (H) 08/22/2017   CHOLHDL 7 08/22/2017   Still smoking,waiting oin her husband to get Nicotine also,so they both can quit at the same time.  HTN:  Denies severe/frequent headache, visual changes, chest pain, dyspnea, palpitation, focal weakness, or edema.  Lab Results  Component Value Date   CREATININE 0.79 08/15/2017   BUN 8 08/15/2017   NA 136 08/15/2017   K 3.7 08/15/2017   CL 102 08/15/2017   CO2 20 (L) 08/15/2017    Review of Systems  Constitutional: Positive for fatigue. Negative for chills and fever.  HENT: Negative for mouth sores and sore throat.   Respiratory: Negative for shortness of breath and wheezing.   Cardiovascular: Negative for chest pain.  Gastrointestinal: Negative for abdominal pain, nausea and vomiting.  Endocrine: Negative for polydipsia and polyphagia.  Genitourinary: Negative for decreased urine volume and hematuria.  Musculoskeletal: Negative for gait problem.  Skin: Negative for rash and wound.  Neurological: Negative for weakness and  headaches.  Psychiatric/Behavioral: Negative for confusion. The patient is nervous/anxious.       Current Outpatient Medications on File Prior to Visit  Medication Sig Dispense Refill  . albuterol (PROVENTIL HFA;VENTOLIN HFA) 108 (90 Base) MCG/ACT inhaler Inhale 2 puffs into the lungs every 6 (six) hours as needed for wheezing or shortness of breath. 1 Inhaler 0  . apixaban (ELIQUIS) 5 MG TABS tablet Take 1 tablet (5 mg total) 2 (two) times daily by mouth. 180 tablet 3  . Blood Glucose Monitoring Suppl (FIFTY50 GLUCOSE METER 2.0) w/Device KIT     . diltiazem (CARDIZEM CD) 120 MG 24 hr capsule TAKE 1 CAPSULE EVERY DAY (Patient taking differently: as needed) 90 capsule 1  . diltiazem (CARDIZEM CD) 180 MG 24 hr capsule     . diltiazem (CARDIZEM) 60 MG tablet Take 1 tablet if needed for palpitations. Can be taken every 6 hours. 20 tablet 0  . DULoxetine (CYMBALTA) 30 MG capsule Take 1 capsule (30 mg total) by mouth daily. 30 capsule 1  . flecainide (TAMBOCOR) 100 MG tablet Take 2 tablets (200 mg total) by mouth as needed. For A-fib 10 tablet 6  . glimepiride (AMARYL) 2 MG tablet Take 1 tablet (2 mg total) by mouth 2 (two) times daily. 180 tablet 1  . Insulin Pen Needle 32G X 4 MM MISC Use daily with insulin pen. 100 each 3  .  liraglutide (VICTOZA) 18 MG/3ML SOPN Inject 0.3 mLs (1.8 mg total) into the skin daily. 9 mL 3  . nicotine (NICODERM CQ - DOSED IN MG/24 HOURS) 21 mg/24hr patch Place 1 patch (21 mg total) onto the skin daily. 45 patch 0  . omeprazole (PRILOSEC) 20 MG capsule      No current facility-administered medications on file prior to visit.      Past Medical History:  Diagnosis Date  . Anxiety   . Arthritis   . Atrial fibrillation (HCC)    a. paroxysmal, uses PRN Flecainide  . Depression   . Diabetes mellitus without complication ([place])   . Dysrhythmia   . GERD (gastroesophageal reflux disease)   . Hypertension   . Obesity   . PCOS (polycystic ovarian syndrome)   . UTI  (lower urinary tract infection)    Allergies  Allergen Reactions  . Sulfa Antibiotics Hives    Social History   Socioeconomic History  . Marital status: Married    Spouse name: None  . Number of children: 0  . Years of education: None  . Highest education level: None  Social Needs  . Financial resource strain: None  . Food insecurity - worry: None  . Food insecurity - inability: None  . Transportation needs - medical: None  . Transportation needs - non-medical: None  Occupational History  . None  Tobacco Use  . Smoking status: Current Every Day Smoker    Packs/day: 0.50    Years: 21.00    Pack years: 10.50    Types: Cigarettes, E-cigarettes  . Smokeless tobacco: Never Used  . Tobacco comment: vapor  Substance and Sexual Activity  . Alcohol use: Yes    Comment: rare  . Drug use: No  . Sexual activity: Yes    Partners: Male    Birth control/protection: None  Other Topics Concern  . None  Social History Narrative  . None    Vitals:   08/29/17 1048  BP: 130/82  Pulse: 97  Resp: 12  Temp: 98.3 F (36.8 C)  SpO2: 98%   Body mass index is 38.32 kg/m.   Physical Exam  Nursing note and vitals reviewed. Constitutional: She is oriented to person, place, and time. She appears well-developed. No distress.  HENT:  Head: Normocephalic and atraumatic.  Mouth/Throat: Mucous membranes are normal.  Eyes: Conjunctivae are normal. Pupils are equal, round, and reactive to light.  Cardiovascular: Normal rate and regular rhythm.  No murmur heard. Pulses:      Dorsalis pedis pulses are 2+ on the right side, and 2+ on the left side.  Respiratory: Effort normal and breath sounds normal. No respiratory distress.  Musculoskeletal: She exhibits no edema.  Lymphadenopathy:    She has no cervical adenopathy.  Neurological: She is alert and oriented to person, place, and time. She has normal strength. Gait normal.  Skin: Skin is warm. No erythema.  Psychiatric: Her mood  appears anxious.  Fairly groomed, good eye contact.    ASSESSMENT AND PLAN:   Julia Dickerson was seen today for discuss medications.  Diagnoses and all orders for this visit:  Type 2 diabetes mellitus with diabetic neuropathy, without long-term current use of insulin ([place])  We reviewed medication, clarify instructions. Side effects of Victoza also discussed. She is due for eye exam. F/U in 3-4 months.  -     METFORMIN (GLUCOPHAGE) 1000 MG tablet; Take 1 tablet (1,000 mg total) by mouth 2 (two) times daily with a meal.  Essential  hypertension  Adequately controlled. No changes in current medications. Low salt diet recommended.  -     lisinopril (PRINIVIL,ZESTRIL) 10 MG tablet; Take 1 tablet (10 mg total) by mouth daily.   Hyperlipidemia associated with type 2 diabetes mellitus (HCC) Atorvastatin increased from 20 mg to 40 mg. Low fat diet. F/U 4-6 months.    Smoking cessation encouraged. Strongly recommend engaging in a healthier diet and regular physical activity as tolerated.      Tyrah Broers G. Martinique, MD  [facility]. [place] office.

## 2017-08-29 ENCOUNTER — Ambulatory Visit: Payer: 59 | Admitting: Family Medicine

## 2017-08-29 ENCOUNTER — Encounter: Payer: Self-pay | Admitting: Family Medicine

## 2017-08-29 VITALS — BP 130/82 | HR 97 | Temp 98.3°F | Resp 12 | Ht 68.0 in | Wt 252.0 lb

## 2017-08-29 DIAGNOSIS — I10 Essential (primary) hypertension: Secondary | ICD-10-CM

## 2017-08-29 DIAGNOSIS — E1140 Type 2 diabetes mellitus with diabetic neuropathy, unspecified: Secondary | ICD-10-CM

## 2017-08-29 DIAGNOSIS — E785 Hyperlipidemia, unspecified: Secondary | ICD-10-CM

## 2017-08-29 DIAGNOSIS — E1169 Type 2 diabetes mellitus with other specified complication: Secondary | ICD-10-CM | POA: Diagnosis not present

## 2017-08-29 MED ORDER — LISINOPRIL 10 MG PO TABS: 10.0000 mg | ORAL_TABLET | Freq: Every day | ORAL | 2 refills | Status: DC

## 2017-08-29 MED ORDER — ATORVASTATIN CALCIUM 40 MG PO TABS: 40.0000 mg | ORAL_TABLET | Freq: Every day | ORAL | 2 refills | Status: DC

## 2017-08-29 MED ORDER — METFORMIN HCL 1000 MG PO TABS: 1000.0000 mg | ORAL_TABLET | Freq: Two times a day (BID) | ORAL | 1 refills | Status: DC

## 2017-08-29 NOTE — Assessment & Plan Note (Signed)
Atorvastatin increased from 20 mg to 40 mg. Low fat diet. F/U 4-6 months.

## 2017-08-29 NOTE — Telephone Encounter (Signed)
Patient had appointment on 08/29/17.

## 2017-08-29 NOTE — Patient Instructions (Addendum)
A few things to remember from today's visit:   Type 2 diabetes mellitus with diabetic neuropathy, without long-term current use of insulin (HCC) - Plan: metFORMIN (GLUCOPHAGE) 1000 MG tablet  Hyperlipidemia associated with type 2 diabetes mellitus (HCC)   Please be sure medication list is accurate. If a new problem present, please set up appointment sooner than planned today.

## 2017-09-01 ENCOUNTER — Telehealth: Payer: Self-pay | Admitting: Family Medicine

## 2017-09-01 DIAGNOSIS — E1140 Type 2 diabetes mellitus with diabetic neuropathy, unspecified: Secondary | ICD-10-CM

## 2017-09-01 MED ORDER — METFORMIN HCL 1000 MG PO TABS: 1000.0000 mg | ORAL_TABLET | Freq: Two times a day (BID) | ORAL | 1 refills | Status: DC

## 2017-09-01 NOTE — Telephone Encounter (Signed)
[facility]ley states a prescription for Metformin was never received on 08/19/17. [facility]

## 2017-09-01 NOTE — Telephone Encounter (Signed)
Copied from CRM [id number]. Topic: Quick Communication - Rx Refill/Question >> Sep 01, 2017 11:49 AM Stephannie LiSimmons, Shakura Cowing L, NT wrote: Medication:metformin 1000mg   Has the patient contacted their pharmacy? {yes  (Agent: If no, request that the patient contact the pharmacy for the refill.) Preferred Pharmacy (with phone number [facility] [address] [phone] Agent: Please be advised that RX refills may take up to 3 business days. We ask that you follow-up with your pharmacy. Please resend pharmacy did not recieve

## 2017-09-13 ENCOUNTER — Telehealth: Payer: Self-pay | Admitting: Family Medicine

## 2017-09-13 NOTE — Telephone Encounter (Signed)
Left message to give clinic a call back concerning medication. 

## 2017-09-13 NOTE — Telephone Encounter (Signed)
Copied from CRM [phone]. Topic: Quick Communication - See Telephone Encounter >> Sep 13, 2017 12:04 PM Arlyss Gandyichardson, Josilyn Shippee N, NT wrote: CRM for notification. See Telephone encounter for: Pt states she is getting some of the side effects from taking the  liraglutide (VICTOZA) . She states she cannot afford to have the side effects, which are nausea and runny stools because she needs to work. She wants to see if something else can be prescribed in its place that may be a little less harsh on her stomach. [facility] [address]  09/13/17.

## 2017-09-13 NOTE — Telephone Encounter (Signed)
Victoza can certainly cause nausea and diarrhea, it is why we start with lower dose and titrate up. Side effects are supposed to resolved in a few days. She can discontinue Victoza if symptoms are severe.  Metformin has similar side effects.   Other options: Trulicity (similar group that Victoza but weekly injection and may be better tolerated), Farxiga, and Lantus.  Thanks, BJ

## 2017-09-14 NOTE — Telephone Encounter (Signed)
Spoke with patient. Gave instructions per Dr. Swazil[place]Jordan, patient verbalized understanding. Patient coming in on 09/19/17 to discuss with PCP.

## 2017-09-15 DIAGNOSIS — M48062 Spinal stenosis, lumbar region with neurogenic claudication: Secondary | ICD-10-CM | POA: Diagnosis not present

## 2017-09-18 NOTE — Progress Notes (Signed)
ACUTE VISIT   HPI:  Chief Complaint  Patient presents with  . Discuss Medications    need medication refills and possible medication change    Julia Dickerson is a 42 y.o. female, who is here today complaining of nausea and worsening diarrhea, both attributed to Victoza. She states that she has chronic diarrhea but worse after she started Victoza. Nausea started when she increased dose from 0.6 mg to 1.2 mg.  Currently she is on Metformin 1000 mg twice daily and glimepiride 2 mg twice daily. In the past she has been on insulin, she would like to try again.  Lab Results  Component Value Date   HGBA1C 8.7 08/19/2017   Diarrhea is back to her baseline, she is having about 1-2 stools daily. She denies blood in stool.  No associated abdominal pain or vomiting. No abnormal weight loss.  Neuropathic pain:  She is tolerating Cymbalta well, she has noticed some improvement on neuropathic pain as well as back pain. She has not noted side effects.   She denies suicidal thoughts or worsening depression.  HTN: She is not checking BP's at home. She is on Lisinopril 10 mg daily and Cardizem 180 mg daily and 120 mg prn.  Decreasing numbers of cig/day. Denies severe/frequent headache, visual changes, chest pain, dyspnea, palpitation, focal weakness, or edema.   Review of Systems  Constitutional: Positive for fatigue. Negative for activity change, appetite change and fever.  HENT: Negative for mouth sores, nosebleeds and trouble swallowing.   Eyes: Negative for redness and visual disturbance.  Respiratory: Negative for cough, shortness of breath and wheezing.   Cardiovascular: Negative for palpitations and leg swelling.  Gastrointestinal: Positive for diarrhea and nausea. Negative for abdominal pain and vomiting.       Negative for changes in bowel habits.  Endocrine: Negative for polydipsia, polyphagia and polyuria.  Genitourinary: Negative for decreased urine volume  and hematuria.  Skin: Negative for rash and wound.  Neurological: Negative for syncope, weakness and headaches.  Psychiatric/Behavioral: Negative for confusion. The patient is nervous/anxious.       Current Outpatient Medications on File Prior to Visit  Medication Sig Dispense Refill  . apixaban (ELIQUIS) 5 MG TABS tablet Take 1 tablet (5 mg total) 2 (two) times daily by mouth. 180 tablet 3  . atorvastatin (LIPITOR) 40 MG tablet Take 1 tablet (40 mg total) by mouth daily. 90 tablet 2  . Blood Glucose Monitoring Suppl (FIFTY50 GLUCOSE METER 2.0) w/Device KIT     . diltiazem (CARDIZEM CD) 120 MG 24 hr capsule TAKE 1 CAPSULE EVERY DAY (Patient taking differently: as needed) 90 capsule 1  . diltiazem (CARDIZEM CD) 180 MG 24 hr capsule Take 180 mg by mouth daily.     [name] diltiazem (CARDIZEM) 60 MG tablet Take 1 tablet if needed for palpitations. Can be taken every 6 hours. 20 tablet 0  . flecainide (TAMBOCOR) 100 MG tablet Take 2 tablets (200 mg total) by mouth as needed. For A-fib 10 tablet 6  . Insulin Pen Needle 32G X 4 MM MISC Use daily with insulin pen. 100 each 3  . liraglutide (VICTOZA) 18 MG/3ML SOPN Inject 0.3 mLs (1.8 mg total) into the skin daily. 9 mL 3  . lisinopril (PRINIVIL,ZESTRIL) 10 MG tablet Take 1 tablet (10 mg total) by mouth daily. 90 tablet 2  . nicotine (NICODERM CQ - DOSED IN MG/24 HOURS) 21 mg/24hr patch Place 1 patch (21 mg total) onto the skin daily. [place]  patch 0  . omeprazole (PRILOSEC) 20 MG capsule     . albuterol (PROVENTIL HFA;VENTOLIN HFA) 108 (90 Base) MCG/ACT inhaler Inhale 2 puffs into the lungs every 6 (six) hours as needed for wheezing or shortness of breath. (Patient not taking: Reported on 09/19/2017) 1 Inhaler 0   No current facility-administered medications on file prior to visit.      Past Medical History:  Diagnosis Date  . Anxiety   . Arthritis   . Atrial fibrillation (HCC)    a. paroxysmal, uses PRN Flecainide  . Depression   . Diabetes mellitus  without complication ([place])   . Dysrhythmia   . GERD (gastroesophageal reflux disease)   . Hypertension   . Obesity   . PCOS (polycystic ovarian syndrome)   . UTI (lower urinary tract infection)    Allergies  Allergen Reactions  . Sulfa Antibiotics Hives    Social History   Socioeconomic History  . Marital status: Married    Spouse name: None  . Number of children: 0  . Years of education: None  . Highest education level: None  Social Needs  . Financial resource strain: None  . Food insecurity - worry: None  . Food insecurity - inability: None  . Transportation needs - medical: None  . Transportation needs - non-medical: None  Occupational History  . None  Tobacco Use  . Smoking status: Current Every Day Smoker    Packs/day: 0.50    Years: 21.00    Pack years: 10.50    Types: Cigarettes, E-cigarettes  . Smokeless tobacco: Never Used  . Tobacco comment: vapor  Substance and Sexual Activity  . Alcohol use: Yes    Comment: rare  . Drug use: No  . Sexual activity: Yes    Partners: Male    Birth control/protection: None  Other Topics Concern  . None  Social History Narrative  . None    Vitals:   09/19/17 1121  BP: 140/88  Pulse: 97  Resp: 12  Temp: 98.6 F (37 C)  SpO2: 98%   Body mass index is 39.11 kg/m.   Physical Exam  Nursing note and vitals reviewed. Constitutional: She is oriented to person, place, and time. She appears well-developed. No distress.  HENT:  Head: Normocephalic and atraumatic.  Mouth/Throat: Oropharynx is clear and moist and mucous membranes are normal.  Eyes: Conjunctivae are normal. Pupils are equal, round, and reactive to light.  Cardiovascular: Normal rate and regular rhythm.  No murmur heard. Respiratory: Effort normal and breath sounds normal. No respiratory distress.  GI: Soft. She exhibits no mass. There is no hepatomegaly. There is no tenderness.  Musculoskeletal: She exhibits edema (Trace pitting LE edema,bilateral.).  She exhibits no tenderness.  Lymphadenopathy:    She has no cervical adenopathy.  Neurological: She is alert and oriented to person, place, and time. She has normal strength. Gait normal.  Skin: Skin is warm. No erythema.  Psychiatric: Her mood appears anxious.  Fairly groomed, good eye contact.     ASSESSMENT AND PLAN:   Julia Dickerson was seen today for discuss medications.  Diagnoses and all orders for this visit:  Type 2 diabetes mellitus with diabetic neuropathy, unspecified ([place]) After discussion of other pharmacologic options for diabetes, she agrees with: Decreasing Metformin dose from 1000 mg to 750 mg twice daily. Glipizide decreased from 2 mg twice daily to 2 mg once daily. Basaglar 20 units started today. Instructed about warning signs. Caution with hypoglycemia Keep next appointment.  Chronic diarrhea  Diarrhea is back to her baseline. Possible etiologies discussed, ? IBS.  She has not had colonoscopy done,for now we will hold on further studies.  She does not think Metformin is causing the problem, she agrees with decreasing dose from 1000 mg twice daily to 750 mg twice daily. Adequate hydration.   Neuropathic pain Mildly improvement with Cymbalta 30 mg, since she has tolerated it well it was increased to 60 mg. Follow-up in 3-4 months.   Essential hypertension  BP slightly elevated. Smoking cessation will help. Instructed to monitor BP at home and bring readings next OV.    -Julia Dickerson was advised to seek immediate medical attention if sudden worsening symptoms or to follow if they persist or if new concerns arise.       Julia G. Martin[place], MD  [facility]. [place] office.

## 2017-09-19 ENCOUNTER — Encounter: Payer: Self-pay | Admitting: Family Medicine

## 2017-09-19 ENCOUNTER — Ambulatory Visit: Payer: 59 | Admitting: Family Medicine

## 2017-09-19 VITALS — BP 140/88 | HR 97 | Temp 98.6°F | Resp 12 | Ht 68.0 in | Wt 257.2 lb

## 2017-09-19 DIAGNOSIS — E1140 Type 2 diabetes mellitus with diabetic neuropathy, unspecified: Secondary | ICD-10-CM

## 2017-09-19 DIAGNOSIS — M792 Neuralgia and neuritis, unspecified: Secondary | ICD-10-CM

## 2017-09-19 DIAGNOSIS — I10 Essential (primary) hypertension: Secondary | ICD-10-CM | POA: Diagnosis not present

## 2017-09-19 DIAGNOSIS — K529 Noninfective gastroenteritis and colitis, unspecified: Secondary | ICD-10-CM | POA: Diagnosis not present

## 2017-09-19 MED ORDER — METFORMIN HCL ER 750 MG PO TB24: 750.0000 mg | ORAL_TABLET | Freq: Two times a day (BID) | ORAL | 1 refills | Status: DC

## 2017-09-19 MED ORDER — BASAGLAR KWIKPEN 100 UNIT/ML ~~LOC~~ SOPN: 20.0000 [IU] | PEN_INJECTOR | Freq: Every day | SUBCUTANEOUS | 3 refills | Status: DC

## 2017-09-19 MED ORDER — GLIMEPIRIDE 2 MG PO TABS: 2.0000 mg | ORAL_TABLET | Freq: Every day | ORAL | 1 refills | Status: DC

## 2017-09-19 MED ORDER — DULOXETINE HCL 60 MG PO CPEP: 60.0000 mg | ORAL_CAPSULE | Freq: Every day | ORAL | 1 refills | Status: DC

## 2017-09-19 NOTE — Assessment & Plan Note (Signed)
Diarrhea is back to her baseline. Possible etiologies discussed, ? IBS.  She has not had colonoscopy done,for now we will hold on further studies.  She does not think Metformin is causing the problem, she agrees with decreasing dose from 1000 mg twice daily to 750 mg twice daily. Adequate hydration.

## 2017-09-19 NOTE — Patient Instructions (Signed)
A few things to remember from today's visit:   Diarrhea, unspecified type  Type 2 diabetes mellitus with diabetic neuropathy, without long-term current use of insulin (HCC) - Plan: glimepiride (AMARYL) 2 MG tablet   Please be sure medication list is accurate. If a new problem present, please set up appointment sooner than planned today.

## 2017-09-19 NOTE — Assessment & Plan Note (Signed)
After discussion of other pharmacologic options for diabetes, she agrees with: Decreasing Metformin dose from 1000 mg to 750 mg twice daily. Glipizide decreased from 2 mg twice daily to 2 mg once daily. Basaglar 20 units started today. Instructed about warning signs. Caution with hypoglycemia Keep next appointment.

## 2017-09-19 NOTE — Assessment & Plan Note (Signed)
Mildly improvement with Cymbalta 30 mg, since she has tolerated it well it was increased to 60 mg. Follow-up in 3-4 months.

## 2017-10-07 ENCOUNTER — Telehealth: Payer: Self-pay

## 2017-10-07 NOTE — Telephone Encounter (Signed)
   [facility] Pre-operative Risk Assessment    Request for surgical clearance:  1. What type of surgery is being performed? ESI lumbar   2. When is this surgery scheduled? 10/28/17   3. What type of clearance is required (medical clearance vs. Pharmacy clearance to hold med vs. Both)? pharmacy  4. Are there any medications that need to be held prior to surgery and how long? eliquis 5 mg BID; 3-5 days prior   5. Practice name and name of physician performing surgery? [facility] - Dr Marlaine Hind   6. What is your office phone and fax number? ph: [phone]; f: [phone]   7. Anesthesia type (None, local, MAC, general) ? unknown

## 2017-10-11 NOTE — Telephone Encounter (Signed)
Pt takes Eliquis for afib with CHADS2VASc score of 3 (sex, HTN, DM). Renal function is normal. Ok to hold Eli[facility] for [facility].

## 2017-10-20 DIAGNOSIS — M5416 Radiculopathy, lumbar region: Secondary | ICD-10-CM | POA: Diagnosis not present

## 2017-10-20 DIAGNOSIS — M48062 Spinal stenosis, lumbar region with neurogenic claudication: Secondary | ICD-10-CM | POA: Diagnosis not present

## 2017-10-28 DIAGNOSIS — M48062 Spinal stenosis, lumbar region with neurogenic claudication: Secondary | ICD-10-CM | POA: Diagnosis not present

## 2017-10-28 DIAGNOSIS — M5416 Radiculopathy, lumbar region: Secondary | ICD-10-CM | POA: Diagnosis not present

## 2017-11-13 ENCOUNTER — Other Ambulatory Visit: Payer: Self-pay | Admitting: Family Medicine

## 2017-11-13 DIAGNOSIS — E1140 Type 2 diabetes mellitus with diabetic neuropathy, unspecified: Secondary | ICD-10-CM

## 2017-11-23 DIAGNOSIS — M5416 Radiculopathy, lumbar region: Secondary | ICD-10-CM | POA: Diagnosis not present

## 2017-11-23 DIAGNOSIS — M48062 Spinal stenosis, lumbar region with neurogenic claudication: Secondary | ICD-10-CM | POA: Diagnosis not present

## 2017-12-06 DIAGNOSIS — M5416 Radiculopathy, lumbar region: Secondary | ICD-10-CM | POA: Diagnosis not present

## 2017-12-06 DIAGNOSIS — E119 Type 2 diabetes mellitus without complications: Secondary | ICD-10-CM | POA: Diagnosis not present

## 2017-12-06 DIAGNOSIS — M48062 Spinal stenosis, lumbar region with neurogenic claudication: Secondary | ICD-10-CM | POA: Diagnosis not present

## 2017-12-12 ENCOUNTER — Other Ambulatory Visit: Payer: Self-pay | Admitting: Family Medicine

## 2017-12-12 DIAGNOSIS — M792 Neuralgia and neuritis, unspecified: Secondary | ICD-10-CM

## 2017-12-25 NOTE — Progress Notes (Signed)
HPI:   [name] is a 42 y.o. female, who is here today for 5-6 months follow up.   She was last seen on 09/19/17 for acute visit.  Diabetes Mellitus II:   Dx around 2003. Currently on Glimepiride 2 mg daily, Metformin 750 mg twice daily, and Basaglar 20 units daily. Problem has been stable. Checking BS's : "All over the place", in general between 150 and 200's. Hypoglycemia:Denies.  She is tolerating medications well. Negative for abdominal pain, nausea, vomiting, polydipsia, polyuria, or polyphagia. Peripheral neuropathy, she did not continue Cymbalta. Currently she is on Gabapentin.  Lab Results  Component Value Date   HGBA1C 8.7 08/19/2017   Lab Results  Component Value Date   MICROALBUR 302.7 (H) 08/22/2017    Hypertension:  Dx 10+ years ago. Currently on Cardizem CD 180 mg daily and Cardizem 60 mg daily as needed.   She is taking medications as instructed, no side effects reported.  She has not noted unusual headache, visual changes, exertional chest pain, dyspnea,  focal weakness, or edema.   Lab Results  Component Value Date   CREATININE 0.79 08/15/2017   BUN 8 08/15/2017   NA 136 08/15/2017   K 3.7 08/15/2017   CL 102 08/15/2017   CO2 20 (L) 08/15/2017    Since her last visit she has been started on Gabapentin , which is helping with back pain and LLE numbness.  History of pain and lower back pain, lower back pain with radiculopathy of both lower extremities. She is following with neurosurgeon, Dr. Dutch Quint.  She also follows with Dr. Murray Hodgkins for pain management.  LLQ pain, which she has had since 12/2016. She was in the ER on 01/06/2017 with similar problem.  She was discharged on Voltaren 75 mg twice daily for 15 days and Flexeril 10 mg 3 times daily for 10 days. "Muscle" pain has been stable but has not resolved.  Denies dysuria,increased urinary frequency, gross hematuria,or decreased urine output.  Left-sided waist pressure-like  pain, constant, moderate but she feels like she is used to the pain.  Probably has been going on for a year. No tingling or numbness. No changes in bowel habits or urinary symptoms. She has not identified exacerbating or alleviating factors.   She also reports history of "irregular menses", last menstrual period in 11/2017, which was heavier than usual and associated with nausea and lightheadedness. She is reporting history of PCOS, prior menses 10/2016 and 04/2017. She is sexually active and is not on birth control. She is established with gynecologist, Dr. Seymour Bars.  Last Pap smear 4 to 5 years ago.    Review of Systems  Constitutional: Positive for fatigue. Negative for activity change, appetite change, fever and unexpected weight change.  HENT: Negative for mouth sores, nosebleeds and trouble swallowing.   Eyes: Negative for redness and visual disturbance.  Respiratory: Negative for cough, shortness of breath and wheezing.   Cardiovascular: Negative for chest pain, palpitations and leg swelling.  Gastrointestinal: Positive for abdominal pain. Negative for nausea and vomiting.       Negative for changes in bowel habits.  Endocrine: Negative for cold intolerance, heat intolerance, polydipsia, polyphagia and polyuria.  Genitourinary: Positive for menstrual problem. Negative for decreased urine volume, dysuria and hematuria.  Musculoskeletal: Positive for arthralgias, back pain and neck pain.  Skin: Negative for rash and wound.  Neurological: Positive for numbness. Negative for syncope, weakness and headaches.  Psychiatric/Behavioral: Negative for confusion. The patient is nervous/anxious.  Current Outpatient Medications on File Prior to Visit  Medication Sig Dispense Refill  . apixaban (ELIQUIS) 5 MG TABS tablet Take 1 tablet (5 mg total) 2 (two) times daily by mouth. 180 tablet 3  . atorvastatin (LIPITOR) 40 MG tablet Take 1 tablet (40 mg total) by mouth daily. 90 tablet 2  .  diltiazem (CARDIZEM CD) 120 MG 24 hr capsule TAKE 1 CAPSULE EVERY DAY (Patient taking differently: as needed) 90 capsule 1  . diltiazem (CARDIZEM CD) 180 MG 24 hr capsule Take 180 mg by mouth daily.     [name] diltiazem (CARDIZEM) 60 MG tablet Take 1 tablet if needed for palpitations. Can be taken every 6 hours. 20 tablet 0  . flecainide (TAMBOCOR) 100 MG tablet Take 2 tablets (200 mg total) by mouth as needed. For A-fib 10 tablet 6  . gabapentin (NEURONTIN) 300 MG capsule TAKE ONE CAPSULE BY MOUTH 3 TIMES A DAY FOR 7 DAYS THEN 2 CAPSULES THREE TIMES DAILY  1  . glimepiride (AMARYL) 2 MG tablet Take 1 tablet (2 mg total) by mouth daily with breakfast. 180 tablet 1  . Insulin Glargine (BASAGLAR KWIKPEN) 100 UNIT/ML SOPN INJECT 0.2 MLS (20 UNITS TOTAL) INTO THE SKIN AT BEDTIME. 15 pen 1  . Insulin Pen Needle 32G X 4 MM MISC Use daily with insulin pen. 100 each 3  . lisinopril (PRINIVIL,ZESTRIL) 10 MG tablet Take 1 tablet (10 mg total) by mouth daily. 90 tablet 2  . metFORMIN (GLUCOPHAGE-XR) 750 MG 24 hr tablet Take 1 tablet (750 mg total) by mouth 2 (two) times daily with a meal. 180 tablet 1  . nicotine (NICODERM CQ - DOSED IN MG/24 HOURS) 21 mg/24hr patch Place 1 patch (21 mg total) onto the skin daily. 45 patch 0  . omeprazole (PRILOSEC) 20 MG capsule      No current facility-administered medications on file prior to visit.      Past Medical History:  Diagnosis Date  . Anxiety   . Arthritis   . Atrial fibrillation (HCC)    a. paroxysmal, uses PRN Flecainide  . Depression   . Diabetes mellitus without complication (HCC)   . Dysrhythmia   . GERD (gastroesophageal reflux disease)   . Hypertension   . Obesity   . PCOS (polycystic ovarian syndrome)   . UTI (lower urinary tract infection)    Allergies  Allergen Reactions  . Sulfa Antibiotics Hives    Social History   Socioeconomic History  . Marital status: Married    Spouse name: Not on file  . Number of children: 0  . Years of  education: Not on file  . Highest education level: Not on file  Occupational History  . Not on file  Social Needs  . Financial resource strain: Not on file  . Food insecurity:    Worry: Not on file    Inability: Not on file  . Transportation needs:    Medical: Not on file    Non-medical: Not on file  Tobacco Use  . Smoking status: Current Every Day Smoker    Packs/day: 0.50    Years: 21.00    Pack years: 10.50    Types: Cigarettes, E-cigarettes  . Smokeless tobacco: Never Used  . Tobacco comment: vapor  Substance and Sexual Activity  . Alcohol use: Yes    Comment: rare  . Drug use: No  . Sexual activity: Yes    Partners: Male    Birth control/protection: None  Lifestyle  . Physical activity:  Days per week: Not on file    Minutes per session: Not on file  . Stress: Not on file  Relationships  . Social connections:    Talks on phone: Not on file    Gets together: Not on file    Attends religious service: Not on file    Active member of club or organization: Not on file    Attends meetings of clubs or organizations: Not on file    Relationship status: Not on file  Other Topics Concern  . Not on file  Social History Narrative  . Not on file    Vitals:   12/26/17 1051  BP: 128/84  Pulse: 96  Resp: 12  Temp: 98.5 F (36.9 C)  SpO2: 98%   Body mass index is 39.57 kg/m.   Physical Exam  Nursing note and vitals reviewed. Constitutional: She is oriented to person, place, and time. She appears well-developed. No distress.  HENT:  Head: Normocephalic and atraumatic.  Mouth/Throat: Oropharynx is clear and moist and mucous membranes are normal.  Eyes: Pupils are equal, round, and reactive to light. Conjunctivae are normal.  Cardiovascular: Normal rate and regular rhythm.  No murmur heard. Pulses:      Dorsalis pedis pulses are 2+ on the right side, and 2+ on the left side.  Respiratory: Effort normal and breath sounds normal. No respiratory distress.  GI:  Soft. She exhibits no mass. There is no hepatomegaly. There is no tenderness.  Musculoskeletal: She exhibits no edema.       Lumbar back: She exhibits no tenderness and no bony tenderness.  Lymphadenopathy:    She has no cervical adenopathy.  Neurological: She is alert and oriented to person, place, and time. She has normal strength. Coordination normal.  Skin: Skin is warm. No erythema.  Psychiatric: She has a normal mood and affect.  Well groomed, good eye contact.      ASSESSMENT AND PLAN:   [name] was seen today for 5-6 months follow-up.  Orders Placed This Encounter  Procedures  . US PELVIC COMPLETE WITH TRANSVAGINAL  . Hemoglobin A1c  . Basic metabolic panel  . Microalbumin / creatinine urine ratio  . CBC  . Urinalysis, Routine w reflex microscopic   Lab Results  Component Value Date   CREATININE 0.67 12/26/2017   BUN 11 12/26/2017   NA 137 12/26/2017   K 5.0 12/26/2017   CL 99 12/26/2017   CO2 29 12/26/2017   Lab Results  Component Value Date   WBC 10.2 12/26/2017   HGB 14.4 12/26/2017   HCT 41.9 12/26/2017   MCV 88.5 12/26/2017   PLT 324.0 12/26/2017   Lab Results  Component Value Date   HGBA1C 9.2 (H) 12/26/2017   Lab Results  Component Value Date   MICROALBUR 7.4 (H) 12/26/2017    1. Type 2 diabetes mellitus with diabetic neuropathy, without long-term current use of insulin (HCC)  HgA1C pending today, problem has not been well controlled. No changes in current management, will adjust medications depending of hemoglobin A1c. Regular exercise and healthy diet with avoidance of added sugar food intake is an important part of treatment and recommended. Annual eye exam, periodic dental and foot care recommended. F/U in 5-6 months  - Hemoglobin A1c - Basic metabolic panel - Microalbumin / creatinine urine ratio  2. Essential hypertension Adequately controlled. No changes in current management. Low-salt diet to continue. Eye exam  recommended annually. F/U in 6 months, before if needed.  3. DUB (dysfunctional uterine bleeding)  Reporting history of PCOS. Strongly recommend arrange a follow-up appointment with her gynecologist. Further recommendation will be given according to CBC results on pelvic/transvaginal ultrasound. Instructed about warning signs.  - CBC - US PELVIC COMPLETE WITH TRANSVAGINAL; Future  4. Continuous LLQ abdominal pain  Chronic. Possible etiology discussed. ?  Radicular pain. ? IBS related. Further recommendation will be given according to UA and pelvic US.  - Urinalysis, Routine w reflex microscopic - US PELVIC COMPLETE WITH TRANSVAGINAL; Future    Betty G. [place], MD  [facility]. [place] office.

## 2017-12-26 ENCOUNTER — Encounter: Payer: Self-pay | Admitting: Family Medicine

## 2017-12-26 ENCOUNTER — Ambulatory Visit: Payer: 59 | Admitting: Family Medicine

## 2017-12-26 VITALS — BP 128/84 | HR 96 | Temp 98.5°F | Resp 12 | Ht 68.0 in | Wt 260.2 lb

## 2017-12-26 DIAGNOSIS — N938 Other specified abnormal uterine and vaginal bleeding: Secondary | ICD-10-CM

## 2017-12-26 DIAGNOSIS — E1140 Type 2 diabetes mellitus with diabetic neuropathy, unspecified: Secondary | ICD-10-CM

## 2017-12-26 DIAGNOSIS — R1032 Left lower quadrant pain: Secondary | ICD-10-CM

## 2017-12-26 DIAGNOSIS — I10 Essential (primary) hypertension: Secondary | ICD-10-CM

## 2017-12-26 LAB — URINALYSIS, ROUTINE W REFLEX MICROSCOPIC
Bilirubin Urine: NEGATIVE
Ketones, ur: NEGATIVE
Leukocytes, UA: NEGATIVE
Nitrite: NEGATIVE
Specific Gravity, Urine: 1.02 (ref 1.000–1.030)
Total Protein, Urine: NEGATIVE
Urine Glucose: 250 — AB
Urobilinogen, UA: 0.2 (ref 0.0–1.0)
pH: 6 (ref 5.0–8.0)

## 2017-12-26 LAB — CBC
HCT: 41.9 % (ref 36.0–46.0)
Hemoglobin: 14.4 g/dL (ref 12.0–15.0)
MCHC: 34.3 g/dL (ref 30.0–36.0)
MCV: 88.5 fl (ref 78.0–100.0)
Platelets: 324 10*3/uL (ref 150.0–400.0)
RBC: 4.73 Mil/uL (ref 3.87–5.11)
RDW: 14.4 % (ref 11.5–15.5)
WBC: 10.2 10*3/uL (ref 4.0–10.5)

## 2017-12-26 LAB — BASIC METABOLIC PANEL
BUN: 11 mg/dL (ref 6–23)
CO2: 29 mEq/L (ref 19–32)
Calcium: 9.6 mg/dL (ref 8.4–10.5)
Chloride: 99 mEq/L (ref 96–112)
Creatinine, Ser: 0.67 mg/dL (ref 0.40–1.20)
GFR: 102.89 mL/min (ref >60.00)
Glucose, Bld: 213 mg/dL — ABNORMAL HIGH (ref 70–99)
Potassium: 5 mEq/L (ref 3.5–5.1)
Sodium: 137 mEq/L (ref 135–145)

## 2017-12-26 LAB — MICROALBUMIN / CREATININE URINE RATIO
Creatinine,U: 109.1 mg/dL
Microalb Creat Ratio: 6.8 mg/g (ref 0.0–30.0)
Microalb, Ur: 7.4 mg/dL — ABNORMAL HIGH (ref 0.0–1.9)

## 2017-12-26 LAB — HEMOGLOBIN A1C: Hgb A1c MFr Bld: 9.2 % — ABNORMAL HIGH (ref 4.6–6.5)

## 2017-12-26 NOTE — Patient Instructions (Addendum)
A few things to remember from today's visit:   Type 2 diabetes mellitus with diabetic neuropathy, without long-term current use of insulin (HCC) - Plan: Hemoglobin A1c, Basic metabolic panel, Microalbumin / creatinine urine ratio  Essential hypertension  DUB (dysfunctional uterine bleeding) - Plan: CBC, US PELVIC COMPLETE WITH TRANSVAGINAL  Continuous LLQ abdominal pain - Plan: Urinalysis, Routine w reflex microscopic, US PELVIC COMPLETE WITH TRANSVAGINAL  ? Pain related to back.   No changes today, will adjust treatment according to lab results.   Please be sure medication list is accurate. If a new problem present, please set up appointment sooner than planned today.

## 2017-12-30 ENCOUNTER — Telehealth: Payer: Self-pay | Admitting: Family Medicine

## 2017-12-30 ENCOUNTER — Encounter: Payer: Self-pay | Admitting: Family Medicine

## 2017-12-30 NOTE — Telephone Encounter (Signed)
Charted in result notes. 

## 2017-12-30 NOTE — Telephone Encounter (Signed)
Copied from CRM [phone]. Topic: Quick Communication - Lab Results >> Dec 30, 2017  3:08 PM Starla Linkakley, Carolyn J, RN wrote: Jeanene Erballed patient to inform them of lab results. When patient returns call, triage nurse may disclose results. >> Dec 30, 2017  3:22 PM Darletta MollLander, Lumin L wrote: No [facility] RN free for results.

## 2018-01-05 ENCOUNTER — Ambulatory Visit
Admission: RE | Admit: 2018-01-05 | Discharge: 2018-01-05 | Disposition: A | Discharge status: Home / Self Care | Payer: 59 | Source: Ambulatory Visit | Attending: Family Medicine | Admitting: Family Medicine

## 2018-01-05 DIAGNOSIS — N938 Other specified abnormal uterine and vaginal bleeding: Secondary | ICD-10-CM

## 2018-01-05 DIAGNOSIS — R1032 Left lower quadrant pain: Secondary | ICD-10-CM

## 2018-01-05 DIAGNOSIS — N83292 Other ovarian cyst, left side: Secondary | ICD-10-CM | POA: Diagnosis not present

## 2018-01-08 ENCOUNTER — Encounter: Payer: Self-pay | Admitting: Family Medicine

## 2018-01-08 ENCOUNTER — Other Ambulatory Visit: Payer: Self-pay | Admitting: Family Medicine

## 2018-01-08 DIAGNOSIS — N83202 Unspecified ovarian cyst, left side: Secondary | ICD-10-CM

## 2018-01-12 ENCOUNTER — Other Ambulatory Visit: Payer: Self-pay | Admitting: Cardiology

## 2018-01-19 ENCOUNTER — Other Ambulatory Visit: Payer: Self-pay | Admitting: *Deleted

## 2018-01-19 MED ORDER — DILTIAZEM HCL ER COATED BEADS 120 MG PO CP24: 120.0000 mg | ORAL_CAPSULE | ORAL | 1 refills | Status: DC | PRN

## 2018-01-20 ENCOUNTER — Encounter: Payer: Self-pay | Admitting: *Deleted

## 2018-01-20 NOTE — Telephone Encounter (Signed)
This encounter was created in error - please disregard.

## 2018-02-20 ENCOUNTER — Telehealth: Payer: Self-pay | Admitting: Family Medicine

## 2018-02-20 NOTE — Telephone Encounter (Signed)
It is fine with me

## 2018-02-20 NOTE — Telephone Encounter (Signed)
Copied from CRM [id number]. Topic: Appointment Scheduling - Scheduling Inquiry for Clinic >> Feb 20, 2018 10:38 AM Oneal GroutSebastian, Jennifer S wrote: Reason for CRM: Requesting to transfer care from Dr Swazil[place]Jordan to Dr Salomon FickBanks. Please advise

## 2018-03-03 NOTE — Telephone Encounter (Signed)
Pt calling to check status if Dr. Salomon FickBanks will approve to take her own as a new pt. Please call to schedule once approved.

## 2018-03-03 NOTE — Telephone Encounter (Signed)
ok 

## 2018-04-03 ENCOUNTER — Other Ambulatory Visit: Payer: Self-pay

## 2018-04-28 ENCOUNTER — Ambulatory Visit: Payer: Self-pay | Admitting: Family Medicine

## 2018-06-13 ENCOUNTER — Encounter (HOSPITAL_COMMUNITY): Payer: Self-pay | Admitting: Emergency Medicine

## 2018-06-13 ENCOUNTER — Emergency Department (HOSPITAL_COMMUNITY)
Admission: EM | Admit: 2018-06-13 | Discharge: 2018-06-14 | Disposition: A | Discharge status: Home / Self Care | Payer: 59 | Attending: Emergency Medicine | Admitting: Emergency Medicine

## 2018-06-13 ENCOUNTER — Emergency Department (HOSPITAL_COMMUNITY): Payer: 59

## 2018-06-13 DIAGNOSIS — F17210 Nicotine dependence, cigarettes, uncomplicated: Secondary | ICD-10-CM | POA: Diagnosis not present

## 2018-06-13 DIAGNOSIS — Z79899 Other long term (current) drug therapy: Secondary | ICD-10-CM | POA: Insufficient documentation

## 2018-06-13 DIAGNOSIS — Y999 Unspecified external cause status: Secondary | ICD-10-CM | POA: Insufficient documentation

## 2018-06-13 DIAGNOSIS — R072 Precordial pain: Secondary | ICD-10-CM | POA: Diagnosis present

## 2018-06-13 DIAGNOSIS — M542 Cervicalgia: Secondary | ICD-10-CM | POA: Diagnosis not present

## 2018-06-13 DIAGNOSIS — I10 Essential (primary) hypertension: Secondary | ICD-10-CM | POA: Diagnosis not present

## 2018-06-13 DIAGNOSIS — R0789 Other chest pain: Secondary | ICD-10-CM

## 2018-06-13 DIAGNOSIS — Z7901 Long term (current) use of anticoagulants: Secondary | ICD-10-CM | POA: Insufficient documentation

## 2018-06-13 DIAGNOSIS — S299XXA Unspecified injury of thorax, initial encounter: Secondary | ICD-10-CM | POA: Diagnosis not present

## 2018-06-13 DIAGNOSIS — Y9389 Activity, other specified: Secondary | ICD-10-CM | POA: Diagnosis not present

## 2018-06-13 DIAGNOSIS — E119 Type 2 diabetes mellitus without complications: Secondary | ICD-10-CM | POA: Insufficient documentation

## 2018-06-13 DIAGNOSIS — Y92481 Parking lot as the place of occurrence of the external cause: Secondary | ICD-10-CM | POA: Insufficient documentation

## 2018-06-13 DIAGNOSIS — Z794 Long term (current) use of insulin: Secondary | ICD-10-CM | POA: Insufficient documentation

## 2018-06-13 LAB — I-STAT CHEM 8, ED
BUN: 12 mg/dL (ref 6–20)
Calcium, Ion: 1.19 mmol/L (ref 1.15–1.40)
Chloride: 100 mmol/L (ref 98–111)
Creatinine, Ser: 0.5 mg/dL (ref 0.44–1.00)
Glucose, Bld: 313 mg/dL — ABNORMAL HIGH (ref 70–99)
HCT: 47 % — ABNORMAL HIGH (ref 36.0–46.0)
Hemoglobin: 16 g/dL — ABNORMAL HIGH (ref 12.0–15.0)
Potassium: 4.1 mmol/L (ref 3.5–5.1)
Sodium: 134 mmol/L — ABNORMAL LOW (ref 135–145)
TCO2: 24 mmol/L (ref 22–32)

## 2018-06-13 LAB — PROTIME-INR
INR: 1
Prothrombin Time: 13.1 seconds (ref 11.4–15.2)

## 2018-06-13 MED ORDER — METHOCARBAMOL 500 MG PO TABS: 500.0000 mg | ORAL_TABLET | Freq: Two times a day (BID) | ORAL | 0 refills | Status: DC

## 2018-06-13 MED ORDER — IOHEXOL 300 MG/ML  SOLN
75.0000 mL | Freq: Once | INTRAMUSCULAR | Status: AC | PRN
  Administered 2018-06-13: 75 mL via INTRAVENOUS

## 2018-06-13 NOTE — Discharge Instructions (Addendum)
I have prescribed muscle relaxers for your pain, please do not drink or drive while taking this medications as they can make you drowsy.  Please follow-up with PCP in 1 week for reevaluation of your symptoms.  You experience any bowel or bladder incontinence, fever, worsening in your symptoms please return to the ED. ° °
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
## 2018-06-13 NOTE — ED Notes (Signed)
Pt. To CT via stretcher. 
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
## 2018-06-13 NOTE — ED Notes (Signed)
Patient transported to X-ray 

## 2018-06-14 MED ORDER — HYDROCODONE-ACETAMINOPHEN 5-325 MG PO TABS: 1.0000 | ORAL_TABLET | Freq: Four times a day (QID) | ORAL | 0 refills | Status: DC | PRN

## 2018-06-19 ENCOUNTER — Encounter: Payer: Self-pay | Admitting: Family Medicine

## 2018-06-19 ENCOUNTER — Ambulatory Visit: Payer: 59 | Admitting: Family Medicine

## 2018-06-19 VITALS — BP 128/92 | HR 86 | Temp 98.6°F | Ht 68.0 in | Wt 255.0 lb

## 2018-06-19 DIAGNOSIS — E1140 Type 2 diabetes mellitus with diabetic neuropathy, unspecified: Secondary | ICD-10-CM

## 2018-06-19 DIAGNOSIS — Z8739 Personal history of other diseases of the musculoskeletal system and connective tissue: Secondary | ICD-10-CM

## 2018-06-19 DIAGNOSIS — I10 Essential (primary) hypertension: Secondary | ICD-10-CM | POA: Diagnosis not present

## 2018-06-19 DIAGNOSIS — I4891 Unspecified atrial fibrillation: Secondary | ICD-10-CM

## 2018-06-19 DIAGNOSIS — F17200 Nicotine dependence, unspecified, uncomplicated: Secondary | ICD-10-CM | POA: Diagnosis not present

## 2018-06-19 DIAGNOSIS — E785 Hyperlipidemia, unspecified: Secondary | ICD-10-CM

## 2018-06-19 LAB — HEMOGLOBIN A1C: Hgb A1c MFr Bld: 10.5 % — ABNORMAL HIGH (ref 4.6–6.5)

## 2018-06-19 NOTE — Progress Notes (Signed)
Subjective:    Patient ID: Julia Dickerson, female    DOB: 10/31/1975, 42 y.o.   MRN: [id number]  No chief complaint on file.   HPI Patient was seen today for f/u on chronic conditions and TOC, previously seen by Dr. [place].  DMII: -taking metformin 750 mg BID -was on glimeperide and basaglar.   -pt stopped taking them as she "got tired" -not checking fsbs at home -hgb A1C 9.2% on 12/26/17  Afib: -seen by Dr. [facility] -no episodes this yr -taking Eliquis and Diltiazem   DDD: -s/p disc replacement and bone spur removal -still having decreased sensation in R digits 1-3 -[facility], Dr. Dutch Quint -also noted MRI low back with DDD -se[facility], Dr. Abigail Miyamoto  Tobacco use: -smoking 1ppd since age 67. -trying to quit -using nicotine pouches x 1 wk  HLD: -taking atorvostatin 40 mg  HTN: -Taking lisinopril 10 mg daily  Neuropathy: -Taking gabapentin 600 mg 3 times daily x6 months -Numbness, tingling in bilateral feet  Past Medical History:  Diagnosis Date  . Anxiety   . Arthritis   . Atrial fibrillation (HCC)    a. paroxysmal, uses PRN Flecainide  . Depression   . Diabetes mellitus without complication (HCC)   . Dysrhythmia   . GERD (gastroesophageal reflux disease)   . Hypertension   . Obesity   . PCOS (polycystic ovarian syndrome)   . UTI (lower urinary tract infection)     Allergies  Allergen Reactions  . Sulfa Antibiotics Hives    ROS General: Denies fever, chills, night sweats, changes in weight, changes in appetite HEENT: Denies headaches, ear pain, changes in vision, rhinorrhea, sore throat CV: Denies CP, palpitations, SOB, orthopnea Pulm: Denies SOB, cough, wheezing GI: Denies abdominal pain, nausea, vomiting, diarrhea, constipation GU: Denies dysuria, hematuria, frequency, vaginal discharge Msk: Denies muscle cramps, joint pains  +low back pain Neuro: Denies weakness, numbness, tingling   +numbness/tingling in b/l feet and UEs Skin: Denies rashes, bruising Psych: Denies depression, anxiety, hallucinations    Objective:    Blood pressure (!) 128/92, pulse 86, temperature 98.6 F (37 C), temperature source Oral, height 5\' 8"  (1.727 m), weight 255 lb (115.7 kg), SpO2 98 %.  Gen. Pleasant, well-nourished, in no distress, normal affect  HEENT: Odon/AT, face symmetric, no scleral icterus, PERRLA, nares patent without drainage Lungs: no accessory muscle use, CTAB, no wheezes or rales Cardiovascular: RRR, no m/r/g, no peripheral edema Musculoskeletal: No deformities, no cyanosis or clubbing, normal tone Neuro:  A&Ox3, CN II-XII intact, normal gait Skin:  Warm, no lesions/ rash   Wt Readings from Last 3 Encounters:  06/19/18 255 lb (115.7 kg)  12/26/17 260 lb 4 oz (118 kg)  09/19/17 257 lb 4 oz (116.7 kg)    Lab Results  Component Value Date   WBC 10.2 12/26/2017   HGB 16.0 (H) 06/13/2018   HCT 47.0 (H) 06/13/2018   PLT 324.0 12/26/2017   GLUCOSE 313 (H) 06/13/2018   CHOL 204 (H) 08/22/2017   TRIG 302.0 (H) 08/22/2017   HDL 27.60 (L) 08/22/2017   LDLDIRECT 140.0 08/22/2017   ALT 40 11/20/2015   AST 42 (H) 11/20/2015   NA 134 (L) 06/13/2018   K 4.1 06/13/2018   CL 100 06/13/2018   CREATININE 0.50 06/13/2018   BUN 12 06/13/2018   CO2 29 12/26/2017   TSH 1.079 05/18/2016   INR 1.00 06/13/2018   HGBA1C 9.2 (H) 12/26/2017   MICROALBUR 7.4 (H) 12/26/2017  Assessment/Plan:  Essential hypertension -Elevated -Continue lisinopril 10 mg daily -Discussed may need to adjust dose given elevated diastolic -Patient encouraged to check BP at home and keep a log to bring with her clinic -Encouraged to reduce sodium intake  Atrial fibrillation with RVR (HCC) -Stable -Continue Eliquis and diazepam as prescribed by cardiology -continue f/u with Cards  Type 2 diabetes mellitus with diabetic neuropathy, unspecified whether long term insulin use (HCC)  -pt advised will  likely need to restart additional medication depending on Hgb A1C -discussed the importance of compliance -lifestyle modifications encouraged -Continue gabapentin 600 mg 3 times daily for neuropathy -Given handout - Plan: Hemoglobin A1c    Tobacco use disorder -Section greater than 3 minutes, less than 10 minutes -Smoking 1 pack/day -Nicotine pouches -We will readdress at each visit  Hyperlipidemia, unspecified hyperlipidemia type -Continue atorvastatin -Lifestyle modifications  History of degenerative disc disease -[facility] and pain [facility].  -F/u in 1 month  Abbe AmsterdamShannon Arin Vanosdol, MD

## 2018-06-19 NOTE — Patient Instructions (Addendum)
Coping With Diabetes Diabetes (type 1 diabetes mellitus or type 2 diabetes mellitus) is a condition in which the body does not have enough of a hormone called insulin, or the body does not respond properly to insulin. Normally, insulin allows sugars (glucose) to enter cells in the body. The cells use glucose for energy. With diabetes, extra glucose builds up in the blood instead of going into cells, which results in high blood glucose (hyperglycemia). How to manage lifestyle changes Managing diabetes includes medical treatments as well as lifestyle changes. If diabetes is not managed well, serious physical and emotional complications can occur. Taking good care of yourself means that you are responsible for:  Monitoring glucose regularly.  Eating a healthy diet.  Exercising regularly.  Meeting with health care providers.  Taking medicines as directed.  Some people may feel a lot of stress about managing their diabetes. This is known as emotional distress, and it is very common. Living with diabetes can place you at risk for emotional distress, depression, or anxiety. These disorders can be confusing and can make diabetes management more difficult. How to recognize stress Emotional distress Symptoms of emotional distress include:  Anger about having a diagnosis of diabetes.  Fear or frustration about your diagnosis and the changes you need to make to manage the condition.  Being overly worried about the care that you need or the cost of the care you need.  Feeling like you caused your condition by doing something wrong.  Fear of unpredictable situations, like low or high blood glucose.  Feeling judged by your health care providers.  Feeling very alone with the disease.  Getting too tired or "burned out" with the demands of daily care.  Depression Having diabetes means that you are at a higher risk for depression. Having depression also means that you are at a higher risk for  diabetes. Your health care provider may test (screen) you for symptoms of depression. It is important to recognize depression symptoms and to start treatment for it soon after it is diagnosed. The following are some symptoms of depression:  Loss of interest in things that you used to enjoy.  Trouble sleeping, or often waking up early and not being able to get back to sleep.  A change in appetite.  Feeling tired most of the day.  Feeling nervous and anxious.  Feeling guilty and worrying that you are a burden to others.  Feeling depressed more often than you do not feel that way.  Thoughts of hurting yourself or feeling that you want to die.  If you have any of these symptoms for 2 weeks or longer, reach out to a health care provider. Where to find support  Ask your health care provider to recommend a therapist who understands both depression and diabetes.  Search for information and support from the American Diabetes Association: www.diabetes.org  Find a certified diabetes educator and make an appointment through [place] [employer] Diabetes Educators: www.diabeteseducator.org Follow these instructions at home: Managing emotional distress The following are some ways to manage emotional distress:  Talk with your health care provider or certified diabetes educator. Consider working with a counselor or therapist.  Learn as much as you can about diabetes and its treatment. Meet with a certified diabetes educator or take a class to learn how to manage your condition.  Keep a journal of your thoughts and concerns.  Accept that some things are out of your control.  Talk with other people who have diabetes. It  can help to talk with others about the emotional distress that you feel.  Find ways to manage stress that work for you. These may include art or music therapy, exercise, meditation, and hobbies.  Seek support from spiritual leaders, family, and friends.  General  instructions  Follow your diabetes management plan.  Keep all follow-up visits as told by your health care provider. This is important. Get help right away if:  You have thoughts about hurting yourself or others. If you ever feel like you may hurt yourself or others, or have thoughts about taking your own life, get help right away. You can go to your nearest emergency department or call:  Your local emergency services (911 in the [place]  A suicide crisis helpline, such as the National Suicide Prevention Lifeline at 651-040-28361-934-799-4579. This is open 24 hours a day.  Summary  Diabetes (type 1 diabetes mellitus or type 2 diabetes mellitus) is a condition in which the body does not have enough of a hormone called insulin, or the body does not respond properly to insulin.  Living with diabetes puts you at risk for medical issues, and it also puts you at risk for emotional issues such as emotional distress, depression, and anxiety.  Recognizing the symptoms of emotional distress and depression may help you avoid problems with your diabetes control. It is important to start treatment for emotional distress and depression soon after they are diagnosed.  Having diabetes means that you are at a higher risk for depression. Ask your health care provider to recommend a therapist who understands both depression and diabetes.  If you experience symptoms of emotional distress or depression, it is important to discuss this with your health care provider, certified diabetes educator, or therapist. This information is not intended to replace advice given to you by your health care provider. Make sure you discuss any questions you have with your health care provider. Document Released: 11/25/2016 Document Revised: 11/25/2016 Document Reviewed: 11/25/2016 Elsevier Interactive Patient Education  2018 [employer]  Diabetic Neuropathy Diabetic neuropathy is a nerve disease or nerve damage that is caused by  diabetes mellitus. About half of all people with diabetes mellitus have some form of nerve damage. Nerve damage is more common in those who have had diabetes mellitus for many years and who generally have not had good control of their blood sugar (glucose) level. Diabetic neuropathy is a common complication of diabetes mellitus. There are three common types of diabetic neuropathy and a fourth type that is less common and less understood:  Peripheral neuropathy-This is the most common type of diabetic neuropathy. It causes damage to the nerves of the feet and legs first and then eventually the hands and arms. The damage affects the ability to sense touch.  Autonomic neuropathy-This type causes damage to the autonomic nervous system, which controls the following functions: ? Heartbeat. ? Body temperature. ? Blood pressure. ? Urination. ? Digestion. ? Sweating. ? Sexual function.  Focal neuropathy-Focal neuropathy can be painful and unpredictable and occurs most often in older adults with diabetes mellitus. It involves a specific nerve or one area and often comes on suddenly. It usually does not cause long-term problems.  Radiculoplexus neuropathy- Sometimes called lumbosacral radiculoplexus neuropathy, radiculoplexus neuropathy affects the nerves of the thighs, hips, buttocks, or legs. It is more common in people with type 2 diabetes mellitus and in older men. It is characterized by debilitating pain, weakness, and atrophy, usually in the thigh muscles.  What are the causes? The  cause of peripheral, autonomic, and focal neuropathies is diabetes mellitus that is uncontrolled and high glucose levels. The cause of radiculoplexus neuropathy is unknown. However, it is thought to be caused by inflammation related to uncontrolled glucose levels. What are the signs or symptoms? Peripheral Neuropathy Peripheral neuropathy develops slowly over time. When the nerves of the feet and legs no longer work there  may be:  Burning, stabbing, or aching pain in the legs or feet.  Inability to feel pressure or pain in your feet. This can lead to: ? Thick calluses over pressure areas. ? Pressure sores. ? Ulcers.  Foot deformities.  Reduced ability to feel temperature changes.  Muscle weakness.  Autonomic Neuropathy The symptoms of autonomic neuropathy vary depending on which nerves are affected. Symptoms may include:  Problems with digestion, such as: ? Feeling sick to your stomach (nausea). ? Vomiting. ? Bloating. ? Constipation. ? Diarrhea. ? Abdominal pain.  Difficulty with urination. This occurs if you lose your ability to sense when your bladder is full. Problems include: ? Urine leakage (incontinence). ? Inability to empty your bladder completely (retention).  Rapid or irregular heartbeat (palpitations).  Blood pressure drops when you stand up (orthostatic hypotension). When you stand up you may feel: ? Dizzy. ? Weak. ? Faint.  In men, inability to attain and maintain an erection.  In women, vaginal dryness and problems with decreased sexual desire and arousal.  Problems with body temperature regulation.  Increased or decreased sweating.  Focal Neuropathy  Abnormal eye movements or abnormal alignment of both eyes.  Weakness in the wrist.  Foot drop. This results in an inability to lift the foot properly and abnormal walking or foot movement.  Paralysis on one side of your face (Bell palsy).  Chest or abdominal pain. Radiculoplexus Neuropathy  Sudden, severe pain in your hip, thigh, or buttocks.  Weakness and wasting of thigh muscles.  Difficulty rising from a seated position.  Abdominal swelling.  Unexplained weight loss (usually more than 10 lb [4.5 kg]). How is this diagnosed? Peripheral Neuropathy Your senses may be tested. Sensory function testing can be done with:  A light touch using a monofilament.  A vibration with tuning fork.  A sharp  sensation with a pin prick.  Other tests that can help diagnose neuropathy are:  Nerve conduction velocity. This test checks the transmission of an electrical current through a nerve.  Electromyography. This shows how muscles respond to electrical signals transmitted by nearby nerves.  Quantitative sensory testing. This is used to assess how your nerves respond to vibrations and changes in temperature.  Autonomic Neuropathy Diagnosis is often based on reported symptoms. Tell your health care provider if you experience:  Dizziness.  Constipation.  Diarrhea.  Inappropriate urination or inability to urinate.  Inability to get or maintain an erection.  Tests that may be done include:  Electrocardiography or Holter monitor. These are tests that can help show problems with the heart rate or heart rhythm.  An X-ray exam may be done.  Focal Neuropathy Diagnosis is made based on your symptoms and what your health care provider finds during your exam. Other tests may be done. They may include:  Nerve conduction velocities. This checks the transmission of electrical current through a nerve.  Electromyography. This shows how muscles respond to electrical signals transmitted by nearby nerves.  Quantitative sensory testing. This test is used to assess how your nerves respond to vibration and changes in temperature.  Radiculoplexus Neuropathy  Often the first thing  is to eliminate any other issue or problems that might be the cause, as there is no standard test for diagnosis.  X-ray exam of your spine and lumbar region.  Spinal tap to rule out cancer.  MRI to rule out other lesions. How is this treated? Once nerve damage occurs, it cannot be reversed. The goal of treatment is to keep the disease or nerve damage from getting worse and affecting more nerve fibers. Controlling your blood glucose level is the key. Most people with radiculoplexus neuropathy see at least a partial  improvement over time. You will need to keep your blood glucose and HbA1c levels in the target range determined by your health care provider. Things that help control blood glucose levels include:  Blood glucose monitoring.  Meal planning.  Physical activity.  Diabetes medicine.  Over time, maintaining lower blood glucose levels helps lessen symptoms. Sometimes, prescription pain medicine is needed. Follow these instructions at home:  Do not smoke.  Keep your blood glucose level in the range that you and your health care provider have determined acceptable for you.  Keep your blood pressure level in the range that you and your health care provider have determined acceptable for you.  Eat a well-balanced diet.  Be physically active every day. Include strength training and balance exercises.  Protect your feet. ? Check your feet every day for sores, cuts, blisters, or signs of infection. ? Wear padded socks and supportive shoes. Use orthotic inserts, if necessary. ? Regularly check the insides of your shoes for worn spots. Make sure there are no rocks or other items inside your shoes before you put them on. Contact a health care provider if:  You have burning, stabbing, or aching pain in the legs or feet.  You are unable to feel pressure or pain in your feet.  You develop problems with digestion such as: ? Nausea. ? Vomiting. ? Bloating. ? Constipation. ? Diarrhea. ? Abdominal pain.  You have difficulty with urination, such as: ? Incontinence. ? Retention.  You have palpitations.  You develop orthostatic hypotension. When you stand up you may feel: ? Dizzy. ? Weak. ? Faint.  You cannot attain and maintain an erection (in men).  You have vaginal dryness and problems with decreased sexual desire and arousal (in women).  You have severe pain in your thighs, legs, or buttocks.  You have unexplained weight loss. This information is not intended to replace advice  given to you by your health care provider. Make sure you discuss any questions you have with your health care provider. Document Released: 09/20/2001 Document Revised: 12/18/2015 Document Reviewed: 12/21/2012 Elsevier Interactive Patient Education  2017 Elsevier Inc. Atrial Fibrillation Atrial fibrillation is a type of irregular or rapid heartbeat (arrhythmia). In atrial fibrillation, the heart quivers continuously in a chaotic pattern. This occurs when parts of the heart receive disorganized signals that make the heart unable to pump blood normally. This can increase the risk for stroke, heart failure, and other heart-related conditions. There are different types of atrial fibrillation, including:  Paroxysmal atrial fibrillation. This type starts suddenly, and it usually stops on its own shortly after it starts.  Persistent atrial fibrillation. This type often lasts longer than a week. It may stop on its own or with treatment.  Long-lasting persistent atrial fibrillation. This type lasts longer than 12 months.  Permanent atrial fibrillation. This type does not go away.  Talk with your health care provider to learn about the type of atrial fibrillation that  you have. What are the causes? This condition is caused by some heart-related conditions or procedures, including:  A heart attack.  Coronary artery disease.  Heart failure.  Heart valve conditions.  High blood pressure.  Inflammation of the sac that surrounds the heart (pericarditis).  Heart surgery.  Certain heart rhythm disorders, such as Wolf-Parkinson-White syndrome.  Other causes include:  Pneumonia.  Obstructive sleep apnea.  Blockage of an artery in the lungs (pulmonary embolism, or PE).  Lung cancer.  Chronic lung disease.  Thyroid problems, especially if the thyroid is overactive (hyperthyroidism).  Caffeine.  Excessive alcohol use or illegal drug use.  Use of some medicines, including certain  decongestants and diet pills.  Sometimes, the cause cannot be found. What increases the risk? This condition is more likely to develop in:  People who are older in age.  People who smoke.  People who have diabetes mellitus.  People who are overweight (obese).  Athletes who exercise vigorously.  What are the signs or symptoms? Symptoms of this condition include:  A feeling that your heart is beating rapidly or irregularly.  A feeling of discomfort or pain in your chest.  Shortness of breath.  Sudden light-headedness or weakness.  Getting tired easily during exercise.  In some cases, there are no symptoms. How is this diagnosed? Your health care provider may be able to detect atrial fibrillation when taking your pulse. If detected, this condition may be diagnosed with:  An electrocardiogram (ECG).  A Holter monitor test that records your heartbeat patterns over a 24-hour period.  Transthoracic echocardiogram (TTE) to evaluate how blood flows through your heart.  Transesophageal echocardiogram (TEE) to view more detailed images of your heart.  A stress test.  Imaging tests, such as a CT scan or chest X-ray.  Blood tests.  How is this treated? The main goals of treatment are to prevent blood clots from forming and to keep your heart beating at a normal rate and rhythm. The type of treatment that you receive depends on many factors, such as your underlying medical conditions and how you feel when you are experiencing atrial fibrillation. This condition may be treated with:  Medicine to slow down the heart rate, bring the heart's rhythm back to normal, or prevent clots from forming.  Electrical cardioversion. This is a procedure that resets your heart's rhythm by delivering a controlled, low-energy shock to the heart through your skin.  Different types of ablation, such as catheter ablation, catheter ablation with pacemaker, or surgical ablation. These procedures  destroy the heart tissues that send abnormal signals. When the pacemaker is used, it is placed under your skin to help your heart beat in a regular rhythm.  Follow these instructions at home:  Take over-the counter and prescription medicines only as told by your health care provider.  If your health care provider prescribed a blood-thinning medicine (anticoagulant), take it exactly as told. Taking too much blood-thinning medicine can cause bleeding. If you do not take enough blood-thinning medicine, you will not have the protection that you need against stroke and other problems.  Do not use tobacco products, including cigarettes, chewing tobacco, and e-cigarettes. If you need help quitting, ask your health care provider.  If you have obstructive sleep apnea, manage your condition as told by your health care provider.  Do not drink alcohol.  Do not drink beverages that contain caffeine, such as coffee, soda, and tea.  Maintain a healthy weight. Do not use diet pills unless your health  care provider approves. Diet pills may make heart problems worse.  Follow diet instructions as told by your health care provider.  Exercise regularly as told by your health care provider.  Keep all follow-up visits as told by your health care provider. This is important. How is this prevented?  Avoid drinking beverages that contain caffeine or alcohol.  Avoid certain medicines, especially medicines that are used for breathing problems.  Avoid certain herbs and herbal medicines, such as those that contain ephedra or ginseng.  Do not use illegal drugs, such as cocaine and amphetamines.  Do not smoke.  Manage your high blood pressure. Contact a health care provider if:  You notice a change in the rate, rhythm, or strength of your heartbeat.  You are taking an anticoagulant and you notice increased bruising.  You tire more easily when you exercise or exert yourself. Get help right away if:  You  have chest pain, abdominal pain, sweating, or weakness.  You feel nauseous.  You notice blood in your vomit, bowel movement, or urine.  You have shortness of breath.  You suddenly have swollen feet and ankles.  You feel dizzy.  You have sudden weakness or numbness of the face, arm, or leg, especially on one side of the body.  You have trouble speaking, trouble understanding, or both (aphasia).  Your face or your eyelid droops on one side. These symptoms may represent a serious problem that is an emergency. Do not wait to see if the symptoms will go away. Get medical help right away. Call your local emergency services (911 in the [place] Do not drive yourself to the hospital. This information is not intended to replace advice given to you by your health care provider. Make sure you discuss any questions you have with your health care provider. Document Released: 07/12/2005 Document Revised: 11/19/2015 Document Reviewed: 11/06/2014 Elsevier Interactive Patient Education  2018 [employer].  Steps to Quit Smoking Smoking tobacco can be bad for your health. It can also affect almost every organ in your body. Smoking puts you and people around you at risk for many serious long-lasting (chronic) diseases. Quitting smoking is hard, but it is one of the best things that you can do for your health. It is never too late to quit. What are the benefits of quitting smoking? When you quit smoking, you lower your risk for getting serious diseases and conditions. They can include:  Lung cancer or lung disease.  Heart disease.  Stroke.  Heart attack.  Not being able to have children (infertility).  Weak bones (osteoporosis) and broken bones (fractures).  If you have coughing, wheezing, and shortness of breath, those symptoms may get better when you quit. You may also get sick less often. If you are pregnant, quitting smoking can help to lower your chances of having a baby of low birth  weight. What can I do to help me quit smoking? Talk with your doctor about what can help you quit smoking. Some things you can do (strategies) include:  Quitting smoking totally, instead of slowly cutting back how much you smoke over a period of time.  Going to in-person counseling. You are more likely to quit if you go to many counseling sessions.  Using resources and support systems, such as: ? Agricultural engineer with a Veterinary surgeon. ? Phone quitlines. ? Automotive engineer. ? Support groups or group counseling. ? Text messaging programs. ? Mobile phone apps or applications.  Taking medicines. Some of these medicines may have  nicotine in them. If you are pregnant or breastfeeding, do not take any medicines to quit smoking unless your doctor says it is okay. Talk with your doctor about counseling or other things that can help you.  Talk with your doctor about using more than one strategy at the same time, such as taking medicines while you are also going to in-person counseling. This can help make quitting easier. What things can I do to make it easier to quit? Quitting smoking might feel very hard at first, but there is a lot that you can do to make it easier. Take these steps:  Talk to your family and friends. Ask them to support and encourage you.  Call phone quitlines, reach out to support groups, or work with a Veterinary surgeon.  Ask people who smoke to not smoke around you.  Avoid places that make you want (trigger) to smoke, such as: ? Bars. ? Parties. ? Smoke-break areas at work.  Spend time with people who do not smoke.  Lower the stress in your life. Stress can make you want to smoke. Try these things to help your stress: ? Getting regular exercise. ? Deep-breathing exercises. ? Yoga. ? Meditating. ? Doing a body scan. To do this, close your eyes, focus on one area of your body at a time from head to toe, and notice which parts of your body are tense. Try to relax the  muscles in those areas.  Download or buy apps on your mobile phone or tablet that can help you stick to your quit plan. There are many free apps, such as QuitGuide from the [employer] developer for Disease Control and Prevention). You can find more support from smokefree.gov and other websites.  This information is not intended to replace advice given to you by your health care provider. Make sure you discuss any questions you have with your health care provider. Document Released: 05/08/2009 Document Revised: 03/09/2016 Document Reviewed: 11/26/2014 Elsevier Interactive Patient Education  2018 [employer].

## 2018-06-23 ENCOUNTER — Encounter: Payer: Self-pay | Admitting: Family Medicine

## 2018-06-25 ENCOUNTER — Other Ambulatory Visit: Payer: Self-pay | Admitting: Family Medicine

## 2018-06-25 DIAGNOSIS — E1140 Type 2 diabetes mellitus with diabetic neuropathy, unspecified: Secondary | ICD-10-CM

## 2018-06-26 NOTE — Telephone Encounter (Signed)
Patient transferred to Dr. Salomon FickBanks

## 2018-07-09 ENCOUNTER — Other Ambulatory Visit: Payer: Self-pay | Admitting: Family Medicine

## 2018-07-09 DIAGNOSIS — E785 Hyperlipidemia, unspecified: Secondary | ICD-10-CM

## 2018-07-09 DIAGNOSIS — E1169 Type 2 diabetes mellitus with other specified complication: Secondary | ICD-10-CM

## 2018-07-10 ENCOUNTER — Other Ambulatory Visit: Payer: Self-pay | Admitting: Student

## 2018-07-10 DIAGNOSIS — I480 Paroxysmal atrial fibrillation: Secondary | ICD-10-CM

## 2018-07-14 ENCOUNTER — Telehealth: Payer: Self-pay

## 2018-07-14 NOTE — Telephone Encounter (Signed)
Copied from CRM [phone]. Topic: General - Other >> Jul 14, 2018 12:45 PM Tamela OddiHarris, Brenda J wrote: Reason for CRM: Patient called to speak with the doctor or nurse regarding her A1C results.  Patient stated that she would like to know the next steps she can take to get the numbers down.  Please advise and call patient back at [phone]

## 2018-07-14 NOTE — Telephone Encounter (Signed)
Spoke with pt voiced understanding of Dr Salomon FickBanks instructions on her Insulin intake. Pt is scheduled on 07/31/2017 for a f/u

## 2018-07-31 ENCOUNTER — Ambulatory Visit: Payer: 59 | Admitting: Family Medicine

## 2018-07-31 ENCOUNTER — Encounter: Payer: Self-pay | Admitting: Family Medicine

## 2018-07-31 VITALS — BP 140/98 | HR 94 | Temp 98.2°F | Wt 253.0 lb

## 2018-07-31 DIAGNOSIS — Z794 Long term (current) use of insulin: Secondary | ICD-10-CM

## 2018-07-31 DIAGNOSIS — R400 Somnolence: Secondary | ICD-10-CM

## 2018-07-31 DIAGNOSIS — E1140 Type 2 diabetes mellitus with diabetic neuropathy, unspecified: Secondary | ICD-10-CM | POA: Diagnosis not present

## 2018-07-31 DIAGNOSIS — N912 Amenorrhea, unspecified: Secondary | ICD-10-CM

## 2018-07-31 DIAGNOSIS — I10 Essential (primary) hypertension: Secondary | ICD-10-CM

## 2018-07-31 DIAGNOSIS — F17210 Nicotine dependence, cigarettes, uncomplicated: Secondary | ICD-10-CM

## 2018-07-31 DIAGNOSIS — E282 Polycystic ovarian syndrome: Secondary | ICD-10-CM

## 2018-07-31 LAB — CBC WITH DIFFERENTIAL/PLATELET
Basophils Absolute: 0.1 10*3/uL (ref 0.0–0.1)
Basophils Relative: 0.8 % (ref 0.0–3.0)
Eosinophils Absolute: 0.3 10*3/uL (ref 0.0–0.7)
Eosinophils Relative: 3 % (ref 0.0–5.0)
HCT: 47 % — ABNORMAL HIGH (ref 36.0–46.0)
Hemoglobin: 16.1 g/dL — ABNORMAL HIGH (ref 12.0–15.0)
Lymphocytes Relative: 20.4 % (ref 12.0–46.0)
Lymphs Abs: 2.1 10*3/uL (ref 0.7–4.0)
MCHC: 34.4 g/dL (ref 30.0–36.0)
MCV: 90.5 fl (ref 78.0–100.0)
Monocytes Absolute: 0.5 10*3/uL (ref 0.1–1.0)
Monocytes Relative: 5 % (ref 3.0–12.0)
Neutro Abs: 7.3 10*3/uL (ref 1.4–7.7)
Neutrophils Relative %: 70.8 % (ref 43.0–77.0)
Platelets: 294 10*3/uL (ref 150.0–400.0)
RBC: 5.19 Mil/uL — ABNORMAL HIGH (ref 3.87–5.11)
RDW: 13.9 % (ref 11.5–15.5)
WBC: 10.3 10*3/uL (ref 4.0–10.5)

## 2018-07-31 LAB — BASIC METABOLIC PANEL
BUN: 13 mg/dL (ref 6–23)
CO2: 27 mEq/L (ref 19–32)
Calcium: 9.8 mg/dL (ref 8.4–10.5)
Chloride: 98 mEq/L (ref 96–112)
Creatinine, Ser: 0.79 mg/dL (ref 0.40–1.20)
GFR: 84.83 mL/min (ref >60.00)
Glucose, Bld: 384 mg/dL — ABNORMAL HIGH (ref 70–99)
Potassium: 4.3 mEq/L (ref 3.5–5.1)
Sodium: 136 mEq/L (ref 135–145)

## 2018-07-31 LAB — POCT URINE PREGNANCY: Preg Test, Ur: NEGATIVE

## 2018-07-31 LAB — TSH: TSH: 1.28 u[IU]/mL (ref 0.35–4.50)

## 2018-07-31 LAB — T4, FREE: Free T4: 1.12 ng/dL (ref 0.60–1.60)

## 2018-07-31 NOTE — Patient Instructions (Signed)
Diabetic Neuropathy Diabetic neuropathy refers to nerve damage that is caused by diabetes (diabetes mellitus). Over time, people with diabetes can develop nerve damage throughout the body. There are several types of diabetic neuropathy:  Peripheral neuropathy. This is the most common type of diabetic neuropathy. It causes damage to nerves that carry signals between the spinal cord and other parts of the body (peripheral nerves). This usually affects nerves in the feet and legs first, and may eventually affect the hands and arms. The damage affects the ability to sense touch or temperature.  Autonomic neuropathy. This type causes damage to nerves that control involuntary functions (autonomic nerves). These nerves carry signals that control: ? Heartbeat. ? Body temperature. ? Blood pressure. ? Urination. ? Digestion. ? Sweating. ? Sexual function. ? Response to changing blood sugar (glucose) levels.  Focal neuropathy. This type of nerve damage affects one area of the body, such as an arm, a leg, or the face. The injury may involve one nerve or a small group of nerves. Focal neuropathy can be painful and unpredictable, and occurs most often in older adults with diabetes. This often develops suddenly, but usually improves over time and does not cause long-term problems.  Proximal neuropathy. This type of nerve damage affects the nerves of the thighs, hips, buttocks, or legs. It causes severe pain, weakness, and muscle death (atrophy), usually in the thigh muscles. It is more common among older men and people who have type 2 diabetes. The length of recovery time may vary. What are the causes? Peripheral, autonomic, and focal neuropathies are caused by diabetes that is not well controlled with treatment. The cause of proximal neuropathy is not known, but it may be caused by inflammation related to uncontrolled blood glucose levels. What are the signs or symptoms? Peripheral neuropathy Peripheral  neuropathy develops slowly over time. When the nerves of the feet and legs no longer work, you may experience:  Burning, stabbing, or aching pain in the legs or feet.  Pain or cramping in the legs or feet.  Loss of feeling (numbness) and inability to feel pressure or pain in the feet. This can lead to: ? Thick calluses or sores on areas of constant pressure. ? Ulcers. ? Reduced ability to feel temperature changes.  Foot deformities.  Muscle weakness.  Loss of balance or coordination. Autonomic neuropathy The symptoms of autonomic neuropathy vary depending on which nerves are affected. Symptoms may include:  Problems with digestion, such as: ? Nausea or vomiting. ? Poor appetite. ? Bloating. ? Diarrhea or constipation. ? Trouble swallowing. ? Losing weight without trying to.  Problems with the heart, blood and lungs, such as: ? Dizziness, especially when standing up. ? Fainting. ? Shortness of breath. ? Irregular heartbeat.  Bladder problems, such as: ? Trouble starting or stopping urination. ? Leaking urine. ? Trouble emptying the bladder. ? Urinary tract infections (UTIs).  Problems with other body functions, such as: ? Sweat. You may sweat too much or too little. ? Temperature. You might get hot easily. Or, you might feel cold more than usual. ? Sexual function. Men may not be able to get or maintain an erection. Women may have vaginal dryness and difficulty with arousal. Focal neuropathy Symptoms affect only one area of the body. Common symptoms include:  Numbness.  Tingling.  Burning pain.  Prickling feeling.  Very sensitive skin.  Weakness.  Inability to move (paralysis).  Muscle twitching.  Muscles getting smaller (wasting).  Poor coordination.  Double or blurred vision. Proximal  neuropathy  Sudden, severe pain in the hip, thigh, or buttocks. Pain may spread from the back into the legs (sciatica).  Pain and numbness in the arms and  legs.  Tingling.  Loss of bladder control or bowel control.  Weakness and wasting of thigh muscles.  Difficulty getting up from a seated position.  Abdominal swelling.  Unexplained weight loss. How is this diagnosed? Diagnosis usually involves reviewing your medical history and any symptoms you have. Diagnosis varies depending on the type of neuropathy your health care provider suspects. Peripheral neuropathy Your health care provider will check areas that are affected by your nervous system (neurologic exam), such as your reflexes, how you move, and what you can feel. You may have other tests, such as:  Blood tests.  Removal and examination of fluid that surrounds the spinal cord (lumbar puncture).  CT scan.  MRI.  A test to check the nerves that control muscles (electromyogram, EMG).  Tests of how quickly messages pass through your nerves (nerve conduction velocity tests).  Removal of a small piece of nerve to be examined under a microscope (biopsy). Autonomic neuropathy You may have tests, such as:  Tests to measure your blood pressure and heart rate. This may include monitoring you while you are safely secured to an exam table that moves you from a lying position to an upright position (table tilt test).  Breathing tests to check your lungs.  Tests to check how food moves through the digestive system (gastric emptying tests).  Blood, sweat, or urine tests.  Ultrasound of your bladder.  Spinal fluid tests. Focal neuropathy This condition may be diagnosed with:  A neurologic exam.  CT scan.  MRI.  EMG.  Nerve conduction velocity tests. Proximal neuropathy There is no test to diagnose this type of neuropathy. You may have tests to rule out other possible causes of this type of neuropathy. Tests may include:  X-rays of your spine and lumbar region.  Lumbar puncture.  MRI. How is this treated? The goal of treatment is to keep nerve damage from getting  worse. The most important part of treatment is keeping your blood glucose level and your A1C level within your target range by following your diabetes management plan. Over time, maintaining lower blood glucose levels helps lessen symptoms. In some cases, you may need prescription pain medicine. Follow these instructions at home:  Lifestyle   Do not use any products that contain nicotine or tobacco, such as cigarettes and e-cigarettes. If you need help quitting, ask your health care provider.  Be physically active every day. Include strength training and balance exercises.  Follow a healthy meal plan.  Work with your health care provider to manage your blood pressure. General instructions  Follow your diabetes management plan as directed. ? Check your blood glucose levels as directed by your health care provider. ? Keep your blood glucose in your target range as directed by your health care provider. ? Have your A1C level checked at least two times a year, or as often as told by your health care provider.  Take over the counter and prescription medicines only as told by your health care provider. This includes insulin and diabetes medicine.  Do not drive or use heavy machinery while taking prescription pain medicines.  Check your skin and feet every day for cuts, bruises, redness, blisters, or sores.  Keep all follow up visits as told by your health care provider. This is important. Contact a health care provider if:  You have burning, stabbing, or aching pain in your legs or feet.  You are unable to feel pressure or pain in your feet.  You develop problems with digestion, such as: ? Nausea. ? Vomiting. ? Bloating. ? Constipation. ? Diarrhea. ? Abdominal pain.  You have difficulty with urination, such as inability: ? To control when you urinate (incontinence). ? To completely empty the bladder (retention).  You have palpitations.  You feel dizzy, weak, or faint when you  stand up. Get help right away if:  You cannot urinate.  You have sudden weakness or loss of coordination.  You have trouble speaking.  You have pain or pressure in your chest.  You have an irregular heart beat.  You have sudden inability to move a part of your body. Summary  Diabetic neuropathy refers to nerve damage that is caused by diabetes. It can affect nerves throughout the entire body, causing numbness and pain in the arms, legs, digestive tract, heart, and other body systems.  Keep your blood glucose level and your blood pressure in your target range, as directed by your health care provider. This can help prevent neuropathy from getting worse.  Check your skin and feet every day for cuts, bruises, redness, blisters, or sores.  Do not use any products that contain nicotine or tobacco, such as cigarettes and e-cigarettes. If you need help quitting, ask your health care provider. This information is not intended to replace advice given to you by your health care provider. Make sure you discuss any questions you have with your health care provider. Document Released: 09/20/2001 Document Revised: 08/24/2017 Document Reviewed: 08/16/2016 Elsevier Interactive Patient Education  2019 [employer].  Diet for Polycystic Ovary Syndrome Polycystic ovary syndrome (PCOS) is a disorder of the chemicals (hormones) that regulate a woman's reproductive system, including monthly periods (menstruation). The condition causes important hormones to be out of balance. PCOS can:  Stop your periods or make them irregular.  Cause cysts to develop on your ovaries.  Make it difficult to get pregnant.  Stop your body from responding to the effects of insulin (insulin resistance). Insulin resistance can lead to obesity and diabetes. Changing what you eat can help you manage PCOS and improve your health. Following a balanced diet can help you lose weight and improve the way that your body uses  insulin. What are tips for following this plan?  Follow a balanced diet for meals and snacks. Eat breakfast, lunch, dinner, and one or two snacks every day.  Include protein in each meal and snack.  Choose whole grains instead of products that are made with refined flour.  Eat a variety of foods.  Exercise regularly as told by your health care provider. Aim to do 30 or more minutes of exercise on most days of the week.  If you are overweight or obese: ? Pay attention to how many calories you eat. Cutting down on calories can help you lose weight. ? Work with your health care provider or a diet and nutrition specialist (dietitian) to figure out how many calories you need each day. What foods can I eat?  Fruits Include a variety of colors and types. All fruits are helpful for PCOS. Vegetables Include a variety of colors and types. All vegetables are helpful for PCOS. Grains Whole grains, such as whole wheat. Whole-grain breads, crackers, cereals, and pasta. Unsweetened oatmeal, bulgur, barley, quinoa, and brown rice. Tortillas made from corn or whole-wheat flour. Meats and other proteins Low-fat (lean) proteins, such  as fish, chicken, beans, eggs, and tofu. Dairy Low-fat dairy products, such as skim milk, cheese sticks, and yogurt. Beverages Low-fat or fat-free drinks, such as water, low-fat milk, sugar-free drinks, and small amounts of 100% fruit juice. Seasonings and condiments Ketchup. Mustard. Barbecue sauce. Relish. Low-fat or fat-free mayonnaise. Fats and oils Olive oil or canola oil. Walnuts and almonds. The items listed above may not be a complete list of recommended foods and beverages. Contact a dietitian for more options. What foods are not recommended? Foods that are high in calories or fat. Fried foods. Sweets. Products that are made from refined white flour, including white bread, pastries, white rice, and pasta. The items listed above may not be a complete list of  foods and beverages to avoid. Contact a dietitian for more information. Summary  PCOS is a hormonal imbalance that affects a woman's reproductive system.  You can help to manage your PCOS by exercising regularly and eating a healthy, varied diet of vegetables, fruit, whole grains, low-fat (lean) protein, and low-fat dairy products.  Changing what you eat can improve the way that your body uses insulin, help your hormones reach normal levels, and help you lose weight. This information is not intended to replace advice given to you by your health care provider. Make sure you discuss any questions you have with your health care provider. Document Released: 11/03/2015 Document Revised: 05/16/2017 Document Reviewed: 05/16/2017 Elsevier Interactive Patient Education  2019 [employer].  Managing Your Hypertension Hypertension is commonly called high blood pressure. This is when the force of your blood pressing against the walls of your arteries is too strong. Arteries are blood vessels that carry blood from your heart throughout your body. Hypertension forces the heart to work harder to pump blood, and may cause the arteries to become narrow or stiff. Having untreated or uncontrolled hypertension can cause heart attack, stroke, kidney disease, and other problems. What are blood pressure readings? A blood pressure reading consists of a higher number over a lower number. Ideally, your blood pressure should be below 120/80. The first ("top") number is called the systolic pressure. It is a measure of the pressure in your arteries as your heart beats. The second ("bottom") number is called the diastolic pressure. It is a measure of the pressure in your arteries as the heart relaxes. What does my blood pressure reading mean? Blood pressure is classified into four stages. Based on your blood pressure reading, your health care provider may use the following stages to determine what type of treatment you need, if  any. Systolic pressure and diastolic pressure are measured in a unit called mm Hg. Normal  Systolic pressure: below 559.  Diastolic pressure: below 80. Elevated  Systolic pressure: 741-638.  Diastolic pressure: below 80. Hypertension stage 1  Systolic pressure: 453-646.  Diastolic pressure: 80-32. Hypertension stage 2  Systolic pressure: 122 or above.  Diastolic pressure: 90 or above. What health risks are associated with hypertension? Managing your hypertension is an important responsibility. Uncontrolled hypertension can lead to:  A heart attack.  A stroke.  A weakened blood vessel (aneurysm).  Heart failure.  Kidney damage.  Eye damage.  Metabolic syndrome.  Memory and concentration problems. What changes can I make to manage my hypertension? Hypertension can be managed by making lifestyle changes and possibly by taking medicines. Your health care provider will help you make a plan to bring your blood pressure within a normal range. Eating and drinking   Eat a diet that is high  in fiber and potassium, and low in salt (sodium), added sugar, and fat. An example eating plan is called the DASH (Dietary Approaches to Stop Hypertension) diet. To eat this way: ? Eat plenty of fresh fruits and vegetables. Try to fill half of your plate at each meal with fruits and vegetables. ? Eat whole grains, such as whole wheat pasta, brown rice, or whole grain bread. Fill about one quarter of your plate with whole grains. ? Eat low-fat diary products. ? Avoid fatty cuts of meat, processed or cured meats, and poultry with skin. Fill about one quarter of your plate with lean proteins such as fish, chicken without skin, beans, eggs, and tofu. ? Avoid premade and processed foods. These tend to be higher in sodium, added sugar, and fat.  Reduce your daily sodium intake. Most people with hypertension should eat less than 1,500 mg of sodium a day.  Limit alcohol intake to no more than 1  drink a day for nonpregnant women and 2 drinks a day for men. One drink equals 12 oz of beer, 5 oz of wine, or 1 oz of hard liquor. Lifestyle  Work with your health care provider to maintain a healthy body weight, or to lose weight. Ask what an ideal weight is for you.  Get at least 30 minutes of exercise that causes your heart to beat faster (aerobic exercise) most days of the week. Activities may include walking, swimming, or biking.  Include exercise to strengthen your muscles (resistance exercise), such as weight lifting, as part of your weekly exercise routine. Try to do these types of exercises for 30 minutes at least 3 days a week.  Do not use any products that contain nicotine or tobacco, such as cigarettes and e-cigarettes. If you need help quitting, ask your health care provider.  Control any long-term (chronic) conditions you have, such as high cholesterol or diabetes. Monitoring  Monitor your blood pressure at home as told by your health care provider. Your personal target blood pressure may vary depending on your medical conditions, your age, and other factors.  Have your blood pressure checked regularly, as often as told by your health care provider. Working with your health care provider  Review all the medicines you take with your health care provider because there may be side effects or interactions.  Talk with your health care provider about your diet, exercise habits, and other lifestyle factors that may be contributing to hypertension.  Visit your health care provider regularly. Your health care provider can help you create and adjust your plan for managing hypertension. Will I need medicine to control my blood pressure? Your health care provider may prescribe medicine if lifestyle changes are not enough to get your blood pressure under control, and if:  Your systolic blood pressure is 130 or higher.  Your diastolic blood pressure is 80 or higher. Take medicines  only as told by your health care provider. Follow the directions carefully. Blood pressure medicines must be taken as prescribed. The medicine does not work as well when you skip doses. Skipping doses also puts you at risk for problems. Contact a health care provider if:  You think you are having a reaction to medicines you have taken.  You have repeated (recurrent) headaches.  You feel dizzy.  You have swelling in your ankles.  You have trouble with your vision. Get help right away if:  You develop a severe headache or confusion.  You have unusual weakness or numbness, or you  feel faint.  You have severe pain in your chest or abdomen.  You vomit repeatedly.  You have trouble breathing. Summary  Hypertension is when the force of blood pumping through your arteries is too strong. If this condition is not controlled, it may put you at risk for serious complications.  Your personal target blood pressure may vary depending on your medical conditions, your age, and other factors. For most people, a normal blood pressure is less than 120/80.  Hypertension is managed by lifestyle changes, medicines, or both. Lifestyle changes include weight loss, eating a healthy, low-sodium diet, exercising more, and limiting alcohol. This information is not intended to replace advice given to you by your health care provider. Make sure you discuss any questions you have with your health care provider. Document Released: 04/05/2012 Document Revised: 06/09/2016 Document Reviewed: 06/09/2016 Elsevier Interactive Patient Education  2019 [employer].  Secondary Amenorrhea  Secondary amenorrhea occurs when a female who was previously having menstrual periods has not had them for 3-6 months. A menstrual period is the monthly shedding of the lining of the uterus. Menstruation involves the passing of blood, tissue, fluid, and mucus through the vagina. The flow of blood usually occurs during 3-7 consecutive  days each month. This condition has many causes. In many cases, treating the underlying cause will return menstrual periods back to a normal cycle. What are the causes? The most common cause of this condition is pregnancy. Other causes include:  Malnutrition.  Cirrhosis of the liver.  Conditions of the blood.  Diabetes.  Epilepsy.  Chronic kidney disease.  Polycystic ovary disease.  Stress or anxiety.  A hormonal imbalance.  Ovarian failure.  Medicines.  Extreme obesity.  Cystic fibrosis.  Low body weight or drastic weight loss.  Early menopause.  Removal of the ovaries or uterus.  Contraceptive pills, patches, or vaginal rings.  Cushing syndrome.  Thyroid problems. What increases the risk? You are more likely to develop this condition if:  You have a family history of this condition.  You have an eating disorder.  You do extreme athletic training.  You have a chronic disease.  You abuse substances such as alcohol or cigarettes. What are the signs or symptoms? The main symptom of this condition is a lack of menstrual periods for 3-6 months. How is this diagnosed? This condition may be diagnosed based on:  Your medical history.  A physical exam.  A pelvic exam to check for problems with your reproductive organs.  A procedure to examine the uterus.  A measurement of your body mass index (BMI).  Tests, such as: ? Blood tests that measure certain hormones in your body and rule out pregnancy. ? Urine tests. ? Imaging tests, such as an ultrasound, CT scan, or MRI. How is this treated? Treatment for this condition depends on the cause of the amenorrhea. It may involve:  Correcting dietary problems.  Treating underlying conditions.  Medicines.  Lifestyle changes.  Surgery. If the condition cannot be corrected, it is sometimes possible to trigger menstrual periods with medicines. Follow these instructions at home: Lifestyle  Maintain a  healthy diet. Ask to meet with a registered dietitian for nutrition counseling and meal planning.  Maintain a healthy weight. Talk to your health care provider before trying any new diet or exercise plan.  Exercise at least 30 minutes 5 or more days each week. Exercising includes brisk walking, yard work, biking, running, swimming, and team sports like basketball and soccer. Ask your health care provider  which exercises are safe for you.  Get enough sleep. Plan your sleep time to allow for 7-9 hours of sleep each night.  Learn to manage stress. Explore relaxation techniques such as meditation, journaling, yoga, or tai chi. General instructions  Be aware of changes in your menstrual cycle. Keep a record of when you have your menstrual period. Note the date your period starts, how long it lasts, and any problems you experience.  Take over-the-counter and prescription medicines only as told by your health care provider.  Keep all follow-up visits as told by your health care provider. This is important. Contact a health care provider if:  Your periods do not return to normal after treatment. Summary  Secondary amenorrhea is when a female who was previously having menstrual periods has not gotten her period for 3-6 months.  This condition has many causes. In many cases, treating the underlying cause will return menstrual periods back to a normal cycle.  Talk to your health care provider if your periods do not return to normal after treatment. This information is not intended to replace advice given to you by your health care provider. Make sure you discuss any questions you have with your health care provider. Document Released: 08/23/2006 Document Revised: 09/30/2016 Document Reviewed: 09/30/2016 Elsevier Interactive Patient Education  2019 [employer].

## 2018-08-03 DIAGNOSIS — E282 Polycystic ovarian syndrome: Secondary | ICD-10-CM | POA: Insufficient documentation

## 2018-08-03 NOTE — Progress Notes (Signed)
Subjective:    Patient ID: Julia Dickerson, female    DOB: September 10, 1975, 43 y.o.   MRN: [id number]  No chief complaint on file.   HPI Patient was seen today for chronic conditions.  Snoring: -sleep study x2 in 2014 -Does not have CPAP -Endorses snoring at night -Not getting restful sleep, tired during the day.  DM type II: -Taking metformin XR 750 mg twice daily and Basaglar 30 units nightly -Blood sugars 235, 226, 246, 94, 393, 329, 478 -In the past was also on glimepiride, but stopped taking it -Not currently seeing endocrinology -Endorses numbness/tingling in bilateral feet.  Taking gabapentin 600 mg -Increased foot pain at night  Nicotine dependence: -Still using nicotine patches -Endorses significant decrease in desire to smoke cigarettes  HTN: -Typically taking BP meds at 11 AM. -Has yet to take medicine this morning -Not checking BP at home -Taking lisinopril 10 mg daily  Regular menses/PCOS: -Notes history of PCOS -In the past was on birth control which regulated menses -Became pregnant x2 in 2011-2012 [place] -Notes sporadic menses every 6 months -Had pelvic u/s this yr Past Medical History:  Diagnosis Date  . Anxiety   . Arthritis   . Atrial fibrillation (HCC)    a. paroxysmal, uses PRN Flecainide  . Depression   . Diabetes mellitus without complication (HCC)   . Dysrhythmia   . GERD (gastroesophageal reflux disease)   . Hypertension   . Obesity   . PCOS (polycystic ovarian syndrome)   . UTI (lower urinary tract infection)     Allergies  Allergen Reactions  . Sulfa Antibiotics Hives    ROS General: Denies fever, chills, night sweats, changes in weight, changes in appetite + daytime sleepiness HEENT: Denies headaches, ear pain, changes in vision, rhinorrhea, sore throat CV: Denies CP, palpitations, SOB, orthopnea Pulm: Denies SOB, cough, wheezing  + snoring GI: Denies abdominal pain, nausea,  vomiting, diarrhea, constipation GU: Denies dysuria, hematuria, frequency, vaginal discharge  + infrequent menses  Msk: Denies muscle cramps, joint pains Neuro: Denies weakness, numbness, tingling Skin: Denies rashes, bruising Psych: Denies depression, anxiety, hallucinations    Objective:    Blood pressure (!) 140/98, pulse 94, temperature 98.2 F (36.8 C), temperature source Oral, weight 253 lb (114.8 kg), SpO2 97 %.  Gen. Pleasant, well-nourished, in no distress, normal affect   HEENT: Olds/AT, face symmetric, no scleral icterus, PERRLA, EOMI, nares patent without drainage Lungs: no accessory muscle use, CTAB, no wheezes or rales Cardiovascular: RRR, no m/r/g, no peripheral edema Abdomen: BS present, soft, NT/ND Neuro:  A&Ox3, CN II-XII intact, normal gait Skin:  Warm, no lesions/ rash   Wt Readings from Last 3 Encounters:  07/31/18 253 lb (114.8 kg)  06/19/18 255 lb (115.7 kg)  12/26/17 260 lb 4 oz (118 kg)    Lab Results  Component Value Date   WBC 10.3 07/31/2018   HGB 16.1 (H) 07/31/2018   HCT 47.0 (H) 07/31/2018   PLT 294.0 07/31/2018   GLUCOSE 384 (H) 07/31/2018   CHOL 204 (H) 08/22/2017   TRIG 302.0 (H) 08/22/2017   HDL 27.60 (L) 08/22/2017   LDLDIRECT 140.0 08/22/2017   ALT 40 11/20/2015   AST 42 (H) 11/20/2015   NA 136 07/31/2018   K 4.3 07/31/2018   CL 98 07/31/2018   CREATININE 0.79 07/31/2018   BUN 13 07/31/2018   CO2 27 07/31/2018   TSH 1.28 07/31/2018   INR 1.00 06/13/2018   HGBA1C 10.5 (H)  06/19/2018   MICROALBUR 7.4 (H) 12/26/2017    Assessment/Plan:  Daytime somnolence  -Concern for OSA given history of snoring and tiredness -Weight loss -We will refer for sleep study - Plan: Ambulatory referral to Pulmonology  Type 2 diabetes mellitus with diabetic neuropathy, with long-term current use of insulin (HCC) -Continue metformin XR 750 mg twice daily, Basaglar 30 units nightly -Discussed need for adjustments giving elevated a.m. blood sugars.   Compliance has been an issue in the past -Lifestyle modifications strongly encouraged - Plan: Ambulatory referral to Endocrinology  PCOS (polycystic ovarian syndrome) -Pelvic ultrasound 01/05/2018 with 2.7cm minimally complex left ovarian cyst.  Short interval follow-up in 6 to 12 weeks was recommended.   -Continue metformin XR 750 mg twice daily -We will have patient follow-up with OB/GYN. - Plan: TSH, T4, free, ambulatory referral to obstetrics/gynecology  Essential hypertension  -Elevated -Patient encouraged to take her medication this a.m. lisinopril 10 mg -Patient encouraged to decrease sodium intake and increase physical activity - Plan: Basic metabolic panel  Amenorrhea -Discussed possible causes -Given handout - Plan: Ambulatory referral to Obstetrics / Gynecology, TSH, T4, free, CBC with Differential/Platelet, POCT urine pregnancy  Cigarette nicotine dependence without complication -Smoking cessation counseling greater than 3 minutes, less than 10 minutes -Continue using nicotine patches -We will reevaluate  Follow-up PRN  Abbe AmsterdamShannon Karine Garn, MD

## 2018-08-07 ENCOUNTER — Telehealth: Payer: Self-pay | Admitting: Cardiology

## 2018-08-07 DIAGNOSIS — M5416 Radiculopathy, lumbar region: Secondary | ICD-10-CM | POA: Diagnosis not present

## 2018-08-07 DIAGNOSIS — M48062 Spinal stenosis, lumbar region with neurogenic claudication: Secondary | ICD-10-CM | POA: Diagnosis not present

## 2018-08-07 DIAGNOSIS — Z87891 Personal history of nicotine dependence: Secondary | ICD-10-CM | POA: Insufficient documentation

## 2018-08-07 DIAGNOSIS — E119 Type 2 diabetes mellitus without complications: Secondary | ICD-10-CM | POA: Diagnosis not present

## 2018-08-07 NOTE — Telephone Encounter (Signed)
New message   Pt c/o medication issue:  1. Name of Medication: Cymbalta   2. How are you currently taking this medication (dosage and times per day)? n/a  3. Are you having a reaction (difficulty breathing--STAT)? n/a  4. What is your medication issue? Patient states that Dr. Murray Hodgkins wants to know can be put on Cymbalta  with all other heart medications.

## 2018-08-07 NOTE — Telephone Encounter (Signed)
Left message to call back  

## 2018-08-07 NOTE — Telephone Encounter (Signed)
Will recommend against combination of Cymbalta and Flecainide due to potential increase on serum concentration.

## 2018-08-07 NOTE — Telephone Encounter (Signed)
Spoke with pt who states Dr. Murray HodgkinsBartko is wanting to know if pt is ok to take Cymbalta with her cardiac medication. Will route to Pharm D and MD.

## 2018-08-08 NOTE — Telephone Encounter (Signed)
Attempted to contact pt to inform of recommendation. Left detailed message as asked to do so by pt and advised to call back if she has any further questions.

## 2018-08-08 NOTE — Telephone Encounter (Signed)
Pt updated with Pharm D's recommendation. Pt states she only take Flecainide if she has an episode but hasn't needed it a year. Pt states her only concern is taking the medication with Eliquis. Will route to Pharm D.

## 2018-08-08 NOTE — Telephone Encounter (Signed)
ALL other medication on her list should be safe to take with Cymbalta

## 2018-08-11 DIAGNOSIS — M545 Low back pain: Secondary | ICD-10-CM | POA: Diagnosis not present

## 2018-08-11 DIAGNOSIS — M2560 Stiffness of unspecified joint, not elsewhere classified: Secondary | ICD-10-CM | POA: Diagnosis not present

## 2018-08-11 DIAGNOSIS — M48062 Spinal stenosis, lumbar region with neurogenic claudication: Secondary | ICD-10-CM | POA: Diagnosis not present

## 2018-08-15 ENCOUNTER — Other Ambulatory Visit: Payer: Self-pay | Admitting: Student

## 2018-08-18 ENCOUNTER — Emergency Department (HOSPITAL_COMMUNITY)
Admission: EM | Admit: 2018-08-18 | Discharge: 2018-08-19 | Disposition: A | Discharge status: Home / Self Care | Payer: 59 | Attending: Emergency Medicine | Admitting: Emergency Medicine

## 2018-08-18 ENCOUNTER — Encounter (HOSPITAL_COMMUNITY): Payer: Self-pay | Admitting: Emergency Medicine

## 2018-08-18 ENCOUNTER — Other Ambulatory Visit: Payer: Self-pay

## 2018-08-18 DIAGNOSIS — Z79899 Other long term (current) drug therapy: Secondary | ICD-10-CM | POA: Insufficient documentation

## 2018-08-18 DIAGNOSIS — Z794 Long term (current) use of insulin: Secondary | ICD-10-CM | POA: Insufficient documentation

## 2018-08-18 DIAGNOSIS — J019 Acute sinusitis, unspecified: Secondary | ICD-10-CM | POA: Insufficient documentation

## 2018-08-18 DIAGNOSIS — I10 Essential (primary) hypertension: Secondary | ICD-10-CM | POA: Diagnosis not present

## 2018-08-18 DIAGNOSIS — Z7901 Long term (current) use of anticoagulants: Secondary | ICD-10-CM | POA: Diagnosis not present

## 2018-08-18 DIAGNOSIS — E119 Type 2 diabetes mellitus without complications: Secondary | ICD-10-CM | POA: Insufficient documentation

## 2018-08-18 DIAGNOSIS — R05 Cough: Secondary | ICD-10-CM | POA: Diagnosis present

## 2018-08-18 DIAGNOSIS — F17210 Nicotine dependence, cigarettes, uncomplicated: Secondary | ICD-10-CM | POA: Diagnosis not present

## 2018-08-18 MED ORDER — AMOXICILLIN-POT CLAVULANATE 875-125 MG PO TABS
1.0000 | ORAL_TABLET | Freq: Once | ORAL | Status: AC
  Administered 2018-08-19: 1 via ORAL
  Filled 2018-08-18: qty 1

## 2018-08-18 MED ORDER — KETOROLAC TROMETHAMINE 60 MG/2ML IM SOLN
30.0000 mg | Freq: Once | INTRAMUSCULAR | Status: AC
  Administered 2018-08-19: 30 mg via INTRAMUSCULAR
  Filled 2018-08-18: qty 2

## 2018-08-18 MED ORDER — AMOXICILLIN-POT CLAVULANATE 875-125 MG PO TABS: 1.0000 | ORAL_TABLET | Freq: Two times a day (BID) | ORAL | 0 refills | Status: AC

## 2018-08-18 MED ORDER — FLUTICASONE PROPIONATE 50 MCG/ACT NA SUSP: 2.0000 | Freq: Every day | NASAL | 0 refills | Status: DC

## 2018-08-18 NOTE — Discharge Instructions (Signed)
We recommend that you take Augmentin as prescribed until finished.  Use Flonase for nasal congestion.  This can be supplemented with nasal saline sinus rinses such as use of a Neti Pot.  Use Tylenol for management of headache or pain.  Follow-up with your primary care doctor to ensure that symptoms resolve.

## 2018-08-18 NOTE — ED Triage Notes (Signed)
C/o runny nose and productive cough with yellow and brown phlegm x 4 days.  Denies body aches.  Reports bilateral eye pain and dental pain that started today.

## 2018-08-19 ENCOUNTER — Other Ambulatory Visit: Payer: Self-pay

## 2018-08-19 NOTE — ED Notes (Signed)
Patient verbalizes understanding of discharge instructions. Opportunity for questioning and answers were provided. Armband removed by staff, pt discharged from ED.  

## 2018-08-19 NOTE — ED Provider Notes (Signed)
[facility] EM[facility] Provider Note   CSN: [id number] Arrival date & time: 08/18/18  2143     History   Chief Complaint Chief Complaint  Patient presents with  . Eye Pain  . Nasal Congestion  . Cough    HPI Julia Dickerson is a 43 y.o. female.  43 year old female with a history of atrial fibrillation (on chronic Eliquis), diabetes, hypertension, esophageal reflux presents to the emergency department for evaluation of upper respiratory symptoms.  She notes onset of symptoms 4 days ago including nasal congestion with a cough productive of yellow and brown phlegm.  Over the past 24 hours, she has developed an associated front headache with bilateral otalgia, dental pain.  She describes her pain as throbbing.  She has tried over-the-counter remedies for symptoms without relief.  No associated fevers, vomiting, sick contacts, tinnitus, otalgia, photophobia, vision loss, pain with chewing or jaw opening, sore throat.   The history is provided by the patient. No language interpreter was used.  Eye Pain   Cough    Past Medical History:  Diagnosis Date  . Anxiety   . Arthritis   . Atrial fibrillation (HCC)    a. paroxysmal, uses PRN Flecainide  . Depression   . Diabetes mellitus without complication (HCC)   . Dysrhythmia   . GERD (gastroesophageal reflux disease)   . Hypertension   . Obesity   . PCOS (polycystic ovarian syndrome)   . UTI (lower urinary tract infection)     Patient Active Problem List   Diagnosis Date Noted  . PCOS (polycystic ovarian syndrome) 08/03/2018  . Chronic diarrhea 09/19/2017  . Neuropathic pain 09/19/2017  . Type 2 diabetes mellitus with diabetic neuropathy, unspecified (HCC) 08/19/2017  . Hyperlipidemia associated with type 2 diabetes mellitus (HCC) 08/19/2017  . Tobacco use disorder 08/19/2017  . Current use of long term anticoagulation 05/31/2017  . Atrial fibrillation (HCC) 05/16/2017  . Atrial fibrillation  with RVR (HCC)   . Abnormal EKG 06/24/2016  . Paroxysmal atrial fibrillation (HCC): CHA2DS2Vasc ~3 06/23/2016  . Essential hypertension 06/23/2016  . Major depression, recurrent (HCC) 07/10/2014    Past Surgical History:  Procedure Laterality Date  . DILATION AND CURETTAGE OF UTERUS    . GXT - Graded Exercise Tolerance Test  07/22/2016   Exercise time 6 minutes - 7 METs. Maximum heart rate 146 BPM. - No EKG changes. No arrhythmias or chest pain.: Low risk test.  . TRANSTHORACIC ECHOCARDIOGRAM  06/2016   EF 6570% with mild LVH. Normal wall motion and diastolic function. Mild RAE. Otherwise normal.  . WISDOM TOOTH EXTRACTION       OB History    Gravida  2   Para      Term      Preterm      AB  2   Living        SAB      TAB      Ectopic      Multiple      Live Births               Home Medications    Prior to Admission medications   Medication Sig Start Date End Date Taking? Authorizing Provider  amoxicillin-clavulanate (AUGMENTIN) 875-125 MG tablet Take 1 tablet by mouth every 12 (twelve) hours for 10 days. 08/18/18 08/28/18  Antony MaduraHumes, Patirica Longshore, PA-C  atorvastatin (LIPITOR) 40 MG tablet TAKE 1 TABLET BY MOUTH EVERY DAY 07/10/18   [place], Betty G, MD  diltiazem (  CARDIZEM CD) 120 MG 24 hr capsule Take 1 capsule (120 mg total) by mouth as needed. 01/19/18   Marykay Lex, MD  diltiazem (CARDIZEM CD) 180 MG 24 hr capsule Take 1 capsule (180 mg total) by mouth daily. NEED OV. 07/10/18   Strader, Lennart Pall, PA-C  ELIQUIS 5 MG TABS tablet TAKE 1 TABLET (5 MG TOTAL) 2 (TWO) TIMES DAILY BY MOUTH. 08/15/18   Strader, [place] M, PA-C  flecainide (TAMBOCOR) 100 MG tablet Take 2 tablets (200 mg total) by mouth as needed. For A-fib 08/18/16   Marykay Lex, MD  fluticasone [facility]) 50 MCG/ACT nasal spray Place 2 sprays into both nostrils daily. 08/18/18   Antony Madura, PA-C  gabapentin (NEURONTIN) 300 MG capsule TAKE ONE CAPSULE BY MOUTH 3 TIMES A DAY FOR 7 DAYS THEN 2 CAPSULES  THREE TIMES DAILY 12/06/17   [provider]  Insulin Glargine (BASAGLAR KWIKPEN) 100 UNIT/ML SOPN INJECT 0.2 MLS (20 UNITS TOTAL) INTO THE SKIN AT BEDTIME. 11/14/17   [place], Betty G, MD  Insulin Pen Needle 32G X 4 MM MISC Use daily with insulin pen. 08/22/17   [place], Betty G, MD  lisinopril (PRINIVIL,ZESTRIL) 10 MG tablet Take 1 tablet (10 mg total) by mouth daily. 08/29/17   [place], Betty G, MD  metFORMIN (GLUCOPHAGE-XR) 750 MG 24 hr tablet TAKE 1 TABLET (750 MG TOTAL) BY MOUTH 2 (TWO) TIMES DAILY WITH A MEAL. 06/26/18   Deeann Saint, MD  omeprazole (PRILOSEC) 20 MG capsule  02/09/10   [provider]    Family History Family History  Problem Relation Age of Onset  . Heart failure Father   . Heart attack Father   . Cancer Father   . Diabetes Father   . Hearing loss Father   . Hyperlipidemia Father   . Hypertension Father   . Arthritis Mother   . Diabetes Mother   . COPD Maternal Grandmother   . Heart disease Maternal Grandmother   . Hyperlipidemia Maternal Grandmother   . Hyperlipidemia Maternal Grandfather   . Hypertension Maternal Grandfather   . Hearing loss Maternal Grandfather   . Alcohol abuse Maternal Grandfather   . Arthritis Maternal Grandfather   . Stroke Maternal Grandfather   . Hyperlipidemia Paternal Grandmother   . Miscarriages / Stillbirths Paternal Grandmother   . Kidney disease Paternal Grandfather   . Hypertension Paternal Grandfather   . Heart attack Paternal Grandfather   . Diabetes Paternal Grandfather   . Arthritis Sister     Social History Social History   Tobacco Use  . Smoking status: Current Every Day Smoker    Packs/day: 0.50    Years: 21.00    Pack years: 10.50    Types: Cigarettes, E-cigarettes  . Smokeless tobacco: Never Used  . Tobacco comment: vapor  Substance Use Topics  . Alcohol use: Yes    Comment: rare  . Drug use: No     Allergies   Sulfa antibiotics   Review of Systems Review of Systems  Eyes:  Positive for pain.  Respiratory: Positive for cough.   Ten systems reviewed and are negative for acute change, except as noted in the HPI.    Physical Exam Updated Vital Signs BP (!) 179/123   Pulse (!) 102   Temp 97.8 F (36.6 C) (Oral)   Resp 16   SpO2 100%   Physical Exam Vitals signs and nursing note reviewed.  Constitutional:      General: She is not in acute distress.  Appearance: She is well-developed. She is not diaphoretic.     Comments: Nontoxic appearing and in NAD  HENT:     Head: Normocephalic and atraumatic.     Right Ear: Ear canal and external ear normal.     Left Ear: Tympanic membrane, ear canal and external ear normal.     Ears:     Comments: Right TM obscured by cerumen    Nose: Congestion present.     Mouth/Throat:     Mouth: Mucous membranes are moist.     Comments: Oropharynx clear. Normal phonation. Tolerating secretions. No trismus or malocclusion. Eyes:     General: No scleral icterus.    Extraocular Movements: Extraocular movements intact.     Conjunctiva/sclera: Conjunctivae normal.     Pupils: Pupils are equal, round, and reactive to light.     Comments: No nystagmus noted. No proptosis or periorbital edema or erythema.  Neck:     Musculoskeletal: Normal range of motion.     Comments: No meningismus Cardiovascular:     Rate and Rhythm: Normal rate and regular rhythm.     Pulses: Normal pulses.     Comments: Heart rate 96 by manual count, regular rhythm. Pulmonary:     Effort: Pulmonary effort is normal. No respiratory distress.     Comments: Respirations even and unlabored Musculoskeletal: Normal range of motion.  Skin:    General: Skin is warm and dry.     Coloration: Skin is not pale.     Findings: No erythema or rash.  Neurological:     General: No focal deficit present.     Mental Status: She is alert and oriented to person, place, and time.     Coordination: Coordination normal.  Psychiatric:        Behavior: Behavior normal.       ED Treatments / Results  Labs (all labs ordered are listed, but only abnormal results are displayed) Labs Reviewed - No data to display  EKG None  Radiology No results found.  Procedures Procedures (including critical care time)  Medications Ordered in ED Medications  ketorolac (TORADOL) injection 30 mg (30 mg Intramuscular Given 08/19/18 0010)  amoxicillin-clavulanate (AUGMENTIN) 875-125 MG per tablet 1 tablet (1 tablet Oral Given 08/19/18 0010)     Initial Impression / Assessment and Plan / ED Course  I have reviewed the triage vital signs and the nursing notes.  Pertinent labs & imaging results that were available during my care of the patient were reviewed by me and considered in my medical decision making (see chart for details).     Patient complaining of symptoms of sinusitis.  Severe symptoms include purulent nasal discharge and maxillary sinus pain with bilateral eye pain and dentalgia.  Concern for acute bacterial rhinosinusitis.  Patient discharged with Augmentin.  Instructions given for warm saline nasal wash and recommendations for follow-up with primary care physician.  Return precautions discussed and provided. Patient discharged in stable condition with no unaddressed concerns.   Final Clinical Impressions(s) / ED Diagnoses   Final diagnoses:  Acute non-recurrent sinusitis, unspecified location    ED Discharge Orders         Ordered    amoxicillin-clavulanate (AUGMENTIN) 875-125 MG tablet  Every 12 hours     08/18/18 2359    fluticasone (FLONASE) 50 MCG/ACT nasal spray  Daily     08/18/18 2359           Antony MaduraHumes, Inaaya Vellucci, PA-C 08/19/18 0112    Derwood KaplanNanavati, Ankit, MD  08/19/18 2335  

## 2018-08-22 DIAGNOSIS — M48062 Spinal stenosis, lumbar region with neurogenic claudication: Secondary | ICD-10-CM | POA: Diagnosis not present

## 2018-08-22 DIAGNOSIS — M545 Low back pain: Secondary | ICD-10-CM | POA: Diagnosis not present

## 2018-08-22 DIAGNOSIS — M2560 Stiffness of unspecified joint, not elsewhere classified: Secondary | ICD-10-CM | POA: Diagnosis not present

## 2018-08-23 ENCOUNTER — Ambulatory Visit: Payer: 59 | Admitting: Obstetrics & Gynecology

## 2018-08-23 ENCOUNTER — Encounter: Payer: Self-pay | Admitting: Obstetrics & Gynecology

## 2018-08-23 VITALS — BP 140/90 | Ht 67.0 in | Wt 249.0 lb

## 2018-08-23 DIAGNOSIS — N914 Secondary oligomenorrhea: Secondary | ICD-10-CM | POA: Diagnosis not present

## 2018-08-23 DIAGNOSIS — N83202 Unspecified ovarian cyst, left side: Secondary | ICD-10-CM

## 2018-08-23 NOTE — Progress Notes (Signed)
    Valta Aydt 24-Nov-1975 [id number]        43 y.o.  G2P0020 Married  RP: Irregular menstrual periods  HPI: Long standing h/o PCOs with Infertility.  No contraception.  Oligomenorrhea.  LMP 07/03/2018.  No pelvic pain.  Normal vaginal secretions.   OB History  Gravida Para Term Preterm AB Living  2       2    SAB TAB Ectopic Multiple Live Births               # Outcome Date GA Lbr Len/2nd Weight Sex Delivery Anes PTL Lv  2 AB           1 AB             Past medical history,surgical history, problem list, medications, allergies, family history and social history were all reviewed and documented in the EPIC chart.   Directed ROS with pertinent positives and negatives documented in the history of present illness/assessment and plan.  Exam:  Vitals:   08/23/18 1023  BP: 140/90  Weight: 249 lb (112.9 kg)  Height: 5\' 7"  (1.702 m)   General appearance:  Normal  Abdomen: Normal  Gynecologic exam: Vulva normal.    Pelvic US 01/05/2018:  Uterus Measurements: 8.6 x 2.9 x 3.5 cm. No fibroids or other mass Visualized.  Endometrium Thickness: 6.2 mm.  No focal abnormality visualized. Right ovary Measurements: 3.7 x 2.0 x 1.8 cm. Normal appearance/no adnexal mass. Left ovary Measurements: 4.8 x 3.7 x 2.5 cm. 2.7 x 2.6 x 2.6 cm cyst with a few low-level internal echoes. No internal vascularity. No abnormal free fluid.    Assessment/Plan:  43 y.o. G2P0020   1. Secondary oligomenorrhea H/O PCOs/Infertility.  No contraception currently.  Would not want to be on Progesterone IUD.  May start her on the Progestin-only BCPs per results. - FSH - Prolactin - TSH - Hemoglobin A1C - hCG, serum, qualitative - US Transvaginal Non-OB; Future  2. Left ovarian cyst Pelvic ultrasound in June 2019 revealed a left ovarian cyst measuring 2.7 cm with low-level echoes, no increased vascularity.  We will repeat a pelvic ultrasound to follow-up on the left ovarian cyst and assess the  endometrium. - US Transvaginal Non-OB; Future  Other orders - DULoxetine (CYMBALTA) 30 MG capsule; Take 30 mg by mouth daily.  Counseling on above issues and coordination of care more than 50% for 30 minutes.  Genia Del MD, 10:37 AM 08/23/2018

## 2018-08-24 ENCOUNTER — Ambulatory Visit: Payer: Self-pay | Admitting: Internal Medicine

## 2018-08-24 DIAGNOSIS — M48062 Spinal stenosis, lumbar region with neurogenic claudication: Secondary | ICD-10-CM | POA: Diagnosis not present

## 2018-08-24 DIAGNOSIS — M545 Low back pain: Secondary | ICD-10-CM | POA: Diagnosis not present

## 2018-08-24 DIAGNOSIS — M2560 Stiffness of unspecified joint, not elsewhere classified: Secondary | ICD-10-CM | POA: Diagnosis not present

## 2018-08-24 LAB — PROLACTIN: Prolactin: 1.6 ng/mL — ABNORMAL LOW

## 2018-08-24 LAB — HEMOGLOBIN A1C
Hgb A1c MFr Bld: 10.7 % of total Hgb — ABNORMAL HIGH (ref <5.7)
Mean Plasma Glucose: 260 (calc)
eAG (mmol/L): 14.4 (calc)

## 2018-08-24 LAB — HCG, SERUM, QUALITATIVE: Preg, Serum: NEGATIVE

## 2018-08-24 LAB — FOLLICLE STIMULATING HORMONE: FSH: 38.1 m[IU]/mL

## 2018-08-24 LAB — TSH: TSH: 0.99 mIU/L

## 2018-08-28 ENCOUNTER — Encounter: Payer: Self-pay | Admitting: Pulmonary Disease

## 2018-08-28 ENCOUNTER — Ambulatory Visit (INDEPENDENT_AMBULATORY_CARE_PROVIDER_SITE_OTHER): Payer: 59 | Admitting: Pulmonary Disease

## 2018-08-28 ENCOUNTER — Encounter: Payer: Self-pay | Admitting: Obstetrics & Gynecology

## 2018-08-28 VITALS — BP 142/44 | HR 85 | Ht 68.0 in | Wt 248.2 lb

## 2018-08-28 DIAGNOSIS — R0683 Snoring: Secondary | ICD-10-CM

## 2018-08-28 NOTE — Progress Notes (Signed)
   Subjective:    Patient ID: Julia Dickerson, female    DOB: Dec 03, 1975, 43 y.o.   MRN: [id number]  HPI    Review of Systems  Constitutional: Negative for fever and unexpected weight change.  HENT: Positive for postnasal drip, sinus pressure and sneezing. Negative for congestion, dental problem, ear pain, nosebleeds, rhinorrhea, sore throat and trouble swallowing.   Eyes: Negative for redness and itching.  Respiratory: Negative for cough, chest tightness, shortness of breath and wheezing.   Cardiovascular: Negative for palpitations and leg swelling.  Gastrointestinal: Negative for nausea and vomiting.  Genitourinary: Negative for dysuria.  Musculoskeletal: Negative for joint swelling.  Skin: Negative for rash.  Allergic/Immunologic: Positive for environmental allergies. Negative for food allergies and immunocompromised state.  Neurological: Positive for headaches.  Hematological: Does not bruise/bleed easily.  Psychiatric/Behavioral: Negative for dysphoric mood. The patient is not nervous/anxious.        Objective:   Physical Exam        Assessment & Plan:

## 2018-08-28 NOTE — Patient Instructions (Signed)
Will arrange for home sleep study Will call to arrange for follow up after sleep study reviewed  

## 2018-08-28 NOTE — Patient Instructions (Signed)
1. Secondary oligomenorrhea H/O PCOs/Infertility.  No contraception currently.  Would not want to be on Progesterone IUD.  May start her on the Progestin-only BCPs per results. - FSH - Prolactin - TSH - Hemoglobin A1C - hCG, serum, qualitative - US Transvaginal Non-OB; Future  2. Left ovarian cyst Pelvic ultrasound in June 2019 revealed a left ovarian cyst measuring 2.7 cm with low-level echoes, no increased vascularity.  We will repeat a pelvic ultrasound to follow-up on the left ovarian cyst and assess the endometrium. - US Transvaginal Non-OB; Future  Other orders - DULoxetine (CYMBALTA) 30 MG capsule; Take 30 mg by mouth daily.  Julia Dickerson, it was a pleasure meeting you today!  I will inform you of your results as soon as they are available.

## 2018-08-28 NOTE — Progress Notes (Signed)
[facility]  Chief Complaint  Patient presents with  . sleep consult    Pt referred by Dr. Abbe Amsterdam MD. Pt cannot sleep longer than 4 hrs each night, wakes up tired.    Constitutional:  BP (!) 142/44 (BP Location: Left Arm, Cuff Size: Normal)   Pulse 85   Ht 5\' 8"  (1.727 m)   Wt 248 lb 3.2 oz (112.6 kg)   SpO2 98%   BMI 37.74 kg/m   Past Medical History:  Polycystic ovary syndrome, HTN, GERD, A fib, DM, Depression, OA, Anxiety  Brief Summary:  Julia Dickerson is a 43 y.o. female with snoring.  Her husband has been worried about her snoring.  He has sleep apnea, and thinks she has it also.  This has been going on for few years and getting worse.   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  She is a restless sleeper.  She has trouble sleeping on her back.  She doesn't dream much.  She goes to sleep at midnight  She falls asleep in about 30 minutes.  She wakes up 1 or 2 times to use the bathroom.  She gets out of bed at 10 am.  She feels tired in the morning.  She denies morning headache.  She does not use anything to help her fall sleep.  She drinks coffee in the morning.  She denies sleep walking, sleep talking, bruxism, or nightmares.  There is no history of restless legs.  She denies sleep hallucinations, sleep paralysis, or cataplexy.  The Epworth score is 8 out of 24.     Physical Exam:   Appearance - well kempt   ENMT - clear nasal mucosa, midline nasal  septum, no oral exudates, no LAN, trachea midline, MP 2, low laying soft palate  Respiratory - normal chest wall, normal respiratory effort, no accessory muscle use, no wheeze/rales  CV - s1s2 regular rate and rhythm, no murmurs, no peripheral edema, radial pulses symmetric  GI - soft, non tender, no masses  Lymph - no adenopathy noted in neck and axillary areas  MSK - normal gait  Ext - no cyanosis, clubbing, or  joint inflammation noted  Skin - no rashes, lesions, or ulcers  Neuro - normal strength, oriented x 3  Psych - normal mood and affect  CBC Latest Ref Rng & Units 07/31/2018 06/13/2018 12/26/2017  WBC 4.0 - 10.5 K/uL 10.3 - 10.2  Hemoglobin 12.0 - 15.0 g/dL 16.1(H) 16.0(H) 14.4  Hematocrit 36.0 - 46.0 % 47.0(H) 47.0(H) 41.9  Platelets 150.0 - 400.0 K/uL 294.0 - 324.0    CMP Latest Ref Rng & Units 07/31/2018 06/13/2018 12/26/2017  Glucose 70 - 99 mg/dL 563(J) 497(W) 263(Z)  BUN 6 - 23 mg/dL 13 12 11   Creatinine 0.40 - 1.20 mg/dL 8.58 8.50 2.77  Sodium 135 - 145 mEq/L 136 134(L) 137  Potassium 3.5 - 5.1 mEq/L 4.3 4.1 5.0  Chloride 96 - 112 mEq/L 98 100 99  CO2 19 - 32 mEq/L 27 - 29  Calcium 8.4 - 10.5 mg/dL 9.8 - 9.6  Total Protein 6.5 - 8.1 g/dL - - -  Total Bilirubin 0.3 - 1.2 mg/dL - - -  Alkaline Phos 38 - 126 U/L - - -  AST 15 - 41 U/L - - -  ALT 14 - 54 U/L - - -     Discussion:  She has snoring, sleep  disruption, daytime sleepiness, and witnessed apnea.  She has elevated hemoglobin.  She has history of HTN, A fib, DM, and depression.  Her father has sleep apnea.  I am concerned she could have sleep apnea also.  Assessment/Plan:   Snoring with excessive daytime sleepiness. - will need to arrange for a home sleep study  Obesity. - discussed how weight can impact sleep and risk for sleep disordered breathing - discussed options to assist with weight loss: combination of diet modification, cardiovascular and strength training exercises  Cardiovascular risk. - had an extensive discussion regarding the adverse health consequences related to untreated sleep disordered breathing - specifically discussed the risks for hypertension, coronary artery disease, cardiac dysrhythmias, cerebrovascular disease, and diabetes - lifestyle modification discussed  Safe driving practices. - discussed how sleep disruption can increase risk of accidents, particularly when driving - safe driving  practices were discussed  Therapies for obstructive sleep apnea. - if the sleep study shows significant sleep apnea, then various therapies for treatment were reviewed: CPAP, oral appliance, and surgical interventions      Patient Instructions  Will arrange for home sleep study Will call to arrange for follow up after sleep study reviewed     Coralyn HellingVineet Tyesha Joffe, MD [facility] Pager: [phone] 08/28/2018, 10:00 AM  Flow Sheet    Sleep tests:    Cardiac tests:  Echo 07/15/16 >> mild LVH, EF 65 to 70%   Review of Systems:  Constitutional: Negative for fever and unexpected weight change.  HENT: Positive for postnasal drip, sinus pressure and sneezing. Negative for congestion, dental problem, ear pain, nosebleeds, rhinorrhea, sore throat and trouble swallowing.   Eyes: Negative for redness and itching.  Respiratory: Negative for cough, chest tightness, shortness of breath and wheezing.   Cardiovascular: Negative for palpitations and leg swelling.  Gastrointestinal: Negative for nausea and vomiting.  Genitourinary: Negative for dysuria.  Musculoskeletal: Negative for joint swelling.  Skin: Negative for rash.  Allergic/Immunologic: Positive for environmental allergies. Negative for food allergies and immunocompromised state.  Neurological: Positive for headaches.  Hematological: Does not bruise/bleed easily.  Psychiatric/Behavioral: Negative for dysphoric mood. The patient is not nervous/anxious.    Medications:   Allergies as of 08/28/2018      Reactions   Sulfa Antibiotics Hives      Medication List       Accurate as of August 28, 2018 10:00 AM. Always use your most recent med list.        amoxicillin-clavulanate 875-125 MG tablet Commonly known as:  AUGMENTIN Take 1 tablet by mouth every 12 (twelve) hours for 10 days.   atorvastatin 40 MG tablet Commonly known as:  LIPITOR TAKE 1 TABLET BY MOUTH EVERY DAY   BASAGLAR KWIKPEN 100 UNIT/ML  Sopn INJECT 0.2 MLS (20 UNITS TOTAL) INTO THE SKIN AT BEDTIME.   diltiazem 120 MG 24 hr capsule Commonly known as:  CARDIZEM CD Take 1 capsule (120 mg total) by mouth as needed.   diltiazem 180 MG 24 hr capsule Commonly known as:  CARDIZEM CD Take 1 capsule (180 mg total) by mouth daily. NEED OV.   DULoxetine 30 MG capsule Commonly known as:  CYMBALTA Take 30 mg by mouth daily.   ELIQUIS 5 MG Tabs tablet Generic drug:  apixaban TAKE 1 TABLET (5 MG TOTAL) 2 (TWO) TIMES DAILY BY MOUTH.   flecainide 100 MG tablet Commonly known as:  TAMBOCOR Take 2 tablets (200 mg total) by mouth as needed. For A-fib   fluticasone 50 MCG/ACT nasal  spray Commonly known as:  FLONASE Place 2 sprays into both nostrils daily.   Insulin Pen Needle 32G X 4 MM Misc Use daily with insulin pen.   lisinopril 10 MG tablet Commonly known as:  PRINIVIL,ZESTRIL Take 1 tablet (10 mg total) by mouth daily.   metFORMIN 750 MG 24 hr tablet Commonly known as:  GLUCOPHAGE-XR TAKE 1 TABLET (750 MG TOTAL) BY MOUTH 2 (TWO) TIMES DAILY WITH A MEAL.   omeprazole 20 MG capsule Commonly known as:  PRILOSEC       Past Surgical History:  She  has a past surgical history that includes Wisdom tooth extraction; transthoracic echocardiogram (06/2016); GXT - Graded Exercise Tolerance Test (07/22/2016); and Dilation and curettage of uterus (2011 and 2012).  Family History:  Her family history includes Alcohol abuse in her maternal grandfather; Arthritis in her maternal grandfather, mother, and sister; COPD in her maternal grandmother; Cancer in her father; Diabetes in her father, mother, and paternal grandfather; Hearing loss in her father and maternal grandfather; Heart attack in her father and paternal grandfather; Heart disease in her maternal grandmother; Heart failure in her father; Hyperlipidemia in her father, maternal grandfather, maternal grandmother, and paternal grandmother; Hypertension in her father, maternal  grandfather, and paternal grandfather; Kidney disease in her paternal grandfather; Miscarriages / [place] in her paternal grandmother; Stroke in her maternal grandfather.  Social History:  She  reports that she quit smoking 4 days ago. Her smoking use included cigarettes and e-cigarettes. She has a 10.50 pack-year smoking history. She has never used smokeless tobacco. She reports current alcohol use. She reports that she does not use drugs.

## 2018-08-29 DIAGNOSIS — M48062 Spinal stenosis, lumbar region with neurogenic claudication: Secondary | ICD-10-CM | POA: Diagnosis not present

## 2018-08-29 DIAGNOSIS — M2560 Stiffness of unspecified joint, not elsewhere classified: Secondary | ICD-10-CM | POA: Diagnosis not present

## 2018-08-29 DIAGNOSIS — M545 Low back pain: Secondary | ICD-10-CM | POA: Diagnosis not present

## 2018-08-30 ENCOUNTER — Telehealth: Payer: Self-pay

## 2018-08-30 ENCOUNTER — Telehealth: Payer: Self-pay | Admitting: Internal Medicine

## 2018-08-30 NOTE — Telephone Encounter (Signed)
[facility] called and stated that the patients prolactin levels are low and see you are going to be treating her next week. Would you be able to treat her for this also?  Please Advise, thanks

## 2018-08-31 ENCOUNTER — Encounter: Payer: Self-pay | Admitting: Family Medicine

## 2018-08-31 ENCOUNTER — Ambulatory Visit: Payer: 59 | Admitting: Family Medicine

## 2018-08-31 VITALS — BP 138/85 | HR 104 | Temp 98.6°F | Wt 248.2 lb

## 2018-08-31 DIAGNOSIS — J029 Acute pharyngitis, unspecified: Secondary | ICD-10-CM | POA: Diagnosis not present

## 2018-08-31 DIAGNOSIS — H6121 Impacted cerumen, right ear: Secondary | ICD-10-CM

## 2018-08-31 LAB — POCT RAPID STREP A (OFFICE): Rapid Strep A Screen: NEGATIVE

## 2018-08-31 NOTE — Progress Notes (Signed)
Subjective:    Patient ID: Julia Dickerson, female    DOB: Jan 26, 1976, 43 y.o.   MRN: [id number]  No chief complaint on file.   HPI Patient was seen today for acute concern.  Pt endorses sore throat and headache times the last few days.  Patient notes it feels like something is "stuck in her throat" when she swallows.  Pt notes when swallows there is a "sharp" feeling in her throat.  Pt notes she was recently seen in the ED 1/24 and dx'd with sinusitis for which she completed Augmentin.  Past Medical History:  Diagnosis Date  . Anxiety   . Arthritis   . Atrial fibrillation (HCC)    a. paroxysmal, uses PRN Flecainide  . Depression   . Diabetes mellitus without complication (HCC)   . Dysrhythmia   . GERD (gastroesophageal reflux disease)   . Hypertension   . Obesity   . PCOS (polycystic ovarian syndrome)   . UTI (lower urinary tract infection)     Allergies  Allergen Reactions  . Sulfa Antibiotics Hives    ROS General: Denies fever, chills, night sweats, changes in weight, changes in appetite HEENT: Denies ear pain, changes in vision, rhinorrhea  +sore throat, HA CV: Denies CP, palpitations, SOB, orthopnea Pulm: Denies SOB, cough, wheezing GI: Denies abdominal pain, nausea, vomiting, diarrhea, constipation GU: Denies dysuria, hematuria, frequency, vaginal discharge Msk: Denies muscle cramps, joint pains Neuro: Denies weakness, numbness, tingling Skin: Denies rashes, bruising Psych: Denies depression, anxiety, hallucinations    Objective:    Blood pressure 138/85, pulse (!) 104, temperature 98.6 F (37 C), temperature source Oral, weight 248 lb 3.2 oz (112.6 kg), SpO2 98 %.  Gen. Pleasant, well-nourished, in no distress, normal affect  HEENT: [place]/AT, face symmetric, no scleral icterus, PERRLA, nares patent without drainage, pharynx with erythema, no exudate. R canal with cerumen.  R TM normal after irrigation.  L TM full, no cervical lymphadenopathy Lungs: no accessory  muscle use, CTAB, no wheezes or rales Cardiovascular: RRR, no m/r/g, no peripheral edema Neuro:  A&Ox3, CN II-XII intact, normal gait  Wt Readings from Last 3 Encounters:  08/31/18 221 lb (100.2 kg)  08/28/18 248 lb 3.2 oz (112.6 kg)  08/23/18 249 lb (112.9 kg)    Lab Results  Component Value Date   WBC 10.3 07/31/2018   HGB 16.1 (H) 07/31/2018   HCT 47.0 (H) 07/31/2018   PLT 294.0 07/31/2018   GLUCOSE 384 (H) 07/31/2018   CHOL 204 (H) 08/22/2017   TRIG 302.0 (H) 08/22/2017   HDL 27.60 (L) 08/22/2017   LDLDIRECT 140.0 08/22/2017   ALT 40 11/20/2015   AST 42 (H) 11/20/2015   NA 136 07/31/2018   K 4.3 07/31/2018   CL 98 07/31/2018   CREATININE 0.79 07/31/2018   BUN 13 07/31/2018   CO2 27 07/31/2018   TSH 0.99 08/23/2018   INR 1.00 06/13/2018   HGBA1C 10.7 (H) 08/23/2018   MICROALBUR 7.4 (H) 12/26/2017    Assessment/Plan:  Sore throat  -likely viral -supportive care.  Encouraged to gargle with warm salt water or Chloraseptic spray. -Patient encouraged to stay hydrated if does not feel like eating -Given handout - Plan: POCT rapid strep A  negative  Impacted cerumen of right ear -Consent obtained.  Ear irrigated.  Patient tolerated procedure well. -Given handout  Follow-up PRN  Abbe Amsterdam, MD

## 2018-08-31 NOTE — Patient Instructions (Signed)
Sore Throat A sore throat is pain, burning, irritation, or scratchiness in the throat. When you have a sore throat, you may feel pain or tenderness in your throat when you swallow or talk. Many things can cause a sore throat, including:  An infection.  Seasonal allergies.  Dryness in the air.  Irritants, such as smoke or pollution.  Radiation treatment to the area.  Gastroesophageal reflux disease (GERD).  A tumor. A sore throat is often the first sign of another sickness. It may happen with other symptoms, such as coughing, sneezing, fever, and swollen neck glands. Most sore throats go away without medical treatment. Follow these instructions at home:      Take over-the-counter medicines only as told by your health care provider. ? If your child has a sore throat, do not give your child aspirin because of the association with Reye syndrome.  Drink enough fluids to keep your urine pale yellow.  Rest as needed.  To help with pain, try: ? Sipping warm liquids, such as broth, herbal tea, or warm water. ? Eating or drinking cold or frozen liquids, such as frozen ice pops. ? Gargling with a salt-water mixture 3-4 times a day or as needed. To make a salt-water mixture, completely dissolve -1 tsp (3-6 g) of salt in 1 cup (237 mL) of warm water. ? Sucking on hard candy or throat lozenges. ? Putting a cool-mist humidifier in your bedroom at night to moisten the air. ? Sitting in the bathroom with the door closed for 5-10 minutes while you run hot water in the shower.  Do not use any products that contain nicotine or tobacco, such as cigarettes, e-cigarettes, and chewing tobacco. If you need help quitting, ask your health care provider.  Wash your hands well and often with soap and water. If soap and water are not available, use hand sanitizer. Contact a health care provider if:  You have a fever for more than 2-3 days.  You have symptoms that last (are persistent) for more than  2-3 days.  Your throat does not get better within 7 days.  You have a fever and your symptoms suddenly get worse.  Your child who is 3 months to 42 years old has a temperature of 102.25F (39C) or higher. Get help right away if:  You have difficulty breathing.  You cannot swallow fluids, soft foods, or your saliva.  You have increased swelling in your throat or neck.  You have persistent nausea and vomiting. Summary  A sore throat is pain, burning, irritation, or scratchiness in the throat. Many things can cause a sore throat.  Take over-the-counter medicines only as told by your health care provider. Do not give your child aspirin.  Drink plenty of fluids, and rest as needed.  Contact a health care provider if your symptoms worsen or your sore throat does not get better within 7 days. This information is not intended to replace advice given to you by your health care provider. Make sure you discuss any questions you have with your health care provider. Document Released: 08/19/2004 Document Revised: 12/12/2017 Document Reviewed: 12/12/2017 Elsevier Interactive Patient Education  2019 [employer]  Earwax Buildup, Adult The ears produce a substance called earwax that helps keep bacteria out of the ear and protects the skin in the ear canal. Occasionally, earwax can build up in the ear and cause discomfort or hearing loss. What increases the risk? This condition is more likely to develop in people who:  Are female.  Are elderly.  Naturally produce more earwax.  Clean their ears often with cotton swabs.  Use earplugs often.  Use in-ear headphones often.  Wear hearing aids.  Have narrow ear canals.  Have earwax that is overly thick or sticky.  Have eczema.  Are dehydrated.  Have excess hair in the ear canal. What are the signs or symptoms? Symptoms of this condition include:  Reduced or muffled hearing.  A feeling of fullness in the ear or feeling that the  ear is plugged.  Fluid coming from the ear.  Ear pain.  Ear itch.  Ringing in the ear.  Coughing.  An obvious piece of earwax that can be seen inside the ear canal. How is this diagnosed? This condition may be diagnosed based on:  Your symptoms.  Your medical history.  An ear exam. During the exam, your health care provider will look into your ear with an instrument called an otoscope. You may have tests, including a hearing test. How is this treated? This condition may be treated by:  Using ear drops to soften the earwax.  Having the earwax removed by a health care provider. The health care provider may: ? Flush the ear with water. ? Use an instrument that has a loop on the end (curette). ? Use a suction device.  Surgery to remove the wax buildup. This may be done in severe cases. Follow these instructions at home:   Take over-the-counter and prescription medicines only as told by your health care provider.  Do not put any objects, including cotton swabs, into your ear. You can clean the opening of your ear canal with a washcloth or facial tissue.  Follow instructions from your health care provider about cleaning your ears. Do not over-clean your ears.  Drink enough fluid to keep your urine clear or pale yellow. This will help to thin the earwax.  Keep all follow-up visits as told by your health care provider. If earwax builds up in your ears often or if you use hearing aids, consider seeing your health care provider for routine, preventive ear cleanings. Ask your health care provider how often you should schedule your cleanings.  If you have hearing aids, clean them according to instructions from the manufacturer and your health care provider. Contact a health care provider if:  You have ear pain.  You develop a fever.  You have blood, pus, or other fluid coming from your ear.  You have hearing loss.  You have ringing in your ears that does not go  away.  Your symptoms do not improve with treatment.  You feel like the room is spinning (vertigo). Summary  Earwax can build up in the ear and cause discomfort or hearing loss.  The most common symptoms of this condition include reduced or muffled hearing and a feeling of fullness in the ear or feeling that the ear is plugged.  This condition may be diagnosed based on your symptoms, your medical history, and an ear exam.  This condition may be treated by using ear drops to soften the earwax or by having the earwax removed by a health care provider.  Do not put any objects, including cotton swabs, into your ear. You can clean the opening of your ear canal with a washcloth or facial tissue. This information is not intended to replace advice given to you by your health care provider. Make sure you discuss any questions you have with your health care provider. Document Released: 08/19/2004 Document Revised: 06/23/2017  Document Reviewed: 09/22/2016 Elsevier Interactive Patient Education  [employer].

## 2018-09-04 ENCOUNTER — Ambulatory Visit: Payer: 59 | Admitting: Family Medicine

## 2018-09-04 ENCOUNTER — Encounter: Payer: Self-pay | Admitting: Family Medicine

## 2018-09-04 VITALS — BP 138/86 | HR 98 | Temp 98.1°F | Wt 248.0 lb

## 2018-09-04 DIAGNOSIS — M79644 Pain in right finger(s): Secondary | ICD-10-CM

## 2018-09-04 DIAGNOSIS — R0683 Snoring: Secondary | ICD-10-CM

## 2018-09-04 DIAGNOSIS — M792 Neuralgia and neuritis, unspecified: Secondary | ICD-10-CM

## 2018-09-04 DIAGNOSIS — E282 Polycystic ovarian syndrome: Secondary | ICD-10-CM

## 2018-09-04 DIAGNOSIS — L309 Dermatitis, unspecified: Secondary | ICD-10-CM

## 2018-09-04 MED ORDER — PREDNISONE 10 MG PO TABS: ORAL_TABLET | ORAL | 0 refills | Status: DC

## 2018-09-04 MED ORDER — HYDROCORTISONE 1 % EX CREA: 1.0000 "application " | TOPICAL_CREAM | Freq: Two times a day (BID) | CUTANEOUS | 0 refills | Status: DC

## 2018-09-04 NOTE — Patient Instructions (Signed)
Atopic Dermatitis Atopic dermatitis is a skin disorder that causes inflammation of the skin. This is the most common type of eczema. Eczema is a group of skin conditions that cause the skin to be itchy, red, and swollen. This condition is generally worse during the cooler winter months and often improves during the warm summer months. Symptoms can vary from person to person. Atopic dermatitis usually starts showing signs in infancy and can last through adulthood. This condition cannot be passed from one person to another (non-contagious), but it is more common in families. Atopic dermatitis may not always be present. When it is present, it is called a flare-up. What are the causes? The exact cause of this condition is not known. Flare-ups of the condition may be triggered by:  Contact with something that you are sensitive or allergic to.  Stress.  Certain foods.  Extremely hot or cold weather.  Harsh chemicals and soaps.  Dry air.  Chlorine. What increases the risk? This condition is more likely to develop in people who have a personal history or family history of eczema, allergies, asthma, or hay fever. What are the signs or symptoms? Symptoms of this condition include:  Dry, scaly skin.  Red, itchy rash.  Itchiness, which can be severe. This may occur before the skin rash. This can make sleeping difficult.  Skin thickening and cracking that can occur over time. How is this diagnosed? This condition is diagnosed based on your symptoms, a medical history, and a physical exam. How is this treated? There is no cure for this condition, but symptoms can usually be controlled. Treatment focuses on:  Controlling the itchiness and scratching. You may be given medicines, such as antihistamines or steroid creams.  Limiting exposure to things that you are sensitive or allergic to (allergens).  Recognizing situations that cause stress and developing a plan to manage stress. If your  atopic dermatitis does not get better with medicines, or if it is all over your body (widespread), a treatment using a specific type of light (phototherapy) may be used. Follow these instructions at home: Skin care   Keep your skin well-moisturized. Doing this seals in moisture and helps to prevent dryness. ? Use unscented lotions that have petroleum in them. ? Avoid lotions that contain alcohol or water. They can dry the skin.  Keep baths or showers short (less than 5 minutes) in warm water. Do not use hot water. ? Use mild, unscented cleansers for bathing. Avoid soap and bubble bath. ? Apply a moisturizer to your skin right after a bath or shower.  Do not apply anything to your skin without checking with your health care provider. General instructions  Dress in clothes made of cotton or cotton blends. Dress lightly because heat increases itchiness.  When washing your clothes, rinse your clothes twice so all of the soap is removed.  Avoid any triggers that can cause a flare-up.  Try to manage your stress.  Keep your fingernails cut short.  Avoid scratching. Scratching makes the rash and itchiness worse. It may also result in a skin infection (impetigo) due to a break in the skin caused by scratching.  Take or apply over-the-counter and prescription medicines only as told by your health care provider.  Keep all follow-up visits as told by your health care provider. This is important.  Do not be around people who have cold sores or fever blisters. If you get the infection, it may cause your atopic dermatitis to worsen. Contact a health   care provider if:  Your itchiness interferes with sleep.  Your rash gets worse or it is not better within one week of starting treatment.  You have a fever.  You have a rash flare-up after having contact with someone who has cold sores or fever blisters. Get help right away if:  You develop pus or soft yellow scabs in the rash  area. Summary  This condition causes a red rash and itchy, dry, scaly skin.  Treatment focuses on controlling the itchiness and scratching, limiting exposure to things that you are sensitive or allergic to (allergens), recognizing situations that cause stress, and developing a plan to manage stress.  Keep your skin well-moisturized.  Keep baths or showers shorter than 5 minutes and use warm water. Do not use hot water. This information is not intended to replace advice given to you by your health care provider. Make sure you discuss any questions you have with your health care provider. Document Released: 07/09/2000 Document Revised: 08/13/2016 Document Reviewed: 08/13/2016 Elsevier Interactive Patient Education  2019 [employer].  Perimenopause  Perimenopause is the normal time of life before and after menstrual periods stop completely (menopause). Perimenopause can begin 2-8 years before menopause, and it usually lasts for 1 year after menopause. During perimenopause, the ovaries may or may not produce an egg. What are the causes? This condition is caused by a natural change in hormone levels that happens as you get older. What increases the risk? This condition is more likely to start at an earlier age if you have certain medical conditions or treatments, including:  A tumor of the pituitary gland in the brain.  A disease that affects the ovaries and hormone production.  Radiation treatment for cancer.  Certain cancer treatments, such as chemotherapy or hormone (anti-estrogen) therapy.  Heavy smoking and excessive alcohol use.  Family history of early menopause. What are the signs or symptoms? Perimenopausal changes affect each woman differently. Symptoms of this condition may include:  Hot flashes.  Night sweats.  Irregular menstrual periods.  Decreased sex drive.  Vaginal dryness.  Headaches.  Mood swings.  Depression.  Memory problems or trouble  concentrating.  Irritability.  Tiredness.  Weight gain.  Anxiety.  Trouble getting pregnant. How is this diagnosed? This condition is diagnosed based on your medical history, a physical exam, your age, your menstrual history, and your symptoms. Hormone tests may also be done. How is this treated? In some cases, no treatment is needed. You and your health care provider should make a decision together about whether treatment is necessary. Treatment will be based on your individual condition and preferences. Various treatments are available, such as:  Menopausal hormone therapy (MHT).  Medicines to treat specific symptoms.  Acupuncture.  Vitamin or herbal supplements. Before starting treatment, make sure to let your health care provider know if you have a personal or family history of:  Heart disease.  Breast cancer.  Blood clots.  Diabetes.  Osteoporosis. Follow these instructions at home: Lifestyle  Do not use any products that contain nicotine or tobacco, such as cigarettes and e-cigarettes. If you need help quitting, ask your health care provider.  Eat a balanced diet that includes fresh fruits and vegetables, whole grains, soybeans, eggs, lean meat, and low-fat dairy.  Get at least 30 minutes of physical activity on 5 or more days each week.  Avoid alcoholic and caffeinated beverages, as well as spicy foods. This may help prevent hot flashes.  Get 7-8 hours of sleep each night.  Dress in layers that can be removed to help you manage hot flashes.  Find ways to manage stress, such as deep breathing, meditation, or journaling. General instructions  Keep track of your menstrual periods, including: ? When they occur. ? How heavy they are and how long they last. ? How much time passes between periods.  Keep track of your symptoms, noting when they start, how often you have them, and how long they last.  Take over-the-counter and prescription medicines only as  told by your health care provider.  Take vitamin supplements only as told by your health care provider. These may include calcium, vitamin E, and vitamin D.  Use vaginal lubricants or moisturizers to help with vaginal dryness and improve comfort during sex.  Talk with your health care provider before starting any herbal supplements.  Keep all follow-up visits as told by your health care provider. This is important. This includes any group therapy or counseling. Contact a health care provider if:  You have heavy vaginal bleeding or pass blood clots.  Your period lasts more than 2 days longer than normal.  Your periods are recurring sooner than 21 days.  You bleed after having sex. Get help right away if:  You have chest pain, trouble breathing, or trouble talking.  You have severe depression.  You have pain when you urinate.  You have severe headaches.  You have vision problems. Summary  Perimenopause is the time when a woman's body begins to move into menopause. This may happen naturally or as a result of other health problems or medical treatments.  Perimenopause can begin 2-8 years before menopause, and it usually lasts for 1 year after menopause.  Perimenopausal symptoms can be managed through medicines, lifestyle changes, and complementary therapies such as acupuncture. This information is not intended to replace advice given to you by your health care provider. Make sure you discuss any questions you have with your health care provider. Document Released: 08/19/2004 Document Revised: 08/17/2016 Document Reviewed: 08/17/2016 Elsevier Interactive Patient Education  2019 [employer].

## 2018-09-04 NOTE — Progress Notes (Signed)
Subjective:    Patient ID: Julia Dickerson, female    DOB: 1976-03-11, 43 y.o.   MRN: [id number]  No chief complaint on file.   HPI Patient was seen today for follow-up on several issues.  Pt states she wanted to update this provider.  PCOS: -Pt had follow-up with OB/GYN -States based on lab work she may be in menopause. -Pt has ultrasound appointment next week for further evaluation -Pt mentions her prolactin was "too low" -Taking metformin 750 mg every morning -Cannot recall last LMP as they have been irregular for some time. -We will call some spotting in October/November 2019  Right thumb pain: -endorses pain x1 month -Feels like thumb is swollen at times -Denies injury -does a lot of typing at work. -Pain with full extension.  Hears a slight click with extension -Has tried Tylenol and heat  Neuralgia: -h/o neck surgery 2 yrs ago.  Still has numbness in R hand s/p procedure.  Surgery by Dr. Jordan LikesPool -followed by neurosurgery/pain management.  Seen by Dr. Abigail MiyamotoBarco. -Now on Cymbalta 30 mg for pain -Going to PT -Notes improvement  Skin lesion: -at last OFV, pt mentioned a area on her left upper chest that was not healing -present times "a while" -Pt has a tendency to "pick"/scratch the area -Area did not fully heal 2/2 pt picking it. -has tried vaseline -Area now healing as pt has not been touching it.  Snoring: -Pt had appointment with pulmonology -At home sleep study to be scheduled  Past Medical History:  Diagnosis Date  . Anxiety   . Arthritis   . Atrial fibrillation (HCC)    a. paroxysmal, uses PRN Flecainide  . Depression   . Diabetes mellitus without complication (HCC)   . Dysrhythmia   . GERD (gastroesophageal reflux disease)   . Hypertension   . Obesity   . PCOS (polycystic ovarian syndrome)   . UTI (lower urinary tract infection)     Allergies  Allergen Reactions  . Sulfa Antibiotics Hives    ROS General: Denies fever, chills, night sweats,  changes in weight, changes in appetite HEENT: Denies headaches, ear pain, changes in vision, rhinorrhea, sore throat CV: Denies CP, palpitations, SOB, orthopnea Pulm: Denies SOB, cough, wheezing GI: Denies abdominal pain, nausea, vomiting, diarrhea, constipation GU: Denies dysuria, hematuria, frequency, vaginal discharge Msk: Denies muscle cramps, joint pains Neuro: Denies weakness, numbness, tingling Skin: Denies rashes, bruising Psych: Denies depression, anxiety, hallucinations      Objective:    Blood pressure 138/86, pulse 98, temperature 98.1 F (36.7 C), temperature source Oral, weight 248 lb (112.5 kg), SpO2 98 %.  Gen. Pleasant, well-nourished, in no distress, normal affect   HEENT: [place]/AT, face symmetric, no scleral icterus, PERRLA, nares patent without drainage Lungs: no accessory muscle use, CTAB, no wheezes or rales Cardiovascular: RRR, no m/r/g, no peripheral edema Musculoskeletal: FROM of R thumb, no edema, erythema, or TTP of thumb.  No tenderness in R lateral wrist.  Negative Tinel's and Phalen's.  No deformities, no cyanosis or clubbing, normal tone Neuro:  A&Ox3, CN II-XII intact, normal gait Skin:  Warm, no rash.  L upper chest with 1 cm area of skin, healing, dry, slightly rough.  Skin of R hand and wrist rough, dry, eczematous in appearance.  Wt Readings from Last 3 Encounters:  09/04/18 248 lb (112.5 kg)  08/31/18 248 lb 3.2 oz (112.6 kg)  08/28/18 248 lb 3.2 oz (112.6 kg)    Lab Results  Component Value Date   WBC  10.3 07/31/2018   HGB 16.1 (H) 07/31/2018   HCT 47.0 (H) 07/31/2018   PLT 294.0 07/31/2018   GLUCOSE 384 (H) 07/31/2018   CHOL 204 (H) 08/22/2017   TRIG 302.0 (H) 08/22/2017   HDL 27.60 (L) 08/22/2017   LDLDIRECT 140.0 08/22/2017   ALT 40 11/20/2015   AST 42 (H) 11/20/2015   NA 136 07/31/2018   K 4.3 07/31/2018   CL 98 07/31/2018   CREATININE 0.79 07/31/2018   BUN 13 07/31/2018   CO2 27 07/31/2018   TSH 0.99 08/23/2018   INR 1.00  06/13/2018   HGBA1C 10.7 (H) 08/23/2018   MICROALBUR 7.4 (H) 12/26/2017    Assessment/Plan:  Pain of right thumb  -possibly 2/2 overuse -discussed NSAIDs, Ice, rest - Plan: predniSONE (DELTASONE) 10 MG tablet  PCOS (polycystic ovarian syndrome) -continue Metformin (also taking for DM II) -continue f/u with OB/Gyn for further management and u/s  Neuralgia -Continue Cymbalta 30 mg -Continue following with Dr. Abigail MiyamotoBarco for further management. -continue PT  Dermatitis  -improving -discussed avoiding scratching/picking at skin -given handout - Plan: hydrocortisone cream 1 %  Snoring  -continue f/u with pulm -awaiting sleep study.  F/u prn  Abbe AmsterdamShannon Banks, MD

## 2018-09-05 ENCOUNTER — Encounter: Payer: Self-pay | Admitting: Internal Medicine

## 2018-09-05 ENCOUNTER — Ambulatory Visit: Payer: 59 | Admitting: Internal Medicine

## 2018-09-05 VITALS — BP 138/88 | HR 96 | Ht 68.5 in | Wt 248.0 lb

## 2018-09-05 DIAGNOSIS — E1140 Type 2 diabetes mellitus with diabetic neuropathy, unspecified: Secondary | ICD-10-CM

## 2018-09-05 DIAGNOSIS — Z794 Long term (current) use of insulin: Secondary | ICD-10-CM

## 2018-09-05 MED ORDER — INSULIN ASPART 100 UNIT/ML FLEXPEN: 8.0000 [IU] | PEN_INJECTOR | Freq: Three times a day (TID) | SUBCUTANEOUS | 11 refills | Status: DC

## 2018-09-05 MED ORDER — INSULIN PEN NEEDLE 32G X 4 MM MISC: 3 refills | Status: DC

## 2018-09-05 MED ORDER — BASAGLAR KWIKPEN 100 UNIT/ML ~~LOC~~ SOPN: 28.0000 [IU] | PEN_INJECTOR | Freq: Every day | SUBCUTANEOUS | 1 refills | Status: DC

## 2018-09-05 NOTE — Patient Instructions (Addendum)
-   Increase basaglar to 28 units daily  - Start Novolog 8 units with each meal  - Continue Metformin 750 mg twice a day with meals   - check sugar before meals and bedtime   - Choose healthy, lower carb lower calorie snacks: toss salad, cooked vegetables, cottage cheese, peanut butter, low fat cheese / string cheese, lower sodium deli meat, tuna salad or chicken salad     HOW TO TREAT LOW BLOOD SUGARS (Blood sugar LESS THAN 70 MG/DL)  Please follow the RULE OF 15 for the treatment of hypoglycemia treatment (when your (blood sugars are less than 70 mg/dL)    STEP 1: Take 15 grams of carbohydrates when your blood sugar is low, which includes:   3-4 GLUCOSE TABS  OR  3-4 OZ OF JUICE OR REGULAR SODA OR  ONE TUBE OF GLUCOSE GEL     STEP 2: RECHECK blood sugar in 15 MINUTES STEP 3: If your blood sugar is still low at the 15 minute recheck --> then, go back to STEP 1 and treat AGAIN with another 15 grams of carbohydrates.

## 2018-09-05 NOTE — Progress Notes (Signed)
Name: Julia Dickerson Mealey  MRN/ DOB: [id number], 1976/01/05   Age/ Sex: 43 y.o., female    PCP: Deeann SaintBanks, Shannon R, MD   Reason for Endocrinology Evaluation: Type 2 Diabetes Mellitus     Date of Initial Endocrinology Visit: 09/05/2018     PATIENT IDENTIFIER: [name] is a 43 y.o. female with a past medical history of , PCOS, T2DM, PAF and HTN. The patient presented for initial endocrinology clinic visit on 09/05/2018 for consultative assistance with her diabetes management.    HPI: [name] was    Diagnosed with T2DM 2000's  Prior Medications tried/Intolerance: she was initially  on metformin, then switched to MDI insulin regimen which helped with her hyperglycemia but was switched to orals again in 2012 due to improve glucose control, Basal insulin was added in 2019, she tried victoza but developed severe nausea.   Currently checking blood sugars 4-5  x / day,  before and after meals  Hypoglycemia episodes : no       Hemoglobin A1c has ranged from 8.7%  in 2019, peaking at 10.7% in 07/2018 Patient required assistance for hypoglycemia: no Patient has required hospitalization within the last 1 year from hyper or hypoglycemia: no   In terms of diet, the patient does drinks occasional sugar-sweetened beverages, she easts 3 meals a day and snacks between the meals.   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    PROLACTIN: She [facility] to [facility] She was evaluated there for amenorrhea.  LMP was December, 2019 . Menarche was at age at around 8912-14 yrs. She historically had irregular periods, she was started on OCP until 2002 . But from 2002 until 2018 periods were regular.   No previous pregnancy, had one miscarriage at 14 weeks.  Scheduled for pelvic ultrasound in next week, pelvic exam is scheduled in April.    HOME DIABETES REGIMEN: Metformin 750 mg BID Basaglar 20 units QHS   Statin:  yes ACE-I/ARB:Yes Prior Diabetic Education:Yes   METER DOWNLOAD SUMMARY:not downloadable  BG > 250 mg/dL    DIABETIC COMPLICATIONS: Microvascular complications:   Neuropathy   Denies: CKD, retinopathy   Last eye exam: Completed 07/2018  Macrovascular complications:    Denies: CAD, PVD, CVA   PAST HISTORY: Past Medical History:  Past Medical History:  Diagnosis Date  . Anxiety   . Arthritis   . Atrial fibrillation (HCC)    a. paroxysmal, uses PRN Flecainide  . Depression   . Diabetes mellitus without complication (HCC)   . Dysrhythmia   . GERD (gastroesophageal reflux disease)   . Hypertension   . Obesity   . PCOS (polycystic ovarian syndrome)   . UTI (lower urinary tract infection)    Past Surgical History:  Past Surgical History:  Procedure Laterality Date  . DILATION AND CURETTAGE OF UTERUS  2011 and 2012   x2  . GXT - Graded Exercise Tolerance Test  07/22/2016   Exercise time 6 minutes - 7 METs. Maximum heart rate 146 BPM. - No EKG changes. No arrhythmias or chest pain.: Low risk test.  . TRANSTHORACIC ECHOCARDIOGRAM  06/2016   EF 6570% with mild LVH. Normal wall motion and diastolic function. Mild RAE. Otherwise normal.  . WISDOM TOOTH EXTRACTION        Social History:  reports that she quit smoking 12 days ago. Her smoking use included cigarettes and e-cigarettes. She has a 10.50  pack-year smoking history. She has never used smokeless tobacco. She reports current alcohol use. She reports that she does not use drugs. Family History:  Family History  Problem Relation Age of Onset  . Heart failure Father   . Heart attack Father   . Cancer Father   . Diabetes Father   . Hearing loss Father   . Hyperlipidemia Father   . Hypertension Father   . Arthritis Mother   . Diabetes Mother   . COPD Maternal Grandmother   . Heart disease Maternal Grandmother   . Hyperlipidemia Maternal Grandmother   . Hyperlipidemia Maternal Grandfather   . Hypertension  Maternal Grandfather   . Hearing loss Maternal Grandfather   . Alcohol abuse Maternal Grandfather   . Arthritis Maternal Grandfather   . Stroke Maternal Grandfather   . Hyperlipidemia Paternal Grandmother   . Miscarriages / Stillbirths Paternal Grandmother   . Kidney disease Paternal Grandfather   . Hypertension Paternal Grandfather   . Heart attack Paternal Grandfather   . Diabetes Paternal Grandfather   . Arthritis Sister      HOME MEDICATIONS: Allergies as of 09/05/2018      Reactions   Sulfa Antibiotics Hives      Medication List       Accurate as of September 05, 2018 10:46 AM. Always use your most recent med list.        atorvastatin 40 MG tablet Commonly known as:  LIPITOR TAKE 1 TABLET BY MOUTH EVERY DAY   BASAGLAR KWIKPEN 100 UNIT/ML Sopn INJECT 0.2 MLS (20 UNITS TOTAL) INTO THE SKIN AT BEDTIME.   diltiazem 120 MG 24 hr capsule Commonly known as:  CARDIZEM CD Take 1 capsule (120 mg total) by mouth as needed.   diltiazem 180 MG 24 hr capsule Commonly known as:  CARDIZEM CD Take 1 capsule (180 mg total) by mouth daily. NEED OV.   DULoxetine 30 MG capsule Commonly known as:  CYMBALTA Take 30 mg by mouth daily.   ELIQUIS 5 MG Tabs tablet Generic drug:  apixaban TAKE 1 TABLET (5 MG TOTAL) 2 (TWO) TIMES DAILY BY MOUTH.   flecainide 100 MG tablet Commonly known as:  TAMBOCOR Take 2 tablets (200 mg total) by mouth as needed. For A-fib   hydrocortisone cream 1 % Apply 1 application topically 2 (two) times daily.   Insulin Pen Needle 32G X 4 MM Misc Use daily with insulin pen.   lisinopril 10 MG tablet Commonly known as:  PRINIVIL,ZESTRIL Take 1 tablet (10 mg total) by mouth daily.   metFORMIN 750 MG 24 hr tablet Commonly known as:  GLUCOPHAGE-XR TAKE 1 TABLET (750 MG TOTAL) BY MOUTH 2 (TWO) TIMES DAILY WITH A MEAL.   omeprazole 20 MG capsule Commonly known as:  PRILOSEC        ALLERGIES: Allergies  Allergen Reactions  . Sulfa Antibiotics  Hives     REVIEW OF SYSTEMS: A comprehensive ROS was conducted with the patient and is negative except as per HPI and below:  Review of Systems  Constitutional: Negative for fever and weight loss.  HENT: Negative for congestion and sore throat.   Eyes: Negative for blurred vision and pain.  Respiratory: Negative for cough and shortness of breath.   Cardiovascular: Negative for chest pain and palpitations.  Gastrointestinal: Negative for diarrhea and nausea.  Genitourinary: Positive for frequency.  Musculoskeletal: Positive for joint pain.  Neurological: Positive for tingling. Negative for tremors.  Endo/Heme/Allergies: Positive for polydipsia.  Psychiatric/Behavioral: Negative for depression. The  patient is not nervous/anxious.       OBJECTIVE:   VITAL SIGNS: BP 138/88 (BP Location: Right Arm, Patient Position: Sitting, Cuff Size: Normal)   Pulse 96   Ht 5' 8.5" (1.74 m)   Wt 248 lb (112.5 kg)   SpO2 96%   BMI 37.16 kg/m    PHYSICAL EXAM:  General: Pt appears well and is in NAD  Hydration: Well-hydrated with moist mucous membranes and good skin turgor  HEENT: Head: Unremarkable with good dentition. Oropharynx clear without exudate.  Eyes: External eye exam normal without stare, lid lag or exophthalmos.  EOM intact.  PERRL.  Neck: General: Supple without adenopathy or carotid bruits. Thyroid: Thyroid size normal.  No goiter or nodules appreciated. No thyroid bruit.  Lungs: Clear with good BS bilat with no rales, rhonchi, or wheezes  Heart: RRR with normal S1 and S2 and no gallops; no murmurs; no rub  Abdomen: Normoactive bowel sounds, soft, nontender, without masses or organomegaly palpable  Extremities:  Lower extremities - No pretibial edema. No lesions.  Skin: Normal texture and temperature to palpation. No rash noted. No Acanthosis nigricans/skin tags. No lipohypertrophy.  Neuro: MS is good with appropriate affect, pt is alert and Ox3    DM foot exam: 09/05/18 The  skin of the feet is intact without sores or ulcerations. The pedal pulses are 1+ on right and 1+ on left. The sensation is absent  to a screening 5.07, 10 gram monofilament bilaterally   DATA REVIEWED:  Lab Results  Component Value Date   HGBA1C 10.7 (H) 08/23/2018   HGBA1C 10.5 (H) 06/19/2018   HGBA1C 9.2 (H) 12/26/2017   Lab Results  Component Value Date   MICROALBUR 7.4 (H) 12/26/2017   CREATININE 0.79 07/31/2018   Lab Results  Component Value Date   MICRALBCREAT 6.8 12/26/2017    Lab Results  Component Value Date   CHOL 204 (H) 08/22/2017   HDL 27.60 (L) 08/22/2017   LDLDIRECT 140.0 08/22/2017   TRIG 302.0 (H) 08/22/2017   CHOLHDL 7 08/22/2017       Results for Julia CourserWADE, Raima Dickerson (MRN [id number]) as of 09/05/2018 18:59  Ref. Range 08/23/2018 10:57  FSH Latest Units: mIU/mL 38.1  Prolactin Latest Units: ng/mL 1.6 (L)  Results for Julia CourserWADE, Susy Dickerson (MRN [id number]) as of 09/05/2018 18:59  Ref. Range 08/23/2018 10:57  TSH Latest Units: mIU/L 0.99  Results for Julia CourserWADE, Galadriel Dickerson (MRN [id number]) as of 09/05/2018 18:59  Ref. Range 07/31/2018 11:08  TSH Latest Ref Range: 0.35 - 4.50 uIU/mL 1.28  T4,Free(Direct) Latest Ref Range: 0.60 - 1.60 ng/dL 8.461.12    ASSESSMENT / PLAN / RECOMMENDATIONS:   1) Type 2 Diabetes Mellitus, Poorly controlled, With neuropathic complications - Most recent A1c of 10.7 %. Goal A1c < 7.0 %.    Plan: GENERAL: I have discussed with the patient the pathophysiology of diabetes. We went over the natural progression of the disease. We talked about both insulin resistance and insulin deficiency. We stressed the importance of lifestyle changes including diet and exercise. I explained the complications associated with diabetes including retinopathy, nephropathy, neuropathy as well as increased risk of cardiovascular disease. We went over the benefit seen with glycemic control.   I explained to the patient that diabetic patients are at higher than normal risk for  amputations. The patient was informed that diabetes is the number one cause of non-traumatic amputations in [place].    Patient was advised to avoid sugar sweetened beverages, and to avoid snacks.  Patient patient is leaning more towards an MDI regimen of insulin again, we will start her on prandial insulin in addition to continue metformin and basal insulin, we discussed the possibility of adding a GLP-1 agonist in the future, in the hopes to be able to get her off prandial insulin again, she is in agreement of this plan.  MEDICATIONS:  Increase Basaglar to 28 units daily  Start NovoLog 8 units with each meal  Continue Metformin 750 mg XR twice a day with meals  EDUCATION / INSTRUCTIONS:  BG monitoring instructions: Patient is instructed to check her blood sugars 4 times a day, before meals and bedtime.  Call [facility] if: BG persistently < 70 or > 300. . I reviewed the Rule of 15 for the treatment of hypoglycemia in detail with the patient. Literature supplied.   2) Diabetic complications:   Eye: Does not have known diabetic retinopathy.   Neuro/ Feet: Does have known diabetic peripheral neuropathy.  Renal: Patient does not have known baseline CKD. She is on an ACEI/ARB at present.   3) Lipids: Patient is on a statin.  Triglycerides are elevated but this is due to hypoglycemia and carbohydrate intake  4) Hypertension: She is at goal of < 140/90 mmHg.   5) Low Prolactin :  -There are no clinical consequences for low prolactin levels, unless there is a suspicion for hypopituitarism, but in a patient who has high FSH levels and normal TSH levels there is no concern of hypopituitarism, as these 2 indices are indication for normal pituitary function.   Follow-up in 2 weeks   Signed electronically by: Lyndle Herrlich, MD  [facility]  [facility] [address] [place] [place], [place] [address] Phone: [phone] FAX:  [phone]   CC: Deeann Saint, MD [address] [place] [address] Phone: [phone]  Fax: [phone]    Return to Endocrinology clinic as below: Future Appointments  Date Time Provider Department Center  09/11/2018 10:30 AM [facility] None  09/11/2018 11:20 AM Genia Del, MD [facility]  11/10/2018  3:00 PM Genia Del, MD [facility]

## 2018-09-07 ENCOUNTER — Other Ambulatory Visit: Payer: Self-pay | Admitting: Internal Medicine

## 2018-09-07 ENCOUNTER — Encounter: Payer: Self-pay | Admitting: Internal Medicine

## 2018-09-07 DIAGNOSIS — M48062 Spinal stenosis, lumbar region with neurogenic claudication: Secondary | ICD-10-CM | POA: Diagnosis not present

## 2018-09-07 DIAGNOSIS — M2560 Stiffness of unspecified joint, not elsewhere classified: Secondary | ICD-10-CM | POA: Diagnosis not present

## 2018-09-07 DIAGNOSIS — M545 Low back pain: Secondary | ICD-10-CM | POA: Diagnosis not present

## 2018-09-07 MED ORDER — INSULIN LISPRO (1 UNIT DIAL) 100 UNIT/ML (KWIKPEN): 8.0000 [IU] | PEN_INJECTOR | Freq: Three times a day (TID) | SUBCUTANEOUS | 11 refills | Status: DC

## 2018-09-08 DIAGNOSIS — M48062 Spinal stenosis, lumbar region with neurogenic claudication: Secondary | ICD-10-CM | POA: Diagnosis not present

## 2018-09-08 DIAGNOSIS — M2560 Stiffness of unspecified joint, not elsewhere classified: Secondary | ICD-10-CM | POA: Diagnosis not present

## 2018-09-08 DIAGNOSIS — M545 Low back pain: Secondary | ICD-10-CM | POA: Diagnosis not present

## 2018-09-11 ENCOUNTER — Ambulatory Visit: Payer: 59 | Admitting: Obstetrics & Gynecology

## 2018-09-11 ENCOUNTER — Ambulatory Visit (INDEPENDENT_AMBULATORY_CARE_PROVIDER_SITE_OTHER): Payer: 59

## 2018-09-11 DIAGNOSIS — N951 Menopausal and female climacteric states: Secondary | ICD-10-CM

## 2018-09-11 DIAGNOSIS — N83202 Unspecified ovarian cyst, left side: Secondary | ICD-10-CM | POA: Diagnosis not present

## 2018-09-11 DIAGNOSIS — N914 Secondary oligomenorrhea: Secondary | ICD-10-CM | POA: Diagnosis not present

## 2018-09-11 DIAGNOSIS — Z8742 Personal history of other diseases of the female genital tract: Secondary | ICD-10-CM | POA: Diagnosis not present

## 2018-09-11 NOTE — Progress Notes (Signed)
    Julia Dickerson 09-14-1975 [id number]        43 y.o.  G2P0020  Married  RP: Oligomenorrhea and Left Ovarian Cyst for Pelvic US  HPI: Oligomenorrhea with long h/o PCOS and infertility.  LMP 07/03/2018 and prior to that one it was in 11/2017.  Didn't start on the Progestin-only pill because her FSH was at 38.1 in 07/2018.  No pelvic pain.  Pelvic US 12/2017 Left ovarian cyst 2.7 cm with low level echoes.   OB History  Gravida Para Term Preterm AB Living  2       2    SAB TAB Ectopic Multiple Live Births               # Outcome Date GA Lbr Len/2nd Weight Sex Delivery Anes PTL Lv  2 AB           1 AB             Past medical history,surgical history, problem list, medications, allergies, family history and social history were all reviewed and documented in the EPIC chart.   Directed ROS with pertinent positives and negatives documented in the history of present illness/assessment and plan.  Exam:  There were no vitals filed for this visit. General appearance:  Normal  Pelvic US today: T/V images.  Uterus anteverted homogeneous measuring 7.1 x 4.01 x 2.51 cm.  Endometrial lining is thin and normal at 2.1 mm.  Right ovary with an echo-free thick-irregular wall cyst measuring 1.5 x 1.9 x 1.5 cm with negative color flow Doppler.  Left ovary normal.  Previous cyst on the left ovary not seen.  Right and left adnexa negative.  No free fluid in the posterior cul-de-sac.  [facility] 08/23/2018 38.1   Assessment/Plan:  43 y.o. G2P0020   1. History of Left ovarian cyst Pelvic US findings reviewed with patient.  Left ovarian cyst resolved.  Follicle present on the right ovary.  Normal uterus with a thin endometrial lining at 2.1 mm.  No free fluid in the posterior cul-de-sac.  Patient reassured about the ultrasound findings.  2. History of PCOS Given history of PCOS with infertility and now in perimenopause, patient declines additional contraception.  3. Perimenopausal FSH in menopausal range  at 38.1 in January 2020.  Very thin endometrial lining at 2.1 mm on ultrasound today.  Patient declines cyclic progestins.  Will reevaluate the endometrial lining by ultrasound as needed within a year.  Counseling on above issues and coordination of care more than 50% for 25 minutes.  Genia Del MD, 10:59 AM 09/11/2018

## 2018-09-12 ENCOUNTER — Encounter: Payer: Self-pay | Admitting: Obstetrics & Gynecology

## 2018-09-12 DIAGNOSIS — M48062 Spinal stenosis, lumbar region with neurogenic claudication: Secondary | ICD-10-CM | POA: Diagnosis not present

## 2018-09-12 DIAGNOSIS — M2560 Stiffness of unspecified joint, not elsewhere classified: Secondary | ICD-10-CM | POA: Diagnosis not present

## 2018-09-12 DIAGNOSIS — M545 Low back pain: Secondary | ICD-10-CM | POA: Diagnosis not present

## 2018-09-12 NOTE — Patient Instructions (Signed)
1. History of Left ovarian cyst Pelvic US findings reviewed with patient.  Left ovarian cyst resolved.  Follicle present on the right ovary.  Normal uterus with a thin endometrial lining at 2.1 mm.  No free fluid in the posterior cul-de-sac.  Patient reassured about the ultrasound findings.  2. History of PCOS Given history of PCOS with infertility and now in perimenopause, patient declines additional contraception.  3. Perimenopausal FSH in menopausal range at 38.1 in January 2020.  Very thin endometrial lining at 2.1 mm on ultrasound today.  Patient declines cyclic progestins.  Will reevaluate the endometrial lining by ultrasound as needed within a year.  Julia Dickerson, it was a pleasure seeing you today!

## 2018-09-15 LAB — HM DIABETES EYE EXAM

## 2018-09-19 ENCOUNTER — Encounter: Payer: Self-pay | Admitting: Family Medicine

## 2018-09-19 ENCOUNTER — Ambulatory Visit: Payer: Self-pay | Admitting: Internal Medicine

## 2018-09-19 DIAGNOSIS — M2560 Stiffness of unspecified joint, not elsewhere classified: Secondary | ICD-10-CM | POA: Diagnosis not present

## 2018-09-19 DIAGNOSIS — M545 Low back pain: Secondary | ICD-10-CM | POA: Diagnosis not present

## 2018-09-19 DIAGNOSIS — M48062 Spinal stenosis, lumbar region with neurogenic claudication: Secondary | ICD-10-CM | POA: Diagnosis not present

## 2018-09-20 ENCOUNTER — Ambulatory Visit: Payer: Self-pay | Admitting: Internal Medicine

## 2018-09-25 DIAGNOSIS — R0683 Snoring: Secondary | ICD-10-CM

## 2018-09-26 DIAGNOSIS — G4733 Obstructive sleep apnea (adult) (pediatric): Secondary | ICD-10-CM | POA: Diagnosis not present

## 2018-09-26 DIAGNOSIS — M2560 Stiffness of unspecified joint, not elsewhere classified: Secondary | ICD-10-CM | POA: Diagnosis not present

## 2018-09-26 DIAGNOSIS — M545 Low back pain: Secondary | ICD-10-CM | POA: Diagnosis not present

## 2018-09-26 DIAGNOSIS — M48062 Spinal stenosis, lumbar region with neurogenic claudication: Secondary | ICD-10-CM | POA: Diagnosis not present

## 2018-09-27 ENCOUNTER — Telehealth: Payer: Self-pay | Admitting: Pulmonary Disease

## 2018-09-27 DIAGNOSIS — G4733 Obstructive sleep apnea (adult) (pediatric): Secondary | ICD-10-CM | POA: Diagnosis not present

## 2018-09-27 NOTE — Telephone Encounter (Signed)
HST 09/26/18 >> AHI 9.5, SpO2 low 79%   Please inform her that her sleep study shows mild obstructive sleep apnea.  Please arrange for ROV with me or NP to discuss treatment options.

## 2018-09-29 NOTE — Telephone Encounter (Signed)
lmtcb

## 2018-10-02 ENCOUNTER — Encounter: Payer: 59 | Attending: Internal Medicine | Admitting: Dietician

## 2018-10-02 ENCOUNTER — Encounter: Payer: Self-pay | Admitting: Dietician

## 2018-10-02 DIAGNOSIS — E1140 Type 2 diabetes mellitus with diabetic neuropathy, unspecified: Secondary | ICD-10-CM | POA: Insufficient documentation

## 2018-10-02 DIAGNOSIS — Z794 Long term (current) use of insulin: Secondary | ICD-10-CM | POA: Insufficient documentation

## 2018-10-02 NOTE — Progress Notes (Signed)
Diabetes Self-Management Education  Visit Type: First/Initial  Appt. Start Time: 1000 Appt. End Time: 1140  10/02/2018  [name], identified by name and date of birth, is a 43 y.o. female with a diagnosis of Diabetes: Type 2.   ASSESSMENT History includes DM type 2 for greater than 10 years, PAF, HTN, GERD, PCOS, depression, vitamin D deficiency, neuropathy, low prolactin, mild OSA.  Medications include:  Metformin XR 750 mg bid with meals, Basaglar 29 units q HS, Humalog 8 units before each meal.  She has a hard time remembering the Basaglar at times.  Discussed that she could begin taking this before dinner.  Patient lives with her husband.  He also has diabetes.  Her husband cooks dinner (Hamberger helper etc).  She eats a lot of fast food (breakfast and lunch).  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  Back/neck issues interfere with some exercise but there is a gym at her work.  Discussed PCOS as well related to nutrition and suggested supplements but to ask MD before considering Omega 3 supplement.  Height _0  (1.727 m), weight 248 lb (112.5 kg). Body mass index is 37.71 kg/m.  Diabetes Self-Management Education - 10/02/18 1007      Visit Information   Visit Type  First/Initial      Initial Visit   Diabetes Type  Type 2    Are you currently following a meal plan?  No    Are you taking your medications as prescribed?  Yes    Date Diagnosed  before 2010 and prediabetes 2002      Health Coping   How would you rate your overall health?  Poor      Psychosocial Assessment   Patient Belief/Attitude about Diabetes  Motivated to manage diabetes    Self-care barriers  None    Self-management support  Doctor's office    Other persons present  Patient;Family Member    Patient Concerns  Nutrition/Meal planning;Glycemic Control;Weight Control    Special Needs  None    Preferred Learning Style  No preference indicated    Learning Readiness  Ready    How often do you need to have someone help you when you read instructions, pamphlets, or other written materials from your doctor or pharmacy?  1 - Never    What is the last grade level you completed in school?  BA      Pre-Education Assessment   Patient understands the diabetes disease and treatment process.  Needs Instruction    Patient understands incorporating nutritional management into lifestyle.  Needs Instruction    Patient undertands incorporating physical activity into lifestyle.  Needs Instruction    Patient understands using medications safely.  Needs Instruction    Patient understands monitoring blood glucose, interpreting and using results  Needs Instruction    Patient understands prevention, detection, and treatment of acute complications.  Needs Instruction    Patient understands prevention, detection, and treatment of chronic complications.  Needs Instruction    Patient understands how to develop strategies to address psychosocial issues.  Needs Instruction    Patient understands how to develop strategies to promote health/change behavior.  Needs Instruction      Complications   Last HgB A1C per patient/outside source  10.7 %   08/23/18 and is her highest A1C   How often do you check your blood sugar?  3-4 times/day    Fasting Blood glucose range (mg/dL)  >  200    Postprandial Blood glucose range (mg/dL)  >200   250-300   Number of hypoglycemic episodes per month  0    Number of hyperglycemic episodes per week  21    Can you tell when your blood sugar is high?  No    Have you had a dilated eye exam in the past 12 months?  Yes    Have you had a dental exam in the past 12 months?  No    Are you checking your feet?  Yes    How many days per week are you checking your feet?  7      Dietary Intake   Breakfast  McDonald's or Taco Bell or Wendy's or sub OR chick-filA OR eggs, bacon or sausage, home fries, biscuit occasionally OR egg sandwich on  biscuit or Dave's Killer bread   noon   Snack (morning)  chips or popcorn or NABS or mixed nuts and has been trying to avoid candy so chooses SF chocolates    Lunch  chicken salad (canned chicken, mayo, cheese, bacon bits) OR canned soup or chilli or Spagettio's OR McAllisters (BLT with Avocado) and broccoli and cheddar soup or mac and cheese OR hotdogs, fries or hush puppies or Moes   6   Snack (afternoon)  none    Dinner  sometimes skips OR chocolate PB cheerios with 1% or 2% milk OR hamberger helper OR meat and vegetables    Snack (evening)  occasional cereal if not before or popcorn    Beverage(s)  diet soda, water, flavored water, coffee with creamer and sugar sub, oocasional unsweetened tea      Exercise   Exercise Type  Light (walking / raking leaves)   PT for lower back   How many days per week to you exercise?  1    How many minutes per day do you exercise?  60    Total minutes per week of exercise  60      Patient Education   Previous Diabetes Education  No    Disease state   Definition of diabetes, type 1 and 2, and the diagnosis of diabetes    Nutrition management   Role of diet in the treatment of diabetes and the relationship between the three main macronutrients and blood glucose level;Food label reading, portion sizes and measuring food.;Information on hints to eating out and maintain blood glucose control.;Meal options for control of blood glucose level and chronic complications.;Carbohydrate counting    Physical activity and exercise   Role of exercise on diabetes management, blood pressure control and cardiac health.;Helped patient identify appropriate exercises in relation to his/her diabetes, diabetes complications and other health issue.    Medications  Reviewed patients medication for diabetes, action, purpose, timing of dose and side effects.    Monitoring  Purpose and frequency of SMBG.;Identified appropriate SMBG and/or A1C goals.;Daily foot exams;Yearly dilated eye  exam    Acute complications  Taught treatment of hypoglycemia - the 15 rule.    Chronic complications  Relationship between chronic complications and blood glucose control;Dental care;Reviewed with patient heart disease, higher risk of, and prevention;Retinopathy and reason for yearly dilated eye exams    Psychosocial adjustment  Role of stress on diabetes;Worked with patient to identify barriers to care and solutions;Identified and addressed patients feelings and concerns about diabetes      Individualized Goals (developed by patient)   Nutrition  Follow meal plan discussed    Physical Activity  Exercise 5-7  days per week;30 minutes per day    Medications  take my medication as prescribed    Monitoring   test my blood glucose as discussed    Problem Solving  eating out    Reducing Risk  examine blood glucose patterns;do foot checks daily;increase portions of healthy fats    Health Coping  discuss diabetes with (comment)   MD, RD, CDCES     Post-Education Assessment   Patient understands the diabetes disease and treatment process.  Demonstrates understanding / competency    Patient understands incorporating nutritional management into lifestyle.  Needs Review    Patient undertands incorporating physical activity into lifestyle.  Demonstrates understanding / competency    Patient understands using medications safely.  Demonstrates understanding / competency    Patient understands monitoring blood glucose, interpreting and using results  Demonstrates understanding / competency    Patient understands prevention, detection, and treatment of acute complications.  Demonstrates understanding / competency    Patient understands prevention, detection, and treatment of chronic complications.  Demonstrates understanding / competency    Patient understands how to develop strategies to address psychosocial issues.  Demonstrates understanding / competency    Patient understands how to develop strategies to  promote health/change behavior.  Needs Review      Outcomes   Expected Outcomes  Demonstrated interest in learning. Expect positive outcomes    Future DMSE  4-6 wks    Program Status  Completed       Individualized Plan for Diabetes Self-Management Training:   Learning Objective:  Patient will have a greater understanding of diabetes self-management. Patient education plan is to attend individual and/or group sessions per assessed needs and concerns.   Plan:   Patient Instructions  Plan: Consider 2000 units of vitamin D daily.  Mindfulness  Choices  Portions  Avoid snacks (especially stress/boredom eating)   If hungry choose a low carb snack Practice carb counting and aim to keep carbohydrate intake consistent. Each little bit of exercise adds up Consider Calorie Edison Pace app  Aim for 3-4 Carb Choices per meal (45-60 grams) +/- 1 either way  Aim for 0-5 Carbs per snack if hungry  Include protein in moderation with your meals and snacks Consider reading food labels for Total Carbohydrate of foods Consider  increasing your activity level by walking or other for 30 minutes daily as tolerated Continue checking BG at alternate times per day  Continue taking medication as directed by MD    Expected Outcomes:  Demonstrated interest in learning. Expect positive outcomes  Education material provided: Food label handouts, Meal plan card and Snack sheet, eating out tips, How to Thrive:  A guide fro Your Journey with Diabetes from ADA, PCOS, Inositol  If problems or questions, patient to contact team via:  Phone  Future DSME appointment: 4-6 wks

## 2018-10-02 NOTE — Patient Instructions (Addendum)
Plan: Consider 2000 units of vitamin D daily.  Mindfulness  Choices  Portions  Avoid snacks (especially stress/boredom eating)   If hungry choose a low carb snack Practice carb counting and aim to keep carbohydrate intake consistent. Each little bit of exercise adds up Consider Calorie Brooke Dare app  Aim for 3-4 Carb Choices per meal (45-60 grams) +/- 1 either way  Aim for 0-5 Carbs per snack if hungry  Include protein in moderation with your meals and snacks Consider reading food labels for Total Carbohydrate of foods Consider  increasing your activity level by walking or other for 30 minutes daily as tolerated Continue checking BG at alternate times per day  Continue taking medication as directed by MD

## 2018-10-02 NOTE — Telephone Encounter (Signed)
Patient returning phone call from 09/29/18. I let her aware of sleep results and appointment made with Rubye Oaks NP on 10/05/18 at1030. Nothing further needed.

## 2018-10-03 ENCOUNTER — Ambulatory Visit: Payer: Self-pay | Admitting: Internal Medicine

## 2018-10-03 DIAGNOSIS — M2560 Stiffness of unspecified joint, not elsewhere classified: Secondary | ICD-10-CM | POA: Diagnosis not present

## 2018-10-03 DIAGNOSIS — M48062 Spinal stenosis, lumbar region with neurogenic claudication: Secondary | ICD-10-CM | POA: Diagnosis not present

## 2018-10-03 DIAGNOSIS — M545 Low back pain: Secondary | ICD-10-CM | POA: Diagnosis not present

## 2018-10-04 ENCOUNTER — Ambulatory Visit: Payer: Self-pay | Admitting: Internal Medicine

## 2018-10-04 NOTE — Progress Notes (Deleted)
Name: Julia Dickerson  Age/ Sex: 43 y.o., female   MRN/ DOB: [id number], 04-07-1976     PCP: Deeann Saint, MD   Reason for Endocrinology Evaluation: Type 2 diabetes Mellitus  Initial Endocrine Consultative Visit:  09/05/2018    PATIENT IDENTIFIER: [name] is a 43 y.o. female with a past medical history of PCOS, T2DM, PAF and HTN.  The patient has followed with Endocrinology clinic since 09/05/2018 for consultative assistance with management of her diabetes.  DIABETIC HISTORY:  Julia Dickerson was diagnosed with T2DM since the 2000's.  She was initially on metformin, she was switched to MDI insulin regimen which helped control her hyperglycemia at the time, the patient was subsequently switched to oral glycemic agents in 2012 due to improve improve glucose control.  She was restarted on basal insulin in 2019 due to persistent hyperglycemia, she has tried Victoza in the past with severe nausea. Her hemoglobin A1c has ranged from 8.7% in in 2019, peaking at 10.7% in 08/14/2018.   Prandial insulin started in February 2020, in addition to continuing her metformin and basal insulin.  SUBJECTIVE:   During the last visit (09/05/2018): Her A1c was 10.7% at the time.  We increase Basaglar to 28 units daily, we started her on prandial insulin 8 units with each meal, we continued metformin 750 mg XR twice a day with meals.  Today (10/04/2018): Julia Dickerson  She checks her blood sugars *** times daily, preprandial to breakfast and ***. The patient has *** had hypoglycemic episodes since the last clinic visit, which typically occur *** x / - most often occuring ***. The patient is *** symptomatic with these episodes, with symptoms of {symptoms; hypoglycemia:9084048}. Otherwise, the patient {HAS/HAS NOT:522402} required any recent emergency interventions for hypoglycemia and {HAS/HAS NOT:522402} had recent hospitalizations  secondary to hyper or hypoglycemic episodes.    ROS: As per HPI and as detailed below: ROS    HOME DIABETES REGIMEN:  Basaglar 28 units daily NovoLog 8 units 3 times daily q. AC Metformin 750 mg XR twice daily     METER DOWNLOAD SUMMARY: Date range evaluated: *** Fingerstick Blood Glucose Tests = *** Average Number Tests/Day = *** Overall Mean FS Glucose = *** Standard Deviation = ***  BG Ranges: Low = *** High = ***   Hypoglycemic Events/30 Days: BG < 50 = *** Episodes of symptomatic severe hypoglycemia = ***    HISTORY:  Past Medical History:  Past Medical History:  Diagnosis Date  . Anxiety   . Arthritis   . Atrial fibrillation (HCC)    a. paroxysmal, uses PRN Flecainide  . Depression   . Diabetes mellitus without complication (HCC)   . Dysrhythmia   . GERD (gastroesophageal reflux disease)   . Hypertension   . Obesity   . PCOS (polycystic ovarian syndrome)   . UTI (lower urinary tract infection)    Past Surgical History:  Past Surgical History:  Procedure Laterality Date  . DILATION AND CURETTAGE OF UTERUS  2011 and 2012   x2  . GXT - Graded Exercise Tolerance Test  07/22/2016   Exercise time 6 minutes - 7 METs. Maximum heart rate 146 BPM. - No EKG changes. No arrhythmias or chest pain.: Low risk test.  . TRANSTHORACIC ECHOCARDIOGRAM  06/2016   EF 6570% with mild LVH. Normal wall motion and diastolic function. Mild RAE. Otherwise normal.  . WISDOM TOOTH EXTRACTION      Social History:  reports that she quit smoking about  5 weeks ago. Her smoking use included cigarettes and e-cigarettes. She has a 10.50 pack-year smoking history. She has never used smokeless tobacco. She reports current alcohol use. She reports that she does not use drugs. Family History:  Family History  Problem Relation Age of Onset  . Heart failure Father   . Heart attack Father   . Cancer Father   . Diabetes Father   . Hearing loss Father   . Hyperlipidemia Father   .  Hypertension Father   . Arthritis Mother   . Diabetes Mother   . COPD Maternal Grandmother   . Heart disease Maternal Grandmother   . Hyperlipidemia Maternal Grandmother   . Hyperlipidemia Maternal Grandfather   . Hypertension Maternal Grandfather   . Hearing loss Maternal Grandfather   . Alcohol abuse Maternal Grandfather   . Arthritis Maternal Grandfather   . Stroke Maternal Grandfather   . Hyperlipidemia Paternal Grandmother   . Miscarriages / Stillbirths Paternal Grandmother   . Kidney disease Paternal Grandfather   . Hypertension Paternal Grandfather   . Heart attack Paternal Grandfather   . Diabetes Paternal Grandfather   . Arthritis Sister      HOME MEDICATIONS: Allergies as of 10/04/2018      Reactions   Sulfa Antibiotics Hives      Medication List       Accurate as of October 04, 2018  8:08 AM. Always use your most recent med list.        atorvastatin 40 MG tablet Commonly known as:  LIPITOR TAKE 1 TABLET BY MOUTH EVERY DAY   Basaglar KwikPen 100 UNIT/ML Sopn Inject 0.28 mLs (28 Units total) into the skin at bedtime.   diltiazem 120 MG 24 hr capsule Commonly known as:  CARDIZEM CD Take 1 capsule (120 mg total) by mouth as needed.   diltiazem 180 MG 24 hr capsule Commonly known as:  CARDIZEM CD Take 1 capsule (180 mg total) by mouth daily. NEED OV.   DULoxetine 30 MG capsule Commonly known as:  CYMBALTA Take 30 mg by mouth daily.   Eliquis 5 MG Tabs tablet Generic drug:  apixaban TAKE 1 TABLET (5 MG TOTAL) 2 (TWO) TIMES DAILY BY MOUTH.   flecainide 100 MG tablet Commonly known as:  TAMBOCOR Take 2 tablets (200 mg total) by mouth as needed. For A-fib   hydrocortisone cream 1 % Apply 1 application topically 2 (two) times daily.   insulin lispro 100 UNIT/ML KwikPen Commonly known as:  HumaLOG KwikPen Inject 0.08 mLs (8 Units total) into the skin 3 (three) times daily.   Insulin Pen Needle 32G X 4 MM Misc Four times daily   lisinopril 10 MG  tablet Commonly known as:  PRINIVIL,ZESTRIL Take 1 tablet (10 mg total) by mouth daily.   metFORMIN 750 MG 24 hr tablet Commonly known as:  GLUCOPHAGE-XR TAKE 1 TABLET (750 MG TOTAL) BY MOUTH 2 (TWO) TIMES DAILY WITH A MEAL.   multivitamin with minerals Tabs tablet Take 1 tablet by mouth daily.   omeprazole 20 MG capsule Commonly known as:  PRILOSEC        OBJECTIVE:   Vital Signs: There were no vitals taken for this visit.  Wt Readings from Last 3 Encounters:  10/02/18 248 lb (112.5 kg)  09/05/18 248 lb (112.5 kg)  09/04/18 248 lb (112.5 kg)     Exam: General: Pt appears well and is in NAD  Hydration: Well-hydrated with moist mucous membranes and good skin turgor  HEENT: Head: Unremarkable  with good dentition. Oropharynx clear without exudate.  Eyes: External eye exam normal without stare, lid lag or exophthalmos.  EOM intact.  PERRL.  Neck: General: Supple without adenopathy. Thyroid: Thyroid size normal.  No goiter or nodules appreciated. No thyroid bruit.  Lungs: Clear with good BS bilat with no rales, rhonchi, or wheezes  Heart: RRR with normal S1 and S2 and no gallops; no murmurs; no rub  Abdomen: Normoactive bowel sounds, soft, nontender, without masses or organomegaly palpable  Extremities: No pretibial edema. No tremor. Normal strength and motion throughout. See detailed diabetic foot exam below.  Skin: Normal texture and temperature to palpation. No rash noted. No Acanthosis nigricans/skin tags. No lipohypertrophy.  Neuro: MS is good with appropriate affect, pt is alert and Ox3         DM foot exam: 09/05/18 The skin of the feet is intact without sores or ulcerations. The pedal pulses are 1+ on right and 1+ on left. The sensation is absent  to a screening 5.07, 10 gram monofilament bilaterally       DATA REVIEWED:  Lab Results  Component Value Date   HGBA1C 10.7 (H) 08/23/2018   HGBA1C 10.5 (H) 06/19/2018   HGBA1C 9.2 (H) 12/26/2017   Lab Results    Component Value Date   MICROALBUR 7.4 (H) 12/26/2017   CREATININE 0.79 07/31/2018   Lab Results  Component Value Date   MICRALBCREAT 6.8 12/26/2017     Lab Results  Component Value Date   CHOL 204 (H) 08/22/2017   HDL 27.60 (L) 08/22/2017   LDLDIRECT 140.0 08/22/2017   TRIG 302.0 (H) 08/22/2017   CHOLHDL 7 08/22/2017         ASSESSMENT / PLAN / RECOMMENDATIONS:   1) Type 2 Diabetes Mellitus, Poorly controlled, With neuropathic complications - Most recent A1c of 10.7 %. Goal A1c < 7.0 %.    Plan: MEDICATIONS:  ***  EDUCATION / INSTRUCTIONS:  BG monitoring instructions: Patient is instructed to check her blood sugars *** times a day, ***.  Call [facility] if: BG persistently < 70 or > 300. . I reviewed the Rule of 15 for the treatment of hypoglycemia in detail with the patient. Literature supplied.     F/U in ***    Signed electronically by: Lyndle Herrlich, MD  [facility]  [facility] [address] [place] [place], [place] [address] Phone: [phone] FAX: [phone]   CC: Deeann Saint, MD [address] [place] [address] Phone: [phone]  Fax: [phone]  Return to Endocrinology clinic as below: Future Appointments  Date Time Provider Department Center  10/04/2018 10:10 AM Shamleffer, Konrad Dolores, MD [facility] None  10/05/2018 10:30 AM Parrett, Virgel Bouquet, NP [facility] None  11/10/2018  3:00 PM Genia Del, MD [facility]  11/27/2018 10:00 AM Derrell Lolling Rolin Barry, RD [facility] NDM

## 2018-10-05 ENCOUNTER — Ambulatory Visit (INDEPENDENT_AMBULATORY_CARE_PROVIDER_SITE_OTHER): Payer: 59 | Admitting: Adult Health

## 2018-10-05 ENCOUNTER — Encounter: Payer: Self-pay | Admitting: Adult Health

## 2018-10-05 ENCOUNTER — Other Ambulatory Visit: Payer: Self-pay

## 2018-10-05 DIAGNOSIS — R634 Abnormal weight loss: Secondary | ICD-10-CM

## 2018-10-05 DIAGNOSIS — G4733 Obstructive sleep apnea (adult) (pediatric): Secondary | ICD-10-CM | POA: Diagnosis not present

## 2018-10-05 NOTE — Assessment & Plan Note (Signed)
Healthy weight loss 

## 2018-10-05 NOTE — Assessment & Plan Note (Signed)
Mild OSA  Multiple co-morbidities   Plan  . Patient Instructions  Begin CPAP at bedtime Goal was to wear for at least 4 to 6 hours each night Healthy weight loss Do not drive if sleepy Follow-up in 2 months with Dr. Craige Cotta  Or Leman Martinek NP and As needed

## 2018-10-05 NOTE — Patient Instructions (Signed)
Begin CPAP at bedtime Goal was to wear for at least 4 to 6 hours each night Healthy weight loss Do not drive if sleepy Follow-up in 2 months with Dr. Craige Cotta  Or Holston Oyama NP and As needed

## 2018-10-05 NOTE — Addendum Note (Signed)
Addended by: Boone Master E on: 10/05/2018 10:56 AM   Modules accepted: Orders

## 2018-10-05 NOTE — Progress Notes (Signed)
@Patient  ID: Julia Dickerson, female    DOB: 06-08-1976, 43 y.o.   MRN: [id number]  Chief Complaint  Patient presents with   Follow-up    OSA     Referring provider: Deeann Saint, MD  HPI: 43 year old female seen for sleep consult August 28, 2018 found to have mild sleep apnea Medical history significant for polycystic ovarian syndrome, hypertension, A. fib and diabetes. TEST/EVENTS :  Echo 07/15/16 >> mild LVH, EF 65 to 70%  10/05/2018 Follow up : OSA  Patient returns for a one-month follow-up.  Patient was seen last visit for a LEEP consult.  Patient has been having difficulty with sleep, daytime sleepiness.  Patient was set up for a home sleep study that was done on September 26, 2018 that showed mild sleep apnea with AHI at 9.5, SPO2 low 79%.  We discussed her sleep study results.  Patient education on sleep apnea and potential complications.  We went over treatment options including weight loss, oral appliance, CPAP.  Patient would like to proceed with CPAP .     Allergies  Allergen Reactions   Sulfa Antibiotics Hives    Immunization History  Administered Date(s) Administered   Influenza Split 03/28/2018    Past Medical History:  Diagnosis Date   Anxiety    Arthritis    Atrial fibrillation (HCC)    a. paroxysmal, uses PRN Flecainide   Depression    Diabetes mellitus without complication (HCC)    Dysrhythmia    GERD (gastroesophageal reflux disease)    Hypertension    Obesity    PCOS (polycystic ovarian syndrome)    UTI (lower urinary tract infection)     Tobacco History: Social History   Tobacco Use  Smoking Status Former Smoker   Packs/day: 0.50   Years: 21.00   Pack years: 10.50   Types: Cigarettes, E-cigarettes   Last attempt to quit: 08/24/2018   Years since quitting: 0.1  Smokeless Tobacco Never Used  Tobacco Comment   vapor   Counseling given: Not Answered Comment: vapor   Outpatient Medications Prior to Visit    Medication Sig Dispense Refill   atorvastatin (LIPITOR) 40 MG tablet TAKE 1 TABLET BY MOUTH EVERY DAY 90 tablet 2   diltiazem (CARDIZEM CD) 120 MG 24 hr capsule Take 1 capsule (120 mg total) by mouth as needed. 90 capsule 1   diltiazem (CARDIZEM CD) 180 MG 24 hr capsule Take 1 capsule (180 mg total) by mouth daily. NEED OV. 90 capsule 0   DULoxetine (CYMBALTA) 30 MG capsule Take 30 mg by mouth daily.     ELIQUIS 5 MG TABS tablet TAKE 1 TABLET (5 MG TOTAL) 2 (TWO) TIMES DAILY BY MOUTH. 180 tablet 1   flecainide (TAMBOCOR) 100 MG tablet Take 2 tablets (200 mg total) by mouth as needed. For A-fib 10 tablet 6   hydrocortisone cream 1 % Apply 1 application topically 2 (two) times daily. 30 g 0   Insulin Glargine (BASAGLAR KWIKPEN) 100 UNIT/ML SOPN Inject 0.28 mLs (28 Units total) into the skin at bedtime. 15 pen 1   insulin lispro (HUMALOG KWIKPEN) 100 UNIT/ML KwikPen Inject 0.08 mLs (8 Units total) into the skin 3 (three) times daily. 15 mL 11   Insulin Pen Needle 32G X 4 MM MISC Four times daily 150 each 3   lisinopril (PRINIVIL,ZESTRIL) 10 MG tablet Take 1 tablet (10 mg total) by mouth daily. 90 tablet 2   metFORMIN (GLUCOPHAGE-XR) 750 MG 24 hr tablet TAKE 1 TABLET (  750 MG TOTAL) BY MOUTH 2 (TWO) TIMES DAILY WITH A MEAL. 180 tablet 1   Multiple Vitamin (MULTIVITAMIN WITH MINERALS) TABS tablet Take 1 tablet by mouth daily.     omeprazole (PRILOSEC) 20 MG capsule      No facility-administered medications prior to visit.      Review of Systems:   Constitutional:   No  weight loss, night sweats,  Fevers, chills,  +fatigue, or  lassitude.  HEENT:   No headaches,  Difficulty swallowing,  Tooth/dental problems, or  Sore throat,                No sneezing, itching, ear ache, nasal congestion, post nasal drip,   CV:  No chest pain,  Orthopnea, PND, swelling in lower extremities, anasarca, dizziness, palpitations, syncope.   GI  No heartburn, indigestion, abdominal pain, nausea,  vomiting, diarrhea, change in bowel habits, loss of appetite, bloody stools.   Resp: No shortness of breath with exertion or at rest.  No excess mucus, no productive cough,  No non-productive cough,  No coughing up of blood.  No change in color of mucus.  No wheezing.  No chest wall deformity  Skin: no rash or lesions.  GU: no dysuria, change in color of urine, no urgency or frequency.  No flank pain, no hematuria   MS:  No joint pain or swelling.  No decreased range of motion.  No back pain.    Physical Exam  BP 124/76 (BP Location: Left Arm, Cuff Size: Large)    Pulse 99    Ht 5\' 8"  (1.727 m)    Wt 253 lb 12.8 oz (115.1 kg)    SpO2 98%    BMI 38.59 kg/m   GEN: A/Ox3; pleasant , NAD, obese    HEENT:  [place]/AT,  EACs-clear, TMs-wnl, NOSE-clear, THROAT-clear, no lesions, no postnasal drip or exudate noted. Class 2 MP airway   NECK:  Supple w/ fair ROM; no JVD; normal carotid impulses w/o bruits; no thyromegaly or nodules palpated; no lymphadenopathy.    RESP  Clear  P & A; w/o, wheezes/ rales/ or rhonchi. no accessory muscle use, no dullness to percussion  CARD:  RRR, no m/r/g, no peripheral edema, pulses intact, no cyanosis or clubbing.  GI:   Soft & nt; nml bowel sounds; no organomegaly or masses detected.   Musco: Warm bil, no deformities or joint swelling noted.   Neuro: alert, no focal deficits noted.    Skin: Warm, no lesions or rashes    Lab Results:  CBC  Imaging:     No flowsheet data found.  No results found for: NITRICOXIDE      Assessment & Plan:   OSA (obstructive sleep apnea) Mild OSA  Multiple co-morbidities   Plan  . Patient Instructions  Begin CPAP at bedtime Goal was to wear for at least 4 to 6 hours each night Healthy weight loss Do not drive if sleepy Follow-up in 2 months with Dr. Craige Cotta  Or Lavaris Sexson NP and As needed      Weight loss Healthy weight loss      Rubye Oaks, NP 10/05/2018

## 2018-10-10 ENCOUNTER — Other Ambulatory Visit: Payer: Self-pay

## 2018-10-10 DIAGNOSIS — M545 Low back pain: Secondary | ICD-10-CM | POA: Diagnosis not present

## 2018-10-10 DIAGNOSIS — I480 Paroxysmal atrial fibrillation: Secondary | ICD-10-CM

## 2018-10-10 DIAGNOSIS — M48062 Spinal stenosis, lumbar region with neurogenic claudication: Secondary | ICD-10-CM | POA: Diagnosis not present

## 2018-10-10 DIAGNOSIS — M2560 Stiffness of unspecified joint, not elsewhere classified: Secondary | ICD-10-CM | POA: Diagnosis not present

## 2018-10-10 MED ORDER — DILTIAZEM HCL ER COATED BEADS 180 MG PO CP24: 180.0000 mg | ORAL_CAPSULE | Freq: Every day | ORAL | 0 refills | Status: DC

## 2018-10-12 ENCOUNTER — Ambulatory Visit: Payer: 59 | Admitting: Internal Medicine

## 2018-10-12 ENCOUNTER — Other Ambulatory Visit: Payer: Self-pay

## 2018-10-12 ENCOUNTER — Encounter: Payer: Self-pay | Admitting: Internal Medicine

## 2018-10-12 VITALS — BP 142/92 | HR 97 | Temp 98.2°F | Ht 68.0 in | Wt 250.8 lb

## 2018-10-12 DIAGNOSIS — G4733 Obstructive sleep apnea (adult) (pediatric): Secondary | ICD-10-CM | POA: Diagnosis not present

## 2018-10-12 DIAGNOSIS — E1140 Type 2 diabetes mellitus with diabetic neuropathy, unspecified: Secondary | ICD-10-CM | POA: Diagnosis not present

## 2018-10-12 MED ORDER — INSULIN LISPRO (1 UNIT DIAL) 100 UNIT/ML (KWIKPEN): 10.0000 [IU] | PEN_INJECTOR | Freq: Three times a day (TID) | SUBCUTANEOUS | 11 refills | Status: DC

## 2018-10-12 MED ORDER — SEMAGLUTIDE 7 MG PO TABS: 7.0000 mg | ORAL_TABLET | Freq: Every day | ORAL | 1 refills | Status: DC

## 2018-10-12 MED ORDER — SEMAGLUTIDE 7 MG PO TABS: 7.0000 mg | ORAL_TABLET | Freq: Every day | ORAL | 6 refills | Status: DC

## 2018-10-12 MED ORDER — BASAGLAR KWIKPEN 100 UNIT/ML ~~LOC~~ SOPN: 32.0000 [IU] | PEN_INJECTOR | Freq: Every day | SUBCUTANEOUS | 3 refills | Status: DC

## 2018-10-12 NOTE — Progress Notes (Signed)
Name: Julia Dickerson  Age/ Sex: 43 y.o., female   MRN/ DOB: [id number], August 25, 1975     PCP: Deeann Saint, MD   Reason for Endocrinology Evaluation: Type 2 diabetes Mellitus  Initial Endocrine Consultative Visit:  09/05/2018    PATIENT IDENTIFIER: [name] is a 43 y.o. female with a past medical history of PCOS, T2DM, PAF and HTN.  The patient has followed with Endocrinology clinic since 09/05/2018 for consultative assistance with management of her diabetes.  DIABETIC HISTORY:  [name] was diagnosed with T2DM since the 2000's.  She was initially on metformin, she was switched to MDI insulin regimen which helped control her hyperglycemia at the time, the patient was subsequently switched to oral glycemic agents in 2012 due to improved glucose control.  She was restarted on basal insulin in 2019 due to persistent hyperglycemia, she has tried Victoza in the past with severe nausea. Her hemoglobin A1c has ranged from 8.7% in in 2019, peaking at 10.7% in 08/14/2018.   Prandial insulin started in February 2020, in addition to continuing her metformin and basal insulin.  SUBJECTIVE:   During the last visit (09/05/2018): Her A1c was 10.7% at the time.  We increase Basaglar to 28 units daily, we started her on prandial insulin 8 units with each meal, we continued metformin 750 mg XR twice a day with meals.  Today (10/12/2018): Ms. Baloun is here for a 4 week follow up on diabetes management.  She checks her blood sugars 3-4  times daily, preprandial  and bedtime The patient has not had hypoglycemic episodes since the last clinic visit. Otherwise, the patient has not required any recent emergency interventions for hypoglycemia and has not had recent hospitalizations secondary to hyper or hypoglycemic episodes.    ROS: As per HPI and as detailed below: Review of Systems  Constitutional: Negative for fever  and weight loss.  HENT: Negative for congestion and sore throat.   Gastrointestinal: Negative for diarrhea and nausea.  Genitourinary: Negative for frequency.  Endo/Heme/Allergies: Negative for polydipsia.      HOME DIABETES REGIMEN:  Basaglar 28 units daily NovoLog 8 units 3 times daily q. AC Metformin 750 mg XR twice daily  DIABETIC COMPLICATIONS: Microvascular complications:   Neuropathy   Denies: CKD, retinopathy   Last eye exam: Completed 09/15/2018  Macrovascular complications:    Denies: CAD, PVD, CVA   METER DOWNLOAD SUMMARY:   BG > 250 mg/dL     HISTORY:  Past Medical History:  Past Medical History:  Diagnosis Date  . Anxiety   . Arthritis   . Atrial fibrillation (HCC)    a. paroxysmal, uses PRN Flecainide  . Depression   . Diabetes mellitus without complication (HCC)   . Dysrhythmia   . GERD (gastroesophageal reflux disease)   . Hypertension   . Obesity   . PCOS (polycystic ovarian syndrome)   . UTI (lower urinary tract infection)    Past Surgical History:  Past Surgical History:  Procedure Laterality Date  . DILATION AND CURETTAGE OF UTERUS  2011 and 2012   x2  . GXT - Graded Exercise Tolerance Test  07/22/2016   Exercise time 6 minutes - 7 METs. Maximum heart rate 146 BPM. - No EKG changes. No arrhythmias or chest pain.: Low risk test.  . TRANSTHORACIC ECHOCARDIOGRAM  06/2016   EF 6570% with mild LVH. Normal wall motion and diastolic function. Mild RAE. Otherwise normal.  . WISDOM TOOTH EXTRACTION      Social  History:  reports that she quit smoking about 7 weeks ago. Her smoking use included cigarettes and e-cigarettes. She has a 10.50 pack-year smoking history. She has never used smokeless tobacco. She reports current alcohol use. She reports that she does not use drugs. Family History:  Family History  Problem Relation Age of Onset  . Heart failure Father   . Heart attack Father   . Cancer Father   . Diabetes Father   . Hearing  loss Father   . Hyperlipidemia Father   . Hypertension Father   . Arthritis Mother   . Diabetes Mother   . COPD Maternal Grandmother   . Heart disease Maternal Grandmother   . Hyperlipidemia Maternal Grandmother   . Hyperlipidemia Maternal Grandfather   . Hypertension Maternal Grandfather   . Hearing loss Maternal Grandfather   . Alcohol abuse Maternal Grandfather   . Arthritis Maternal Grandfather   . Stroke Maternal Grandfather   . Hyperlipidemia Paternal Grandmother   . Miscarriages / Stillbirths Paternal Grandmother   . Kidney disease Paternal Grandfather   . Hypertension Paternal Grandfather   . Heart attack Paternal Grandfather   . Diabetes Paternal Grandfather   . Arthritis Sister      HOME MEDICATIONS: Allergies as of 10/12/2018      Reactions   Sulfa Antibiotics Hives      Medication List       Accurate as of October 12, 2018 11:50 AM. Always use your most recent med list.        atorvastatin 40 MG tablet Commonly known as:  LIPITOR TAKE 1 TABLET BY MOUTH EVERY DAY   Basaglar KwikPen 100 UNIT/ML Sopn Inject 0.32 mLs (32 Units total) into the skin at bedtime.   diltiazem 120 MG 24 hr capsule Commonly known as:  CARDIZEM CD Take 1 capsule (120 mg total) by mouth as needed.   diltiazem 180 MG 24 hr capsule Commonly known as:  CARDIZEM CD Take 1 capsule (180 mg total) by mouth daily. NEED OV.   DULoxetine 30 MG capsule Commonly known as:  CYMBALTA Take 30 mg by mouth daily.   Eliquis 5 MG Tabs tablet Generic drug:  apixaban TAKE 1 TABLET (5 MG TOTAL) 2 (TWO) TIMES DAILY BY MOUTH.   flecainide 100 MG tablet Commonly known as:  TAMBOCOR Take 2 tablets (200 mg total) by mouth as needed. For A-fib   hydrocortisone cream 1 % Apply 1 application topically 2 (two) times daily.   insulin lispro 100 UNIT/ML KwikPen Commonly known as:  HumaLOG KwikPen Inject 0.1 mLs (10 Units total) into the skin 3 (three) times daily. Max daily dose of 36 units    Insulin Pen Needle 32G X 4 MM Misc Four times daily   lisinopril 10 MG tablet Commonly known as:  PRINIVIL,ZESTRIL Take 1 tablet (10 mg total) by mouth daily.   metFORMIN 750 MG 24 hr tablet Commonly known as:  GLUCOPHAGE-XR TAKE 1 TABLET (750 MG TOTAL) BY MOUTH 2 (TWO) TIMES DAILY WITH A MEAL.   multivitamin with minerals Tabs tablet Take 1 tablet by mouth daily.   omeprazole 20 MG capsule Commonly known as:  PRILOSEC   Semaglutide 7 MG Tabs Commonly known as:  Rybelsus Take 7 mg by mouth daily before breakfast.        OBJECTIVE:   Vital Signs: BP (!) 142/92 (BP Location: Left Arm, Patient Position: Sitting, Cuff Size: Normal)   Pulse 97   Temp 98.2 F (36.8 C)   Ht  (  1.727 m)   Wt 250 lb 12.8 oz (113.8 kg)   SpO2 98%   BMI 38.13 kg/m   Wt Readings from Last 3 Encounters:  10/12/18 250 lb 12.8 oz (113.8 kg)  10/05/18 253 lb 12.8 oz (115.1 kg)  10/02/18 248 lb (112.5 kg)     Exam: General: Pt appears well and is in NAD  Lungs: Clear with good BS bilat with no rales, rhonchi, or wheezes  Heart: RRR with normal S1 and S2 and no gallops; no murmurs; no rub  Abdomen: Normoactive bowel sounds, soft, nontender, without masses or organomegaly palpable  Extremities: No pretibial edema. No tremor. Normal strength and motion throughout. See detailed diabetic foot exam below.  Skin: Normal texture and temperature to palpation. No rash noted.  Neuro: MS is good with appropriate affect, pt is alert and Ox3        DM foot exam: 09/05/18 The skin of the feet is intact without sores or ulcerations. The pedal pulses are 1+ on right and 1+ on left. The sensation is absent  to a screening 5.07, 10 gram monofilament bilaterally       DATA REVIEWED:  Lab Results  Component Value Date   HGBA1C 10.7 (H) 08/23/2018   HGBA1C 10.5 (H) 06/19/2018   HGBA1C 9.2 (H) 12/26/2017   Lab Results  Component Value Date   MICROALBUR 7.4 (H) 12/26/2017   CREATININE 0.79  07/31/2018   Lab Results  Component Value Date   MICRALBCREAT 6.8 12/26/2017     Lab Results  Component Value Date   CHOL 204 (H) 08/22/2017   HDL 27.60 (L) 08/22/2017   LDLDIRECT 140.0 08/22/2017   TRIG 302.0 (H) 08/22/2017   CHOLHDL 7 08/22/2017         ASSESSMENT / PLAN / RECOMMENDATIONS:   1) Type 2 Diabetes Mellitus, Poorly controlled, With neuropathic complications - Most recent A1c of 10.7 %. Goal A1c < 7.0 %.    - Pt continues with hyperglycemia all day and through the night, will increase her insulin regimen as below - We have discussed adding an insulin sensitizer to help with glucose control and weight loss and to hopefully be able to at least keep her on the basal insulin.  - She is intolerant to victoza in the past due to nausea. We discussed trying another GLP-1 agonist , we also discussed adding an SGLT-2 inhibitor but she is concerned about yeast infections at this time.  - We discussed Rybelsus vs Ozempic it seems they are both teir 2 per her insurance company. Will start with Rybelsus first, she was provided with 30 day supply , she was provided instruction and a co-pay card.  - She has no personal hx of pancreatitis or medullary thyroid cancer, no FH of medullary cancer either.  - She was advised to contact us should her BG's start running below 100 mg.dL to adjust her insulin and prevent hypoglycemia.     MEDICATIONS:  INcrease Basaglar to 32 units daily  Increase Novolog to 10 units , If pre-meal BG is > 200 add 2 units of novolog  Continue Metformin 750 mg XR BID  Start Rybelsus 3 mg daily for 30 days, then increase to 7 mg   EDUCATION / INSTRUCTIONS:  BG monitoring instructions: Patient is instructed to check her blood sugars 4 times a day, fasting and bedtime.  Call [facility] if: BG persistently < 70 or > 300. . I reviewed the Rule of 15 for the treatment of  hypoglycemia in detail with the patient. Literature supplied.      F/U in 8 weeks     Signed electronically by: Lyndle Herrlich, MD  [facility]  [facility] [address] [place], [place] [address] Phone: [phone] FAX: [phone]   CC: Deeann Saint, MD [address] [place] [address] Phone: [phone]  Fax: [phone]  Return to Endocrinology clinic as below: Future Appointments  Date Time Provider Department Center  11/10/2018  3:00 PM Genia Del, MD [facility]  11/27/2018 10:00 AM Bonnita Levan, RD [facility]  12/12/2018 10:10 AM Leny Morozov, Konrad Dolores, MD [facility] None

## 2018-10-12 NOTE — Patient Instructions (Addendum)
-   Increase Basaglar to 32 units  - Increase Novolog to 10 units with each meals - If "before meal" glucose is 200 Or higher, please add 2 units of novolog to that meal  - Continue Metformin Twice a day  - start Rybelsus at 3 mg once daily for 30 days, then increase to 7 mg daily   - Please contact us if your sugars are running below 100 mg /dL      HOW TO TREAT LOW BLOOD SUGARS (Blood sugar LESS THAN 70 MG/DL)  Please follow the RULE OF 15 for the treatment of hypoglycemia treatment (when your (blood sugars are less than 70 mg/dL)    STEP 1: Take 15 grams of carbohydrates when your blood sugar is low, which includes:   3-4 GLUCOSE TABS  OR  3-4 OZ OF JUICE OR REGULAR SODA OR  ONE TUBE OF GLUCOSE GEL     STEP 2: RECHECK blood sugar in 15 MINUTES STEP 3: If your blood sugar is still low at the 15 minute recheck --> then, go back to STEP 1 and treat AGAIN with another 15 grams of carbohydrates.

## 2018-10-17 DIAGNOSIS — M2560 Stiffness of unspecified joint, not elsewhere classified: Secondary | ICD-10-CM | POA: Diagnosis not present

## 2018-10-17 DIAGNOSIS — M545 Low back pain: Secondary | ICD-10-CM | POA: Diagnosis not present

## 2018-10-17 DIAGNOSIS — M48062 Spinal stenosis, lumbar region with neurogenic claudication: Secondary | ICD-10-CM | POA: Diagnosis not present

## 2018-11-08 ENCOUNTER — Other Ambulatory Visit: Payer: Self-pay

## 2018-11-10 ENCOUNTER — Encounter: Payer: Self-pay | Admitting: Obstetrics & Gynecology

## 2018-11-10 ENCOUNTER — Other Ambulatory Visit: Payer: Self-pay

## 2018-11-10 ENCOUNTER — Other Ambulatory Visit: Payer: Self-pay | Admitting: Family Medicine

## 2018-11-10 ENCOUNTER — Ambulatory Visit: Payer: 59 | Admitting: Obstetrics & Gynecology

## 2018-11-10 VITALS — BP 132/84 | Ht 66.5 in | Wt 253.0 lb

## 2018-11-10 DIAGNOSIS — Z1151 Encounter for screening for human papillomavirus (HPV): Secondary | ICD-10-CM

## 2018-11-10 DIAGNOSIS — Z6841 Body Mass Index (BMI) 40.0 and over, adult: Secondary | ICD-10-CM | POA: Diagnosis not present

## 2018-11-10 DIAGNOSIS — N951 Menopausal and female climacteric states: Secondary | ICD-10-CM | POA: Diagnosis not present

## 2018-11-10 DIAGNOSIS — B373 Candidiasis of vulva and vagina: Secondary | ICD-10-CM

## 2018-11-10 DIAGNOSIS — R87618 Other abnormal cytological findings on specimens from cervix uteri: Secondary | ICD-10-CM | POA: Diagnosis not present

## 2018-11-10 DIAGNOSIS — E66813 Obesity, class 3: Secondary | ICD-10-CM

## 2018-11-10 DIAGNOSIS — Z01419 Encounter for gynecological examination (general) (routine) without abnormal findings: Secondary | ICD-10-CM | POA: Diagnosis not present

## 2018-11-10 DIAGNOSIS — B3731 Acute candidiasis of vulva and vagina: Secondary | ICD-10-CM

## 2018-11-10 DIAGNOSIS — E1140 Type 2 diabetes mellitus with diabetic neuropathy, unspecified: Secondary | ICD-10-CM

## 2018-11-10 LAB — WET PREP FOR TRICH, YEAST, CLUE

## 2018-11-10 MED ORDER — TERCONAZOLE 0.8 % VA CREA: 1.0000 | TOPICAL_CREAM | Freq: Every day | VAGINAL | 3 refills | Status: AC

## 2018-11-10 MED ORDER — FLUCONAZOLE 150 MG PO TABS: 150.0000 mg | ORAL_TABLET | Freq: Every day | ORAL | 0 refills | Status: DC

## 2018-11-10 NOTE — Patient Instructions (Addendum)
1. Encounter for routine gynecological examination with Papanicolaou smear of cervix Normal gynecologic exam in perimenopause.  Pap/HPV HR done today.  Breast exam normal.  [facility]  Health labs with Fam MD.  Stopped smoking 07/2018.  Many medical conditions under treatment.  2. Perimenopause No menses x 06/2018.  Probably entering menopause.  FSH 38.1 on 08/23/2018.  Recommend Vit D supplements, Ca++ intake of 1200-1500 mg daily, regular weight bearing physical activities.  3. Yeast vaginitis Recurrent yeast vaginitis.  Current yeast vaginitis clinically and confirmed by wet prep.  Patient will stop her statin during fluconazole treatment with 1 tablet of 150 mg daily for 3 days.  Will then treat with terconazole 0.8% intravaginal application 3 nights in a row.  Refills of terconazole cream provided.  Probiotic tablet vaginally weekly recommended for prevention.  4. Class 3 severe obesity due to excess calories with serious comorbidity and body mass index (BMI) of 40.0 to 44.9 in adult [facility]) Recommend a lower calorie/carb diet, patient has started a diabetes mellitus nutrition plan.  Recommend increasing aerobic activities to 5 times a week with weightlifting every 2 days.  Other orders - fluconazole (DIFLUCAN) 150 MG tablet; Take 1 tablet (150 mg total) by mouth daily for 3 days. - terconazole (TERAZOL 3) 0.8 % vaginal cream; Place 1 applicator vaginally at bedtime for 3 days.  Shaula, it was a pleasure seeing you today!  I will inform you of your results as soon as they are available.

## 2018-11-10 NOTE — Progress Notes (Signed)
Julia Dickerson 1975-10-04 [id number]   History:    43 y.o.  G2P0A2 Married  RP:  Established patient presenting for annual gyn exam   HPI: Perimenopause with no menses x 06/2018.  No pelvic pain.  No pain with IC.  C/O increased vaginal discharge with itching.  Treated with Fluconazole x 2 days a few weeks ago and last week with Monistat.  Less symptomatic since the Monistat treatment.  Urine/BMs normal.  Breasts normal.  No screening mammo yet.  BMI 40.22.  Not exercising regularly.  On a DM diet.  Type 2 DM on Insulin and Metformin.  Stopped cigarette smoking and vaping 07/2018.  cHTN and Hypercholesterol under treatment as well.  Health labs with Fam MD.    Past medical history,surgical history, family history and social history were all reviewed and documented in the EPIC chart.  Gynecologic History No LMP recorded. Patient is perimenopausal. Contraception: H/O Infertility.  Perimenopause. Last Pap: 08/2013. Results were: Negative Last mammogram: Never Bone Density: Never Colonoscopy: Never  Obstetric History OB History  Gravida Para Term Preterm AB Living  2       2    SAB TAB Ectopic Multiple Live Births               # Outcome Date GA Lbr Len/2nd Weight Sex Delivery Anes PTL Lv  2 AB           1 AB              ROS: A ROS was performed and pertinent positives and negatives are included in the history.  GENERAL: No fevers or chills. HEENT: No change in vision, no earache, sore throat or sinus congestion. NECK: No pain or stiffness. CARDIOVASCULAR: No chest pain or pressure. No palpitations. PULMONARY: No shortness of breath, cough or wheeze. GASTROINTESTINAL: No abdominal pain, nausea, vomiting or diarrhea, melena or bright red blood per rectum. GENITOURINARY: No urinary frequency, urgency, hesitancy or dysuria. MUSCULOSKELETAL: No joint or muscle pain, no back pain, no recent trauma. DERMATOLOGIC: No rash, no itching, no lesions. ENDOCRINE: No polyuria, polydipsia, no heat  or cold intolerance. No recent change in weight. HEMATOLOGICAL: No anemia or easy bruising or bleeding. NEUROLOGIC: No headache, seizures, numbness, tingling or weakness. PSYCHIATRIC: No depression, no loss of interest in normal activity or change in sleep pattern.     Exam:   BP 132/84   Ht 5' 6.5" (1.689 m)   Wt 253 lb (114.8 kg)   BMI 40.22 kg/m   Body mass index is 40.22 kg/m.  General appearance : Well developed well nourished female. No acute distress HEENT: Eyes: no retinal hemorrhage or exudates,  Neck supple, trachea midline, no carotid bruits, no thyroidmegaly Lungs: Clear to auscultation, no rhonchi or wheezes, or rib retractions  Heart: Regular rate and rhythm, no murmurs or gallops Breast:Examined in sitting and supine position were symmetrical in appearance, no palpable masses or tenderness,  no skin retraction, no nipple inversion, no nipple discharge, no skin discoloration, no axillary or supraclavicular lymphadenopathy Abdomen: no palpable masses or tenderness, no rebound or guarding Extremities: no edema or skin discoloration or tenderness  Pelvic: Vulva: Normal             Vagina: No gross lesions.  Increased vaginal discharge.  Wet prep done.  Cervix: No gross lesions or discharge.  Pap/HPV HR done.  Uterus  AV, normal size, shape and consistency, non-tender and mobile  Adnexa  Without masses or tenderness  Anus:  Normal  Wet prep:  Yeasts present   Assessment/Plan:  43 y.o. female for annual exam   1. Encounter for routine gynecological examination with Papanicolaou smear of cervix Normal gynecologic exam in perimenopause.  Pap/HPV HR done today.  Breast exam normal.  [facility]  Health labs with Fam MD.  Stopped smoking 07/2018.  Many medical conditions under treatment.  2. Perimenopause No menses x 06/2018.  Probably entering menopause.  FSH 38.1 on 08/23/2018.  Recommend Vit D supplements, Ca++ intake of  1200-1500 mg daily, regular weight bearing physical activities.  3. Yeast vaginitis Recurrent yeast vaginitis.  Current yeast vaginitis clinically and confirmed by wet prep.  Patient will stop her statin during fluconazole treatment with 1 tablet of 150 mg daily for 3 days.  Will then treat with terconazole 0.8% intravaginal application 3 nights in a row.  Refills of terconazole cream provided.  Probiotic tablet vaginally weekly recommended for prevention.  4. Class 3 severe obesity due to excess calories with serious comorbidity and body mass index (BMI) of 40.0 to 44.9 in adult [facility]) Recommend a lower calorie/carb diet, patient has started a diabetes mellitus nutrition plan.  Recommend increasing aerobic activities to 5 times a week with weightlifting every 2 days.  Other orders - fluconazole (DIFLUCAN) 150 MG tablet; Take 1 tablet (150 mg total) by mouth daily for 3 days. - terconazole (TERAZOL 3) 0.8 % vaginal cream; Place 1 applicator vaginally at bedtime for 3 days.  Counseling on above issues and coordination of care more than 50% for 10 minutes.  Genia DelMarie-Lyne Tanae Petrosky MD, 10:30 AM 11/10/2018

## 2018-11-12 DIAGNOSIS — G4733 Obstructive sleep apnea (adult) (pediatric): Secondary | ICD-10-CM | POA: Diagnosis not present

## 2018-11-13 LAB — PAP, TP IMAGING W/ HPV RNA, RFLX HPV TYPE 16,18/45: HPV DNA High Risk: NOT DETECTED

## 2018-11-14 ENCOUNTER — Encounter: Payer: Self-pay | Admitting: *Deleted

## 2018-11-14 ENCOUNTER — Other Ambulatory Visit: Payer: Self-pay | Admitting: Family Medicine

## 2018-11-14 ENCOUNTER — Other Ambulatory Visit: Payer: Self-pay | Admitting: *Deleted

## 2018-11-14 DIAGNOSIS — I10 Essential (primary) hypertension: Secondary | ICD-10-CM

## 2018-11-14 MED ORDER — LISINOPRIL 10 MG PO TABS: 10.0000 mg | ORAL_TABLET | Freq: Every day | ORAL | 0 refills | Status: DC

## 2018-11-27 ENCOUNTER — Ambulatory Visit: Payer: Self-pay | Admitting: Dietician

## 2018-12-01 ENCOUNTER — Encounter: Payer: Self-pay | Admitting: Internal Medicine

## 2018-12-12 ENCOUNTER — Other Ambulatory Visit: Payer: Self-pay

## 2018-12-12 ENCOUNTER — Ambulatory Visit (INDEPENDENT_AMBULATORY_CARE_PROVIDER_SITE_OTHER): Payer: 59 | Admitting: Internal Medicine

## 2018-12-12 ENCOUNTER — Encounter: Payer: Self-pay | Admitting: Internal Medicine

## 2018-12-12 DIAGNOSIS — Z794 Long term (current) use of insulin: Secondary | ICD-10-CM | POA: Diagnosis not present

## 2018-12-12 DIAGNOSIS — E1140 Type 2 diabetes mellitus with diabetic neuropathy, unspecified: Secondary | ICD-10-CM | POA: Diagnosis not present

## 2018-12-12 DIAGNOSIS — G4733 Obstructive sleep apnea (adult) (pediatric): Secondary | ICD-10-CM | POA: Diagnosis not present

## 2018-12-12 MED ORDER — INSULIN GLARGINE 100 UNIT/ML SOLOSTAR PEN: 32.0000 [IU] | PEN_INJECTOR | Freq: Every day | SUBCUTANEOUS | 11 refills | Status: DC

## 2018-12-12 MED ORDER — METFORMIN HCL ER 500 MG PO TB24: 1000.0000 mg | ORAL_TABLET | Freq: Two times a day (BID) | ORAL | 11 refills | Status: DC

## 2018-12-12 NOTE — Progress Notes (Signed)
Virtual Visit via Video Note  I connected with Julia Dickerson on 12/12/18 at by a 10:10 AM video enabled telemedicine application and verified that I am speaking with the correct person using two identifiers.   I discussed the limitations of evaluation and management by telemedicine and the availability of in person appointments. The patient expressed understanding and agreed to proceed.   -Location of the patient : Home  -Location of the provider : Office -The names of all persons participating in the telemedicine service : Pt and myself        Name: Julia Dickerson  Age/ Sex: 43 y.o., female   MRN/ DOB: [id number], 1975-08-08     PCP: Deeann SaintBanks, Shannon R, MD   Reason for Endocrinology Evaluation: Type 2 diabetes Mellitus  Initial Endocrine Consultative Visit:  09/05/2018    PATIENT IDENTIFIER: [name] is a 43 y.o. female with a past medical history of PCOS, T2DM, PAF and HTN.  The patient has followed with Endocrinology clinic since 09/05/2018 for consultative assistance with management of her diabetes.  DIABETIC HISTORY:  Julia Dickerson was diagnosed with T2DM since the 2000's.  She was initially on metformin, she was switched to MDI insulin regimen which helped control her hyperglycemia at the time, the patient was subsequently switched to oral glycemic agents in 2012 due to improved glucose control.  She was restarted on basal insulin in 2019 due to persistent hyperglycemia, she has tried Victoza in the past with severe nausea. Her hemoglobin A1c has ranged from 8.7% in in 2019, peaking at 10.7% in 08/14/2018.   Prandial insulin started in February 2020, in addition to continuing her metformin and basal insulin. Rybelsus was started on 09/2018  SUBJECTIVE:   During the last visit (10/12/2018):  We increase Basaglar to 32 units daily, we also increased novolog to 10 units with each meal, we continued metformin 750 mg XR twice a day with meals we also started rybelsus    Today (12/12/2018): Julia Dickerson is here for a virtual 8 week follow up on diabetes management.  Unfortunately since she has been working from home, she has lost motivation and has not been checking her sugars, nor has she been taking any of her medications except for the metformin. Her dose was recently changed to 500 mg XR BID, as her insurance was not covering the 750 mg anymore. She did notice loose stools after she eats, she has been taking Metformin on its own without meals. The patient has not had hypoglycemic episodes since the last clinic visit. Otherwise, the patient has not required any recent emergency interventions for hypoglycemia and has not had recent hospitalizations secondary to hyper or hypoglycemic episodes.   Pt noted a bluish discoloration of her right 2nd toe, the discoloration was noted 2 months ago, she denies any pain, she does not recall an injury   ROS: As per HPI and as detailed below: Review of Systems  Constitutional: Negative for fever and weight loss.  HENT: Negative for congestion and sore throat.   Gastrointestinal: Negative for diarrhea and nausea.  Genitourinary: Negative for frequency.  Endo/Heme/Allergies: Negative for polydipsia.      HOME DIABETES REGIMEN:  Basaglar 32 units daily- not taking  NovoLog 10 units 3 times daily q. AC- not taking  Metformin 500 mg XR twice daily Rybelsus 7 mg daily - not taking   DIABETIC COMPLICATIONS: Microvascular complications:   Neuropathy   Denies: CKD, retinopathy   Last eye exam: Completed 09/15/2018  Macrovascular complications:    Denies: CAD, PVD, CVA   METER DOWNLOAD SUMMARY:   BG > 250 mg/dL     HISTORY:  Past Medical History:  Past Medical History:  Diagnosis Date  . Anxiety   . Arthritis   . Atrial fibrillation (HCC)    a. paroxysmal, uses PRN Flecainide  . Depression   . Diabetes mellitus without complication (HCC)   . Dysrhythmia   . GERD (gastroesophageal reflux disease)   .  Hypertension   . Obesity   . PCOS (polycystic ovarian syndrome)   . UTI (lower urinary tract infection)    Past Surgical History:  Past Surgical History:  Procedure Laterality Date  . DILATION AND CURETTAGE OF UTERUS  2011 and 2012   x2  . GXT - Graded Exercise Tolerance Test  07/22/2016   Exercise time 6 minutes - 7 METs. Maximum heart rate 146 BPM. - No EKG changes. No arrhythmias or chest pain.: Low risk test.  . TRANSTHORACIC ECHOCARDIOGRAM  06/2016   EF 6570% with mild LVH. Normal wall motion and diastolic function. Mild RAE. Otherwise normal.  . WISDOM TOOTH EXTRACTION      Social History:  reports that she quit smoking about 3 months ago. Her smoking use included cigarettes and e-cigarettes. She has a 10.50 pack-year smoking history. She has never used smokeless tobacco. She reports current alcohol use. She reports that she does not use drugs. Family History:  Family History  Problem Relation Age of Onset  . Heart failure Father   . Heart attack Father   . Cancer Father   . Diabetes Father   . Hearing loss Father   . Hyperlipidemia Father   . Hypertension Father   . Arthritis Mother   . Diabetes Mother   . COPD Maternal Grandmother   . Heart disease Maternal Grandmother   . Hyperlipidemia Maternal Grandmother   . Hyperlipidemia Maternal Grandfather   . Hypertension Maternal Grandfather   . Hearing loss Maternal Grandfather   . Alcohol abuse Maternal Grandfather   . Arthritis Maternal Grandfather   . Stroke Maternal Grandfather   . Hyperlipidemia Paternal Grandmother   . Miscarriages / Stillbirths Paternal Grandmother   . Kidney disease Paternal Grandfather   . Hypertension Paternal Grandfather   . Heart attack Paternal Grandfather   . Diabetes Paternal Grandfather   . Arthritis Sister      HOME MEDICATIONS: Allergies as of 12/12/2018      Reactions   Sulfa Antibiotics Hives      Medication List       Accurate as of Dec 12, 2018  7:58 AM. If you have any  questions, ask your nurse or doctor.        atorvastatin 40 MG tablet Commonly known as:  LIPITOR TAKE 1 TABLET BY MOUTH EVERY DAY   Basaglar KwikPen 100 UNIT/ML Sopn Inject 0.32 mLs (32 Units total) into the skin at bedtime.   diltiazem 120 MG 24 hr capsule Commonly known as:  CARDIZEM CD Take 1 capsule (120 mg total) by mouth as needed. What changed:  reasons to take this   diltiazem 180 MG 24 hr capsule Commonly known as:  CARDIZEM CD Take 1 capsule (180 mg total) by mouth daily. NEED OV. What changed:  Another medication with the same name was changed. Make sure you understand how and when to take each.   DULoxetine 30 MG capsule Commonly known as:  CYMBALTA Take 30 mg by mouth daily.   Eliquis 5 MG  Tabs tablet Generic drug:  apixaban TAKE 1 TABLET (5 MG TOTAL) 2 (TWO) TIMES DAILY BY MOUTH.   flecainide 100 MG tablet Commonly known as:  TAMBOCOR Take 2 tablets (200 mg total) by mouth as needed. For A-fib What changed:  reasons to take this   hydrocortisone cream 1 % Apply 1 application topically 2 (two) times daily.   insulin lispro 100 UNIT/ML KwikPen Commonly known as:  HumaLOG KwikPen Inject 0.1 mLs (10 Units total) into the skin 3 (three) times daily. Max daily dose of 36 units   Insulin Pen Needle 32G X 4 MM Misc Four times daily   lisinopril 10 MG tablet Commonly known as:  ZESTRIL Take 1 tablet (10 mg total) by mouth daily.   metFORMIN 500 MG 24 hr tablet Commonly known as:  GLUCOPHAGE-XR Please specify directions, refills and quantity   multivitamin with minerals Tabs tablet Take 1 tablet by mouth daily.   omeprazole 20 MG capsule Commonly known as:  PRILOSEC   Semaglutide 7 MG Tabs Commonly known as:  Rybelsus Take 7 mg by mouth daily before breakfast.      OBJECTIVE:   EXAM: General: Pt appears well and is in NAD  Skin: Bluish discoloration of the right 2nd toe nail     DATA REVIEWED:  Lab Results  Component Value Date   HGBA1C  10.7 (H) 08/23/2018   HGBA1C 10.5 (H) 06/19/2018   HGBA1C 9.2 (H) 12/26/2017   Lab Results  Component Value Date   MICROALBUR 7.4 (H) 12/26/2017   CREATININE 0.79 07/31/2018   Lab Results  Component Value Date   MICRALBCREAT 6.8 12/26/2017     Lab Results  Component Value Date   CHOL 204 (H) 08/22/2017   HDL 27.60 (L) 08/22/2017   LDLDIRECT 140.0 08/22/2017   TRIG 302.0 (H) 08/22/2017   CHOLHDL 7 08/22/2017         ASSESSMENT / PLAN / RECOMMENDATIONS:   1) Type 2 Diabetes Mellitus, Poorly controlled, With neuropathic complications - Most recent A1c of 10.7 %. Goal A1c < 7.0 %.    - Pt has not been checking her glucose, nor has she been with compliant with medications - She needs basaglar switched to lantus due to insurance formulary - Encouraged finding a routine at home so she could get back to checking glucose and taking medications as prescribed.  - No changes at this time  - She was instructed on titrating metformin up to a total of 2 tabs BID,I have advised her to take it with a meal to alleviate some of the GI side effects  MEDICATIONS:  Lantus 32 units daily  Novolog  10 units , If pre-meal BG is > 200 add 2 units of novolog  Continue Metformin 500 mg XR 2 tabs BID   Rybelsus 3 mg daily for 30 days, then increase to 7 mg   EDUCATION / INSTRUCTIONS:  BG monitoring instructions: Patient is instructed to check her blood sugars 4 times a day, fasting and bedtime.  Call [facility] if: BG persistently < 70 or > 300. . I reviewed the Rule of 15 for the treatment of hypoglycemia in detail with the patient. Literature supplied.  2. Toe Contusion :  - She understands her toe exam is limited through the computer, it seems she had a contusion, another differential could be a nail nevus  which she understands if this the case, she will need this removed.  - No intervention at this time, will continue  to monitor.    I discussed the assessment and  treatment plan with the patient. The patient was provided an opportunity to ask questions and all were answered. The patient agreed with the plan and demonstrated an understanding of the instructions.   The patient was advised to call back or seek an in-person evaluation if the symptoms worsen or if the condition fails to improve as anticipated.   F/U in 3 months    Signed electronically by: Lyndle Herrlich, MD  [facility]  [facility] [address] [place], [place] [address] Phone: [phone] FAX: [phone]   CC: Deeann Saint, MD [address] [place] [address] Phone: [phone]  Fax: [phone]  Return to Endocrinology clinic as below: Future Appointments  Date Time Provider Department Center  12/12/2018 10:10 AM Suraj Ramdass, Konrad Dolores, MD [id number] None

## 2018-12-16 ENCOUNTER — Other Ambulatory Visit: Payer: Self-pay | Admitting: Obstetrics & Gynecology

## 2019-01-08 ENCOUNTER — Other Ambulatory Visit: Payer: Self-pay | Admitting: Student

## 2019-01-08 DIAGNOSIS — I480 Paroxysmal atrial fibrillation: Secondary | ICD-10-CM

## 2019-01-11 NOTE — Progress Notes (Signed)
Reviewed and agree with assessment/plan.   Marveline Profeta, MD [facility] 07/21/2016, 12:24 PM Pager:  [phone]  

## 2019-02-08 ENCOUNTER — Encounter (HOSPITAL_BASED_OUTPATIENT_CLINIC_OR_DEPARTMENT_OTHER): Payer: Self-pay | Admitting: Emergency Medicine

## 2019-02-08 ENCOUNTER — Ambulatory Visit (INDEPENDENT_AMBULATORY_CARE_PROVIDER_SITE_OTHER): Payer: 59 | Admitting: Family Medicine

## 2019-02-08 ENCOUNTER — Other Ambulatory Visit: Payer: Self-pay

## 2019-02-08 ENCOUNTER — Ambulatory Visit: Payer: Self-pay

## 2019-02-08 ENCOUNTER — Emergency Department (HOSPITAL_BASED_OUTPATIENT_CLINIC_OR_DEPARTMENT_OTHER)
Admission: EM | Admit: 2019-02-08 | Discharge: 2019-02-09 | Disposition: A | Discharge status: Home / Self Care | Payer: 59 | Attending: Emergency Medicine | Admitting: Emergency Medicine

## 2019-02-08 ENCOUNTER — Encounter: Payer: Self-pay | Admitting: Family Medicine

## 2019-02-08 VITALS — Temp 97.1°F

## 2019-02-08 DIAGNOSIS — R51 Headache: Secondary | ICD-10-CM | POA: Diagnosis present

## 2019-02-08 DIAGNOSIS — E1140 Type 2 diabetes mellitus with diabetic neuropathy, unspecified: Secondary | ICD-10-CM | POA: Insufficient documentation

## 2019-02-08 DIAGNOSIS — R197 Diarrhea, unspecified: Secondary | ICD-10-CM

## 2019-02-08 DIAGNOSIS — R5383 Other fatigue: Secondary | ICD-10-CM | POA: Diagnosis not present

## 2019-02-08 DIAGNOSIS — Z7901 Long term (current) use of anticoagulants: Secondary | ICD-10-CM | POA: Diagnosis not present

## 2019-02-08 DIAGNOSIS — R42 Dizziness and giddiness: Secondary | ICD-10-CM | POA: Diagnosis not present

## 2019-02-08 DIAGNOSIS — Z20828 Contact with and (suspected) exposure to other viral communicable diseases: Secondary | ICD-10-CM | POA: Insufficient documentation

## 2019-02-08 DIAGNOSIS — R739 Hyperglycemia, unspecified: Secondary | ICD-10-CM

## 2019-02-08 DIAGNOSIS — Z9119 Patient's noncompliance with other medical treatment and regimen: Secondary | ICD-10-CM | POA: Diagnosis not present

## 2019-02-08 DIAGNOSIS — R55 Syncope and collapse: Secondary | ICD-10-CM

## 2019-02-08 DIAGNOSIS — Z87891 Personal history of nicotine dependence: Secondary | ICD-10-CM | POA: Diagnosis not present

## 2019-02-08 DIAGNOSIS — Z79899 Other long term (current) drug therapy: Secondary | ICD-10-CM | POA: Insufficient documentation

## 2019-02-08 DIAGNOSIS — I10 Essential (primary) hypertension: Secondary | ICD-10-CM | POA: Insufficient documentation

## 2019-02-08 DIAGNOSIS — Z794 Long term (current) use of insulin: Secondary | ICD-10-CM | POA: Diagnosis not present

## 2019-02-08 DIAGNOSIS — I4891 Unspecified atrial fibrillation: Secondary | ICD-10-CM | POA: Insufficient documentation

## 2019-02-08 DIAGNOSIS — E1165 Type 2 diabetes mellitus with hyperglycemia: Secondary | ICD-10-CM | POA: Diagnosis not present

## 2019-02-08 LAB — CBC
HCT: 45.6 % (ref 36.0–46.0)
Hemoglobin: 15.9 g/dL — ABNORMAL HIGH (ref 12.0–15.0)
MCH: 30.6 pg (ref 26.0–34.0)
MCHC: 34.9 g/dL (ref 30.0–36.0)
MCV: 87.9 fL (ref 80.0–100.0)
Platelets: 282 10*3/uL (ref 150–400)
RBC: 5.19 MIL/uL — ABNORMAL HIGH (ref 3.87–5.11)
RDW: 13.2 % (ref 11.5–15.5)
WBC: 9.8 10*3/uL (ref 4.0–10.5)
nRBC: 0 % (ref 0.0–0.2)

## 2019-02-08 LAB — COMPREHENSIVE METABOLIC PANEL
ALT: 45 U/L — ABNORMAL HIGH (ref 0–44)
AST: 31 U/L (ref 15–41)
Albumin: 4.2 g/dL (ref 3.5–5.0)
Alkaline Phosphatase: 70 U/L (ref 38–126)
Anion gap: 16 — ABNORMAL HIGH (ref 5–15)
BUN: 9 mg/dL (ref 6–20)
CO2: 21 mmol/L — ABNORMAL LOW (ref 22–32)
Calcium: 9 mg/dL (ref 8.9–10.3)
Chloride: 98 mmol/L (ref 98–111)
Creatinine, Ser: 0.63 mg/dL (ref 0.44–1.00)
GFR calc Af Amer: >60 mL/min (ref >60)
GFR calc non Af Amer: >60 mL/min (ref >60)
Glucose, Bld: 328 mg/dL — ABNORMAL HIGH (ref 70–99)
Potassium: 3.5 mmol/L (ref 3.5–5.1)
Sodium: 135 mmol/L (ref 135–145)
Total Bilirubin: 0.8 mg/dL (ref 0.3–1.2)
Total Protein: 7.6 g/dL (ref 6.5–8.1)

## 2019-02-08 LAB — POCT I-STAT EG7
Acid-Base Excess: 1 mmol/L (ref 0.0–2.0)
Bicarbonate: 25.6 mmol/L (ref 20.0–28.0)
Calcium, Ion: 1.14 mmol/L — ABNORMAL LOW (ref 1.15–1.40)
HCT: 45 % (ref 36.0–46.0)
Hemoglobin: 15.3 g/dL — ABNORMAL HIGH (ref 12.0–15.0)
O2 Saturation: 89 %
Patient temperature: 98
Potassium: 3.6 mmol/L (ref 3.5–5.1)
Sodium: 135 mmol/L (ref 135–145)
TCO2: 27 mmol/L (ref 22–32)
pCO2, Ven: 38.6 mmHg — ABNORMAL LOW (ref 44.0–60.0)
pH, Ven: 7.429 (ref 7.250–7.430)
pO2, Ven: 53 mmHg — ABNORMAL HIGH (ref 32.0–45.0)

## 2019-02-08 LAB — URINALYSIS, MICROSCOPIC (REFLEX)

## 2019-02-08 LAB — URINALYSIS, ROUTINE W REFLEX MICROSCOPIC
Bilirubin Urine: NEGATIVE
Glucose, UA: >=500 mg/dL — AB
Ketones, ur: 15 mg/dL — AB
Leukocytes,Ua: NEGATIVE
Nitrite: NEGATIVE
Protein, ur: 100 mg/dL — AB
Specific Gravity, Urine: 1.025 (ref 1.005–1.030)
pH: 6 (ref 5.0–8.0)

## 2019-02-08 LAB — BASIC METABOLIC PANEL
Anion gap: 14 (ref 5–15)
BUN: 8 mg/dL (ref 6–20)
CO2: 22 mmol/L (ref 22–32)
Calcium: 8.4 mg/dL — ABNORMAL LOW (ref 8.9–10.3)
Chloride: 102 mmol/L (ref 98–111)
Creatinine, Ser: 0.66 mg/dL (ref 0.44–1.00)
GFR calc Af Amer: >60 mL/min (ref >60)
GFR calc non Af Amer: >60 mL/min (ref >60)
Glucose, Bld: 242 mg/dL — ABNORMAL HIGH (ref 70–99)
Potassium: 3.2 mmol/L — ABNORMAL LOW (ref 3.5–5.1)
Sodium: 138 mmol/L (ref 135–145)

## 2019-02-08 LAB — CBG MONITORING, ED: Glucose-Capillary: 345 mg/dL — ABNORMAL HIGH (ref 70–99)

## 2019-02-08 MED ORDER — SODIUM CHLORIDE 0.9 % IV BOLUS
1000.0000 mL | Freq: Once | INTRAVENOUS | Status: AC
  Administered 2019-02-08: 1000 mL via INTRAVENOUS

## 2019-02-08 NOTE — ED Provider Notes (Signed)
[facility] EMERGENCY DEPARTMENT Provider Note   CSN: [id number] Arrival date & time: 02/08/19  1808    History   Chief Complaint Chief Complaint  Patient presents with  . Headache  . Hyperglycemia    HPI Julia Dickerson is a 43 y.o. female.     The history is provided by the patient and medical records. No language interpreter was used.  Headache Associated symptoms: diarrhea, fatigue, nausea and weakness   Associated symptoms: no abdominal pain, no numbness and no vomiting   Hyperglycemia Associated symptoms: fatigue, nausea and weakness   Associated symptoms: no abdominal pain and no vomiting      Julia Dickerson is a 43 y.o. female  with a PMH as listed below including DM who presents to the Emergency Department by recommendation of her primary care doctor for further evaluation.  Patient states that she has been experiencing frontal headache, nausea and fatigue for 2 days.  Also has had lightheadedness, mostly with change of position.  This morning, she felt as if her symptoms were getting worse.  She had 2 loose stools.  Denies blood in the stool.  She reports that her husband works at [employer] and did have an exposure to someone who tested positive for coronavirus, although he was tested and came back negative.  Other than that, she feels she has been very safe and avoiding any type of exposure.  She does not monitor her blood sugars and is noncompliant with her medications.  She tells me that she does take her metformin as directed, but that she is on 2 types of insulin 1 at night and the other 3 times a day for meals.  She states that she does not take it because she just does not think about taking her insulin.  She is unable to tell me the names of her insulin regimen or the doses that she should be taking.  When asked how her blood sugars have been running lately, she responds "More than likely have been high because I haven't been taking my medicine like I  should" no fever or chills.  Denies abdominal pain.  Has not had any vomiting.  No chest pain or shortness of breath.  No dysuria.  Seen via virtual visit with primary care today who recommended that she go to the ER for further evaluation in person.  Past Medical History:  Diagnosis Date  . Anxiety   . Arthritis   . Atrial fibrillation (HCC)    a. paroxysmal, uses PRN Flecainide  . Depression   . Diabetes mellitus without complication (HCC)   . Dysrhythmia   . GERD (gastroesophageal reflux disease)   . Hypertension   . Obesity   . PCOS (polycystic ovarian syndrome)   . UTI (lower urinary tract infection)     Patient Active Problem List   Diagnosis Date Noted  . OSA (obstructive sleep apnea) 10/05/2018  . Weight loss 10/05/2018  . PCOS (polycystic ovarian syndrome) 08/03/2018  . Chronic diarrhea 09/19/2017  . Neuropathic pain 09/19/2017  . Type 2 diabetes mellitus with diabetic neuropathy, unspecified (HCC) 08/19/2017  . Hyperlipidemia associated with type 2 diabetes mellitus (HCC) 08/19/2017  . Tobacco use disorder 08/19/2017  . Current use of long term anticoagulation 05/31/2017  . Atrial fibrillation (HCC) 05/16/2017  . Atrial fibrillation with RVR (HCC)   . Abnormal EKG 06/24/2016  . Paroxysmal atrial fibrillation (HCC): CHA2DS2Vasc ~3 06/23/2016  . Essential hypertension 06/23/2016  . Major depression, recurrent (HCC)  07/10/2014    Past Surgical History:  Procedure Laterality Date  . DILATION AND CURETTAGE OF UTERUS  2011 and 2012   x2  . GXT - Graded Exercise Tolerance Test  07/22/2016   Exercise time 6 minutes - 7 METs. Maximum heart rate 146 BPM. - No EKG changes. No arrhythmias or chest pain.: Low risk test.  . TRANSTHORACIC ECHOCARDIOGRAM  06/2016   EF 6570% with mild LVH. Normal wall motion and diastolic function. Mild RAE. Otherwise normal.  . WISDOM TOOTH EXTRACTION       OB History    Gravida  2   Para      Term      Preterm      AB  2   Living         SAB      TAB      Ectopic      Multiple      Live Births               Home Medications    Prior to Admission medications   Medication Sig Start Date End Date Taking? Authorizing Provider  atorvastatin (LIPITOR) 40 MG tablet TAKE 1 TABLET BY MOUTH EVERY DAY 07/10/18   [place], Betty G, MD  diltiazem (CARDIZEM CD) 120 MG 24 hr capsule Take 1 capsule (120 mg total) by mouth as needed. Patient taking differently: Take 120 mg by mouth as needed (as needed).  01/19/18   Marykay LexHarding, David W, MD  diltiazem (CARDIZEM CD) 180 MG 24 hr capsule Take 1 capsule (180 mg total) by mouth daily. 01/08/19   Strader, Lennart PallBrittany M, PA-C  DULoxetine (CYMBALTA) 30 MG capsule Take 30 mg by mouth daily.    [provider]  ELIQUIS 5 MG TABS tablet TAKE 1 TABLET (5 MG TOTAL) 2 (TWO) TIMES DAILY BY MOUTH. 08/15/18   Strader, GrenadaBrittany M, PA-C  flecainide (TAMBOCOR) 100 MG tablet Take 2 tablets (200 mg total) by mouth as needed. For A-fib Patient taking differently: Take 200 mg by mouth as needed (as needed). For A-fib 08/18/16   Marykay LexHarding, David W, MD  Insulin Glargine (LANTUS SOLOSTAR) 100 UNIT/ML Solostar Pen Inject 32 Units into the skin daily. 12/12/18   Shamleffer, Konrad DoloresIbtehal Jaralla, MD  insulin lispro (HUMALOG KWIKPEN) 100 UNIT/ML KwikPen Inject 0.1 mLs (10 Units total) into the skin 3 (three) times daily. Max daily dose of 36 units 10/12/18   Shamleffer, Konrad DoloresIbtehal Jaralla, MD  Insulin Pen Needle 32G X 4 MM MISC Four times daily 09/05/18   Shamleffer, Konrad DoloresIbtehal Jaralla, MD  lisinopril (ZESTRIL) 10 MG tablet Take 1 tablet (10 mg total) by mouth daily. 11/14/18   Deeann SaintBanks, Shannon R, MD  metFORMIN (GLUCOPHAGE-XR) 500 MG 24 hr tablet Take 2 tablets (1,000 mg total) by mouth 2 (two) times daily with a meal. 12/12/18   Shamleffer, Konrad DoloresIbtehal Jaralla, MD  Multiple Vitamin (MULTIVITAMIN WITH MINERALS) TABS tablet Take 1 tablet by mouth daily.    [provider]  omeprazole (PRILOSEC) 20 MG capsule  02/09/10    [provider]  Semaglutide (RYBELSUS) 7 MG TABS Take 7 mg by mouth daily before breakfast. 10/12/18   Shamleffer, Konrad DoloresIbtehal Jaralla, MD    Family History Family History  Problem Relation Age of Onset  . Heart failure Father   . Heart attack Father   . Cancer Father   . Diabetes Father   . Hearing loss Father   . Hyperlipidemia Father   . Hypertension Father   . Arthritis  Mother   . Diabetes Mother   . COPD Maternal Grandmother   . Heart disease Maternal Grandmother   . Hyperlipidemia Maternal Grandmother   . Hyperlipidemia Maternal Grandfather   . Hypertension Maternal Grandfather   . Hearing loss Maternal Grandfather   . Alcohol abuse Maternal Grandfather   . Arthritis Maternal Grandfather   . Stroke Maternal Grandfather   . Hyperlipidemia Paternal Grandmother   . Miscarriages / Stillbirths Paternal Grandmother   . Kidney disease Paternal Grandfather   . Hypertension Paternal Grandfather   . Heart attack Paternal Grandfather   . Diabetes Paternal Grandfather   . Arthritis Sister     Social History Social History   Tobacco Use  . Smoking status: Former Smoker    Packs/day: 0.50    Years: 21.00    Pack years: 10.50    Types: Cigarettes, E-cigarettes    Quit date: 08/24/2018    Years since quitting: 0.4  . Smokeless tobacco: Never Used  . Tobacco comment: vapor  Substance Use Topics  . Alcohol use: Yes    Comment: rare  . Drug use: No     Allergies   Sulfa antibiotics   Review of Systems Review of Systems  Constitutional: Positive for appetite change and fatigue.  Gastrointestinal: Positive for diarrhea and nausea. Negative for abdominal pain, blood in stool, constipation and vomiting.  Neurological: Positive for weakness, light-headedness and headaches. Negative for syncope and numbness.  All other systems reviewed and are negative.    Physical Exam Updated Vital Signs BP (!) 151/98 (BP Location: Right Arm)   Pulse 88   Temp 98 F (36.7 C)  (Oral)   Resp 18   SpO2 98%   Physical Exam Vitals signs and nursing note reviewed.  Constitutional:      General: She is not in acute distress.    Appearance: She is well-developed.  HENT:     Head: Normocephalic and atraumatic.  Neck:     Musculoskeletal: Neck supple.  Cardiovascular:     Rate and Rhythm: Normal rate and regular rhythm.     Heart sounds: Normal heart sounds. No murmur.  Pulmonary:     Effort: Pulmonary effort is normal. No respiratory distress.     Breath sounds: Normal breath sounds.     Comments: Lungs CTA bilaterally. Abdominal:     General: There is no distension.     Palpations: Abdomen is soft.     Comments: No abdominal tenderness.  Skin:    General: Skin is warm and dry.  Neurological:     Mental Status: She is alert and oriented to person, place, and time.     Comments: Speech clear and goal oriented. CN 2-12 grossly intact. No drift. Strength and sensation intact. Steady gait.       ED Treatments / Results  Labs (all labs ordered are listed, but only abnormal results are displayed) Labs Reviewed  COMPREHENSIVE METABOLIC PANEL - Abnormal; Notable for the following components:      Result Value   CO2 21 (*)    Glucose, Bld 328 (*)    ALT 45 (*)    Anion gap 16 (*)    All other components within normal limits  CBC - Abnormal; Notable for the following components:   RBC 5.19 (*)    Hemoglobin 15.9 (*)    All other components within normal limits  URINALYSIS, ROUTINE W REFLEX MICROSCOPIC - Abnormal; Notable for the following components:   Glucose, UA >=500 (*)  Hgb urine dipstick SMALL (*)    Ketones, ur 15 (*)    Protein, ur 100 (*)    All other components within normal limits  URINALYSIS, MICROSCOPIC (REFLEX) - Abnormal; Notable for the following components:   Bacteria, UA RARE (*)    All other components within normal limits  CBG MONITORING, ED - Abnormal; Notable for the following components:   Glucose-Capillary 345 (*)    All  other components within normal limits  POCT I-STAT EG7 - Abnormal; Notable for the following components:   pCO2, Ven 38.6 (*)    pO2, Ven 53.0 (*)    Calcium, Ion 1.14 (*)    Hemoglobin 15.3 (*)    All other components within normal limits  NOVEL CORONAVIRUS, NAA (HOSPITAL ORDER, SEND-OUT TO REF LAB)  BASIC METABOLIC PANEL  I-STAT VENOUS BLOOD GAS, ED    EKG EKG Interpretation  Date/Time:  Thursday February 08 2019 18:22:14 EDT Ventricular Rate:  99 PR Interval:  134 QRS Duration: 84 QT Interval:  360 QTC Calculation: 462 R Axis:   64 Text Interpretation:  Normal sinus rhythm Septal infarct , age undetermined Abnormal ECG No significant change since last tracing Confirmed by Alvira MondaySchlossman, Erin ([id number]) on 02/08/2019 8:04:36 PM   Radiology No results found.  Procedures Procedures (including critical care time)  Medications Ordered in ED Medications  sodium chloride 0.9 % bolus 1,000 mL (0 mLs Intravenous Stopped 02/08/19 2126)  sodium chloride 0.9 % bolus 1,000 mL (1,000 mLs Intravenous New Bag/Given 02/08/19 2127)     Initial Impression / Assessment and Plan / ED Course  I have reviewed the triage vital signs and the nursing notes.  Pertinent labs & imaging results that were available during my care of the patient were reviewed by me and considered in my medical decision making (see chart for details).       Julia Dickerson is a 43 y.o. female who presents to ED for nausea, diarrhea, fatigue x 2 days.  Husband had an exposure with coronavirus, but tested negative. I have ordered send out test for her as well.  Type II diabetic who endorses noncompliance with her medications.  CMP reviewed showing hyperglycemia with CO2 of 21 and anion gap of 16.  Also had vbg with normal pH.  Only 15 ketones in the urine.  She was hydrated with 2 L of IV fluids and notes significant improvement in her symptoms.  Will recheck BMP which is pending at shift change.  Care assumed by Dr. Dalene SeltzerSchlossman  who will follow up on BMP.  If improving, anticipate home with close PCP follow-up.   Final Clinical Impressions(s) / ED Diagnoses   Final diagnoses:  Hyperglycemia    ED Discharge Orders    None       Joia Doyle, Chase PicketJaime Pilcher, PA-C 02/08/19 2228    Alvira MondaySchlossman, Erin, MD 02/09/19 [phone]

## 2019-02-08 NOTE — Progress Notes (Signed)
Virtual Visit via Video Note  I connected with Julia Dickerson  on 02/08/19 at  3:20 PM EDT by a video enabled telemedicine application and verified that I am speaking with the correct person using two identifiers.  Location patient: home Location provider:work or home office Persons participating in the virtual visit: patient, provider  I discussed the limitations of evaluation and management by telemedicine and the availability of in person appointments. The patient expressed understanding and agreed to proceed.   HPI:  Acute visit for illness/presyncope: -symptoms started 2-3 days ago -today feels like she is going to pass ourt and feels very tired, at the time of the appointment feels a little better, but still very tired "I want to just go to sleep" -other symptoms include diarrhea, nausea, headache, intermittent abd pain, dizziness, subjective fever, chills; has some nasal congestion and cough but feel this is normal for her with her allergies -1-2 episodes of loose stools today, no blood in the bowels -denies: fever, SOB, rash, dysuria, CP, palpitations -T 97.1 -no known sick exposures -she has been to [place] recently, she does wear a mask and social distances -her husband works outside of the home at a [employer] had a friend over this weekend in the home -several of her husband's coworkers did test positive for [place] recently, husband tested negative -she has diabetes, she has not been taking her insulin or checking her blood sugars -last time she ate was 1pm, checked home BS at 3:30 m during visit and BS is 338   ROS: See pertinent positives and negatives per HPI.  Past Medical History:  Diagnosis Date  . Anxiety   . Arthritis   . Atrial fibrillation (HCC)    a. paroxysmal, uses PRN Flecainide  . Depression   . Diabetes mellitus without complication ([place])   . Dysrhythmia   . GERD (gastroesophageal reflux disease)   . Hypertension   . Obesity   . PCOS  (polycystic ovarian syndrome)   . UTI (lower urinary tract infection)     Past Surgical History:  Procedure Laterality Date  . DILATION AND CURETTAGE OF UTERUS  2011 and 2012   x2  . GXT - Graded Exercise Tolerance Test  07/22/2016   Exercise time 6 minutes - 7 METs. Maximum heart rate 146 BPM. - No EKG changes. No arrhythmias or chest pain.: Low risk test.  . TRANSTHORACIC ECHOCARDIOGRAM  06/2016   EF 6570% with mild LVH. Normal wall motion and diastolic function. Mild RAE. Otherwise normal.  . WISDOM TOOTH EXTRACTION      Family History  Problem Relation Age of Onset  . Heart failure Father   . Heart attack Father   . Cancer Father   . Diabetes Father   . Hearing loss Father   . Hyperlipidemia Father   . Hypertension Father   . Arthritis Mother   . Diabetes Mother   . COPD Maternal Grandmother   . Heart disease Maternal Grandmother   . Hyperlipidemia Maternal Grandmother   . Hyperlipidemia Maternal Grandfather   . Hypertension Maternal Grandfather   . Hearing loss Maternal Grandfather   . Alcohol abuse Maternal Grandfather   . Arthritis Maternal Grandfather   . Stroke Maternal Grandfather   . Hyperlipidemia Paternal Grandmother   . Miscarriages / Stillbirths Paternal Grandmother   . Kidney disease Paternal Grandfather   . Hypertension Paternal Grandfather   . Heart attack Paternal Grandfather   . Diabetes Paternal Grandfather   . Arthritis Sister  SOCIAL HX: see hpi   Current Outpatient Medications:  .  atorvastatin (LIPITOR) 40 MG tablet, TAKE 1 TABLET BY MOUTH EVERY DAY, Disp: 90 tablet, Rfl: 2 .  diltiazem (CARDIZEM CD) 120 MG 24 hr capsule, Take 1 capsule (120 mg total) by mouth as needed. (Patient taking differently: Take 120 mg by mouth as needed (as needed). ), Disp: 90 capsule, Rfl: 1 .  diltiazem (CARDIZEM CD) 180 MG 24 hr capsule, Take 1 capsule (180 mg total) by mouth daily., Disp: 90 capsule, Rfl: 1 .  DULoxetine (CYMBALTA) 30 MG capsule, Take 30 mg  by mouth daily., Disp: , Rfl:  .  ELIQUIS 5 MG TABS tablet, TAKE 1 TABLET (5 MG TOTAL) 2 (TWO) TIMES DAILY BY MOUTH., Disp: 180 tablet, Rfl: 1 .  flecainide (TAMBOCOR) 100 MG tablet, Take 2 tablets (200 mg total) by mouth as needed. For A-fib (Patient taking differently: Take 200 mg by mouth as needed (as needed). For A-fib), Disp: 10 tablet, Rfl: 6 .  Insulin Glargine (LANTUS SOLOSTAR) 100 UNIT/ML Solostar Pen, Inject 32 Units into the skin daily., Disp: 15 mL, Rfl: 11 .  insulin lispro (HUMALOG KWIKPEN) 100 UNIT/ML KwikPen, Inject 0.1 mLs (10 Units total) into the skin 3 (three) times daily. Max daily dose of 36 units, Disp: 15 mL, Rfl: 11 .  Insulin Pen Needle 32G X 4 MM MISC, Four times daily, Disp: 150 each, Rfl: 3 .  lisinopril (ZESTRIL) 10 MG tablet, Take 1 tablet (10 mg total) by mouth daily., Disp: 90 tablet, Rfl: 0 .  metFORMIN (GLUCOPHAGE-XR) 500 MG 24 hr tablet, Take 2 tablets (1,000 mg total) by mouth 2 (two) times daily with a meal., Disp: 120 tablet, Rfl: 11 .  Multiple Vitamin (MULTIVITAMIN WITH MINERALS) TABS tablet, Take 1 tablet by mouth daily., Disp: , Rfl:  .  omeprazole (PRILOSEC) 20 MG capsule, , Disp: , Rfl:  .  Semaglutide (RYBELSUS) 7 MG TABS, Take 7 mg by mouth daily before breakfast., Disp: 90 tablet, Rfl: 1  EXAM:  VITALS per patient if applicable:  GENERAL: alert, oriented, appear tired  HEENT: atraumatic, conjunttiva clear, no obvious abnormalities on inspection of external nose and ears, dry lips  NECK: normal movements of the head and neck  LUNGS: on inspection no signs of respiratory distress, breathing rate appears normal, no obvious gross SOB, gasping or wheezing  CV: no obvious cyanosis  MS: moves all visible extremities without noticeable abnormality  PSYCH/NEURO: pleasant and cooperative, no obvious depression or anxiety, speech and thought processing grossly intact  ASSESSMENT AND PLAN:  Discussed the following assessment and plan:  Postural  dizziness with presyncope -  Hyperglycemia  Fatigue, unspecified type  Diarrhea, unspecified type  -we discussed possible serious and likely etiologies, workup and treatment,  risks and precautions. Given her significant PMH and complex concerning presentation advised referral for in-person evaluation. She needs labs/studies, exam. Offered to arrange EMS transport. -after this discussion, Vena Austrialeanor was in agreement, but declined EMS transport as feels a little better and feels her husband can take her and agrees to go promptly today.She was going to decide with husband on where to go. Did let her know that [place] locations would have acces to notes from this visit.  I discussed the assessment and treatment plan with the patient. The patient was provided an opportunity to ask questions and all were answered. The patient agreed with the plan and demonstrated an understanding of the instructions.     Terressa KoyanagiHannah R Kim, DO

## 2019-02-08 NOTE — Discharge Instructions (Addendum)
It was my pleasure taking care of you today!   Please call your primary care doctor in the morning to schedule a follow up appointment.   It is important to take all of your diabetes medications as directed and monitor your blood sugars regularly.   Return to ER for new or worsening symptoms, any additional concerns.

## 2019-02-08 NOTE — Telephone Encounter (Signed)
Pt called stating that she has had a headache since Tuesday this week with nausea no vomiting.  She has taken her temperature and has no fever. 97.1 today. She has a noticeable cough that she states is from post nasal drip. She describes bad chills last night.  She works from home but her husband works outside the home and was tested negative for COVID-19 after exposure to two positive co-workers. Pt denies any exposure. Care advice read to patient .Patient verbalized understanding. Call transferred to office for scheduling. Reason for Disposition . [1] COVID-19 infection suspected by caller or triager AND [2] mild symptoms (cough, fever, or others) AND [0] no complications or SOB  Answer Assessment - Initial Assessment Questions 1. COVID-19 DIAGNOSIS: "Who made your Coronavirus (COVID-19) diagnosis?" "Was it confirmed by a positive lab test?" If not diagnosed by a HCP, ask "Are there lots of cases (community spread) where you live?" (See public health department website, if unsure)     Only negative 2. ONSET: "When did the COVID-19 symptoms start?"     2 days ago 3. WORST SYMPTOM: "What is your worst symptom?" (e.g., cough, fever, shortness of breath, muscle aches)    headache and nausea 4. COUGH: "Do you have a cough?" If so, ask: "How bad is the cough?"       Cough from sinus 5. FEVER: "Do you have a fever?" If so, ask: "What is your temperature, how was it measured, and when did it start?"   No 97.1 6. RESPIRATORY STATUS: "Describe your breathing?" (e.g., shortness of breath, wheezing, unable to speak)      no 7. BETTER-SAME-WORSE: "Are you getting better, staying the same or getting worse compared to yesterday?"  If getting worse, ask, "In what way?"    worse 8. HIGH RISK DISEASE: "Do you have any chronic medical problems?" (e.g., asthma, heart or lung disease, weak immune system, etc.)     A fib, diabetes 9. PREGNANCY: "Is there any chance you are pregnant?" "When was your last menstrual  period?"     No Menopause 10. OTHER SYMPTOMS: "Do you have any other symptoms?"  (e.g., chills, fatigue, headache, loss of smell or taste, muscle pain, sore throat)      Chills, sweats, headache, tired, nausea  Protocols used: CORONAVIRUS (COVID-19) DIAGNOSED OR SUSPECTED-A-AH

## 2019-02-08 NOTE — ED Triage Notes (Addendum)
Headache since Tuesday with nausea. Did a virtual visit with PCP today and told to come get tested for COVID. Also concerned because blood sugar has been running high. She states she does not take insulin as prescribed. Also reports dizziness.

## 2019-02-09 NOTE — Telephone Encounter (Signed)
Pt seen via virtual visit w/Kim and in ED yesterday.

## 2019-02-09 NOTE — ED Provider Notes (Signed)
  Physical Exam  BP (!) 148/88 (BP Location: Right Arm)   Pulse 78   Temp 98.4 F (36.9 C) (Oral)   Resp 18   SpO2 98%   Physical Exam  ED Course/Procedures     Procedures  MDM  43yo female presents with concern for nausea, diarrhea, fatigue.  Mild anion gap with CO2 of 21 on initial labs. Very mild signs of diabetic ketosis.  Given IV fluids, and repeat labs shw improvement with no AG, bicarb 22, glucose 242.    Send out COVID testing in process. Recommend continued supportive care and monitoring of glucose. Patient discharged in stable condition with understanding of reasons to return.    Julia Dickerson was evaluated in Emergency Department on 02/09/2019 for the symptoms described in the history of present illness. She was evaluated in the context of the global COVID-19 pandemic, which necessitated consideration that the patient might be at risk for infection with the SARS-CoV-2 virus that causes COVID-19. Institutional protocols and algorithms that pertain to the evaluation of patients at risk for COVID-19 are in a state of rapid change based on information released by regulatory bodies including the CDC and federal and state organizations. These policies and algorithms were followed during the patient's care in the ED.        Gareth Morgan, MD 02/09/19 [phone]

## 2019-02-10 LAB — NOVEL CORONAVIRUS, NAA (HOSP ORDER, SEND-OUT TO REF LAB; TAT 18-24 HRS): SARS-CoV-2, NAA: NOT DETECTED

## 2019-03-13 ENCOUNTER — Other Ambulatory Visit: Payer: Self-pay

## 2019-03-13 DIAGNOSIS — E1140 Type 2 diabetes mellitus with diabetic neuropathy, unspecified: Secondary | ICD-10-CM

## 2019-03-13 MED ORDER — METFORMIN HCL ER 500 MG PO TB24: 1000.0000 mg | ORAL_TABLET | Freq: Two times a day (BID) | ORAL | 11 refills | Status: DC

## 2019-03-14 ENCOUNTER — Ambulatory Visit: Payer: 59 | Admitting: Internal Medicine

## 2019-03-14 ENCOUNTER — Encounter: Payer: Self-pay | Admitting: Internal Medicine

## 2019-03-14 VITALS — BP 142/98 | HR 92 | Temp 98.2°F | Ht 67.0 in | Wt 255.6 lb

## 2019-03-14 DIAGNOSIS — E1140 Type 2 diabetes mellitus with diabetic neuropathy, unspecified: Secondary | ICD-10-CM

## 2019-03-14 LAB — POCT GLYCOSYLATED HEMOGLOBIN (HGB A1C): Hemoglobin A1C: 7.9 % — AB (ref 4.0–5.6)

## 2019-03-14 NOTE — Progress Notes (Signed)
Name: Julia Dickerson  Age/ Sex: 43 y.o., female   MRN/ DOB: [id number], July 06, 1976     PCP: Deeann SaintBanks, Shannon R, MD   Reason for Endocrinology Evaluation: Type 2 diabetes Mellitus  Initial Endocrine Consultative Visit:  09/05/2018    PATIENT IDENTIFIER: [name] is a 43 y.o. female with a past medical history of PCOS, T2DM, PAF and HTN.  The patient has followed with Endocrinology clinic since 09/05/2018 for consultative assistance with management of her diabetes.  DIABETIC HISTORY:  [name] was diagnosed with T2DM since the 2000's.  She was initially on metformin, she was switched to MDI insulin regimen which helped control her hyperglycemia at the time, the patient was subsequently switched to oral glycemic agents in 2012 due to improved glucose control.  She was restarted on basal insulin in 2019 due to persistent hyperglycemia, she has tried Victoza in the past with severe nausea. Her hemoglobin A1c has ranged from 8.7% in in 2019, peaking at 10.7% in 08/14/2018.   Prandial insulin started in February 2020, in addition to continuing her metformin and basal insulin. Rybelsus is cost prohibitive   SUBJECTIVE:   During the last visit (12/12/2018): She had continued with non-compliance. We continued  Basaglar,  prandial insulin , and asked her to titrate metformin up.  Today (03/14/2019): [name] is here for a 3 month follow up on diabetes management.  She checks her blood sugars 3 times daily, preprandial  and bedtime The patient has not had hypoglycemic episodes since the last clinic visit but has required emergency room intervention for hyperglycemia on 02/09/2019 with a serum glucose 328 mg/dL    ROS: As per HPI and as detailed below: Review of Systems  Constitutional: Negative for fever and weight loss.  HENT: Negative for congestion and sore throat.   Gastrointestinal: Negative for diarrhea and nausea.  Genitourinary: Negative for frequency.  Endo/Heme/Allergies:  Negative for polydipsia.      HOME DIABETES REGIMEN:  Basaglar 32 units daily- taking 30 units  NovoLog 10 units 3 times daily q. AC- taking 13 units   Metformin 500 mg XR 2 tablets BID  DIABETIC COMPLICATIONS: Microvascular complications:   Neuropathy   Denies: CKD, retinopathy   Last eye exam: Completed 09/15/2018  Macrovascular complications:    Denies: CAD, PVD, CVA   METER DOWNLOAD SUMMARY:   BG > 250 mg/dL     HISTORY:  Past Medical History:  Past Medical History:  Diagnosis Date   Anxiety    Arthritis    Atrial fibrillation (HCC)    a. paroxysmal, uses PRN Flecainide   Depression    Diabetes mellitus without complication (HCC)    Dysrhythmia    GERD (gastroesophageal reflux disease)    Hypertension    Obesity    PCOS (polycystic ovarian syndrome)    UTI (lower urinary tract infection)    Past Surgical History:  Past Surgical History:  Procedure Laterality Date   DILATION AND CURETTAGE OF UTERUS  2011 and 2012   x2   GXT - Graded Exercise Tolerance Test  07/22/2016   Exercise time 6 minutes - 7 METs. Maximum heart rate 146 BPM. - No EKG changes. No arrhythmias or chest pain.: Low risk test.   TRANSTHORACIC ECHOCARDIOGRAM  06/2016   EF 6570% with mild LVH. Normal wall motion and diastolic function. Mild RAE. Otherwise normal.   WISDOM TOOTH EXTRACTION      Social History:  reports that she quit smoking about 6 months ago.  Her smoking use included cigarettes and e-cigarettes. She has a 10.50 pack-year smoking history. She has never used smokeless tobacco. She reports current alcohol use. She reports that she does not use drugs. Family History:  Family History  Problem Relation Age of Onset   Heart failure Father    Heart attack Father    Cancer Father    Diabetes Father    Hearing loss Father    Hyperlipidemia Father    Hypertension Father    Arthritis Mother    Diabetes Mother    COPD Maternal Grandmother     Heart disease Maternal Grandmother    Hyperlipidemia Maternal Grandmother    Hyperlipidemia Maternal Grandfather    Hypertension Maternal Grandfather    Hearing loss Maternal Grandfather    Alcohol abuse Maternal Grandfather    Arthritis Maternal Grandfather    Stroke Maternal Grandfather    Hyperlipidemia Paternal Grandmother    Miscarriages / Stillbirths Paternal Grandmother    Kidney disease Paternal Grandfather    Hypertension Paternal Grandfather    Heart attack Paternal Grandfather    Diabetes Paternal Grandfather    Arthritis Sister      HOME MEDICATIONS: Allergies as of 03/14/2019      Reactions   Sulfa Antibiotics Hives      Medication List       Accurate as of March 14, 2019 10:32 AM. If you have any questions, ask your nurse or doctor.        atorvastatin 40 MG tablet Commonly known as: LIPITOR TAKE 1 TABLET BY MOUTH EVERY DAY   diltiazem 120 MG 24 hr capsule Commonly known as: CARDIZEM CD Take 1 capsule (120 mg total) by mouth as needed. What changed: reasons to take this   diltiazem 180 MG 24 hr capsule Commonly known as: CARDIZEM CD Take 1 capsule (180 mg total) by mouth daily. What changed: Another medication with the same name was changed. Make sure you understand how and when to take each.   DULoxetine 30 MG capsule Commonly known as: CYMBALTA Take 30 mg by mouth daily.   Eliquis 5 MG Tabs tablet Generic drug: apixaban TAKE 1 TABLET (5 MG TOTAL) 2 (TWO) TIMES DAILY BY MOUTH.   flecainide 100 MG tablet Commonly known as: TAMBOCOR Take 2 tablets (200 mg total) by mouth as needed. For A-fib What changed: reasons to take this   Insulin Glargine 100 UNIT/ML Solostar Pen Commonly known as: Lantus SoloStar Inject 32 Units into the skin daily.   insulin lispro 100 UNIT/ML KwikPen Commonly known as: HumaLOG KwikPen Inject 0.1 mLs (10 Units total) into the skin 3 (three) times daily. Max daily dose of 36 units   Insulin Pen Needle  32G X 4 MM Misc Four times daily   lisinopril 10 MG tablet Commonly known as: ZESTRIL Take 1 tablet (10 mg total) by mouth daily.   metFORMIN 500 MG 24 hr tablet Commonly known as: GLUCOPHAGE-XR Take 2 tablets (1,000 mg total) by mouth 2 (two) times daily with a meal.   multivitamin with minerals Tabs tablet Take 1 tablet by mouth daily.   omeprazole 20 MG capsule Commonly known as: PRILOSEC   Semaglutide 7 MG Tabs Commonly known as: Rybelsus Take 7 mg by mouth daily before breakfast.        OBJECTIVE:   Vital Signs: BP (!) 142/98 (BP Location: Left Arm, Patient Position: Sitting, Cuff Size: Normal)    Pulse 92    Temp 98.2 F (36.8 C)    Ht  5\' 7"  (1.702 m)    Wt 255 lb 9.6 oz (115.9 kg)    SpO2 98%    BMI 40.03 kg/m   Wt Readings from Last 3 Encounters:  03/14/19 255 lb 9.6 oz (115.9 kg)  11/10/18 253 lb (114.8 kg)  10/12/18 250 lb 12.8 oz (113.8 kg)     Exam: General: Pt appears well and is in NAD  Lungs: Clear with good BS bilat with no rales, rhonchi, or wheezes  Heart: RRR with normal S1 and S2 and no gallops; no murmurs; no rub  Abdomen: Normoactive bowel sounds, soft, nontender, without masses or organomegaly palpable  Extremities: No pretibial edema. No tremor. Normal strength and motion throughout. See detailed diabetic foot exam below.  Skin: Normal texture and temperature to palpation. No rash noted.  Neuro: MS is good with appropriate affect, pt is alert and Ox3        DM foot exam: 03/14/19 The skin of the feet is intact without sores or ulcerations. The pedal pulses are 2+ on right and 2+ on left. The sensation is absent  to a screening 5.07, 10 gram monofilament bilaterally       DATA REVIEWED:  Lab Results  Component Value Date   HGBA1C 7.9 (A) 03/14/2019   HGBA1C 10.7 (H) 08/23/2018   HGBA1C 10.5 (H) 06/19/2018   Lab Results  Component Value Date   MICROALBUR 7.4 (H) 12/26/2017   CREATININE 0.66 02/08/2019   Lab Results  Component  Value Date   MICRALBCREAT 6.8 12/26/2017     Lab Results  Component Value Date   CHOL 204 (H) 08/22/2017   HDL 27.60 (L) 08/22/2017   LDLDIRECT 140.0 08/22/2017   TRIG 302.0 (H) 08/22/2017   CHOLHDL 7 08/22/2017         ASSESSMENT / PLAN / RECOMMENDATIONS:   1) Type 2 Diabetes Mellitus, Sub-Optimally Controlled, With Neuropathic Complications - Most recent A1c of 7.9 %. Goal A1c < 7.0 %.    - Pt has come a long way with her glucose care, I have praised her on the recent lifestyle changes.  - She is intolerant to victoza in the past due to nausea. She  Has concerns about yeast infections with SGLT-2 inhibitors, we tried her on Rybelsus and with the coupon it was going to cost her $25 a month which is prohibitive to her.  - She tends to have hyperglycemia during the day, she would eat a sandwich , she considers this a "snack" and wouldn't take novolog. Last night she ate cereal at 9 pm and by MN her Bg > 300 mg.dL as she didn't take novolog.  - Education was provided today    MEDICATIONS:   Basaglar 30 units daily  Novolog 12 units , If pre-meal BG is > 200 add 2 units of novolog  Continue Metformin 750 mg XR BID  EDUCATION / INSTRUCTIONS:  BG monitoring instructions: Patient is instructed to check her blood sugars 4 times a day, fasting and bedtime.  Call  Endocrinology clinic if: BG persistently < 70 or > 300.  I reviewed the Rule of 15 for the treatment of hypoglycemia in detail with the patient. Literature supplied.     F/U in 3 months   Signed electronically by: Lyndle HerrlichAbby Jaralla Darren Caldron, MD  [facility]  [facility] [address] [place], [place] [address] Phone: [phone] FAX: [phone]   CC: Deeann SaintBanks, Shannon R, MD [address] [place] [address] Phone: [phone]  Fax: [phone]  Return to Endocrinology clinic as below: No future appointments.

## 2019-03-14 NOTE — Patient Instructions (Signed)
-   Continue Basaglar 30 units daily  - Novolog 12 units with each meal, if your pre-meal sugar is 200 or higher, take 14 units with that meal    - Continue Metformin 2 tablets twice a day     Choose healthy, lower carb lower calorie snacks: toss salad, cooked vegetables, cottage cheese, peanut butter, low fat cheese / string cheese, lower sodium deli meat, tuna salad or chicken salad    HOW TO TREAT LOW BLOOD SUGARS (Blood sugar LESS THAN 70 MG/DL)  Please follow the RULE OF 15 for the treatment of hypoglycemia treatment (when your (blood sugars are less than 70 mg/dL)    STEP 1: Take 15 grams of carbohydrates when your blood sugar is low, which includes:   3-4 GLUCOSE TABS  OR  3-4 OZ OF JUICE OR REGULAR SODA OR  ONE TUBE OF GLUCOSE GEL     STEP 2: RECHECK blood sugar in 15 MINUTES STEP 3: If your blood sugar is still low at the 15 minute recheck --> then, go back to STEP 1 and treat AGAIN with another 15 grams of carbohydrates.

## 2019-03-19 ENCOUNTER — Other Ambulatory Visit: Payer: Self-pay

## 2019-03-19 MED ORDER — APIXABAN 5 MG PO TABS: ORAL_TABLET | ORAL | 1 refills | Status: DC

## 2019-03-19 NOTE — Telephone Encounter (Signed)
14f 115.9kg Scr 0.66 02/08/19 Lovw/strader 05/31/17

## 2019-04-17 ENCOUNTER — Other Ambulatory Visit: Payer: Self-pay | Admitting: Family Medicine

## 2019-04-17 DIAGNOSIS — E1169 Type 2 diabetes mellitus with other specified complication: Secondary | ICD-10-CM

## 2019-04-17 DIAGNOSIS — E785 Hyperlipidemia, unspecified: Secondary | ICD-10-CM

## 2019-05-09 ENCOUNTER — Other Ambulatory Visit: Payer: Self-pay | Admitting: Internal Medicine

## 2019-06-12 ENCOUNTER — Other Ambulatory Visit: Payer: Self-pay

## 2019-06-14 ENCOUNTER — Other Ambulatory Visit: Payer: Self-pay | Admitting: Internal Medicine

## 2019-06-14 ENCOUNTER — Encounter: Payer: Self-pay | Admitting: Internal Medicine

## 2019-06-14 ENCOUNTER — Ambulatory Visit: Payer: 59 | Admitting: Internal Medicine

## 2019-06-14 ENCOUNTER — Other Ambulatory Visit: Payer: Self-pay

## 2019-06-14 VITALS — BP 122/72 | HR 89 | Resp 16 | Ht 67.0 in | Wt 262.0 lb

## 2019-06-14 DIAGNOSIS — E1140 Type 2 diabetes mellitus with diabetic neuropathy, unspecified: Secondary | ICD-10-CM

## 2019-06-14 DIAGNOSIS — E781 Pure hyperglyceridemia: Secondary | ICD-10-CM

## 2019-06-14 DIAGNOSIS — E785 Hyperlipidemia, unspecified: Secondary | ICD-10-CM

## 2019-06-14 DIAGNOSIS — Z794 Long term (current) use of insulin: Secondary | ICD-10-CM

## 2019-06-14 LAB — LIPID PANEL
Cholesterol: 178 mg/dL (ref 0–200)
HDL: 34.7 mg/dL — ABNORMAL LOW (ref >39.00)
NonHDL: 143.14
Total CHOL/HDL Ratio: 5
Triglycerides: 240 mg/dL — ABNORMAL HIGH (ref 0.0–149.0)
VLDL: 48 mg/dL — ABNORMAL HIGH (ref 0.0–40.0)

## 2019-06-14 LAB — POCT GLYCOSYLATED HEMOGLOBIN (HGB A1C): Hemoglobin A1C: 8.7 % — AB (ref 4.0–5.6)

## 2019-06-14 LAB — MICROALBUMIN / CREATININE URINE RATIO
Creatinine,U: 135.2 mg/dL
Microalb Creat Ratio: 9.9 mg/g (ref 0.0–30.0)
Microalb, Ur: 13.4 mg/dL — ABNORMAL HIGH (ref 0.0–1.9)

## 2019-06-14 LAB — BASIC METABOLIC PANEL
BUN: 11 mg/dL (ref 6–23)
CO2: 26 mEq/L (ref 19–32)
Calcium: 9.5 mg/dL (ref 8.4–10.5)
Chloride: 101 mEq/L (ref 96–112)
Creatinine, Ser: 0.74 mg/dL (ref 0.40–1.20)
GFR: 85.71 mL/min (ref >60.00)
Glucose, Bld: 223 mg/dL — ABNORMAL HIGH (ref 70–99)
Potassium: 4.4 mEq/L (ref 3.5–5.1)
Sodium: 138 mEq/L (ref 135–145)

## 2019-06-14 LAB — GLUCOSE, POCT (MANUAL RESULT ENTRY): POC Glucose: 236 mg/dl — AB (ref 70–99)

## 2019-06-14 LAB — LDL CHOLESTEROL, DIRECT: Direct LDL: 115 mg/dL

## 2019-06-14 MED ORDER — NOVOLOG MIX 70/30 FLEXPEN (70-30) 100 UNIT/ML ~~LOC~~ SUPN: 30.0000 [IU] | PEN_INJECTOR | Freq: Two times a day (BID) | SUBCUTANEOUS | 11 refills | Status: DC

## 2019-06-14 MED ORDER — TRULICITY 0.75 MG/0.5ML ~~LOC~~ SOAJ: 0.7500 mg | SUBCUTANEOUS | 6 refills | Status: DC

## 2019-06-14 NOTE — Progress Notes (Signed)
Name: Julia Dickerson  Age/ Sex: 43 y.o., female   MRN/ DOB: [id number], 07-Aug-1975     PCP: Deeann Saint, MD   Reason for Endocrinology Evaluation: Type 2 diabetes Mellitus  Initial Endocrine Consultative Visit:  09/05/2018    PATIENT IDENTIFIER: [name] is a 43 y.o. female with a past medical history of PCOS, T2DM, PAF and HTN.  The patient has followed with Endocrinology clinic since 09/05/2018 for consultative assistance with management of her diabetes.  DIABETIC HISTORY:  Ms. Medal was diagnosed with T2DM since the 2000's.  She was initially on metformin, she was switched to MDI insulin regimen which helped control her hyperglycemia at the time, the patient was subsequently switched to oral glycemic agents in 2012 due to improved glucose control.  She was restarted on basal insulin in 2019 due to persistent hyperglycemia, she has tried Victoza in the past with severe nausea. Her hemoglobin A1c has ranged from 8.7% in in 2019, peaking at 10.7% in 08/14/2018.   Prandial insulin started in February 2020, in addition to continuing her metformin and basal insulin. Rybelsus is cost prohibitive   SUBJECTIVE:   During the last visit (03/14/2019): She had continued with non-compliance. We continued  Basaglar,  prandial insulin , as well as metformin .  Today (06/15/2019): [name] is here for a 3 month follow up on diabetes management. She unfortunately continues to checks glucose sporadically and skips on taking insulin.The patient has not had hypoglycemic episodes since the last clinic visit but has required emergency room intervention for hyperglycemia on 02/09/2019 with a serum glucose 328 mg/dL    ROS: As per HPI and as detailed below: Review of Systems  Constitutional: Negative for fever and weight loss.  HENT: Negative for congestion and sore throat.   Gastrointestinal: Negative for diarrhea and nausea.  Genitourinary: Negative for frequency.  Endo/Heme/Allergies:  Negative for polydipsia.      HOME DIABETES REGIMEN:  Basaglar 30 units daily, misses it 1-2 days a week  NovoLog 12 units 3 times daily- takes 14 most days due to high BG Metformin 500 mg XR 2 tabs BID   DIABETIC COMPLICATIONS: Microvascular complications:   Neuropathy   Denies: CKD, retinopathy   Last eye exam: Completed 09/15/2018  Macrovascular complications:    Denies: CAD, PVD, CVA   METER DOWNLOAD SUMMARY: Did not bring     HISTORY:  Past Medical History:  Past Medical History:  Diagnosis Date  . Anxiety   . Arthritis   . Atrial fibrillation (HCC)    a. paroxysmal, uses PRN Flecainide  . Depression   . Diabetes mellitus without complication (HCC)   . Dysrhythmia   . GERD (gastroesophageal reflux disease)   . Hypertension   . Obesity   . PCOS (polycystic ovarian syndrome)   . UTI (lower urinary tract infection)    Past Surgical History:  Past Surgical History:  Procedure Laterality Date  . DILATION AND CURETTAGE OF UTERUS  2011 and 2012   x2  . GXT - Graded Exercise Tolerance Test  07/22/2016   Exercise time 6 minutes - 7 METs. Maximum heart rate 146 BPM. - No EKG changes. No arrhythmias or chest pain.: Low risk test.  . TRANSTHORACIC ECHOCARDIOGRAM  06/2016   EF 6570% with mild LVH. Normal wall motion and diastolic function. Mild RAE. Otherwise normal.  . WISDOM TOOTH EXTRACTION      Social History:  reports that she quit smoking about 9 months ago. Her smoking  use included cigarettes and e-cigarettes. She has a 10.50 pack-year smoking history. She has never used smokeless tobacco. She reports current alcohol use. She reports that she does not use drugs. Family History:  Family History  Problem Relation Age of Onset  . Heart failure Father   . Heart attack Father   . Cancer Father   . Diabetes Father   . Hearing loss Father   . Hyperlipidemia Father   . Hypertension Father   . Arthritis Mother   . Diabetes Mother   . COPD Maternal  Grandmother   . Heart disease Maternal Grandmother   . Hyperlipidemia Maternal Grandmother   . Hyperlipidemia Maternal Grandfather   . Hypertension Maternal Grandfather   . Hearing loss Maternal Grandfather   . Alcohol abuse Maternal Grandfather   . Arthritis Maternal Grandfather   . Stroke Maternal Grandfather   . Hyperlipidemia Paternal Grandmother   . Miscarriages / Stillbirths Paternal Grandmother   . Kidney disease Paternal Grandfather   . Hypertension Paternal Grandfather   . Heart attack Paternal Grandfather   . Diabetes Paternal Grandfather   . Arthritis Sister      HOME MEDICATIONS: Allergies as of 06/14/2019      Reactions   Sulfa Antibiotics Hives      Medication List       Accurate as of June 14, 2019 11:59 PM. If you have any questions, ask your nurse or doctor.        STOP taking these medications   Insulin Glargine 100 UNIT/ML Solostar Pen Commonly known as: Lantus SoloStar Stopped by: Scarlette ShortsIbtehal J Katilynn Sinkler, MD   insulin lispro 100 UNIT/ML KwikPen Commonly known as: HumaLOG KwikPen Stopped by: Scarlette ShortsIbtehal J Zemira Zehring, MD   Semaglutide 7 MG Tabs Commonly known as: Rybelsus Stopped by: Scarlette ShortsIbtehal J Chaquita Basques, MD     TAKE these medications   apixaban 5 MG Tabs tablet Commonly known as: Eliquis TAKE 1 TABLET (5 MG TOTAL) 2 (TWO) TIMES DAILY BY MOUTH.   atorvastatin 40 MG tablet Commonly known as: LIPITOR TAKE 1 TABLET BY MOUTH EVERY DAY   diltiazem 120 MG 24 hr capsule Commonly known as: CARDIZEM CD Take 1 capsule (120 mg total) by mouth as needed. What changed: reasons to take this   diltiazem 180 MG 24 hr capsule Commonly known as: CARDIZEM CD Take 1 capsule (180 mg total) by mouth daily. What changed: Another medication with the same name was changed. Make sure you understand how and when to take each.   DULoxetine 30 MG capsule Commonly known as: CYMBALTA Take 30 mg by mouth daily.   flecainide 100 MG tablet Commonly known as:  TAMBOCOR Take 2 tablets (200 mg total) by mouth as needed. For A-fib What changed: reasons to take this   HumaLOG Mix 75/25 (75-25) 100 UNIT/ML Susp injection Generic drug: insulin lispro protamine-lispro Inject 30 Units into the skin 2 (two) times daily with a meal. Started by: Scarlette ShortsIbtehal J Shanon Seawright, MD   Insulin Pen Needle 32G X 4 MM Misc Four times daily   lisinopril 10 MG tablet Commonly known as: ZESTRIL Take 1 tablet (10 mg total) by mouth daily.   metFORMIN 500 MG 24 hr tablet Commonly known as: GLUCOPHAGE-XR Take 2 tablets (1,000 mg total) by mouth 2 (two) times daily with a meal.   multivitamin with minerals Tabs tablet Take 1 tablet by mouth daily.   omeprazole 20 MG capsule Commonly known as: PRILOSEC   Trulicity 0.75 MG/0.5ML Sopn Generic drug: Dulaglutide Inject 0.75 mg  into the skin once a week. Started by: Dorita Sciara, MD        OBJECTIVE:   Vital Signs: BP 122/72   Pulse 89   Resp 16   Ht 5\' 7"  (1.702 m)   Wt 262 lb (118.8 kg)   SpO2 98%   BMI 41.04 kg/m   Wt Readings from Last 3 Encounters:  06/14/19 262 lb (118.8 kg)  03/14/19 255 lb 9.6 oz (115.9 kg)  11/10/18 253 lb (114.8 kg)     Exam: General: Pt appears well and is in NAD  Lungs: Clear with good BS bilat with no rales, rhonchi, or wheezes  Heart: RRR with normal S1 and S2 and no gallops; no murmurs; no rub  Abdomen: Normoactive bowel sounds, soft, nontender, without masses or organomegaly palpable  Extremities: No pretibial edema. No tremor.   Skin: Normal texture and temperature to palpation. No rash noted.  Neuro: MS is good with appropriate affect, pt is alert and Ox3        DM foot exam: 03/14/19 The skin of the feet is intact without sores or ulcerations. The pedal pulses are 2+ on right and 2+ on left. The sensation is absent  to a screening 5.07, 10 gram monofilament bilaterally       DATA REVIEWED:  Lab Results  Component Value Date   HGBA1C 8.7 (A)  06/14/2019   HGBA1C 7.9 (A) 03/14/2019   HGBA1C 10.7 (H) 08/23/2018    Results for ANTANIA, HOEFLING (MRN [id number]) as of 06/15/2019 08:09  Ref. Range 06/14/2019 10:32  Sodium Latest Ref Range: 135 - 145 mEq/L 138  Potassium Latest Ref Range: 3.5 - 5.1 mEq/L 4.4  Chloride Latest Ref Range: 96 - 112 mEq/L 101  CO2 Latest Ref Range: 19 - 32 mEq/L 26  Glucose Latest Ref Range: 70 - 99 mg/dL 223 (H)  BUN Latest Ref Range: 6 - 23 mg/dL 11  Creatinine Latest Ref Range: 0.40 - 1.20 mg/dL 0.74  Calcium Latest Ref Range: 8.4 - 10.5 mg/dL 9.5  GFR Latest Ref Range: >60.00 mL/min 85.71  Total CHOL/HDL Ratio Unknown 5  Cholesterol Latest Ref Range: 0 - 200 mg/dL 178  HDL Cholesterol Latest Ref Range: >39.00 mg/dL 34.70 (L)  Direct LDL Latest Units: mg/dL 115.0  MICROALB/CREAT RATIO Latest Ref Range: 0.0 - 30.0 mg/g 9.9  NonHDL Unknown 143.14  Triglycerides Latest Ref Range: 0.0 - 149.0 mg/dL 240.0 (H)  VLDL Latest Ref Range: 0.0 - 40.0 mg/dL 48.0 (H)  Creatinine,U Latest Units: mg/dL 135.2  Microalb, Ur Latest Ref Range: 0.0 - 1.9 mg/dL 13.4 (H)   ASSESSMENT / PLAN / RECOMMENDATIONS:   1) Type 2 Diabetes Mellitus, Poorly  Controlled, With Neuropathic Complications - Most recent A1c of 8.7 %. Goal A1c < 7.0 %.    - Poorly controlled diabetes due to medication none- adherence and dietary indiscretions  - She is intolerant to victoza in the past due to nausea. She  Has concerns about yeast infections with SGLT-2 inhibitors, we tried her on Rybelsus and with the coupon, but it was going to cost her $25 a month which is prohibitive to her.  - Counseling and education were provided today  - She has opted to switch to insulin mix, she thinks this maybe more doable then her current MDI regimen , we also discussed giving another try to GLP-1 agonists in the injectable form, no hx of pancreatitis and no history of medullary cancer    MEDICATIONS:  - Stop  Basaglar  - STOP Novolog - Continue  Metformin 500 mg 2 tabs Twice daily  - Start Trulicity 0.75 mg once weekly  - Start Novolog Mix 30 units with First and Last meal of the day    EDUCATION / INSTRUCTIONS:  BG monitoring instructions: Patient is instructed to check her blood sugars 4 times a day, fasting and bedtime.  Call [facility] if: BG persistently < 70 or > 300. . I reviewed the Rule of 15 for the treatment of hypoglycemia in detail with the patient. Literature supplied.  2. Dyslipidemia :   - Triglycerides are elevated due to poor diet, with increased CHO intake and poorly controlled DM - LDL is above goal despite being on lipitor 40 mg, this is most consistent with non-compliance - Will discussed the cardiovascular benefits of statins.      F/U in 3 months   Signed electronically by: Lyndle Herrlich, MD  [facility]  [facility] [address] [place] [place], [place] [address] Phone: [phone] FAX: [phone]   CC: Deeann Saint, MD [address] [place] [address] Phone: [phone]  Fax: [phone]  Return to Endocrinology clinic as below: Future Appointments  Date Time Provider Department Center  09/19/2019 10:10 AM Darci Lykins, Konrad Dolores, MD [id number] None

## 2019-06-14 NOTE — Patient Instructions (Signed)
-   STOP Basaglar - STOP Novolog - Continue Metformin 500 mg 2 tabs Twice daily  - Start Trulicity 2.26 mg once weekly  - Start Novolog Mix 30 units with First and Last meal of the day    - Check sugar before first and last meal of the day       - HOW TO TREAT LOW BLOOD SUGARS (Blood sugar LESS THAN 70 MG/DL)  Please follow the RULE OF 15 for the treatment of hypoglycemia treatment (when your (blood sugars are less than 70 mg/dL)    STEP 1: Take 15 grams of carbohydrates when your blood sugar is low, which includes:   3-4 GLUCOSE TABS  OR  3-4 OZ OF JUICE OR REGULAR SODA OR  ONE TUBE OF GLUCOSE GEL     STEP 2: RECHECK blood sugar in 15 MINUTES STEP 3: If your blood sugar is still low at the 15 minute recheck --> then, go back to STEP 1 and treat AGAIN with another 15 grams of carbohydrates.

## 2019-06-15 DIAGNOSIS — E781 Pure hyperglyceridemia: Secondary | ICD-10-CM | POA: Insufficient documentation

## 2019-06-15 DIAGNOSIS — E785 Hyperlipidemia, unspecified: Secondary | ICD-10-CM | POA: Insufficient documentation

## 2019-06-22 ENCOUNTER — Encounter: Payer: Self-pay | Admitting: Internal Medicine

## 2019-06-25 ENCOUNTER — Other Ambulatory Visit: Payer: Self-pay

## 2019-06-25 MED ORDER — "INSULIN SYRINGE 31G X 5/16"" 0.3 ML MISC": 6 refills | Status: DC

## 2019-08-10 ENCOUNTER — Telehealth (INDEPENDENT_AMBULATORY_CARE_PROVIDER_SITE_OTHER): Payer: 59 | Admitting: Family Medicine

## 2019-08-10 DIAGNOSIS — Z7189 Other specified counseling: Secondary | ICD-10-CM | POA: Diagnosis not present

## 2019-08-10 DIAGNOSIS — J069 Acute upper respiratory infection, unspecified: Secondary | ICD-10-CM

## 2019-08-10 DIAGNOSIS — R0989 Other specified symptoms and signs involving the circulatory and respiratory systems: Secondary | ICD-10-CM | POA: Diagnosis not present

## 2019-08-10 MED ORDER — AMOXICILLIN 500 MG PO TABS: 500.0000 mg | ORAL_TABLET | Freq: Two times a day (BID) | ORAL | 0 refills | Status: AC

## 2019-08-10 MED ORDER — FLUTICASONE PROPIONATE 50 MCG/ACT NA SUSP: 1.0000 | Freq: Every day | NASAL | 0 refills | Status: DC

## 2019-08-10 NOTE — Progress Notes (Signed)
Virtual Visit via Video Note  I connected with Julia Dickerson on 08/10/19 at 10:00 AM EST by a video enabled telemedicine application 2/2 [id number] pandemic and verified that I am speaking with the correct person using two identifiers.  Location patient: home Location provider:work or home office Persons participating in the virtual visit: patient, provider  I discussed the limitations of evaluation and management by telemedicine and the availability of in person appointments. The patient expressed understanding and agreed to proceed.   HPI: Pt is a 44 yo female with pmg sig for Afib on anticoagulation, HTN, DM II, OSA, HLD, PCOS, h/o depression, h/o tobacco use seen for acute concern.  Pt notes bad sinus HA on Tuesday.  She states they have been occurring off and on but happening more frequently.  Endorses frontal and ethmoid sinus pressure, HAs, sneezing, rhinorrhea, coughing off and on with nasal drainage, n/v.  Denies sore throat, loss of taste sick contacts.  Pt tried sudafed, hot tea, Emergen-C, cold and flu med for ppl with HTN.  Pt states she was started on a new injectable insulin by Endo.  Pt has extra pen needles that she can no longer use.  She also inquires about what to do with full sharps containers. ROS: See pertinent positives and negatives per HPI.  Past Medical History:  Diagnosis Date  . Anxiety   . Arthritis   . Atrial fibrillation (HCC)    a. paroxysmal, uses PRN Flecainide  . Depression   . Diabetes mellitus without complication ([facility])   . Dysrhythmia   . GERD (gastroesophageal reflux disease)   . Hypertension   . Obesity   . PCOS (polycystic ovarian syndrome)   . UTI (lower urinary tract infection)     Past Surgical History:  Procedure Laterality Date  . DILATION AND CURETTAGE OF UTERUS  2011 and 2012   x2  . GXT - Graded Exercise Tolerance Test  07/22/2016   Exercise time 6 minutes - 7 METs. Maximum heart rate 146 BPM. - No EKG changes. No arrhythmias or  chest pain.: Low risk test.  . TRANSTHORACIC ECHOCARDIOGRAM  06/2016   EF 6570% with mild LVH. Normal wall motion and diastolic function. Mild RAE. Otherwise normal.  . WISDOM TOOTH EXTRACTION      Family History  Problem Relation Age of Onset  . Heart failure Father   . Heart attack Father   . Cancer Father   . Diabetes Father   . Hearing loss Father   . Hyperlipidemia Father   . Hypertension Father   . Arthritis Mother   . Diabetes Mother   . COPD Maternal Grandmother   . Heart disease Maternal Grandmother   . Hyperlipidemia Maternal Grandmother   . Hyperlipidemia Maternal Grandfather   . Hypertension Maternal Grandfather   . Hearing loss Maternal Grandfather   . Alcohol abuse Maternal Grandfather   . Arthritis Maternal Grandfather   . Stroke Maternal Grandfather   . Hyperlipidemia Paternal Grandmother   . Miscarriages / Stillbirths Paternal Grandmother   . Kidney disease Paternal Grandfather   . Hypertension Paternal Grandfather   . Heart attack Paternal Grandfather   . Diabetes Paternal Grandfather   . Arthritis Sister      Current Outpatient Medications:  .  apixaban (ELIQUIS) 5 MG TABS tablet, TAKE 1 TABLET (5 MG TOTAL) 2 (TWO) TIMES DAILY BY MOUTH. (Patient not taking: Reported on 06/14/2019), Disp: 60 tablet, Rfl: 1 .  atorvastatin (LIPITOR) 40 MG tablet, TAKE 1 TABLET BY MOUTH EVERY  DAY, Disp: 90 tablet, Rfl: 2 .  diltiazem (CARDIZEM CD) 120 MG 24 hr capsule, Take 1 capsule (120 mg total) by mouth as needed. (Patient taking differently: Take 120 mg by mouth as needed (as needed). ), Disp: 90 capsule, Rfl: 1 .  diltiazem (CARDIZEM CD) 180 MG 24 hr capsule, Take 1 capsule (180 mg total) by mouth daily., Disp: 90 capsule, Rfl: 1 .  Dulaglutide (TRULICITY) 0.75 MG/0.5ML SOPN, Inject 0.75 mg into the skin once a week., Disp: 4 pen, Rfl: 6 .  DULoxetine (CYMBALTA) 30 MG capsule, Take 30 mg by mouth daily., Disp: , Rfl:  .  flecainide (TAMBOCOR) 100 MG tablet, Take 2  tablets (200 mg total) by mouth as needed. For A-fib (Patient taking differently: Take 200 mg by mouth as needed (as needed). For A-fib), Disp: 10 tablet, Rfl: 6 .  insulin lispro protamine-lispro (HUMALOG MIX 75/25) (75-25) 100 UNIT/ML SUSP injection, Inject 30 Units into the skin 2 (two) times daily with a meal., Disp: 15 mL, Rfl: 11 .  Insulin Pen Needle 32G X 4 MM MISC, Four times daily, Disp: 150 each, Rfl: 3 .  Insulin Syringe-Needle U-100 (INSULIN SYRINGE .3CC/31GX5/16") 31G X 5/16" 0.3 ML MISC, Use daily as instructed E11.40, Disp: 100 each, Rfl: 6 .  lisinopril (ZESTRIL) 10 MG tablet, Take 1 tablet (10 mg total) by mouth daily., Disp: 90 tablet, Rfl: 0 .  metFORMIN (GLUCOPHAGE-XR) 500 MG 24 hr tablet, Take 2 tablets (1,000 mg total) by mouth 2 (two) times daily with a meal., Disp: 120 tablet, Rfl: 11 .  Multiple Vitamin (MULTIVITAMIN WITH MINERALS) TABS tablet, Take 1 tablet by mouth daily., Disp: , Rfl:  .  omeprazole (PRILOSEC) 20 MG capsule, , Disp: , Rfl:   EXAM:  VITALS per patient if applicable: RR between 12-20 bpm  GENERAL: alert, oriented, appears well and in no acute distress  HEENT: atraumatic, conjunctiva clear, no obvious abnormalities on inspection of external nose and ears  NECK: normal movements of the head and neck  LUNGS: on inspection no signs of respiratory distress, breathing rate appears normal, no obvious gross SOB, gasping or wheezing  CV: no obvious cyanosis  MS: moves all visible extremities without noticeable abnormality  PSYCH/NEURO: pleasant and cooperative, no obvious depression or anxiety, speech and thought processing grossly intact  ASSESSMENT AND PLAN:  Discussed the following assessment and plan:  Upper respiratory tract infection, unspecified type -Discussed possibly viral such as acute nasal pharyngitis, influenza, cannot rule out Covid. -Supportive care -Given precautions -Self quarantine recommended -Covid testing encouraged -Plan:  Flonase  Sinus complaint -Increased pressure in ethmoid and frontal sinuses. -Given duration discussed wait-and-see Rx -For continued symptoms over the weekend patient to start amoxicillin 500 mg twice daily x7 days  Educated about COVID-19 virus infection -Discussed signs and symptoms -Given info on area Covid testing. -Patient to schedule appointment for testing today -Advised to self quarantine while awaiting results and recovering from current illness.  Follow-up prn   I discussed the assessment and treatment plan with the patient. The patient was provided an opportunity to ask questions and all were answered. The patient agreed with the plan and demonstrated an understanding of the instructions.   The patient was advised to call back or seek an in-person evaluation if the symptoms worsen or if the condition fails to improve as anticipated.   Julia Saint, MD

## 2019-09-04 ENCOUNTER — Other Ambulatory Visit: Payer: Self-pay | Admitting: Family Medicine

## 2019-09-04 DIAGNOSIS — J069 Acute upper respiratory infection, unspecified: Secondary | ICD-10-CM

## 2019-09-06 ENCOUNTER — Other Ambulatory Visit: Payer: Self-pay | Admitting: Student

## 2019-09-06 DIAGNOSIS — M48062 Spinal stenosis, lumbar region with neurogenic claudication: Secondary | ICD-10-CM

## 2019-09-19 ENCOUNTER — Ambulatory Visit: Payer: 59 | Admitting: Internal Medicine

## 2019-10-02 ENCOUNTER — Ambulatory Visit
Admission: RE | Admit: 2019-10-02 | Discharge: 2019-10-02 | Disposition: A | Discharge status: Home / Self Care | Payer: 59 | Source: Ambulatory Visit | Attending: Student | Admitting: Student

## 2019-10-02 ENCOUNTER — Other Ambulatory Visit: Payer: Self-pay

## 2019-10-02 DIAGNOSIS — M48062 Spinal stenosis, lumbar region with neurogenic claudication: Secondary | ICD-10-CM

## 2019-10-03 ENCOUNTER — Other Ambulatory Visit: Payer: Self-pay | Admitting: Family Medicine

## 2019-10-03 DIAGNOSIS — J069 Acute upper respiratory infection, unspecified: Secondary | ICD-10-CM

## 2019-10-08 ENCOUNTER — Encounter: Payer: Self-pay | Admitting: Internal Medicine

## 2019-10-08 ENCOUNTER — Other Ambulatory Visit: Payer: Self-pay

## 2019-10-08 ENCOUNTER — Ambulatory Visit: Payer: 59 | Admitting: Internal Medicine

## 2019-10-08 VITALS — BP 142/98 | HR 83 | Temp 98.7°F | Ht 67.0 in | Wt 258.6 lb

## 2019-10-08 DIAGNOSIS — Z794 Long term (current) use of insulin: Secondary | ICD-10-CM

## 2019-10-08 DIAGNOSIS — E785 Hyperlipidemia, unspecified: Secondary | ICD-10-CM

## 2019-10-08 DIAGNOSIS — E1140 Type 2 diabetes mellitus with diabetic neuropathy, unspecified: Secondary | ICD-10-CM | POA: Diagnosis not present

## 2019-10-08 LAB — POCT GLYCOSYLATED HEMOGLOBIN (HGB A1C): Hemoglobin A1C: 7.7 % — AB (ref 4.0–5.6)

## 2019-10-08 MED ORDER — TRULICITY 1.5 MG/0.5ML ~~LOC~~ SOAJ: 1.5000 mg | SUBCUTANEOUS | 11 refills | Status: DC

## 2019-10-08 MED ORDER — INSULIN PEN NEEDLE 32G X 4 MM MISC: 3 refills | Status: DC

## 2019-10-08 MED ORDER — METFORMIN HCL ER 500 MG PO TB24: 500.0000 mg | ORAL_TABLET | Freq: Two times a day (BID) | ORAL | 3 refills | Status: DC

## 2019-10-08 MED ORDER — INSULIN LISPRO PROT & LISPRO (75-25 MIX) 100 UNIT/ML KWIKPEN: 30.0000 [IU] | PEN_INJECTOR | Freq: Two times a day (BID) | SUBCUTANEOUS | 6 refills | Status: DC

## 2019-10-08 NOTE — Progress Notes (Signed)
Name: Julia Dickerson  Age/ Sex: 44 y.o., female   MRN/ DOB: [id number], 09/05/1975     PCP: Deeann Saint, MD   Reason for Endocrinology Evaluation: Type 2 diabetes Mellitus  Initial Endocrine Consultative Visit:  09/05/2018    PATIENT IDENTIFIER: Julia Dickerson is a 44 y.o. female with a past medical history of PCOS, T2DM, PAF and HTN.  The patient has followed with Endocrinology clinic since 09/05/2018 for consultative assistance with management of her diabetes.  DIABETIC HISTORY:  Julia Dickerson was diagnosed with T2DM since the 2000's.  She was initially on metformin, she was switched to MDI insulin regimen which helped control her hyperglycemia at the time, the patient was subsequently switched to oral glycemic agents in 2012 due to improved glucose control.  She was restarted on basal insulin in 2019 due to persistent hyperglycemia, she has tried Victoza in the past with severe nausea. Her hemoglobin A1c has ranged from 8.7% in in 2019, peaking at 10.7% in 08/14/2018.   Prandial insulin started in February 2020, in addition to continuing her metformin and basal insulin. Rybelsus is cost prohibitive   By 05/2019 we switched her MDI regimen to insulin mix, we started Trulicity, and continued metformin.  SUBJECTIVE:   During the last visit (06/14/2019): A1c 8.7%.  We stopped MDI regimen, and started NovoLog mix, as well as Trulicity, we continued metformin.    Today (10/08/2019): Julia Dickerson is here for a 3 month follow up on diabetes management. She checks glucose 1x a day, she denies any hypoglycemic episodes since her last clinic visit. She is doing better on the insulin mix. She is tolerating Trulicity but is having bloating and diarrhea issues that she attributes to metformin and would like to stop it.  Mother recently had CABG     ROS: As per HPI and as detailed below: Review of Systems  Gastrointestinal: Positive for diarrhea. Negative for nausea.       Bloated     Genitourinary: Negative for frequency.  Neurological: Positive for tingling.  Endo/Heme/Allergies: Negative for polydipsia.      HOME DIABETES REGIMEN:  NovoLog mix 30 units twice daily Trulicity 0.75 mg weekly Metformin 500 mg XR 2 tabs BID   DIABETIC COMPLICATIONS: Microvascular complications:   Neuropathy   Denies: CKD, retinopathy   Last eye exam: Completed 09/15/2018  Macrovascular complications:    Denies: CAD, PVD, CVA   GLUCOSE LOG:    HISTORY:  Past Medical History:  Past Medical History:  Diagnosis Date  . Anxiety   . Arthritis   . Atrial fibrillation (HCC)    a. paroxysmal, uses PRN Flecainide  . Depression   . Diabetes mellitus without complication (HCC)   . Dysrhythmia   . GERD (gastroesophageal reflux disease)   . Hypertension   . Obesity   . PCOS (polycystic ovarian syndrome)   . UTI (lower urinary tract infection)    Past Surgical History:  Past Surgical History:  Procedure Laterality Date  . DILATION AND CURETTAGE OF UTERUS  2011 and 2012   x2  . GXT - Graded Exercise Tolerance Test  07/22/2016   Exercise time 6 minutes - 7 METs. Maximum heart rate 146 BPM. - No EKG changes. No arrhythmias or chest pain.: Low risk test.  . TRANSTHORACIC ECHOCARDIOGRAM  06/2016   EF 6570% with mild LVH. Normal wall motion and diastolic function. Mild RAE. Otherwise normal.  . WISDOM TOOTH EXTRACTION      Social History:  reports  that she quit smoking about 13 months ago. Her smoking use included cigarettes and e-cigarettes. She has a 10.50 pack-year smoking history. She has never used smokeless tobacco. She reports current alcohol use. She reports that she does not use drugs. Family History:  Family History  Problem Relation Age of Onset  . Heart failure Father   . Heart attack Father   . Cancer Father   . Diabetes Father   . Hearing loss Father   . Hyperlipidemia Father   . Hypertension Father   . Arthritis Mother   . Diabetes Mother   . COPD  Maternal Grandmother   . Heart disease Maternal Grandmother   . Hyperlipidemia Maternal Grandmother   . Hyperlipidemia Maternal Grandfather   . Hypertension Maternal Grandfather   . Hearing loss Maternal Grandfather   . Alcohol abuse Maternal Grandfather   . Arthritis Maternal Grandfather   . Stroke Maternal Grandfather   . Hyperlipidemia Paternal Grandmother   . Miscarriages / Stillbirths Paternal Grandmother   . Kidney disease Paternal Grandfather   . Hypertension Paternal Grandfather   . Heart attack Paternal Grandfather   . Diabetes Paternal Grandfather   . Arthritis Sister      HOME MEDICATIONS: Allergies as of 10/08/2019      Reactions   Sulfa Antibiotics Hives      Medication List       Accurate as of October 08, 2019 10:04 AM. If you have any questions, ask your nurse or doctor.        STOP taking these medications   Trulicity 0.75 MG/0.5ML Sopn Generic drug: Dulaglutide Replaced by: Trulicity 1.5 MG/0.5ML Sopn Stopped by: Scarlette Shorts, MD     TAKE these medications   apixaban 5 MG Tabs tablet Commonly known as: Eliquis TAKE 1 TABLET (5 MG TOTAL) 2 (TWO) TIMES DAILY BY MOUTH.   atorvastatin 40 MG tablet Commonly known as: LIPITOR TAKE 1 TABLET BY MOUTH EVERY DAY   diltiazem 120 MG 24 hr capsule Commonly known as: CARDIZEM CD Take 1 capsule (120 mg total) by mouth as needed. What changed: reasons to take this   diltiazem 180 MG 24 hr capsule Commonly known as: CARDIZEM CD Take 1 capsule (180 mg total) by mouth daily. What changed: Another medication with the same name was changed. Make sure you understand how and when to take each.   DULoxetine 30 MG capsule Commonly known as: CYMBALTA Take 30 mg by mouth daily.   flecainide 100 MG tablet Commonly known as: TAMBOCOR Take 2 tablets (200 mg total) by mouth as needed. For A-fib What changed: reasons to take this   fluticasone 50 MCG/ACT nasal spray Commonly known as: FLONASE PLACE 1 SPRAY  INTO BOTH NOSTRILS DAILY.   HumaLOG Mix 75/25 (75-25) 100 UNIT/ML Susp injection Generic drug: insulin lispro protamine-lispro Inject 30 Units into the skin 2 (two) times daily with a meal.   Insulin Pen Needle 32G X 4 MM Misc Four times daily   INSULIN SYRINGE .3CC/31GX5/16" 31G X 5/16" 0.3 ML Misc Use daily as instructed E11.40   lisinopril 10 MG tablet Commonly known as: ZESTRIL Take 1 tablet (10 mg total) by mouth daily.   metFORMIN 500 MG 24 hr tablet Commonly known as: GLUCOPHAGE-XR Take 1 tablet (500 mg total) by mouth 2 (two) times daily with a meal. What changed: how much to take Changed by: Scarlette Shorts, MD   multivitamin with minerals Tabs tablet Take 1 tablet by mouth daily.   omeprazole 20  MG capsule Commonly known as: PRILOSEC   Trulicity 1.5 ER/1.5QM Sopn Generic drug: Dulaglutide Inject 1.5 mg into the skin once a week. Replaces: Trulicity 0.86 PY/1.9JK Sopn Started by: Dorita Sciara, MD        OBJECTIVE:   Vital Signs: BP (!) 142/98 (BP Location: Left Arm, Patient Position: Sitting, Cuff Size: Large)   Pulse 83   Temp 98.7 F (37.1 C)   Ht 5\' 7"  (1.702 m)   Wt 258 lb 9.6 oz (117.3 kg)   SpO2 98%   BMI 40.50 kg/m   Wt Readings from Last 3 Encounters:  10/08/19 258 lb 9.6 oz (117.3 kg)  06/14/19 262 lb (118.8 kg)  03/14/19 255 lb 9.6 oz (115.9 kg)     Exam: General: Pt appears well and is in NAD  Lungs: Clear with good BS bilat with no rales, rhonchi, or wheezes  Heart: RRR with normal S1 and S2 and no gallops; no murmurs; no rub  Abdomen: Normoactive bowel sounds, soft, nontender, without masses or organomegaly palpable  Extremities: No pretibial edema. No tremor.   Skin: Normal texture and temperature to palpation. No rash noted.  Neuro: MS is good with appropriate affect, pt is alert and Ox3        DM foot exam:10/08/2019 The skin of the feet is intact without sores or ulcerations. The pedal pulses are 2+ on right  and 2+ on left. The sensation is absent  to a screening 5.07, 10 gram monofilament bilaterally       DATA REVIEWED:  Lab Results  Component Value Date   HGBA1C 7.7 (A) 10/08/2019   HGBA1C 8.7 (A) 06/14/2019   HGBA1C 7.9 (A) 03/14/2019   ASSESSMENT / PLAN / RECOMMENDATIONS:   1) Type 2 Diabetes Mellitus,Improvecd glycemic control , With Neuropathic Complications - Most recent A1c of 7.7 %. Goal A1c < 7.0 %.    - Improved glycemic control   - She is intolerant to victoza in the past due to nausea. She  Has concerns about yeast infections with SGLT-2 inhibitors, we attempted  Rybelsus but the cost was prohibitive - She would like to stop the metformin due to chronic GI issues, ( prior to starting trulicity ) we discussed trying to cut it down by 50% for now and if still having issues 2 weeks in to the change, then she can gradually reduce the dose.  - Will increase trulicity as below  - Will no change insulin dose, as we are going down on metformin and BG's > 180 mg/dL.   MEDICATIONS:   - Decrease  Metformin 500 mg 1 tabs Twice daily  - Increase Trulicity 1.5 mg once weekly  - Continue  Humalog Mix 30 units with First and Last meal of the day    EDUCATION / INSTRUCTIONS:  BG monitoring instructions: Patient is instructed to check her blood sugars 4 times a day, fasting and bedtime.  Call [facility] if: BG persistently < 70 or > 300. . I reviewed the Rule of 15 for the treatment of hypoglycemia in detail with the patient. Literature supplied.  2. Dyslipidemia :   - Pt states compliance with statin use, mother recently had CABG. We again discussed the cardiovascular benefits.      F/U in 4 months   Signed electronically by: Mack Guise, MD  [facility]  [facility] [address] [place], [place] [address] Phone: [phone] FAX: [phone]   CC: Billie Ruddy,  MD [address]  [place] [address] Phone: [phone]  Fax: [phone]  Return to Endocrinology clinic as below: Future Appointments  Date Time Provider Department Center  11/06/2019  9:20 AM Marykay Lex, MD [facility]  02/11/2020  9:50 AM Moriah Shawley, Konrad Dolores, MD [facility] None

## 2019-10-08 NOTE — Patient Instructions (Signed)
-   Decrease Metformin 500 mg, 1 tablet Twice daily  - Increase Trulicity to 1.5  mg once weekly  - Continue Novolog Mix 30 units with First and Last meal of the day    - Check sugar before first and last meal of the day       - HOW TO TREAT LOW BLOOD SUGARS (Blood sugar LESS THAN 70 MG/DL)  Please follow the RULE OF 15 for the treatment of hypoglycemia treatment (when your (blood sugars are less than 70 mg/dL)    STEP 1: Take 15 grams of carbohydrates when your blood sugar is low, which includes:   3-4 GLUCOSE TABS  OR  3-4 OZ OF JUICE OR REGULAR SODA OR  ONE TUBE OF GLUCOSE GEL     STEP 2: RECHECK blood sugar in 15 MINUTES STEP 3: If your blood sugar is still low at the 15 minute recheck --> then, go back to STEP 1 and treat AGAIN with another 15 grams of carbohydrates.

## 2019-11-01 ENCOUNTER — Other Ambulatory Visit: Payer: Self-pay | Admitting: Family Medicine

## 2019-11-01 DIAGNOSIS — J069 Acute upper respiratory infection, unspecified: Secondary | ICD-10-CM

## 2019-11-06 ENCOUNTER — Encounter: Payer: Self-pay | Admitting: Cardiology

## 2019-11-06 ENCOUNTER — Ambulatory Visit (INDEPENDENT_AMBULATORY_CARE_PROVIDER_SITE_OTHER): Payer: 59 | Admitting: Cardiology

## 2019-11-06 ENCOUNTER — Other Ambulatory Visit: Payer: Self-pay

## 2019-11-06 VITALS — BP 128/86 | HR 86 | Ht 67.0 in | Wt 263.4 lb

## 2019-11-06 DIAGNOSIS — E1169 Type 2 diabetes mellitus with other specified complication: Secondary | ICD-10-CM | POA: Diagnosis not present

## 2019-11-06 DIAGNOSIS — I480 Paroxysmal atrial fibrillation: Secondary | ICD-10-CM | POA: Diagnosis not present

## 2019-11-06 DIAGNOSIS — Z87891 Personal history of nicotine dependence: Secondary | ICD-10-CM

## 2019-11-06 DIAGNOSIS — I10 Essential (primary) hypertension: Secondary | ICD-10-CM

## 2019-11-06 DIAGNOSIS — Z794 Long term (current) use of insulin: Secondary | ICD-10-CM

## 2019-11-06 DIAGNOSIS — E785 Hyperlipidemia, unspecified: Secondary | ICD-10-CM

## 2019-11-06 DIAGNOSIS — E1140 Type 2 diabetes mellitus with diabetic neuropathy, unspecified: Secondary | ICD-10-CM | POA: Diagnosis not present

## 2019-11-06 DIAGNOSIS — E781 Pure hyperglyceridemia: Secondary | ICD-10-CM

## 2019-11-06 MED ORDER — APIXABAN 5 MG PO TABS: ORAL_TABLET | ORAL | 3 refills | Status: DC

## 2019-11-06 NOTE — Patient Instructions (Signed)
Medication Instructions:  RESTART  ELIQUIS   *If you need a refill on your cardiac medications before your next appointment, please call your pharmacy*   Lab Work: NOT NEEDED    Testing/Procedures: NOT NEEDED   Follow-Up: At [facility], you and your health needs are our priority.  As part of our continuing mission to provide you with exceptional heart care, we have created designated Provider Care Teams.  These Care Teams include your primary Cardiologist (physician) and Advanced Practice Providers (APPs -  Physician Assistants and Nurse Practitioners) who all work together to provide you with the care you need, when you need it.    Your next appointment:   12 month(s)  The format for your next appointment:   In Person  Provider:   Bryan Lemma, MD   Other Instructions N/A CONGRATS ON YOU  STOPPING SMOKING

## 2019-11-06 NOTE — Progress Notes (Signed)
Primary Care Provider: Deeann Saint, MD Cardiologist: Bryan Lemma, MD Endocrinologist: Lonzo Cloud, Konrad Dolores, MD (as of February 2020)  Clinic Note: Chief Complaint  Patient presents with  . Follow-up    2.33 years  . Atrial Fibrillation    Has not had a recurrence since last visit  . Hypertension    Well-controlled    HPI:    Julia Dickerson is a 44 y.o. female with a history of PAF, PCOS, hypertension, DM-2 (with PN), and tobacco abuse below who presents today for 2 1/3 yr f/u.[name]  [name] was last seen in hospital follow-up on May 31, 2017 by Randall An, [place].  --> Patient had been admitted on October 22 with A. fib RVR. Had elevated white blood cell count. Did not convert with flecainide. However she did spontaneously convert prior to scheduled TEE cardioversion. -> Converted to p.o. diltiazem 100 mg daily and Eliquis 5 g twice daily.  Was hypertensive with BP 150/141. Clinic visit. On recheck 144/88 (taking blood pressure medications). No medication changes made.  Recent Hospitalizations:   Went to the emergency room with hyperglycemia in July 2020 otherwise none  Reviewed  CV studies:    The following studies were reviewed today: (if available, images/films reviewed: From Epic Chart or Care Everywhere)  No new studies  Interval History:   Julia Dickerson returns here today almost 2 and half years from the last visit doing fairly well.  Since I last saw her, she has some cigarettes last January (weaning off vaping) and has been started on CPAP for OSA.  She is doing well with that.  She says she is getting better sleep and her fatigue is improved but still has some. She has been under a lot of stress lately with her mother's health, that may explain some of her poor sleep.  Otherwise she is not had any symptoms of atrial fibrillation and her blood pressures been pretty well controlled.  Her sugars have been up and down and she had some  frequent urination and nocturia.  Interestingly, she ran out of her prescription for apixaban.  The other medications have been filled but never filled that.  As such, she just has been taking it for the better part of a year.  Thankfully, no breakthrough episodes of A. fib.  CV Review of Symptoms (Summary): no chest pain or dyspnea on exertion positive for - Just a little bit of fatigue and occasional dizziness, but nothing significant. negative for - edema, irregular heartbeat, orthopnea, palpitations, paroxysmal nocturnal dyspnea, rapid heart rate, shortness of breath or Syncope/near syncope, TIA/amaurosis fugax, claudication  The patient does not have symptoms concerning for COVID-19 infection (fever, chills, cough, or new shortness of breath).  The patient is practicing social distancing & Masking.   She has had her first of 2 COVID-19 vaccine injections with a second 1 scheduled in 2 weeks.  REVIEWED OF SYSTEMS   Review of Systems  Constitutional: Negative for malaise/fatigue (Energy level is doing fine, just not doing much) and weight loss.  HENT: Negative for ear discharge and nosebleeds.   Respiratory: Negative for hemoptysis and shortness of breath.   Gastrointestinal: Negative for blood in stool, constipation and melena.  Genitourinary: Positive for frequency (With blood sugars being high). Negative for hematuria.  Musculoskeletal: Negative for joint pain.  Neurological: Positive for dizziness (Sometimes if her sugars are out of control.). Negative for focal weakness and weakness.  Psychiatric/Behavioral: The patient has insomnia (Can have nights  with poor sleep, now with more stress traveling back and forth to [facility] for her mother.).    I have reviewed and (if needed) personally updated the patient's problem list, medications, allergies, past medical and surgical history, social and family history.   PAST MEDICAL HISTORY   Past Medical History:  Diagnosis Date  .  Anxiety   . Arthritis   . Atrial fibrillation (HCC)    a. paroxysmal, uses PRN Flecainide  . Depression   . Diabetes mellitus without complication (HCC)   . Dysrhythmia   . GERD (gastroesophageal reflux disease)   . Hypertension   . Obesity   . PCOS (polycystic ovarian syndrome)   . UTI (lower urinary tract infection)    New diagnosis of OSA-on CPAP  PAST SURGICAL HISTORY   Past Surgical History:  Procedure Laterality Date  . DILATION AND CURETTAGE OF UTERUS  2011 and 2012   x2  . GXT - Graded Exercise Tolerance Test  07/22/2016   Exercise time 6 minutes - 7 METs. Maximum heart rate 146 BPM. - No EKG changes. No arrhythmias or chest pain.: Low risk test.  . TRANSTHORACIC ECHOCARDIOGRAM  06/2016   EF 6570% with mild LVH. Normal wall motion and diastolic function. Mild RAE. Otherwise normal.  . WISDOM TOOTH EXTRACTION      MEDICATIONS/ALLERGIES   Current Meds  Medication Sig  . apixaban (ELIQUIS) 5 MG TABS tablet TAKE 1 TABLET (5 MG TOTAL) 2 (TWO) TIMES DAILY BY MOUTH.  [name] atorvastatin (LIPITOR) 40 MG tablet TAKE 1 TABLET BY MOUTH EVERY DAY  . diltiazem (CARDIZEM CD) 120 MG 24 hr capsule Take 1 capsule (120 mg total) by mouth as needed.  . diltiazem (CARDIZEM CD) 180 MG 24 hr capsule Take 1 capsule (180 mg total) by mouth daily.  . Dulaglutide (TRULICITY) 1.5 MG/0.5ML SOPN Inject 1.5 mg into the skin once a week.  . DULoxetine (CYMBALTA) 30 MG capsule Take 30 mg by mouth daily.  . flecainide (TAMBOCOR) 100 MG tablet Take 100 mg by mouth 2 (two) times daily. As needed  . fluticasone (FLONASE) 50 MCG/ACT nasal spray PLACE 1 SPRAY INTO BOTH NOSTRILS DAILY.  [name] Insulin Lispro Prot & Lispro (HUMALOG MIX 75/25 KWIKPEN) (75-25) 100 UNIT/ML Kwikpen Inject 30 Units into the skin 2 (two) times daily with a meal.  . Insulin Pen Needle 32G X 4 MM MISC Four times daily  . lisinopril (ZESTRIL) 10 MG tablet Take 1 tablet (10 mg total) by mouth daily.  . metFORMIN (GLUCOPHAGE-XR) 500 MG 24 hr  tablet Take 1 tablet (500 mg total) by mouth 2 (two) times daily with a meal.  . Multiple Vitamin (MULTIVITAMIN WITH MINERALS) TABS tablet Take 1 tablet by mouth daily.  [name] omeprazole (PRILOSEC) 20 MG capsule   . [DISCONTINUED] apixaban (ELIQUIS) 5 MG TABS tablet TAKE 1 TABLET (5 MG TOTAL) 2 (TWO) TIMES DAILY BY MOUTH.    Allergies  Allergen Reactions  . Sulfa Antibiotics Hives    SOCIAL HISTORY/FAMILY HISTORY   Reviewed in Epic: Pertinent new finding is that she quit cigarettes in January 2020-converted to vaping, but now weaning off that as well. -> 2 to 3 weeks after the initial Covid lockdown, she was notified that she would be working from home, in part because of her health concerns.  She has been working from home ever since. She has had a lot of issues with her mother's health (she lives in [place]) and has been in out-of-hospital with MI, recent CABG and now  anemia.  As a result, she never really got into much exercise.  She has been trying to.    Social History   Tobacco Use  . Smoking status: Former Smoker    Packs/day: 0.50    Years: 21.00    Pack years: 10.50    Types: Cigarettes, E-cigarettes    Quit date: 08/24/2018    Years since quitting: 1.2  . Smokeless tobacco: Never Used  . Tobacco comment: Has now started cutting back vaping as well.  Substance Use Topics  . Alcohol use: Yes    Comment: rare  . Drug use: No   family history includes Alcohol abuse in her maternal grandfather; Arthritis in her maternal grandfather, mother, and sister; COPD in her maternal grandmother; Cancer in her father; Diabetes in her father, mother, and paternal grandfather; Hearing loss in her father and maternal grandfather; Heart attack in her father and paternal grandfather; Heart disease in her maternal grandmother; Heart failure in her father; Hyperlipidemia in her father, maternal grandfather, maternal grandmother, and paternal grandmother; Hypertension in her father, maternal  grandfather, and paternal grandfather; Kidney disease in her paternal grandfather; Miscarriages / [place] in her paternal grandmother; Stroke in her maternal grandfather.  OBJCTIVE -PE, EKG, labs   Wt Readings from Last 3 Encounters:  11/06/19 263 lb 6.4 oz (119.5 kg)  10/08/19 258 lb 9.6 oz (117.3 kg)  06/14/19 262 lb (118.8 kg)    Physical Exam: BP 128/86   Pulse 86   Ht 5\' 7"  (1.702 m)   Wt 263 lb 6.4 oz (119.5 kg)   SpO2 96%   BMI 41.25 kg/m  Physical Exam  Constitutional: She is oriented to person, place, and time. She appears well-developed and well-nourished. No distress.  Healthy-appearing.  Well-groomed.  Remains morbidly obese  HENT:  Head: Normocephalic and atraumatic.  Neck: No hepatojugular reflux and no JVD present. Carotid bruit is not present.  Cardiovascular: Normal rate, regular rhythm, normal heart sounds and intact distal pulses.  No extrasystoles are present. PMI is not displaced (Difficult to palpate). Exam reveals no gallop and no friction rub.  No murmur heard. Pulmonary/Chest: Effort normal and breath sounds normal. No respiratory distress. She has no wheezes. She has no rales.  Abdominal: Soft. Bowel sounds are normal. She exhibits no distension. There is no abdominal tenderness.  Musculoskeletal:        General: No edema. Normal range of motion.     Cervical back: Normal range of motion and neck supple.  Neurological: She is alert and oriented to person, place, and time.  Psychiatric: She has a normal mood and affect. Her behavior is normal. Judgment and thought content normal.  Vitals reviewed.   Adult ECG Report  Rate: 86 ;  Rhythm: normal sinus rhythm and Septal MI, age undetermined.  Otherwise normal axis, intervals durations.;   Narrative Interpretation: Stable EKG.  Recent Labs:    Lab Results  Component Value Date   CHOL 178 06/14/2019   HDL 34.70 (L) 06/14/2019   LDLDIRECT 115.0 06/14/2019   TRIG 240.0 (H) 06/14/2019   CHOLHDL 5  06/14/2019   Lab Results  Component Value Date   CREATININE 0.74 06/14/2019   BUN 11 06/14/2019   NA 138 06/14/2019   K 4.4 06/14/2019   CL 101 06/14/2019   CO2 26 06/14/2019   Lab Results  Component Value Date   TSH 0.99 08/23/2018   Lab Results  Component Value Date   HGBA1C 7.7 (A) 10/08/2019  ASSESSMENT/PLAN    Problem List Items Addressed This Visit    Paroxysmal atrial fibrillation (HCC): CHA2DS2Vasc ~3 - Primary (Chronic)    On combination of rate/rhythm control with flecainide 100 mg twice daily along with diltiazem for rate control.  Woman with hypertension and diabetes-CHA2DS2-VASc score 3.  Remains on apixaban which apparently she ran out of and has not been taking for about a year now. -->  Will restart apixaban 5 mg twice daily.  Continue with current plan of diltiazem CD 180 mg plus flecainide 100 g twice daily.  She has the short acting diltiazem 120 mg for breakthrough episodes of A. fib along with additional dose of flecainide.  Thankfully, maintaining sinus rhythm.      Relevant Medications   flecainide (TAMBOCOR) 100 MG tablet   apixaban (ELIQUIS) 5 MG TABS tablet   Other Relevant Orders   EKG 12-Lead (Completed)   Essential hypertension (Chronic)   Relevant Medications   flecainide (TAMBOCOR) 100 MG tablet   apixaban (ELIQUIS) 5 MG TABS tablet   Former smoker (Chronic)    Congratulated her on her efforts.  I counseled her on actually fully stopping which means getting rid of vaping.      Dyslipidemia (Chronic)    On atorvastatin.  LDL is 115.  With her being a diabetic would like to see it less than 100.  Hopefully with better glycemic control, this will also improve.  A little more concerning and that her HDL is also below target.  Combination of high TG, DM and HTN plus obesity equals METABOLIC SYNDROME.  Low threshold to consider Vascepa if glycemic control is not improved triglycerides.      Hypertriglyceridemia (Chronic)    Probably  as much associated with hyperlipidemia as it is with hyperglycemia from diabetes.  She is on atorvastatin along with Trulicity, Metformin and lispro insulin.  May need to consider at a minimum fish oil, however Vascepa would be the best option.      Relevant Medications   flecainide (TAMBOCOR) 100 MG tablet   apixaban (ELIQUIS) 5 MG TABS tablet   Type 2 diabetes mellitus with diabetic neuropathy, unspecified (HCC)    Followed now by endocrinology. A1c is 7.7 which is better from from before, hopefully as this improves, her triglycerides will improve.  She is on dulaglutide along with Metformin and insulin.      Hyperlipidemia associated with type 2 diabetes mellitus (HCC)      COVID-19 Education: The signs and symptoms of COVID-19 were discussed with the patient and how to seek care for testing (follow up with PCP or arrange E-visit).   The importance of social distancing was discussed today.  I spent a total of with the patient. >  50% of the time was spent in direct patient consultation.  Additional time spent with chart review  / charting (studies, outside notes, etc): 8 Total Time: 26 min   Current medicines are reviewed at length with the patient today.  (+/- concerns) none  Notice: This dictation was prepared with Dragon dictation along with smaller phrase technology. Any transcriptional errors that result from this process are unintentional and may not be corrected upon review.  Patient Instructions / Medication Changes & Studies & Tests Ordered   Patient Instructions  Medication Instructions:  RESTART  ELIQUIS   *If you need a refill on your cardiac medications before your next appointment, please call your pharmacy*   Lab Work: NOT NEEDED    Testing/Procedures: NOT NEEDED  Follow-Up: At [facility], you and your health needs are our priority.  As part of our continuing mission to provide you with exceptional heart care, we have created  designated Provider Care Teams.  These Care Teams include your primary Cardiologist (physician) and Advanced Practice Providers (APPs -  Physician Assistants and Nurse Practitioners) who all work together to provide you with the care you need, when you need it.    Your next appointment:   12 month(s)  The format for your next appointment:   In Person  Provider:   Bryan Lemmaavid Husam Hohn, MD   Other Instructions N/A CONGRATS ON YOU  STOPPING SMOKING    Studies Ordered:   Orders Placed This Encounter  Procedures  . EKG 12-Lead     Bryan Lemmaavid Jaloni Sorber, M.D., M.S. Interventional Cardiologist   Pager # [phone] Phone # [phone] [address] [place], [place] [address]   Thank you for choosing [facility] at [facility]!!

## 2019-11-11 ENCOUNTER — Encounter: Payer: Self-pay | Admitting: Cardiology

## 2019-11-11 NOTE — Assessment & Plan Note (Signed)
On combination of rate/rhythm control with flecainide 100 mg twice daily along with diltiazem for rate control.  Woman with hypertension and diabetes-CHA2DS2-VASc score 3.  Remains on apixaban which apparently she ran out of and has not been taking for about a year now. -->  Will restart apixaban 5 mg twice daily.  Continue with current plan of diltiazem CD 180 mg plus flecainide 100 g twice daily.  She has the short acting diltiazem 120 mg for breakthrough episodes of A. fib along with additional dose of flecainide.  Thankfully, maintaining sinus rhythm.

## 2019-11-11 NOTE — Assessment & Plan Note (Signed)
Followed now by endocrinology. A1c is 7.7 which is better from from before, hopefully as this improves, her triglycerides will improve.  She is on dulaglutide along with Metformin and insulin.

## 2019-11-11 NOTE — Assessment & Plan Note (Signed)
Probably as much associated with hyperlipidemia as it is with hyperglycemia from diabetes.  She is on atorvastatin along with Trulicity, Metformin and lispro insulin.  May need to consider at a minimum fish oil, however Vascepa would be the best option.

## 2019-11-11 NOTE — Assessment & Plan Note (Signed)
On atorvastatin.  LDL is 115.  With her being a diabetic would like to see it less than 100.  Hopefully with better glycemic control, this will also improve.  A little more concerning and that her HDL is also below target.  Combination of high TG, DM and HTN plus obesity equals METABOLIC SYNDROME.  Low threshold to consider Vascepa if glycemic control is not improved triglycerides.

## 2019-11-11 NOTE — Assessment & Plan Note (Addendum)
Congratulated her on her efforts.  I counseled her on actually fully stopping which means getting rid of vaping.

## 2019-12-04 ENCOUNTER — Other Ambulatory Visit: Payer: Self-pay | Admitting: Internal Medicine

## 2019-12-04 DIAGNOSIS — Z794 Long term (current) use of insulin: Secondary | ICD-10-CM

## 2019-12-04 DIAGNOSIS — E1140 Type 2 diabetes mellitus with diabetic neuropathy, unspecified: Secondary | ICD-10-CM

## 2019-12-12 ENCOUNTER — Other Ambulatory Visit: Payer: Self-pay | Admitting: Student

## 2019-12-12 DIAGNOSIS — I480 Paroxysmal atrial fibrillation: Secondary | ICD-10-CM

## 2019-12-25 ENCOUNTER — Other Ambulatory Visit: Payer: Self-pay | Admitting: Family Medicine

## 2019-12-25 DIAGNOSIS — I10 Essential (primary) hypertension: Secondary | ICD-10-CM

## 2020-01-16 ENCOUNTER — Other Ambulatory Visit: Payer: Self-pay | Admitting: Family Medicine

## 2020-01-16 DIAGNOSIS — E785 Hyperlipidemia, unspecified: Secondary | ICD-10-CM

## 2020-01-16 DIAGNOSIS — E1169 Type 2 diabetes mellitus with other specified complication: Secondary | ICD-10-CM

## 2020-01-29 ENCOUNTER — Other Ambulatory Visit: Payer: Self-pay | Admitting: Family Medicine

## 2020-01-29 DIAGNOSIS — J069 Acute upper respiratory infection, unspecified: Secondary | ICD-10-CM

## 2020-02-11 ENCOUNTER — Encounter: Payer: Self-pay | Admitting: Internal Medicine

## 2020-02-11 ENCOUNTER — Ambulatory Visit: Payer: 59 | Admitting: Internal Medicine

## 2020-02-11 ENCOUNTER — Other Ambulatory Visit: Payer: Self-pay

## 2020-02-11 VITALS — BP 138/82 | HR 95 | Ht 67.0 in | Wt 250.0 lb

## 2020-02-11 DIAGNOSIS — Z794 Long term (current) use of insulin: Secondary | ICD-10-CM

## 2020-02-11 DIAGNOSIS — E1165 Type 2 diabetes mellitus with hyperglycemia: Secondary | ICD-10-CM

## 2020-02-11 DIAGNOSIS — E1140 Type 2 diabetes mellitus with diabetic neuropathy, unspecified: Secondary | ICD-10-CM | POA: Diagnosis not present

## 2020-02-11 LAB — POCT GLYCOSYLATED HEMOGLOBIN (HGB A1C): Hemoglobin A1C: 8.1 % — AB (ref 4.0–5.6)

## 2020-02-11 MED ORDER — INSULIN LISPRO PROT & LISPRO (75-25 MIX) 100 UNIT/ML KWIKPEN: PEN_INJECTOR | SUBCUTANEOUS | 6 refills | Status: DC

## 2020-02-11 NOTE — Patient Instructions (Signed)
-   Continue  Metformin 500 mg, 1 tablet Twice daily  - Continue  Trulicity 1.5  mg once weekly  - Increase Novolog Mix 32 units with First and 36 units with Supper         - HOW TO TREAT LOW BLOOD SUGARS (Blood sugar LESS THAN 70 MG/DL)  Please follow the RULE OF 15 for the treatment of hypoglycemia treatment (when your (blood sugars are less than 70 mg/dL)    STEP 1: Take 15 grams of carbohydrates when your blood sugar is low, which includes:   3-4 GLUCOSE TABS  OR  3-4 OZ OF JUICE OR REGULAR SODA OR  ONE TUBE OF GLUCOSE GEL     STEP 2: RECHECK blood sugar in 15 MINUTES STEP 3: If your blood sugar is still low at the 15 minute recheck --> then, go back to STEP 1 and treat AGAIN with another 15 grams of carbohydrates.

## 2020-02-11 NOTE — Progress Notes (Signed)
Name: Tmya Wigington  Age/ Sex: 44 y.o., female   MRN/ DOB: [id number], Mar 03, 1976     PCP: Deeann Saint, MD   Reason for Endocrinology Evaluation: Type 2 diabetes Mellitus  Initial Endocrine Consultative Visit:  09/05/2018    PATIENT IDENTIFIER: [name] is a 44 y.o. female with a past medical history of PCOS, T2DM, PAF and HTN.  The patient has followed with Endocrinology clinic since 09/05/2018 for consultative assistance with management of her diabetes.  DIABETIC HISTORY:  Ms. Tranchina was diagnosed with T2DM since the 2000's.  She was initially on metformin, she was switched to MDI insulin regimen which helped control her hyperglycemia at the time, the patient was subsequently switched to oral glycemic agents in 2012 due to improved glucose control.  She was restarted on basal insulin in 2019 due to persistent hyperglycemia, she has tried Victoza in the past with severe nausea. Her hemoglobin A1c has ranged from 8.7% in in 2019, peaking at 10.7% in 08/14/2018.   Prandial insulin started in February 2020, in addition to continuing her metformin and basal insulin. Rybelsus is cost prohibitive   By 05/2019 we switched her MDI regimen to insulin mix, we started Trulicity, and continued metformin.  SUBJECTIVE:   During the last visit (10/08/2019): A1c 7.7%.  Reduced metformin dose due to GI side effects. Increased trulicity and continued humalog Mix.    Today (02/11/2020): Ms. Winborne is here for a 3 month follow up on diabetes management. She checks glucose 1x a day, she denies any hypoglycemic episodes since her last clinic visit. She is doing better on the insulin mix. She is tolerating Trulicity but is having bloating and diarrhea issues that she attributes to metformin and would like to stop it.    HOME DIABETES REGIMEN:  NovoLog mix 30 units twice daily Trulicity 1.5 mg weekly Metformin 500 mg XR 1 tab BID      DIABETIC COMPLICATIONS: Microvascular  complications:   Neuropathy   Denies: CKD, retinopathy   Last eye exam: Completed 09/15/2018  Macrovascular complications:    Denies: CAD, PVD, CVA   GLUCOSE LOG: 115-214 mg/dL    HISTORY:  Past Medical History:  Past Medical History:  Diagnosis Date  . Anxiety   . Arthritis   . Atrial fibrillation (HCC)    a. paroxysmal, uses PRN Flecainide  . Depression   . Diabetes mellitus without complication (HCC)   . Dysrhythmia   . GERD (gastroesophageal reflux disease)   . Hypertension   . Obesity   . PCOS (polycystic ovarian syndrome)   . UTI (lower urinary tract infection)    Past Surgical History:  Past Surgical History:  Procedure Laterality Date  . DILATION AND CURETTAGE OF UTERUS  2011 and 2012   x2  . GXT - Graded Exercise Tolerance Test  07/22/2016   Exercise time 6 minutes - 7 METs. Maximum heart rate 146 BPM. - No EKG changes. No arrhythmias or chest pain.: Low risk test.  . TRANSTHORACIC ECHOCARDIOGRAM  06/2016   EF 6570% with mild LVH. Normal wall motion and diastolic function. Mild RAE. Otherwise normal.  . WISDOM TOOTH EXTRACTION      Social History:  reports that she quit smoking about 17 months ago. Her smoking use included cigarettes and e-cigarettes. She has a 10.50 pack-year smoking history. She has never used smokeless tobacco. She reports current alcohol use. She reports that she does not use drugs. Family History:  Family History  Problem Relation Age  of Onset  . Heart failure Father   . Heart attack Father   . Cancer Father   . Diabetes Father   . Hearing loss Father   . Hyperlipidemia Father   . Hypertension Father   . Arthritis Mother   . Diabetes Mother   . COPD Maternal Grandmother   . Heart disease Maternal Grandmother   . Hyperlipidemia Maternal Grandmother   . Hyperlipidemia Maternal Grandfather   . Hypertension Maternal Grandfather   . Hearing loss Maternal Grandfather   . Alcohol abuse Maternal Grandfather   . Arthritis  Maternal Grandfather   . Stroke Maternal Grandfather   . Hyperlipidemia Paternal Grandmother   . Miscarriages / Stillbirths Paternal Grandmother   . Kidney disease Paternal Grandfather   . Hypertension Paternal Grandfather   . Heart attack Paternal Grandfather   . Diabetes Paternal Grandfather   . Arthritis Sister      HOME MEDICATIONS: Allergies as of 02/11/2020      Reactions   Sulfa Antibiotics Hives      Medication List       Accurate as of February 11, 2020 10:12 AM. If you have any questions, ask your nurse or doctor.        apixaban 5 MG Tabs tablet Commonly known as: Eliquis TAKE 1 TABLET (5 MG TOTAL) 2 (TWO) TIMES DAILY BY MOUTH.   atorvastatin 40 MG tablet Commonly known as: LIPITOR TAKE 1 TABLET BY MOUTH EVERY DAY   diltiazem 120 MG 24 hr capsule Commonly known as: CARDIZEM CD Take 1 capsule (120 mg total) by mouth as needed.   diltiazem 180 MG 24 hr capsule Commonly known as: CARDIZEM CD TAKE 1 CAPSULE BY MOUTH EVERY DAY   DULoxetine 30 MG capsule Commonly known as: CYMBALTA Take 30 mg by mouth daily.   flecainide 100 MG tablet Commonly known as: TAMBOCOR Take 100 mg by mouth 2 (two) times daily. As needed   fluticasone 50 MCG/ACT nasal spray Commonly known as: FLONASE PLACE 1 SPRAY INTO BOTH NOSTRILS DAILY.   Insulin Lispro Prot & Lispro (75-25) 100 UNIT/ML Kwikpen Commonly known as: HumaLOG Mix 75/25 KwikPen Inject 32 Units into the skin daily with breakfast AND 36 Units daily with supper. What changed: See the new instructions. Changed by: Scarlette Shorts, MD   Insulin Pen Needle 32G X 4 MM Misc Four times daily   lisinopril 10 MG tablet Commonly known as: ZESTRIL TAKE 1 TABLET BY MOUTH EVERY DAY   metFORMIN 500 MG 24 hr tablet Commonly known as: GLUCOPHAGE-XR Take 1 tablet (500 mg total) by mouth 2 (two) times daily with a meal.   multivitamin with minerals Tabs tablet Take 1 tablet by mouth daily.   omeprazole 20 MG  capsule Commonly known as: PRILOSEC   Trulicity 1.5 MG/0.5ML Sopn Generic drug: Dulaglutide Inject 1.5 mg into the skin once a week.        OBJECTIVE:   Vital Signs: BP 138/82 (BP Location: Left Arm, Patient Position: Sitting, Cuff Size: Large)   Pulse 95   Ht 5\' 7"  (1.702 m)   Wt 250 lb (113.4 kg)   SpO2 98%   BMI 39.16 kg/m   Wt Readings from Last 3 Encounters:  02/11/20 250 lb (113.4 kg)  11/06/19 263 lb 6.4 oz (119.5 kg)  10/08/19 258 lb 9.6 oz (117.3 kg)     Exam: General: Pt appears well and is in NAD  Lungs: Clear with good BS bilat with no rales, rhonchi, or wheezes  Heart: RRR with normal S1 and S2 and no gallops; no murmurs; no rub  Abdomen: Normoactive bowel sounds, soft, nontender, without masses or organomegaly palpable  Extremities: No pretibial edema. No tremor.   Skin: Normal texture and temperature to palpation. No rash noted.  Neuro: MS is good with appropriate affect, pt is alert and Ox3        DM foot exam:10/08/2019 The skin of the feet is intact without sores or ulcerations. The pedal pulses are 2+ on right and 2+ on left. The sensation is absent  to a screening 5.07, 10 gram monofilament bilaterally       DATA REVIEWED:  Lab Results  Component Value Date   HGBA1C 8.1 (A) 02/11/2020   HGBA1C 7.7 (A) 10/08/2019   HGBA1C 8.7 (A) 06/14/2019   ASSESSMENT / PLAN / RECOMMENDATIONS:   1) Type 2 Diabetes Mellitus,Poorly controlled , With Neuropathic Complications - Most recent A1c of 8.1 %. Goal A1c < 7.0 %.    - Slight worsening of A1c , up from 7.7%  - GI symptoms improved with 50% decrease dose of metformin - She opted for increasing insulin rather than trulicity at this time. We again discussed low carb diet.   - She is intolerant to victoza in the past due to nausea. She  Has concerns about yeast infections with SGLT-2 inhibitors, we attempted  Rybelsus but the cost was prohibitive   MEDICATIONS:  - Continue Metformin 500 mg 1 tabs  Twice daily  - Continue Trulicity 1.5 mg once weekly  - Increase  Humalog Mix 32 units with Breakfast and 36 units with Supper    EDUCATION / INSTRUCTIONS:  BG monitoring instructions: Patient is instructed to check her blood sugars 4 times a day, fasting and bedtime.  Call  Endocrinology clinic if: BG persistently < 70 or > 300. . I reviewed the Rule of 15 for the treatment of hypoglycemia in detail with the patient. Literature supplied.    F/U in 6 months   Signed electronically by: Lyndle Herrlich, MD  [facility] Endocrinology  [facility] [address] [place] [place], [place] [address] Phone: [phone] FAX: [phone]   CC: Deeann Saint, MD [address] [place] [address] Phone: [phone]  Fax: [phone]  Return to Endocrinology clinic as below: Future Appointments  Date Time Provider Department Center  08/18/2020  9:50 AM Amaiya Scruton, Konrad Dolores, MD [id number] None

## 2020-03-01 ENCOUNTER — Other Ambulatory Visit: Payer: Self-pay | Admitting: Family Medicine

## 2020-03-01 DIAGNOSIS — I10 Essential (primary) hypertension: Secondary | ICD-10-CM

## 2020-03-03 MED ORDER — LISINOPRIL 10 MG PO TABS: 10.0000 mg | ORAL_TABLET | Freq: Every day | ORAL | 0 refills | Status: DC

## 2020-05-03 ENCOUNTER — Other Ambulatory Visit: Payer: Self-pay

## 2020-05-03 ENCOUNTER — Encounter: Payer: Self-pay | Admitting: Emergency Medicine

## 2020-05-03 ENCOUNTER — Ambulatory Visit
Admission: EM | Admit: 2020-05-03 | Discharge: 2020-05-03 | Disposition: A | Discharge status: Home / Self Care | Payer: 59

## 2020-05-03 DIAGNOSIS — S90121A Contusion of right lesser toe(s) without damage to nail, initial encounter: Secondary | ICD-10-CM

## 2020-05-03 NOTE — Discharge Instructions (Signed)
Keep clean and dry. Follow up with podiatry in 1-2 weeks. Return for worsening pain, redness, swelling, fever, malodor.

## 2020-05-03 NOTE — ED Provider Notes (Signed)
[name]    CSN: [id number] Arrival date & time: 05/03/20  1207      History   Chief Complaint Chief Complaint  Patient presents with  . Toe Pain    HPI Julia Dickerson is a 44 y.o. female  Presenting for evaluation of right second toe nail discoloration.  Has been seen by her PCP for this: Told it was a hematoma.  Denies pain, though endorses neuropathy.  Denies injury, discharge, fever.  Past Medical History:  Diagnosis Date  . Anxiety   . Arthritis   . Atrial fibrillation (HCC)    a. paroxysmal, uses PRN Flecainide  . Depression   . Diabetes mellitus without complication (HCC)   . Dysrhythmia   . GERD (gastroesophageal reflux disease)   . Hypertension   . Obesity   . PCOS (polycystic ovarian syndrome)   . UTI (lower urinary tract infection)     Patient Active Problem List   Diagnosis Date Noted  . Type 2 diabetes mellitus with hyperglycemia, with long-term current use of insulin (HCC) 02/11/2020  . Dyslipidemia 06/15/2019  . Hypertriglyceridemia 06/15/2019  . OSA (obstructive sleep apnea) 10/05/2018  . Weight loss 10/05/2018  . Former smoker 08/07/2018  . PCOS (polycystic ovarian syndrome) 08/03/2018  . Chronic diarrhea 09/19/2017  . Neuropathic pain 09/19/2017  . Type 2 diabetes mellitus with diabetic neuropathy, unspecified (HCC) 08/19/2017  . Hyperlipidemia associated with type 2 diabetes mellitus (HCC) 08/19/2017  . Tobacco use disorder 08/19/2017  . Current use of long term anticoagulation 05/31/2017  . Abnormal EKG 06/24/2016  . Paroxysmal atrial fibrillation (HCC): CHA2DS2Vasc ~3 06/23/2016  . Essential hypertension 06/23/2016  . Major depression, recurrent (HCC) 07/10/2014    Past Surgical History:  Procedure Laterality Date  . DILATION AND CURETTAGE OF UTERUS  2011 and 2012   x2  . GXT - Graded Exercise Tolerance Test  07/22/2016   Exercise time 6 minutes - 7 METs. Maximum heart rate 146 BPM. - No EKG changes. No arrhythmias  or chest pain.: Low risk test.  . TRANSTHORACIC ECHOCARDIOGRAM  06/2016   EF 6570% with mild LVH. Normal wall motion and diastolic function. Mild RAE. Otherwise normal.  . WISDOM TOOTH EXTRACTION      OB History    Gravida  2   Para      Term      Preterm      AB  2   Living        SAB      TAB      Ectopic      Multiple      Live Births               Home Medications    Prior to Admission medications   Medication Sig Start Date End Date Taking? Authorizing Provider  apixaban (ELIQUIS) 5 MG TABS tablet TAKE 1 TABLET (5 MG TOTAL) 2 (TWO) TIMES DAILY BY MOUTH. 11/06/19   Marykay Lex, MD  atorvastatin (LIPITOR) 40 MG tablet TAKE 1 TABLET BY MOUTH EVERY DAY 01/16/20   [place], Betty G, MD  diltiazem (CARDIZEM CD) 120 MG 24 hr capsule Take 1 capsule (120 mg total) by mouth as needed. 01/19/18   Marykay Lex, MD  diltiazem (CARDIZEM CD) 180 MG 24 hr capsule TAKE 1 CAPSULE BY MOUTH EVERY DAY 12/12/19   Marykay Lex, MD  Dulaglutide (TRULICITY) 1.5 MG/0.5ML SOPN Inject 1.5 mg into the skin once a week. 10/08/19   Shamleffer, Brent Bulla  Gust Brooms, MD  DULoxetine (CYMBALTA) 30 MG capsule Take 30 mg by mouth daily.    [provider]  flecainide (TAMBOCOR) 100 MG tablet Take 100 mg by mouth 2 (two) times daily. As needed    [provider]  fluticasone (FLONASE) 50 MCG/ACT nasal spray PLACE 1 SPRAY INTO BOTH NOSTRILS DAILY. 01/29/20   Deeann Saint, MD  Insulin Lispro Prot & Lispro (HUMALOG MIX 75/25 KWIKPEN) (75-25) 100 UNIT/ML Kwikpen Inject 32 Units into the skin daily with breakfast AND 36 Units daily with supper. 02/11/20   Shamleffer, Konrad Dolores, MD  Insulin Pen Needle 32G X 4 MM MISC Four times daily 10/08/19   Shamleffer, Konrad Dolores, MD  lisinopril (ZESTRIL) 10 MG tablet Take 1 tablet (10 mg total) by mouth daily. 03/03/20   Deeann Saint, MD  metFORMIN (GLUCOPHAGE-XR) 500 MG 24 hr tablet Take 1 tablet (500 mg total) by mouth 2 (two) times  daily with a meal. 10/08/19   Shamleffer, Konrad Dolores, MD  Multiple Vitamin (MULTIVITAMIN WITH MINERALS) TABS tablet Take 1 tablet by mouth daily.    [provider]  omeprazole (PRILOSEC) 20 MG capsule  02/09/10   [provider]    Family History Family History  Problem Relation Age of Onset  . Heart failure Father   . Heart attack Father   . Cancer Father   . Diabetes Father   . Hearing loss Father   . Hyperlipidemia Father   . Hypertension Father   . Arthritis Mother   . Diabetes Mother   . COPD Maternal Grandmother   . Heart disease Maternal Grandmother   . Hyperlipidemia Maternal Grandmother   . Hyperlipidemia Maternal Grandfather   . Hypertension Maternal Grandfather   . Hearing loss Maternal Grandfather   . Alcohol abuse Maternal Grandfather   . Arthritis Maternal Grandfather   . Stroke Maternal Grandfather   . Hyperlipidemia Paternal Grandmother   . Miscarriages / Stillbirths Paternal Grandmother   . Kidney disease Paternal Grandfather   . Hypertension Paternal Grandfather   . Heart attack Paternal Grandfather   . Diabetes Paternal Grandfather   . Arthritis Sister     Social History Social History   Tobacco Use  . Smoking status: Former Smoker    Packs/day: 0.50    Years: 21.00    Pack years: 10.50    Types: Cigarettes, E-cigarettes    Quit date: 08/24/2018    Years since quitting: 1.6  . Smokeless tobacco: Never Used  . Tobacco comment: Has now started cutting back vaping as well.  Vaping Use  . Vaping Use: Never used  Substance Use Topics  . Alcohol use: Yes    Comment: rare  . Drug use: No     Allergies   Sulfa antibiotics   Review of Systems As per HPI   Physical Exam Triage Vital Signs ED Triage Vitals  Enc Vitals Group     BP 05/03/20 1230 (!) 165/106     Pulse Rate 05/03/20 1230 99     Resp 05/03/20 1230 18     Temp 05/03/20 1230 98.4 F (36.9 C)     Temp Source 05/03/20 1230 Oral     SpO2 05/03/20 1230 96 %      Weight --      Height --      Head Circumference --      Peak Flow --      Pain Score 05/03/20 1231 0     Pain Loc --  Pain Edu? --      Excl. in GC? --    No data found.  Updated Vital Signs BP (!) 165/106 (BP Location: Left Arm)   Pulse 99   Temp 98.4 F (36.9 C) (Oral)   Resp 18   SpO2 96%   Visual Acuity Right Eye Distance:   Left Eye Distance:   Bilateral Distance:    Right Eye Near:   Left Eye Near:    Bilateral Near:     Physical Exam Constitutional:      General: She is not in acute distress. HENT:     Head: Normocephalic and atraumatic.  Eyes:     General: No scleral icterus.    Pupils: Pupils are equal, round, and reactive to light.  Cardiovascular:     Rate and Rhythm: Normal rate.  Pulmonary:     Effort: Pulmonary effort is normal.  Musculoskeletal:        General: No swelling or tenderness. Normal range of motion.  Skin:    Coloration: Skin is not jaundiced or pale.     Comments: Right second toenail intact, though resolving hematoma underneath.  No d/c  Neurological:     Mental Status: She is alert and oriented to person, place, and time.      UC Treatments / Results  Labs (all labs ordered are listed, but only abnormal results are displayed) Labs Reviewed - No data to display  EKG   Radiology No results found.  Procedures Procedures (including critical care time)  Medications Ordered in UC Medications - No data to display  Initial Impression / Assessment and Plan / UC Course  I have reviewed the triage vital signs and the nursing notes.  Pertinent labs & imaging results that were available during my care of the patient were reviewed by me and considered in my medical decision making (see chart for details).     No sign of infectious process.  Will follow up with podiatry given history of diabetes and neuropathy.  Return precautions discussed, pt verbalized understanding and is agreeable to plan. Final Clinical  Impressions(s) / UC Diagnoses   Final diagnoses:  Hematoma of toe of right foot, initial encounter     Discharge Instructions     Keep clean and dry. Follow up with podiatry in 1-2 weeks. Return for worsening pain, redness, swelling, fever, malodor.    ED Prescriptions    None     PDMP not reviewed this encounter.   Hall[place], [place], [place] 05/03/20 1425

## 2020-05-03 NOTE — ED Triage Notes (Signed)
Pt sts right second toe nail issue; pt sts last night was trying to come off; pt would like eval

## 2020-05-31 ENCOUNTER — Other Ambulatory Visit: Payer: Self-pay | Admitting: Family Medicine

## 2020-05-31 DIAGNOSIS — I10 Essential (primary) hypertension: Secondary | ICD-10-CM

## 2020-06-22 ENCOUNTER — Other Ambulatory Visit: Payer: Self-pay | Admitting: Internal Medicine

## 2020-06-23 NOTE — Telephone Encounter (Signed)
Message left for patient to return my call. Need to ask vial or pen?

## 2020-06-24 MED ORDER — INSULIN LISPRO PROT & LISPRO (75-25 MIX) 100 UNIT/ML KWIKPEN
PEN_INJECTOR | SUBCUTANEOUS | 6 refills | Status: DC
Start: 2020-06-24 — End: 2020-08-18

## 2020-06-24 NOTE — Telephone Encounter (Signed)
Spoken to patient and confirm that she wanted the Humalog kwikpen not the vital.  Sent as requested.

## 2020-06-24 NOTE — Telephone Encounter (Signed)
Message left for patient to return my call.  

## 2020-07-28 ENCOUNTER — Observation Stay (HOSPITAL_COMMUNITY)
Admission: EM | Admit: 2020-07-28 | Discharge: 2020-07-28 | Disposition: A | Discharge status: Home / Self Care | Payer: 59 | Attending: Emergency Medicine | Admitting: Emergency Medicine

## 2020-07-28 ENCOUNTER — Emergency Department (HOSPITAL_COMMUNITY): Payer: 59

## 2020-07-28 ENCOUNTER — Encounter (HOSPITAL_COMMUNITY): Payer: Self-pay | Admitting: Emergency Medicine

## 2020-07-28 ENCOUNTER — Other Ambulatory Visit: Payer: Self-pay

## 2020-07-28 DIAGNOSIS — E1165 Type 2 diabetes mellitus with hyperglycemia: Secondary | ICD-10-CM | POA: Diagnosis not present

## 2020-07-28 DIAGNOSIS — Z794 Long term (current) use of insulin: Secondary | ICD-10-CM | POA: Insufficient documentation

## 2020-07-28 DIAGNOSIS — I480 Paroxysmal atrial fibrillation: Principal | ICD-10-CM | POA: Diagnosis present

## 2020-07-28 DIAGNOSIS — I10 Essential (primary) hypertension: Secondary | ICD-10-CM | POA: Insufficient documentation

## 2020-07-28 DIAGNOSIS — Z79899 Other long term (current) drug therapy: Secondary | ICD-10-CM | POA: Diagnosis not present

## 2020-07-28 DIAGNOSIS — Z87891 Personal history of nicotine dependence: Secondary | ICD-10-CM | POA: Diagnosis not present

## 2020-07-28 DIAGNOSIS — I4891 Unspecified atrial fibrillation: Secondary | ICD-10-CM | POA: Diagnosis present

## 2020-07-28 DIAGNOSIS — G4733 Obstructive sleep apnea (adult) (pediatric): Secondary | ICD-10-CM | POA: Diagnosis not present

## 2020-07-28 DIAGNOSIS — Z7984 Long term (current) use of oral hypoglycemic drugs: Secondary | ICD-10-CM | POA: Insufficient documentation

## 2020-07-28 DIAGNOSIS — R002 Palpitations: Secondary | ICD-10-CM | POA: Diagnosis present

## 2020-07-28 DIAGNOSIS — Z20822 Contact with and (suspected) exposure to covid-19: Secondary | ICD-10-CM | POA: Diagnosis not present

## 2020-07-28 DIAGNOSIS — Z7901 Long term (current) use of anticoagulants: Secondary | ICD-10-CM | POA: Insufficient documentation

## 2020-07-28 LAB — CBC
HCT: 46.5 % — ABNORMAL HIGH (ref 36.0–46.0)
Hemoglobin: 16.6 g/dL — ABNORMAL HIGH (ref 12.0–15.0)
MCH: 31.7 pg (ref 26.0–34.0)
MCHC: 35.7 g/dL (ref 30.0–36.0)
MCV: 88.9 fL (ref 80.0–100.0)
Platelets: 378 10*3/uL (ref 150–400)
RBC: 5.23 MIL/uL — ABNORMAL HIGH (ref 3.87–5.11)
RDW: 13.3 % (ref 11.5–15.5)
WBC: 11.1 10*3/uL — ABNORMAL HIGH (ref 4.0–10.5)
nRBC: 0 % (ref 0.0–0.2)

## 2020-07-28 LAB — BASIC METABOLIC PANEL
Anion gap: 15 (ref 5–15)
BUN: 13 mg/dL (ref 6–20)
CO2: 19 mmol/L — ABNORMAL LOW (ref 22–32)
Calcium: 10.1 mg/dL (ref 8.9–10.3)
Chloride: 102 mmol/L (ref 98–111)
Creatinine, Ser: 0.78 mg/dL (ref 0.44–1.00)
GFR, Estimated: >60 mL/min (ref >60)
Glucose, Bld: 346 mg/dL — ABNORMAL HIGH (ref 70–99)
Potassium: 4.2 mmol/L (ref 3.5–5.1)
Sodium: 136 mmol/L (ref 135–145)

## 2020-07-28 LAB — URINALYSIS, ROUTINE W REFLEX MICROSCOPIC
Bacteria, UA: NONE SEEN
Bilirubin Urine: NEGATIVE
Glucose, UA: >=500 mg/dL — AB
Hgb urine dipstick: NEGATIVE
Ketones, ur: NEGATIVE mg/dL
Leukocytes,Ua: NEGATIVE
Nitrite: NEGATIVE
Protein, ur: NEGATIVE mg/dL
Specific Gravity, Urine: 1.013 (ref 1.005–1.030)
pH: 5 (ref 5.0–8.0)

## 2020-07-28 LAB — HEMOGLOBIN A1C
Hgb A1c MFr Bld: 8.6 % — ABNORMAL HIGH (ref 4.8–5.6)
Mean Plasma Glucose: 200.12 mg/dL

## 2020-07-28 LAB — RAPID URINE DRUG SCREEN, HOSP PERFORMED
Amphetamines: NOT DETECTED
Barbiturates: NOT DETECTED
Benzodiazepines: NOT DETECTED
Cocaine: NOT DETECTED
Opiates: NOT DETECTED
Tetrahydrocannabinol: NOT DETECTED

## 2020-07-28 LAB — HIV ANTIBODY (ROUTINE TESTING W REFLEX): HIV Screen 4th Generation wRfx: NONREACTIVE

## 2020-07-28 LAB — RESP PANEL BY RT-PCR (FLU A&B, COVID) ARPGX2
Influenza A by PCR: NEGATIVE
Influenza B by PCR: NEGATIVE
SARS Coronavirus 2 by RT PCR: NEGATIVE

## 2020-07-28 LAB — CBG MONITORING, ED
Glucose-Capillary: 216 mg/dL — ABNORMAL HIGH (ref 70–99)
Glucose-Capillary: 209 mg/dL — ABNORMAL HIGH (ref 70–99)

## 2020-07-28 LAB — TROPONIN I (HIGH SENSITIVITY)
Troponin I (High Sensitivity): 11 ng/L (ref <18)
Troponin I (High Sensitivity): 13 ng/L (ref <18)

## 2020-07-28 LAB — I-STAT BETA HCG BLOOD, ED (MC, WL, AP ONLY): I-stat hCG, quantitative: <5 m[IU]/mL (ref <5)

## 2020-07-28 LAB — TSH: TSH: 2.639 u[IU]/mL (ref 0.350–4.500)

## 2020-07-28 LAB — MAGNESIUM: Magnesium: 1.9 mg/dL (ref 1.7–2.4)

## 2020-07-28 MED ORDER — ONDANSETRON HCL 4 MG/2ML IJ SOLN: 4.0000 mg | Freq: Four times a day (QID) | INTRAMUSCULAR | Status: DC | PRN

## 2020-07-28 MED ORDER — APIXABAN 5 MG PO TABS
5.0000 mg | ORAL_TABLET | Freq: Two times a day (BID) | ORAL | Status: DC
  Administered 2020-07-28: 5 mg via ORAL
  Filled 2020-07-28: qty 1

## 2020-07-28 MED ORDER — INSULIN ASPART PROT & ASPART (70-30 MIX) 100 UNIT/ML ~~LOC~~ SUSP
35.0000 [IU] | Freq: Two times a day (BID) | SUBCUTANEOUS | Status: DC
  Filled 2020-07-28: qty 10

## 2020-07-28 MED ORDER — SODIUM CHLORIDE 0.9 % IV BOLUS
1000.0000 mL | Freq: Once | INTRAVENOUS | Status: AC
  Administered 2020-07-28: 1000 mL via INTRAVENOUS

## 2020-07-28 MED ORDER — DILTIAZEM HCL ER COATED BEADS 120 MG PO CP24: 240.0000 mg | ORAL_CAPSULE | Freq: Every day | ORAL | 0 refills | Status: DC

## 2020-07-28 MED ORDER — DILTIAZEM LOAD VIA INFUSION
20.0000 mg | Freq: Once | INTRAVENOUS | Status: AC
  Administered 2020-07-28: 20 mg via INTRAVENOUS
  Filled 2020-07-28: qty 20

## 2020-07-28 MED ORDER — INSULIN ASPART 100 UNIT/ML ~~LOC~~ SOLN: 10.0000 [IU] | Freq: Once | SUBCUTANEOUS | Status: DC

## 2020-07-28 MED ORDER — METFORMIN HCL ER 500 MG PO TB24
500.0000 mg | ORAL_TABLET | Freq: Two times a day (BID) | ORAL | Status: DC
  Administered 2020-07-28: 500 mg via ORAL
  Filled 2020-07-28 (×2): qty 1

## 2020-07-28 MED ORDER — INSULIN ASPART 100 UNIT/ML ~~LOC~~ SOLN
10.0000 [IU] | Freq: Once | SUBCUTANEOUS | Status: AC
  Administered 2020-07-28: 10 [IU] via SUBCUTANEOUS

## 2020-07-28 MED ORDER — ATORVASTATIN CALCIUM 40 MG PO TABS
40.0000 mg | ORAL_TABLET | Freq: Every day | ORAL | Status: DC
  Administered 2020-07-28: 40 mg via ORAL
  Filled 2020-07-28: qty 1

## 2020-07-28 MED ORDER — INSULIN ASPART 100 UNIT/ML ~~LOC~~ SOLN
0.0000 [IU] | Freq: Three times a day (TID) | SUBCUTANEOUS | Status: DC
  Administered 2020-07-28: 5 [IU] via SUBCUTANEOUS

## 2020-07-28 MED ORDER — DILTIAZEM HCL 25 MG/5ML IV SOLN
10.0000 mg | Freq: Once | INTRAVENOUS | Status: AC
  Administered 2020-07-28: 10 mg via INTRAVENOUS
  Filled 2020-07-28: qty 5

## 2020-07-28 MED ORDER — ETOMIDATE 2 MG/ML IV SOLN
INTRAVENOUS | Status: AC | PRN
  Administered 2020-07-28: 10 mg via INTRAVENOUS

## 2020-07-28 MED ORDER — DILTIAZEM HCL-DEXTROSE 125-5 MG/125ML-% IV SOLN (PREMIX)
5.0000 mg/h | INTRAVENOUS | Status: DC
  Administered 2020-07-28: 5 mg/h via INTRAVENOUS
  Filled 2020-07-28: qty 125

## 2020-07-28 MED ORDER — PANTOPRAZOLE SODIUM 40 MG PO TBEC
40.0000 mg | DELAYED_RELEASE_TABLET | Freq: Every day | ORAL | Status: DC
  Administered 2020-07-28: 40 mg via ORAL
  Filled 2020-07-28: qty 1

## 2020-07-28 MED ORDER — ACETAMINOPHEN 325 MG PO TABS: 650.0000 mg | ORAL_TABLET | ORAL | Status: DC | PRN

## 2020-07-28 MED ORDER — DULOXETINE HCL 30 MG PO CPEP
30.0000 mg | ORAL_CAPSULE | Freq: Every day | ORAL | Status: DC
  Filled 2020-07-28: qty 1

## 2020-07-28 MED ORDER — FLUTICASONE PROPIONATE 50 MCG/ACT NA SUSP
1.0000 | Freq: Every day | NASAL | Status: DC
  Filled 2020-07-28 (×2): qty 16

## 2020-07-28 MED ORDER — SODIUM CHLORIDE 0.9 % IV SOLN: INTRAVENOUS | Status: DC

## 2020-07-28 MED ORDER — DULOXETINE HCL 30 MG PO CPEP
30.0000 mg | ORAL_CAPSULE | Freq: Every day | ORAL | Status: DC
  Administered 2020-07-28: 30 mg via ORAL
  Filled 2020-07-28: qty 1

## 2020-07-28 MED ORDER — ETOMIDATE 2 MG/ML IV SOLN
10.0000 mg | Freq: Once | INTRAVENOUS | Status: DC
  Filled 2020-07-28: qty 10

## 2020-07-28 MED ORDER — FLECAINIDE ACETATE 100 MG PO TABS: 100.0000 mg | ORAL_TABLET | Freq: Two times a day (BID) | ORAL | 0 refills | Status: DC | PRN

## 2020-07-28 MED ORDER — ATORVASTATIN CALCIUM 40 MG PO TABS: 40.0000 mg | ORAL_TABLET | Freq: Every day | ORAL | Status: DC

## 2020-07-28 MED ORDER — METOPROLOL TARTRATE 5 MG/5ML IV SOLN
5.0000 mg | Freq: Four times a day (QID) | INTRAVENOUS | Status: DC | PRN
  Administered 2020-07-28: 5 mg via INTRAVENOUS
  Filled 2020-07-28: qty 5

## 2020-07-28 MED ORDER — LISINOPRIL 10 MG PO TABS
10.0000 mg | ORAL_TABLET | Freq: Every day | ORAL | Status: DC
  Administered 2020-07-28: 10 mg via ORAL
  Filled 2020-07-28: qty 1

## 2020-07-28 NOTE — Consult Note (Addendum)
Cardiology Consultation:   Patient ID: Julia Dickerson MRN: [id number]; DOB: 02-Aug-1975  Admit date: 07/28/2020 Date of Consult: 07/28/2020  Primary Care Provider: Deeann Saint, MD [facility] Cardiologist: Julia Lemma, MD   Patient Profile:   Julia Dickerson is a 45 y.o. female with a hx of paroxysmal atrial fibrillation, hypertension, hyperlipidemia, diabetes mellitus, OSA on CPAP and prior tobacco smoking who is being seen today for the evaluation of atrial fibrillation at the request of Julia Dickerson.   Initially dx with afib in 2017.  She has been treated with Cardizem 180 mg daily and flecainide PRN.  Last seen by Julia Dickerson April 2021.  At that time she was run out of Eliquis which was restarted.  History of Present Illness:   Julia Dickerson woke up this morning at 2 AM with sudden onset of palpitation.  This was her first episode after initial diagnosis of atrial fibrillation.  Patient reports compliance with her Eliquis.  Patient stop using her CPAP about 2 months ago due to sinus issue.  Patient reported some left arm pain with initial palpitation episode.  Exposed to COVID-19 3 weeks ago.  Had some congestion at that time but now resolved.  Covid negative here.  Cardiology is asked for further evaluation due to persistent elevation of heart rate despite IV Cardizem 10 mg x 1 and then started on drip.  Denies orthopnea, PND, syncope, dizziness, melena or blood in her stool or urine.  High-sensitivity troponin negative x 2.    Past Medical History:  Diagnosis Date   Anxiety    Arthritis    Atrial fibrillation (HCC)    a. paroxysmal, uses PRN Flecainide   Depression    Diabetes mellitus without complication (HCC)    Dysrhythmia    GERD (gastroesophageal reflux disease)    Hypertension    Obesity    PCOS (polycystic ovarian syndrome)    UTI (lower urinary tract infection)     Past Surgical History:  Procedure Laterality Date   DILATION AND CURETTAGE OF UTERUS  2011  and 2012   x2   GXT - Graded Exercise Tolerance Test  07/22/2016   Exercise time 6 minutes - 7 METs. Maximum heart rate 146 BPM. - No EKG changes. No arrhythmias or chest pain.: Low risk test.   TRANSTHORACIC ECHOCARDIOGRAM  06/2016   EF 6570% with mild LVH. Normal wall motion and diastolic function. Mild RAE. Otherwise normal.   WISDOM TOOTH EXTRACTION       Inpatient Medications: Scheduled Meds:  apixaban  5 mg Oral BID   atorvastatin  40 mg Oral Daily   DULoxetine  30 mg Oral Daily   fluticasone  1 spray Each Nare Daily   insulin aspart  0-15 Units Subcutaneous TID WC   Insulin Lispro Prot & Lispro  35 Units Subcutaneous BID AC   lisinopril  10 mg Oral Daily   metFORMIN  500 mg Oral BID WC   pantoprazole  40 mg Oral Daily   Continuous Infusions:  sodium chloride     diltiazem (CARDIZEM) infusion 13 mg/hr (07/28/20 1322)   PRN Meds: acetaminophen, ondansetron (ZOFRAN) IV  Allergies:    Allergies  Allergen Reactions   Sulfa Antibiotics Hives    Social History:   Social History   Socioeconomic History   Marital status: Married    Spouse name: Not on file   Number of children: 0   Years of education: Not on file   Highest education level: Not on  file  Occupational History   Not on file  Tobacco Use   Smoking status: Former Smoker    Packs/day: 0.50    Years: 21.00    Pack years: 10.50    Types: Cigarettes, E-cigarettes    Quit date: 08/24/2018    Years since quitting: 1.9   Smokeless tobacco: Never Used   Tobacco comment: Has now started cutting back vaping as well.  Vaping Use   Vaping Use: Never used  Substance and Sexual Activity   Alcohol use: Yes    Comment: rare   Drug use: No   Sexual activity: Yes    Partners: Male    Birth control/protection: None    Comment: 1st intercourse- 75, partners- 8, married- 17 yrs   Other Topics Concern   Not on file  Social History Narrative   Not on file   Social Determinants of Health   Financial Resource  Strain: Not on file  Food Insecurity: Not on file  Transportation Needs: Not on file  Physical Activity: Not on file  Stress: Not on file  Social Connections: Not on file  Intimate Partner Violence: Not on file    Family History:   Family History  Problem Relation Age of Onset   Heart failure Father    Heart attack Father    Cancer Father    Diabetes Father    Hearing loss Father    Hyperlipidemia Father    Hypertension Father    Arthritis Mother    Diabetes Mother    COPD Maternal Grandmother    Heart disease Maternal Grandmother    Hyperlipidemia Maternal Grandmother    Hyperlipidemia Maternal Grandfather    Hypertension Maternal Grandfather    Hearing loss Maternal Grandfather    Alcohol abuse Maternal Grandfather    Arthritis Maternal Grandfather    Stroke Maternal Grandfather    Hyperlipidemia Paternal Grandmother    Miscarriages / Stillbirths Paternal Grandmother    Kidney disease Paternal Grandfather    Hypertension Paternal Grandfather    Heart attack Paternal Grandfather    Diabetes Paternal Grandfather    Arthritis Sister      ROS:  Please see the history of present illness.  All other ROS reviewed and negative.     Physical Exam/Data:   Vitals:   07/28/20 1230 07/28/20 1245 07/28/20 1300 07/28/20 1315  BP: (!) 147/98     Pulse: (!) 141 99 (!) 147 (!) 151  Resp: 13 (!) 23 19 19   Temp:      TempSrc:      SpO2: 99% 99% 98% 98%  Weight:      Height:       No intake or output data in the 24 hours ending 07/28/20 1401 Last 3 Weights 07/28/2020 02/11/2020 11/06/2019  Weight (lbs) 257 lb 15 oz 250 lb 263 lb 6.4 oz  Weight (kg) 117 kg 113.399 kg 119.477 kg  Some encounter information is confidential and restricted. Go to Review Flowsheets activity to see all data.     Body mass index is 40.4 kg/m.  General:  Well nourished, well developed, in no acute distress HEENT: normal Lymph: no adenopathy Neck: no JVD Endocrine:  No thryomegaly Vascular: No  carotid bruits; FA pulses 2+ bilaterally without bruits  Cardiac:  normal S1, S2; irregularly irregular tachycardic; no murmur Lungs:  clear to auscultation bilaterally, no wheezing, rhonchi or rales  Abd: soft, nontender, no hepatomegaly  Ext: no edema Musculoskeletal:  No deformities, BUE and BLE strength normal and  equal Skin: warm and dry  Neuro:  CNs 2-12 intact, no focal abnormalities noted Psych:  Normal affect   EKG:  The EKG was personally reviewed and demonstrates: Atrial fibrillation at rate of 162 bpm, ST upsloping in lateral leads Telemetry:  Telemetry was personally reviewed and demonstrates: Atrial fibrillation at rate of 140-160 bpm  Relevant CV Studies:  ETT 06/2016 Stress Findings  ECG Baseline ECG exhibits normal sinus rhythm..  Stress Findings The patient exercised following the Bruce protocol.   The patient reported shortness of breath during the stress test.   The patient requested the test to be stopped. The test was stopped because  the patient complained of fatigue. Patient stated that she would not be able to hold on to treadmill bar any longer so requested to stop the treadmill immediately. THR was not achieved.   Recovery time:  6 minutes.  Response to Stress There was no ST segment deviation noted during stress.  Arrhythmias during stress:  none.   Arrhythmias during recovery:  none.     There were no significant arrhythmias noted during the test.   ECG was interpretable.    Stress Measurements  Baseline Vitals  Rest HR 94 bpm    Rest BP 162/103 mmHg    Exercise Time  Exercise duration (min) 6 min    Exercise duration (sec) 0 sec    Peak Stress Vitals  Peak HR 146 bpm    Peak BP 170/102 mmHg    Exercise Data  MPHR 181 bpm    Percent HR 80 %    RPE 17     Estimated workload 7 METS         Echo 06/2016 Left ventricle: The cavity size was normal. Wall thickness was    increased in a pattern of mild LVH. Systolic function was     vigorous. The estimated ejection fraction was in the range of 65%    to 70%. Wall motion was normal; there were no regional wall    motion abnormalities. Left ventricular diastolic function    parameters were normal.  - Mitral valve: Mildly thickened leaflets . There was trivial    regurgitation.  - Left atrium: The atrium was normal in size.  - Right atrium: The atrium was mildly dilated.  - Inferior vena cava: The vessel was normal in size. The    respirophasic diameter changes were in the normal range (>= 50%),    consistent with normal central venous pressure.   Impressions:   - LVEF 65-70%, mild LVH, normal wall motion, normal diastolic    function, mild RAE, normal LA size, normal IVC.   Laboratory Data:  High Sensitivity Troponin:   Recent Labs  Lab 07/28/20 0632 07/28/20 0833  TROPONINIHS 11 13     Chemistry Recent Labs  Lab 07/28/20 0632  NA 136  K 4.2  CL 102  CO2 19*  GLUCOSE 346*  BUN 13  CREATININE 0.78  CALCIUM 10.1  GFRNONAA >60  ANIONGAP 15    No results for input(s): PROT, ALBUMIN, AST, ALT, ALKPHOS, BILITOT in the last 168 hours. Hematology Recent Labs  Lab 07/28/20 0632  WBC 11.1*  RBC 5.23*  HGB 16.6*  HCT 46.5*  MCV 88.9  MCH 31.7  MCHC 35.7  RDW 13.3  PLT 378   BNPNo results for input(s): BNP, PROBNP in the last 168 hours.  DDimer No results for input(s): DDIMER in the last 168 hours.   Radiology/Studies:  [facility] Chest Port 1  View  Result Date: 07/28/2020 CLINICAL DATA:  Chest pain and shortness of breath. EXAM: PORTABLE CHEST 1 VIEW COMPARISON:  06/13/2018 FINDINGS: 0656 hours. The lungs are clear without focal pneumonia, edema, pneumothorax or pleural effusion. Cardiopericardial silhouette is at upper limits of normal for size. The visualized bony structures of the thorax show no acute abnormality. Telemetry leads overlie the chest. IMPRESSION: No active disease. Electronically Signed   By: Misty Stanley M.D.   On: 07/28/2020 07:10      Assessment and Plan:   Atrial Fibrillation with rapid ventricular rate -She went into atrial fibrillation this morning at 2 AM.  Patient reports this is her first episode since 2017.  She reports compliance with Eliquis.  She was prescribed flecainide as needed but never required to use it.  Currently she does not have prescription per patient.   -Current episode could be due to noncompliance with CPAP or recent exposure to Covid (currently asymptomatic and Covid negative in ER) -EF was normal in 2017 -Patient is a good candidate for ER cardioversion per protocol.  Discussed with ED provider. -Increase Cardizem CD to 240 mg daily and give Rx for PRN Flecainide 100mg  for breakthrough episode of atrial fibrillation -Continue Eliquis 5 mg twice daily.  Discussed importance of compliance with patient.  No bleeding issue. -Close follow-up in atrial fibrillation clinic on 07/31/20 @ 8:30am { CHA2DS2-VASc Score = 3  This indicates a 3.2% annual risk of stroke. The patient's score is based upon: CHF History: No HTN History: Yes Diabetes History: Yes Stroke History: No Vascular Disease History: No Age Score: 0 Gender Score: 1   2.  Diabetes mellitus for hyperglycemia -Recommended close follow-up with PCP  3.  Obstructive sleep apnea -Recommended compliance.  She may need different mask.   For questions or updates, please contact [place] Please consult [email or web address] for contact info under    Jarrett Soho, PA  07/28/2020 2:01 PM  Agree with note by Robbie Lis PA-C  We are asked to see [name] for PAF.  Her last episode was in 2018.  She has had a negative GXT in the past and normal 2D echo.  She does have sleep apnea on CPAP until recently as well as diabetes and hypertension.  She is awakened at 2 AM with tachypalpitations.  When she came into the hospital she was in A. fib with RVR.  She is on Eliquis oral anticoagulation which she has taken religiously without  interruption.  Her heart rate is somewhat improved since being in the emergency room.  Exam is benign.  She is a great candidate for DC cardioversion by the ER physician, discharged home with early follow-up in the A. fib clinic.  We will increase her p.o. Cardizem from CD 180 to CD 240 mg a day.  Lorretta Harp, M.D., [place], [facility], Laverta Dickerson [place] [address]. [place], [place]  [address]  [phone] 07/28/2020 3:04 PM

## 2020-07-28 NOTE — ED Notes (Signed)
RN called pharmacist to verify medications

## 2020-07-28 NOTE — ED Triage Notes (Signed)
Pt c/o palpitations and feeling "off" since 2000 last night. Pt also report shob, L arm pain intermittent since last night. Diaphoretic in triage.

## 2020-07-28 NOTE — ED Provider Notes (Signed)
[facility] EMERGENCY DEPARTMENT Provider Note   CSN: [id number] Arrival date & time: 07/28/20  0962     History Chief Complaint  Patient presents with  . Palpitations    Julia Dickerson is a 45 y.o. female vascular history significant for A. fib on Eliquis, type 2 diabetes, hypertension, obesity.  Have Covid vaccinations. Cardiologist is Dr. Herbie Baltimore.  LVEF 65 to 70% in 2017.  HPI Patient presents to emergency room today with chief complaint of progressively worsening shortness of breath and chest pain x 3 hours. Patient states around 7 pm last night, approximately 10 hours prior to arrival, she was feeling off which she describes as generally unwell. She tried to go to bed, however when shortness of breath and chest pain started she came to the ED. She described the chest pain as located in the center of her chest. Pain was sharp and did not radiate. Pain was constant until she was in exam room. She rated pain 4/10 in severity. She states her shortness of breath has been constant. She endorses associated diaphoresis and left arm pain as well as urinary frequency sine symptom onset.  She admits to compliance with her medications. Took her eliquis and diltiazem this morning prior to arrival. She had prn prescription for flecainide although has not needed to take it in x 4 years so never refilled it. She states she had a close covid exposure and her quarantine period ended 07/17/20. She denies any significant increase in  caffeine intake, recent exercise, or drug use. Denies any recent medication changes or antibiotic use. Denies chills, congestion, back pain, abdominal pain, nausea, emesis, gross hematuria, dysuria, diarrhea.      Past Medical History:  Diagnosis Date  . Anxiety   . Arthritis   . Atrial fibrillation (HCC)    a. paroxysmal, uses PRN Flecainide  . Depression   . Diabetes mellitus without complication (HCC)   . Dysrhythmia   . GERD (gastroesophageal  reflux disease)   . Hypertension   . Obesity   . PCOS (polycystic ovarian syndrome)   . UTI (lower urinary tract infection)     Patient Active Problem List   Diagnosis Date Noted  . Type 2 diabetes mellitus with hyperglycemia, with long-term current use of insulin (HCC) 02/11/2020  . Dyslipidemia 06/15/2019  . Hypertriglyceridemia 06/15/2019  . OSA (obstructive sleep apnea) 10/05/2018  . Weight loss 10/05/2018  . Former smoker 08/07/2018  . PCOS (polycystic ovarian syndrome) 08/03/2018  . Chronic diarrhea 09/19/2017  . Neuropathic pain 09/19/2017  . Type 2 diabetes mellitus with diabetic neuropathy, unspecified (HCC) 08/19/2017  . Hyperlipidemia associated with type 2 diabetes mellitus (HCC) 08/19/2017  . Tobacco use disorder 08/19/2017  . Current use of long term anticoagulation 05/31/2017  . Abnormal EKG 06/24/2016  . Paroxysmal atrial fibrillation (HCC): CHA2DS2Vasc ~3 06/23/2016  . Essential hypertension 06/23/2016  . Major depression, recurrent (HCC) 07/10/2014    Past Surgical History:  Procedure Laterality Date  . DILATION AND CURETTAGE OF UTERUS  2011 and 2012   x2  . GXT - Graded Exercise Tolerance Test  07/22/2016   Exercise time 6 minutes - 7 METs. Maximum heart rate 146 BPM. - No EKG changes. No arrhythmias or chest pain.: Low risk test.  . TRANSTHORACIC ECHOCARDIOGRAM  06/2016   EF 6570% with mild LVH. Normal wall motion and diastolic function. Mild RAE. Otherwise normal.  . WISDOM TOOTH EXTRACTION       OB History    [place]  2   Para      Term      Preterm      AB  2   Living        SAB      IAB      Ectopic      Multiple      Live Births              Family History  Problem Relation Age of Onset  . Heart failure Father   . Heart attack Father   . Cancer Father   . Diabetes Father   . Hearing loss Father   . Hyperlipidemia Father   . Hypertension Father   . Arthritis Mother   . Diabetes Mother   . COPD Maternal Grandmother    . Heart disease Maternal Grandmother   . Hyperlipidemia Maternal Grandmother   . Hyperlipidemia Maternal Grandfather   . Hypertension Maternal Grandfather   . Hearing loss Maternal Grandfather   . Alcohol abuse Maternal Grandfather   . Arthritis Maternal Grandfather   . Stroke Maternal Grandfather   . Hyperlipidemia Paternal Grandmother   . Miscarriages / Stillbirths Paternal Grandmother   . Kidney disease Paternal Grandfather   . Hypertension Paternal Grandfather   . Heart attack Paternal Grandfather   . Diabetes Paternal Grandfather   . Arthritis Sister     Social History   Tobacco Use  . Smoking status: Former Smoker    Packs/day: 0.50    Years: 21.00    Pack years: 10.50    Types: Cigarettes, E-cigarettes    Quit date: 08/24/2018    Years since quitting: 1.9  . Smokeless tobacco: Never Used  . Tobacco comment: Has now started cutting back vaping as well.  Vaping Use  . Vaping Use: Never used  Substance Use Topics  . Alcohol use: Yes    Comment: rare  . Drug use: No    Home Medications Prior to Admission medications   Medication Sig Start Date End Date Taking? Authorizing Provider  apixaban (ELIQUIS) 5 MG TABS tablet TAKE 1 TABLET (5 MG TOTAL) 2 (TWO) TIMES DAILY BY MOUTH. 11/06/19   Leonie Man, MD  atorvastatin (LIPITOR) 40 MG tablet TAKE 1 TABLET BY MOUTH EVERY DAY 01/16/20   Martinique, Betty G, MD  diltiazem (CARDIZEM CD) 120 MG 24 hr capsule Take 1 capsule (120 mg total) by mouth as needed. 01/19/18   Leonie Man, MD  diltiazem (CARDIZEM CD) 180 MG 24 hr capsule TAKE 1 CAPSULE BY MOUTH EVERY DAY 12/12/19   Leonie Man, MD  Dulaglutide (TRULICITY) 1.5 UX/3.2TF SOPN Inject 1.5 mg into the skin once a week. 10/08/19   Shamleffer, Melanie Crazier, MD  DULoxetine (CYMBALTA) 30 MG capsule Take 30 mg by mouth daily.    [provider]  flecainide (TAMBOCOR) 100 MG tablet Take 100 mg by mouth 2 (two) times daily. As needed    [provider]   fluticasone (FLONASE) 50 MCG/ACT nasal spray PLACE 1 SPRAY INTO BOTH NOSTRILS DAILY. 01/29/20   Billie Ruddy, MD  Insulin Lispro Prot & Lispro (HUMALOG MIX 75/25 KWIKPEN) (75-25) 100 UNIT/ML Kwikpen Inject 32 Units into the skin daily with breakfast AND 36 Units daily with supper. 06/24/20   Shamleffer, Melanie Crazier, MD  Insulin Pen Needle 32G X 4 MM MISC Four times daily 10/08/19   Shamleffer, Melanie Crazier, MD  lisinopril (ZESTRIL) 10 MG tablet TAKE 1 TABLET BY MOUTH EVERY DAY 06/02/20   Volanda Napoleon,  Bettey Mare, MD  metFORMIN (GLUCOPHAGE-XR) 500 MG 24 hr tablet Take 1 tablet (500 mg total) by mouth 2 (two) times daily with a meal. 10/08/19   Shamleffer, Konrad Dolores, MD  Multiple Vitamin (MULTIVITAMIN WITH MINERALS) TABS tablet Take 1 tablet by mouth daily.    [provider]  omeprazole (PRILOSEC) 20 MG capsule  02/09/10   [provider]    Allergies    Sulfa antibiotics  Review of Systems   Review of Systems All other systems are reviewed and are negative for acute change except as noted in the HPI.  Physical Exam Updated Vital Signs BP (!) 128/105 (BP Location: Left Arm)   Pulse (!) 170   Temp 97.9 F (36.6 C) (Oral)   Resp 18   Ht 5\' 7"  (1.702 m)   Wt 117 kg   SpO2 100%   BMI 40.40 kg/m   Physical Exam Vitals and nursing note reviewed.  Constitutional:      General: She is not in acute distress.    Appearance: She is not ill-appearing, toxic-appearing or diaphoretic.  HENT:     Head: Normocephalic and atraumatic.     Right Ear: Tympanic membrane and external ear normal.     Left Ear: Tympanic membrane and external ear normal.     Nose: Nose normal.     Mouth/Throat:     Mouth: Mucous membranes are dry.     Pharynx: Oropharynx is clear.  Eyes:     General: No scleral icterus.       Right eye: No discharge.        Left eye: No discharge.     Extraocular Movements: Extraocular movements intact.     Conjunctiva/sclera: Conjunctivae normal.      Pupils: Pupils are equal, round, and reactive to light.  Neck:     Vascular: No JVD.  Cardiovascular:     Rate and Rhythm: Tachycardia present. Rhythm irregular.     Pulses: Normal pulses.          Radial pulses are 2+ on the right side and 2+ on the left side.     Heart sounds: Normal heart sounds.     Comments: HR in 140s during exam Pulmonary:     Comments: Lungs clear to auscultation in all fields. Symmetric chest rise. No wheezing, rales, or rhonchi. Abdominal:     Comments: Abdomen is soft, non-distended, and non-tender in all quadrants. No rigidity, no guarding. No peritoneal signs.  Musculoskeletal:        General: Normal range of motion.     Cervical back: Normal range of motion.  Skin:    General: Skin is warm and dry.     Capillary Refill: Capillary refill takes less than 2 seconds.  Neurological:     Mental Status: She is oriented to person, place, and time.     GCS: GCS eye subscore is 4. GCS verbal subscore is 5. GCS motor subscore is 6.     Comments: Fluent speech, no facial droop.  Speech is clear and goal oriented, follows commands CN III-XII intact, no facial droop Normal strength in upper and lower extremities bilaterally including dorsiflexion and plantar flexion, strong and equal grip strength Sensation normal to light and sharp touch Moves extremities without ataxia, coordination intact Normal finger to nose and rapid alternating movements Normal gait and balance   Psychiatric:        Behavior: Behavior normal.     ED Results / Procedures / Treatments  Labs (all labs ordered are listed, but only abnormal results are displayed) Labs Reviewed  BASIC METABOLIC PANEL - Abnormal; Notable for the following components:      Result Value   CO2 19 (*)    Glucose, Bld 346 (*)    All other components within normal limits  CBC - Abnormal; Notable for the following components:   WBC 11.1 (*)    RBC 5.23 (*)    Hemoglobin 16.6 (*)    HCT 46.5 (*)    All  other components within normal limits  URINALYSIS, ROUTINE W REFLEX MICROSCOPIC - Abnormal; Notable for the following components:   Color, Urine STRAW (*)    Glucose, UA >=500 (*)    All other components within normal limits  CBG MONITORING, ED - Abnormal; Notable for the following components:   Glucose-Capillary 216 (*)    All other components within normal limits  RESP PANEL BY RT-PCR (FLU A&B, COVID) ARPGX2  URINE CULTURE  MAGNESIUM  I-STAT BETA HCG BLOOD, ED ([facility], [facility], [facility] ONLY)  TROPONIN I (HIGH SENSITIVITY)  TROPONIN I (HIGH SENSITIVITY)    EKG EKG Interpretation  Date/Time:  Monday July 28 2020 05:52:28 EST Ventricular Rate:  163 PR Interval:    QRS Duration: 84 QT Interval:  288 QTC Calculation: 474 R Axis:   46 Text Interpretation: Atrial fibrillation with rapid ventricular response Anteroseptal infarct , age undetermined Marked ST abnormality, possible inferior subendocardial injury Abnormal ECG Confirmed by Zadie Rhine ([id number]) on 07/28/2020 6:32:27 AM   Radiology DG Chest Port 1 View  Result Date: 07/28/2020 CLINICAL DATA:  Chest pain and shortness of breath. EXAM: PORTABLE CHEST 1 VIEW COMPARISON:  06/13/2018 FINDINGS: 0656 hours. The lungs are clear without focal pneumonia, edema, pneumothorax or pleural effusion. Cardiopericardial silhouette is at upper limits of normal for size. The visualized bony structures of the thorax show no acute abnormality. Telemetry leads overlie the chest. IMPRESSION: No active disease. Electronically Signed   By: Kennith Center M.D.   On: 07/28/2020 07:10    Procedures .Critical Care Performed by: Shanon Ace, PA-C Authorized by: Shanon Ace, PA-C   Critical care provider statement:    Critical care time (minutes):  30   Critical care time was exclusive of:  Separately billable procedures and treating other patients and teaching time   Critical care was necessary to treat or prevent imminent or  life-threatening deterioration of the following conditions: afib with rvr. rate not contolled with IV meds.   Critical care was time spent personally by me on the following activities:  Development of treatment plan with patient or surrogate, evaluation of patient's response to treatment, examination of patient, interpretation of cardiac output measurements, obtaining history from patient or surrogate, ordering and performing treatments and interventions, ordering and review of laboratory studies, ordering and review of radiographic studies, pulse oximetry, re-evaluation of patient's condition and review of old charts   I assumed direction of critical care for this patient from another provider in my specialty: no     Care discussed with: admitting provider     (including critical care time)  Medications Ordered in ED Medications  diltiazem (CARDIZEM) 1 mg/mL load via infusion 20 mg (20 mg Intravenous Bolus from Bag 07/28/20 0858)    And  diltiazem (CARDIZEM) 125 mg in dextrose 5% 125 mL (1 mg/mL) infusion (11.5 mg/hr Intravenous Rate/Dose Change 07/28/20 1200)  diltiazem (CARDIZEM) injection 10 mg (10 mg Intravenous Given 07/28/20 0724)  sodium chloride 0.9 % bolus  1,000 mL (0 mLs Intravenous Stopped 07/28/20 0857)  insulin aspart (novoLOG) injection 10 Units (10 Units Subcutaneous Given 07/28/20 4235)    ED Course  I have reviewed the triage vital signs and the nursing notes.  Pertinent labs & imaging results that were available during my care of the patient were reviewed by me and considered in my medical decision making (see chart for details).   Vitals:   07/28/20 1200 07/28/20 1215 07/28/20 1230 07/28/20 1245  BP:   (!) 147/98   Pulse: (!) 131 (!) 133 (!) 141 99  Resp: (!) 24 (!) 21 13 (!) 23  Temp:      TempSrc:      SpO2: 99% 99% 99% 99%  Weight:      Height:          MDM Rules/Calculators/A&P                         [id number]} History provided by patient with additional history  obtained from chart review.    CHA2DS2-VASc Score = 3  This indicates a 3.2% annual risk of stroke. The patient's score is based upon: CHF History: No HTN History: Yes Diabetes History: Yes Stroke History: No Vascular Disease History: No Age Score: 0 Gender Score: 83    45 year old female presenting with chest pain and shortness of breath.  I viewed EKG on ED arrival and shows she is in A. fib with RVR.  Heart rate in the 170s.  She is afebrile, normotensive.  Looks dehydrated, mucous membranes are dry.  Has a normal neuro exam.  No active chest pain, resolved 10 minutes prior to my exam. CHADS2Vasc of 3.  CBC looks to be hemoconcentrated, with patient's physical exam likely related to dehydration.Troponin 11, second troponin 13. BMP with bicarb 19, hyperglycemia 346 with anion gap of 15, high end of normal. Not suggestive of DKA. No significant electrolyte derangement, no renal insufficiency. Covid and influenza tests are negative. I viewed pt's chest xray and it does not suggest acute infectious processes. Patient given liter of IV fluids and will attempt rate control Cardizem bolus. Reassessed patient after cardizem bolus, heart rate still in the 140s. Will give 10 units subcutaneous insulin and Cardizem loading dose and start infusion.  Glucose 216 after interventions. UA without infection.   Patient heart rate still ranging 140-160. This case was discussed with Dr. Myrtis Ser who agrees with plan to admit. Patient will need admission for rate control. Spoke with Dr. Chipper Herb with hospitalist service who agrees to assume care of patient and bring into the hospital for further evaluation and management. Appreciate admission.   Portions of this note were generated with Scientist, clinical (histocompatibility and immunogenetics). Dictation errors may occur despite best attempts at proofreading.   Final Clinical Impression(s) / ED Diagnoses Final diagnoses:  Atrial fibrillation with RVR [facility])    Rx / DC Orders ED Discharge Orders          Ordered    Amb referral to AFIB Clinic        07/28/20 0640           Shanon Ace, PA-C 07/28/20 1318    Sabino Donovan, MD 07/29/20 [phone]

## 2020-07-28 NOTE — ED Provider Notes (Incomplete)
Assumed care of this pt at 3:00 PM. Briefly:  PMHx: atrial fibrillation on Eliquis, T2DM, HTN, obesity  Presented for several hours chest pain, dyspnea. Endorses compliance with her medications except for missing a dose of Eliquis on Friday (3 days ago), none in the last 2 days.  Found to be in atrial fibrillation with RVR. Has been evaluated by cardiology.  Plan at time of handoff:  Attempt synchronized cardioversion. If successful, pt felt safe for discharge with increased dosage of home medications.  Physical Exam  BP (!) 147/98   Pulse (!) 151   Temp 98.2 F (36.8 C) (Oral)   Resp 19   Ht 5\' 7"  (1.702 m)   Wt 117 kg   SpO2 98%   BMI 40.40 kg/m   Physical Exam Vitals and nursing note reviewed.  Constitutional:      General: She is not in acute distress.    Appearance: She is well-developed and well-nourished.  HENT:     Head: Normocephalic and atraumatic.     Mouth/Throat:     Mouth: Mucous membranes are moist.  Eyes:     General: No scleral icterus. Cardiovascular:     Rate and Rhythm: Tachycardia present. Rhythm irregular.     Pulses: Normal pulses.     Heart sounds: No friction rub. No gallop.   Pulmonary:     Effort: Pulmonary effort is normal. No respiratory distress.     Breath sounds: Normal breath sounds. No wheezing, rhonchi or rales.  Musculoskeletal:        General: No edema.     Cervical back: Neck supple.  Skin:    General: Skin is warm and dry.  Neurological:     Mental Status: She is alert.     Comments: Alert, grossly oriented, moves all extremities spontaneously  Psychiatric:        Mood and Affect: Mood and affect normal.        Behavior: Behavior normal.     ED Course/Procedures     .Cardioversion  Date/Time: 07/28/2020 3:24 PM Performed by: 09/25/2020, MD Authorized by: Corliss Blacker, MD   Consent:    Consent obtained:  Verbal and written   Consent given by:  Patient   Risks discussed:  Pain and induced  arrhythmia Universal protocol:    Patient identity confirmed:  Verbally with patient Pre-procedure details:  Patient sedated: Yes. Refer to sedation procedure documentation for details of sedation.  Attempt one:    Cardioversion mode:  Synchronous   Shock (Joules):  150   Shock outcome:  No change in rhythm Attempt two:    Cardioversion mode:  Synchronous   Shock (Joules):  200   Shock outcome:  Conversion to normal sinus rhythm Post-procedure details:    Patient status:  Awake   Patient tolerance of procedure:  Tolerated well, no immediate complications Comments:     Initial attempt with 150J unsuccessful, 2nd attempt with 200J successful.   .Sedation  Date/Time: 07/28/2020 6:13 PM Performed by: 09/25/2020, MD Authorized by: Blane Ohara, MD   Consent:    Consent obtained:  Verbal   Consent given by:  Patient Universal protocol:    Immediately prior to procedure, a time out was called: yes     Patient identity confirmed:  Verbally with patient Indications:    Procedure performed:  Cardioversion   Procedure necessitating sedation performed by:  Different physician Pre-sedation assessment:    Time since last food or drink:  Unknown   NPO status  caution: unable to specify NPO status     NPO status caution comment:  Pt made NPO at time of arrival just after 6AM today, briefly discontinued, then NPO reinstated - unknown if pt had meal delivered while order briefly discontinued   ASA classification: class 3 - patient with severe systemic disease     Mallampati score:  Unable to assess   Neck mobility: normal     Pre-sedation assessments completed and reviewed: airway patency, cardiovascular function, mental status and respiratory function   Immediate pre-procedure details:    Reviewed: vital signs   Procedure details (see MAR for exact dosages):    Sedation:  Etomidate   Intended level of sedation: moderate (conscious sedation)   Analgesia:  None   Intra-procedure  monitoring:  Blood pressure monitoring, continuous capnometry, continuous pulse oximetry and cardiac monitor   Intra-procedure events: none   Post-procedure details:    Attendance: Constant attendance by certified staff until patient recovered     Recovery: Patient returned to pre-procedure baseline     Post-sedation assessments completed and reviewed: airway patency, cardiovascular function, mental status and respiratory function     Patient is stable for discharge or admission: yes     Procedure completion:  Tolerated well, no immediate complications    MDM  ***

## 2020-07-28 NOTE — Discharge Instructions (Addendum)
Prescription for cardizem sent to your pharmacy. Your dose was increased to 240 mg daily.  Prescription for flecainide also sent to your pharmacy for the pill in pocket treatment as discussed. You can take it if needed as prescribed for breakthrough episodes of afib.   Follow up with the afib clinic as above.   Return to emergency department for any new or worsening symptoms.

## 2020-07-28 NOTE — ED Provider Notes (Signed)
Cardiology was consulted by hospitalist and seen by Dr. Iver Nestle.  Please see his consult note. Discussed new plan with ED attending Dr. Myrtis Ser.  -Consult note reports patient is a good candidate for your cardioversion and discharge home afterwards if successful. -They recommend increase Cardizem CD to 240 mg daily and give Rx for as needed flecainide 100 mg for breakthrough episode of afib. Cardiology will arrange close follow up win afib clinic later this week.   I have sent new prescriptions to her pharmacy per cardiology recommendations. At shift change cardioversion signed out to oncoming provider Dr. Jodi Mourning. Patient agreeable with plan of care.    Shanon Ace, PA-C 07/28/20 1527    Sabino Donovan, MD 07/29/20 [phone]

## 2020-07-28 NOTE — ED Notes (Signed)
Walked patient to the bathroom patient did well 

## 2020-07-28 NOTE — H&P (Addendum)
History and Physical    Julia Dickerson ZOX:[id number] DOB: [birth year] DOA: 07/28/2020  PCP: Deeann Saint, MD (Confirm with patient/family/NH records and if not entered, this has to be entered at [facility] point of entry) Patient coming from: Home  I have personally briefly reviewed patient's old medical records in [facility]  Chief Complaint: Palpitations and chest pain  HPI: Julia Dickerson is a 45 y.o. female with medical history significant of paroxysmal A. fib fairly controlled since 2018, IDDM, HLD, HTN, morbid obesity, OSA noncompliant with CPAP, presented with rapid A. fib and chest pain.  Patient report that her A. fib has been controlled with the same Cardizem regimen of 180 mg daily since 2018.  She was diagnosed with OSA and has been on CPAP for last 2+ years, however she decided to stop use it for 4 to 5 weeks.  There has been no new medications were significant changes of her diabetic or pain control medications recently. Last night, around 3 AM, she woke up with strong feeling of palpitations and pressure-like chest pain noniron of, radiated to the left arm, unlawful, associated with shortness of breath, and feeling nausea but no vomiting.  Denies any fever chills cough, denied any illicit drug use. ED Course: Heart rate ranging from 130s to 170s, Cardizem bolus given and drip started, despite, heart rate still poorly controlled.  Chest pain appears to be on and off and related to rapid heart rate.  K4.2, glucose 346, WBC 11.  Covid negative.  Chest x-ray negative for acute findings  Review of Systems: As per HPI otherwise 14 point review of systems negative.    Past Medical History:  Diagnosis Date  . Anxiety   . Arthritis   . Atrial fibrillation (HCC)    a. paroxysmal, uses PRN Flecainide  . Depression   . Diabetes mellitus without complication (HCC)   . Dysrhythmia   . GERD (gastroesophageal reflux disease)   . Hypertension   . Obesity   . PCOS (polycystic  ovarian syndrome)   . UTI (lower urinary tract infection)     Past Surgical History:  Procedure Laterality Date  . DILATION AND CURETTAGE OF UTERUS  2011 and 2012   x2  . GXT - Graded Exercise Tolerance Test  07/22/2016   Exercise time 6 minutes - 7 METs. Maximum heart rate 146 BPM. - No EKG changes. No arrhythmias or chest pain.: Low risk test.  . TRANSTHORACIC ECHOCARDIOGRAM  06/2016   EF 6570% with mild LVH. Normal wall motion and diastolic function. Mild RAE. Otherwise normal.  . WISDOM TOOTH EXTRACTION       reports that she quit smoking about 23 months ago. Her smoking use included cigarettes and e-cigarettes. She has a 10.50 pack-year smoking history. She has never used smokeless tobacco. She reports current alcohol use. She reports that she does not use drugs.  Allergies  Allergen Reactions  . Sulfa Antibiotics Hives    Family History  Problem Relation Age of Onset  . Heart failure Father   . Heart attack Father   . Cancer Father   . Diabetes Father   . Hearing loss Father   . Hyperlipidemia Father   . Hypertension Father   . Arthritis Mother   . Diabetes Mother   . COPD Maternal Grandmother   . Heart disease Maternal Grandmother   . Hyperlipidemia Maternal Grandmother   . Hyperlipidemia Maternal Grandfather   . Hypertension Maternal Grandfather   . Hearing loss Maternal Grandfather   .  Alcohol abuse Maternal Grandfather   . Arthritis Maternal Grandfather   . Stroke Maternal Grandfather   . Hyperlipidemia Paternal Grandmother   . Miscarriages / Stillbirths Paternal Grandmother   . Kidney disease Paternal Grandfather   . Hypertension Paternal Grandfather   . Heart attack Paternal Grandfather   . Diabetes Paternal Grandfather   . Arthritis Sister      Prior to Admission medications   Medication Sig Start Date End Date Taking? Authorizing Provider  apixaban (ELIQUIS) 5 MG TABS tablet TAKE 1 TABLET (5 MG TOTAL) 2 (TWO) TIMES DAILY BY MOUTH. 11/06/19   Leonie Man, MD  atorvastatin (LIPITOR) 40 MG tablet TAKE 1 TABLET BY MOUTH EVERY DAY 01/16/20   Martinique, Betty G, MD  diltiazem (CARDIZEM CD) 120 MG 24 hr capsule Take 1 capsule (120 mg total) by mouth as needed. 01/19/18   Leonie Man, MD  diltiazem (CARDIZEM CD) 180 MG 24 hr capsule TAKE 1 CAPSULE BY MOUTH EVERY DAY 12/12/19   Leonie Man, MD  Dulaglutide (TRULICITY) 1.5 UK/0.2RK SOPN Inject 1.5 mg into the skin once a week. 10/08/19   Shamleffer, Melanie Crazier, MD  DULoxetine (CYMBALTA) 30 MG capsule Take 30 mg by mouth daily.    [provider]  flecainide (TAMBOCOR) 100 MG tablet Take 100 mg by mouth 2 (two) times daily. As needed    [provider]  fluticasone (FLONASE) 50 MCG/ACT nasal spray PLACE 1 SPRAY INTO BOTH NOSTRILS DAILY. 01/29/20   Billie Ruddy, MD  Insulin Lispro Prot & Lispro (HUMALOG MIX 75/25 KWIKPEN) (75-25) 100 UNIT/ML Kwikpen Inject 32 Units into the skin daily with breakfast AND 36 Units daily with supper. 06/24/20   Shamleffer, Melanie Crazier, MD  Insulin Pen Needle 32G X 4 MM MISC Four times daily 10/08/19   Shamleffer, Melanie Crazier, MD  lisinopril (ZESTRIL) 10 MG tablet TAKE 1 TABLET BY MOUTH EVERY DAY 06/02/20   Billie Ruddy, MD  metFORMIN (GLUCOPHAGE-XR) 500 MG 24 hr tablet Take 1 tablet (500 mg total) by mouth 2 (two) times daily with a meal. 10/08/19   Shamleffer, Melanie Crazier, MD  Multiple Vitamin (MULTIVITAMIN WITH MINERALS) TABS tablet Take 1 tablet by mouth daily.    [provider]  omeprazole (PRILOSEC) 20 MG capsule  02/09/10   [provider]    Physical Exam: Vitals:   07/28/20 1200 07/28/20 1215 07/28/20 1230 07/28/20 1245  BP:   (!) 147/98   Pulse: (!) 131 (!) 133 (!) 141 99  Resp: (!) 24 (!) 21 13 (!) 23  Temp:      TempSrc:      SpO2: 99% 99% 99% 99%  Weight:      Height:        Constitutional: NAD, calm, comfortable Vitals:   07/28/20 1200 07/28/20 1215 07/28/20 1230 07/28/20 1245  BP:    (!) 147/98   Pulse: (!) 131 (!) 133 (!) 141 99  Resp: (!) 24 (!) 21 13 (!) 23  Temp:      TempSrc:      SpO2: 99% 99% 99% 99%  Weight:      Height:       Eyes: PERRL, lids and conjunctivae normal ENMT: Mucous membranes are dry. Posterior pharynx clear of any exudate or lesions.Normal dentition.  Neck: normal, supple, no masses, no thyromegaly Respiratory: clear to auscultation bilaterally, no wheezing, no crackles. Normal respiratory effort. No accessory muscle use.  Cardiovascular: Irregular heart rate, tachycardia, no murmurs / rubs /  gallops. No extremity edema. 2+ pedal pulses. No carotid bruits.  Abdomen: no tenderness, no masses palpated. No hepatosplenomegaly. Bowel sounds positive.  Musculoskeletal: no clubbing / cyanosis. No joint deformity upper and lower extremities. Good ROM, no contractures. Normal muscle tone.  Skin: no rashes, lesions, ulcers. No induration Neurologic: CN 2-12 grossly intact. Sensation intact, DTR normal. Strength 5/5 in all 4.  Psychiatric: Normal judgment and insight. Alert and oriented x 3. Normal mood.    Labs on Admission: I have personally reviewed following labs and imaging studies  CBC: Recent Labs  Lab 07/28/20 0632  WBC 11.1*  HGB 16.6*  HCT 46.5*  MCV 88.9  PLT 378   Basic Metabolic Panel: Recent Labs  Lab 07/28/20 0632 07/28/20 0640  NA 136  --   K 4.2  --   CL 102  --   CO2 19*  --   GLUCOSE 346*  --   BUN 13  --   CREATININE 0.78  --   CALCIUM 10.1  --   MG  --  1.9   GFR: Estimated Creatinine Clearance: 120 mL/min (by C-G formula based on SCr of 0.78 mg/dL). Liver Function Tests: No results for input(s): AST, ALT, ALKPHOS, BILITOT, PROT, ALBUMIN in the last 168 hours. No results for input(s): LIPASE, AMYLASE in the last 168 hours. No results for input(s): AMMONIA in the last 168 hours. Coagulation Profile: No results for input(s): INR, PROTIME in the last 168 hours. Cardiac Enzymes: No results for input(s):  CKTOTAL, CKMB, CKMBINDEX, TROPONINI in the last 168 hours. BNP (last 3 results) No results for input(s): PROBNP in the last 8760 hours. HbA1C: No results for input(s): HGBA1C in the last 72 hours. CBG: Recent Labs  Lab 07/28/20 1029  GLUCAP 216*   Lipid Profile: No results for input(s): CHOL, HDL, LDLCALC, TRIG, CHOLHDL, LDLDIRECT in the last 72 hours. Thyroid Function Tests: No results for input(s): TSH, T4TOTAL, FREET4, T3FREE, THYROIDAB in the last 72 hours. Anemia Panel: No results for input(s): VITAMINB12, FOLATE, FERRITIN, TIBC, IRON, RETICCTPCT in the last 72 hours. Urine analysis:    Component Value Date/Time   COLORURINE STRAW (A) 07/28/2020 1107   APPEARANCEUR CLEAR 07/28/2020 1107   LABSPEC 1.013 07/28/2020 1107   PHURINE 5.0 07/28/2020 1107   GLUCOSEU >=500 (A) 07/28/2020 1107   GLUCOSEU 250 (A) 12/26/2017 1136   HGBUR NEGATIVE 07/28/2020 1107   BILIRUBINUR NEGATIVE 07/28/2020 1107   KETONESUR NEGATIVE 07/28/2020 1107   PROTEINUR NEGATIVE 07/28/2020 1107   UROBILINOGEN 0.2 12/26/2017 1136   NITRITE NEGATIVE 07/28/2020 1107   LEUKOCYTESUR NEGATIVE 07/28/2020 1107    Radiological Exams on Admission: DG Chest Port 1 View  Result Date: 07/28/2020 CLINICAL DATA:  Chest pain and shortness of breath. EXAM: PORTABLE CHEST 1 VIEW COMPARISON:  06/13/2018 FINDINGS: 0656 hours. The lungs are clear without focal pneumonia, edema, pneumothorax or pleural effusion. Cardiopericardial silhouette is at upper limits of normal for size. The visualized bony structures of the thorax show no acute abnormality. Telemetry leads overlie the chest. IMPRESSION: No active disease. Electronically Signed   By: Kennith Center M.D.   On: 07/28/2020 07:10    EKG: Independently reviewed.  Rapid A. fib, 1 box ST depression V5 V6, half box ST depression 2 3 aVF.  Assessment/Plan Active Problems:   Paroxysmal atrial fibrillation (HCC): CHA2DS2Vasc ~3   A-fib (HCC)  (please populate well all  problems here in Problem List. (For example, if patient is on BP meds at home and you resume or  decide to hold them, it is a problem that needs to be her. Same for CAD, COPD, HLD and so on)  A. fib with RVR -Patient has been on Cardizem drip since this morning, appears to respond poorly -Consult cardiology, who will evaluate patient this afternoon and help to manage rapid heart rate -Continue Eliquis -Placed on observation for Cardizem drip, expect less than 24 hours hospital stay for Cardizem drip bridging back to p.o. rate control, in case heart rate not well controlled in AM, will escalate to full admission. -TSH, UDS  Angina -Likely from uncontrolled A. fib, with significant ST depression on V5-V6 -Defer to cardiology for further management, Eliquis versus switching to a ACS anticoagulation. -Echocardiogram when heart rate more controlled  Dehydration -Denies any poor oral intake or diarrhea -We will give IVF x24 hours then reevaluate.  Uncontrolled IDDM with hyperglycemia -Most recent A1c=8, has been following with endocrinologist who adjusted patient's insulin regimen 3 months ago -Add sliding scale -Continue Metformin  OSA noncompliant with CPAP -Restart CPAP -Educated patient at bedside regarding importance of continuing CPAP therapy  OA of lumbar spine/chronic back pain -Continue duloxetine  HTN -ACEI  HLD -Statin  DVT prophylaxis: Eliquis  code Status: Full Family Communication: None at bedside Disposition Plan: Expect less than 24 hours hospital stay, if heart rate not controlled, will escalate to admission. Consults called: Cardiology Admission status: PCU for Cardizem drip   Emeline General MD [facility] Pager [phone]  07/28/2020, 1:01 PM

## 2020-07-28 NOTE — ED Notes (Signed)
RN called pharmacy to verify medicine

## 2020-07-28 NOTE — ED Provider Notes (Signed)
ATTENDING SUPERVISORY NOTE I have personally viewed the imaging studies performed, and I was present for key and critical portions of the procedure as documented. I have personally seen and examined the patient, and discussed the plan of care with the resident.  I have reviewed the documentation of the resident and agree.   Atrial fibrillation with RVR (HCC)  OSA (obstructive sleep apnea) - Plan: Amb referral to AFIB Clinic  I was present for 20 minutes for sedation and cardioversion using 150 followed by 200 Joules.  Risk and benefits discussed. Patient returned to baseline afterwards, po fluids given. EKG reviewed, NSR. Patient has follow up and on anticoagulant.   .Critical Care Performed by: Blane Ohara, MD Authorized by: Blane Ohara, MD   Critical care provider statement:    Critical care time (minutes):  35   Critical care start time:  07/28/2020 4:40 PM   Critical care end time:  07/28/2020 5:15 PM   Critical care time was exclusive of:  Separately billable procedures and treating other patients and teaching time   Critical care was necessary to treat or prevent imminent or life-threatening deterioration of the following conditions:  Cardiac failure   Critical care was time spent personally by me on the following activities:  Evaluation of patient's response to treatment, examination of patient, ordering and performing treatments and interventions, ordering and review of laboratory studies, pulse oximetry and re-evaluation of patient's condition    Kenton Kingfisher, MD 07/30/20 1614

## 2020-07-28 NOTE — ED Provider Notes (Signed)
Assumed care of this pt at 3:00 PM. Briefly:  PMHx: atrial fibrillation on Eliquis, T2DM, HTN, obesity  Presented for several hours chest pain, dyspnea. Endorses compliance with her medications except for missing a dose of Eliquis on Friday (3 days ago), none in the last 2 days.  Found to be in atrial fibrillation with RVR. Has been evaluated by cardiology.  Plan at time of handoff:  Attempt synchronized cardioversion. If successful, pt felt safe for discharge with increased dosage of home medications.  Physical Exam  BP (!) 147/98   Pulse (!) 151   Temp 98.2 F (36.8 C) (Oral)   Resp 19   Ht 5\' 7"  (1.702 m)   Wt 117 kg   SpO2 98%   BMI 40.40 kg/m   Physical Exam Vitals and nursing note reviewed.  Constitutional:      General: She is not in acute distress.    Appearance: She is well-developed and well-nourished.  HENT:     Head: Normocephalic and atraumatic.     Mouth/Throat:     Mouth: Mucous membranes are moist.  Eyes:     General: No scleral icterus. Cardiovascular:     Rate and Rhythm: Tachycardia present. Rhythm irregular.     Pulses: Normal pulses.     Heart sounds: No friction rub. No gallop.   Pulmonary:     Effort: Pulmonary effort is normal. No respiratory distress.     Breath sounds: Normal breath sounds. No wheezing, rhonchi or rales.  Musculoskeletal:        General: No edema.     Cervical back: Neck supple.  Skin:    General: Skin is warm and dry.  Neurological:     Mental Status: She is alert.     Comments: Alert, grossly oriented, moves all extremities spontaneously  Psychiatric:        Mood and Affect: Mood and affect normal.        Behavior: Behavior normal.     ED Course/Procedures     .Cardioversion  Date/Time: 07/28/2020 3:24 PM Performed by: 09/25/2020, MD Authorized by: Corliss Blacker, MD   Consent:    Consent obtained:  Verbal and written   Consent given by:  Patient   Risks discussed:  Pain and induced  arrhythmia Universal protocol:    Patient identity confirmed:  Verbally with patient Pre-procedure details:    Cardioversion basis:  Elective   Rhythm:  Atrial fibrillation   Electrode placement:  Anterior-posterior Patient sedated: Yes. Refer to sedation procedure documentation for details of sedation.  Attempt one:    Cardioversion mode:  Synchronous   Shock (Joules):  150   Shock outcome:  No change in rhythm Attempt two:    Cardioversion mode:  Synchronous   Shock (Joules):  200   Shock outcome:  Conversion to normal sinus rhythm Post-procedure details:    Patient status:  Awake   Patient tolerance of procedure:  Tolerated well, no immediate complications Comments:     Initial attempt with 150J unsuccessful, 2nd attempt with 200J successful.   .Sedation  Date/Time: 07/28/2020 5:21 PM Performed by: 09/25/2020, MD Authorized by: Blane Ohara, MD   Consent:    Consent obtained:  Verbal   Consent given by:  Patient Universal protocol:    Immediately prior to procedure, a time out was called: yes     Patient identity confirmed:  Verbally with patient Indications:    Procedure performed:  Cardioversion   Procedure necessitating sedation performed by:  Different  physician Pre-sedation assessment:    Time since last food or drink:  Unknown   NPO status caution: unable to specify NPO status     NPO status caution comment:  Pt made NPO at time of arrival just after 6AM today, briefly discontinued, then NPO reinstated - unknown if pt had meal delivered while order briefly discontinued   ASA classification: class 3 - patient with severe systemic disease     Mallampati score:  Unable to assess   Neck mobility: normal     Pre-sedation assessments completed and reviewed: airway patency, cardiovascular function, mental status and respiratory function   Immediate pre-procedure details:    Reviewed: vital signs   Procedure details (see MAR for exact dosages):    Sedation:   Etomidate   Intended level of sedation: moderate (conscious sedation)   Analgesia:  None   Intra-procedure monitoring:  Blood pressure monitoring, continuous capnometry, continuous pulse oximetry and cardiac monitor   Intra-procedure events: none     Total Provider sedation time (minutes):  14 Post-procedure details:    Attendance: Constant attendance by certified staff until patient recovered     Recovery: Patient returned to pre-procedure baseline     Post-sedation assessments completed and reviewed: airway patency, cardiovascular function, mental status and respiratory function     Patient is stable for discharge or admission: yes     Procedure completion:  Tolerated well, no immediate complications    MDM  Pt underwent cardioversion with successful conversion to sinus rhythm in the 80s-90s after increasing to 200J. Pt tolerated the procedure well without any obvious complications and recovered well over the period following the procedure.  At this time, pt appears safe for discharge with strict return precautions provided and instructions for follow up given. Pt voiced understanding and is amenable to this plan, feels comfortable going home. Reviewed increased medication dosage and role of new medication. Reviewed plans for follow up in atrial fibrillation clinic. Pt discharged in stable condition.        Corliss Blacker, MD 07/29/20 Burna Mortimer    Blane Ohara, MD 07/30/20 [phone]

## 2020-07-29 LAB — URINE CULTURE: Culture: <10000 — AB

## 2020-07-31 ENCOUNTER — Ambulatory Visit (HOSPITAL_COMMUNITY)
Admit: 2020-07-31 | Discharge: 2020-07-31 | Disposition: A | Discharge status: Home / Self Care | Payer: 59 | Attending: Physician Assistant | Admitting: Physician Assistant

## 2020-07-31 ENCOUNTER — Other Ambulatory Visit: Payer: Self-pay

## 2020-07-31 VITALS — BP 166/102 | HR 94 | Ht 67.0 in | Wt 258.2 lb

## 2020-07-31 DIAGNOSIS — I10 Essential (primary) hypertension: Secondary | ICD-10-CM | POA: Insufficient documentation

## 2020-07-31 DIAGNOSIS — I480 Paroxysmal atrial fibrillation: Secondary | ICD-10-CM | POA: Insufficient documentation

## 2020-07-31 DIAGNOSIS — D6869 Other thrombophilia: Secondary | ICD-10-CM | POA: Insufficient documentation

## 2020-07-31 DIAGNOSIS — E669 Obesity, unspecified: Secondary | ICD-10-CM | POA: Insufficient documentation

## 2020-07-31 DIAGNOSIS — G4733 Obstructive sleep apnea (adult) (pediatric): Secondary | ICD-10-CM | POA: Diagnosis not present

## 2020-07-31 DIAGNOSIS — Z7901 Long term (current) use of anticoagulants: Secondary | ICD-10-CM | POA: Diagnosis not present

## 2020-07-31 DIAGNOSIS — Z6841 Body Mass Index (BMI) 40.0 and over, adult: Secondary | ICD-10-CM | POA: Diagnosis not present

## 2020-07-31 DIAGNOSIS — Z87891 Personal history of nicotine dependence: Secondary | ICD-10-CM | POA: Diagnosis not present

## 2020-07-31 NOTE — Progress Notes (Signed)
Primary Care Physician: Billie Ruddy, MD Primary Cardiologist: Dr Ellyn Hack Primary Electrophysiologist: none Referring Physician: [facility]   Julia Dickerson is a 45 y.o. female with a history of atrial fibrillation, DM, HTN, OSA, tobacco abuse who presents for consultation in the [facility]. Initially diagnosed in 2017. Patient has been maintained on flecainide PRN and diltiazem. Patient is on Eliquis for a CHADS2VASC score of 3. She was in her usual state of health until she woke at 2 AM on 07/28/20 with sudden onset palpitations. She did not take her PRN flecainide as the prescription had expired. ECG at ED showed afib with RVR. She underwent DCCV at that time. She admits she had not been using CPAP recently due to sinus issues.   Today, she denies symptoms of palpitations, chest pain, shortness of breath, orthopnea, PND, lower extremity edema, dizziness, presyncope, syncope, bleeding, or neurologic sequela. The patient is tolerating medications without difficulties and is otherwise without complaint today.    Atrial Fibrillation Risk Factors:  she does have symptoms or diagnosis of sleep apnea. she is compliant with CPAP therapy. she does not have a history of rheumatic fever. she does not have a history of alcohol use.   she has a BMI of Body mass index is 40.44 kg/m.[name] Filed Weights   07/31/20 0828  Weight: 117.1 kg    Family History  Problem Relation Age of Onset  . Heart failure Father   . Heart attack Father   . Cancer Father   . Diabetes Father   . Hearing loss Father   . Hyperlipidemia Father   . Hypertension Father   . Arthritis Mother   . Diabetes Mother   . COPD Maternal Grandmother   . Heart disease Maternal Grandmother   . Hyperlipidemia Maternal Grandmother   . Hyperlipidemia Maternal Grandfather   . Hypertension Maternal Grandfather   . Hearing loss Maternal Grandfather   . Alcohol abuse Maternal Grandfather   .  Arthritis Maternal Grandfather   . Stroke Maternal Grandfather   . Hyperlipidemia Paternal Grandmother   . Miscarriages / Stillbirths Paternal Grandmother   . Kidney disease Paternal Grandfather   . Hypertension Paternal Grandfather   . Heart attack Paternal Grandfather   . Diabetes Paternal Grandfather   . Arthritis Sister      Atrial Fibrillation Management history:  Previous antiarrhythmic drugs: flecainide Previous cardioversions: 07/28/20 Previous ablations: none CHADS2VASC score: 3 Anticoagulation history: Eliquis   Past Medical History:  Diagnosis Date  . Anxiety   . Arthritis   . Atrial fibrillation (HCC)    a. paroxysmal, uses PRN Flecainide  . Depression   . Diabetes mellitus without complication ([place])   . Dysrhythmia   . GERD (gastroesophageal reflux disease)   . Hypertension   . Obesity   . PCOS (polycystic ovarian syndrome)   . UTI (lower urinary tract infection)    Past Surgical History:  Procedure Laterality Date  . DILATION AND CURETTAGE OF UTERUS  2011 and 2012   x2  . GXT - Graded Exercise Tolerance Test  07/22/2016   Exercise time 6 minutes - 7 METs. Maximum heart rate 146 BPM. - No EKG changes. No arrhythmias or chest pain.: Low risk test.  . TRANSTHORACIC ECHOCARDIOGRAM  06/2016   EF 6570% with mild LVH. Normal wall motion and diastolic function. Mild RAE. Otherwise normal.  . WISDOM TOOTH EXTRACTION      Current Outpatient Medications  Medication Sig Dispense Refill  .  apixaban (ELIQUIS) 5 MG TABS tablet TAKE 1 TABLET (5 MG TOTAL) 2 (TWO) TIMES DAILY BY MOUTH. 180 tablet 3  . atorvastatin (LIPITOR) 40 MG tablet TAKE 1 TABLET BY MOUTH EVERY DAY 90 tablet 2  . cholecalciferol (VITAMIN D3) 25 MCG (1000 UNIT) tablet Take 1,000 Units by mouth daily.    [name] diltiazem (CARDIZEM CD) 120 MG 24 hr capsule Take 2 capsules (240 mg total) by mouth daily. 60 capsule 0  . Dulaglutide (TRULICITY) 1.5 MG/0.5ML SOPN Inject 1.5 mg into the skin once a week. 4 pen  11  . DULoxetine (CYMBALTA) 30 MG capsule Take 30 mg by mouth daily.    . flecainide (TAMBOCOR) 100 MG tablet Take 1 tablet (100 mg total) by mouth 2 (two) times daily as needed. For breakthrough afib 60 tablet 0  . fluticasone (FLONASE) 50 MCG/ACT nasal spray PLACE 1 SPRAY INTO BOTH NOSTRILS DAILY. 48 mL 1  . Insulin Lispro Prot & Lispro (HUMALOG MIX 75/25 KWIKPEN) (75-25) 100 UNIT/ML Kwikpen Inject 32 Units into the skin daily with breakfast AND 36 Units daily with supper. 45 mL 6  . Insulin Pen Needle 32G X 4 MM MISC Four times daily 150 each 3  . lisinopril (ZESTRIL) 10 MG tablet TAKE 1 TABLET BY MOUTH EVERY DAY 90 tablet 0  . metFORMIN (GLUCOPHAGE-XR) 500 MG 24 hr tablet Take 1 tablet (500 mg total) by mouth 2 (two) times daily with a meal. 180 tablet 3  . Multiple Vitamin (MULTIVITAMIN WITH MINERALS) TABS tablet Take 1 tablet by mouth daily.    . Multiple Vitamins-Minerals (ZINC PO) Take 1 tablet by mouth in the morning and at bedtime.    [name] omeprazole (PRILOSEC) 20 MG capsule Take 20 mg by mouth every evening.     No current facility-administered medications for this encounter.    Allergies  Allergen Reactions  . Sulfa Antibiotics Hives    Social History   Socioeconomic History  . Marital status: Married    Spouse name: Not on file  . Number of children: 0  . Years of education: Not on file  . Highest education level: Not on file  Occupational History  . Not on file  Tobacco Use  . Smoking status: Former Smoker    Packs/day: 0.50    Years: 21.00    Pack years: 10.50    Types: Cigarettes, E-cigarettes    Quit date: 08/24/2018    Years since quitting: 1.9  . Smokeless tobacco: Never Used  . Tobacco comment: Has now started cutting back vaping as well.  Vaping Use  . Vaping Use: Never used  Substance and Sexual Activity  . Alcohol use: Yes    Comment: rare  . Drug use: No  . Sexual activity: Yes    Partners: Male    Birth control/protection: None    Comment: 1st  intercourse- 18, partners- 8, married- 18 yrs   Other Topics Concern  . Not on file  Social History Narrative  . Not on file   Social Determinants of Health   Financial Resource Strain: Not on file  Food Insecurity: Not on file  Transportation Needs: Not on file  Physical Activity: Not on file  Stress: Not on file  Social Connections: Not on file  Intimate Partner Violence: Not on file     ROS- All systems are reviewed and negative except as per the HPI above.  Physical Exam: Vitals:   07/31/20 0828  BP: (!) 166/102  Pulse: 94  Weight: 117.1  kg  Height: 5\' 7"  (1.702 m)    GEN- The patient is well appearing obese female, alert and oriented x 3 today.   Head- normocephalic, atraumatic Eyes-  Sclera clear, conjunctiva pink Ears- hearing intact Oropharynx- clear Neck- supple  Lungs- Clear to ausculation bilaterally, normal work of breathing Heart- Regular rate and rhythm, no murmurs, rubs or gallops  GI- soft, NT, ND, + BS Extremities- no clubbing, cyanosis, or edema MS- no significant deformity or atrophy Skin- no rash or lesion Psych- euthymic mood, full affect Neuro- strength and sensation are intact  Wt Readings from Last 3 Encounters:  07/31/20 117.1 kg  07/28/20 117 kg  02/11/20 113.4 kg    EKG today demonstrates  SR Vent. rate 94 BPM PR interval 138 ms QRS duration 90 ms QT/QTc 364/455 ms  Echo 07/15/16 demonstrated  Left ventricle: The cavity size was normal. Wall thickness was  increased in a pattern of mild LVH. Systolic function was  vigorous. The estimated ejection fraction was in the range of 65%  to 70%. Wall motion was normal; there were no regional wall  motion abnormalities. Left ventricular diastolic function  parameters were normal.  - Mitral valve: Mildly thickened leaflets . There was trivial  regurgitation.  - Left atrium: The atrium was normal in size.  - Right atrium: The atrium was mildly dilated.  - Inferior vena  cava: The vessel was normal in size. The  respirophasic diameter changes were in the normal range (>= 50%),  consistent with normal central venous pressure.   Impressions:   - LVEF 65-70%, mild LVH, normal wall motion, normal diastolic  function, mild RAE, normal LA size, normal IVC.   Epic records are reviewed at length today  CHA2DS2-VASc Score = 3  The patient's score is based upon: CHF History: No HTN History: Yes Diabetes History: Yes Stroke History: No Vascular Disease History: No Age Score: 0 Gender Score: 1      ASSESSMENT AND PLAN: 1. Paroxysmal Atrial Fibrillation (ICD10:  I48.0) The patient's CHA2DS2-VASc score is 3, indicating a 3.2% annual risk of stroke.   S/p DCCV 07/28/20 We discussed therapeutic options today. Since this is her first episode in years, will continue present therapy.  Continue diltiazem 240 mg daily Continue flecainide 100 mg BID PRN for breakthrough afib.  2. Secondary Hypercoagulable State (ICD10:  D68.69) The patient is at significant risk for stroke/thromboembolism based upon her CHA2DS2-VASc Score of 3.  Continue Apixaban (Eliquis).   3. Obesity Body mass index is 40.44 kg/m. Lifestyle modification was discussed at length including regular exercise and weight reduction.  4. Obstructive sleep apnea The importance of adequate treatment of sleep apnea was discussed today in order to improve our ability to maintain sinus rhythm long term. Patient is back on CPAP.  5. HTN Elevated today, well controlled at previous cardiology visits. Diltiazem just recently increased. No changes today.   Follow up with Dr 09/25/20 per recall. AF clinic as needed.    Herbie Baltimore PA-C [facility] [address] [place], [place] [phone] 07/31/2020 9:03 AM

## 2020-08-15 ENCOUNTER — Other Ambulatory Visit: Payer: Self-pay

## 2020-08-15 ENCOUNTER — Ambulatory Visit: Payer: 59 | Admitting: Podiatry

## 2020-08-18 ENCOUNTER — Ambulatory Visit: Payer: 59 | Admitting: Internal Medicine

## 2020-08-18 ENCOUNTER — Encounter: Payer: Self-pay | Admitting: Internal Medicine

## 2020-08-18 ENCOUNTER — Other Ambulatory Visit: Payer: Self-pay

## 2020-08-18 VITALS — BP 140/80 | HR 93 | Ht 68.0 in | Wt 261.2 lb

## 2020-08-18 DIAGNOSIS — E785 Hyperlipidemia, unspecified: Secondary | ICD-10-CM | POA: Diagnosis not present

## 2020-08-18 DIAGNOSIS — E1140 Type 2 diabetes mellitus with diabetic neuropathy, unspecified: Secondary | ICD-10-CM

## 2020-08-18 DIAGNOSIS — Z794 Long term (current) use of insulin: Secondary | ICD-10-CM | POA: Diagnosis not present

## 2020-08-18 DIAGNOSIS — E1165 Type 2 diabetes mellitus with hyperglycemia: Secondary | ICD-10-CM

## 2020-08-18 LAB — BASIC METABOLIC PANEL
BUN: 11 mg/dL (ref 6–23)
CO2: 27 mEq/L (ref 19–32)
Calcium: 9.8 mg/dL (ref 8.4–10.5)
Chloride: 101 mEq/L (ref 96–112)
Creatinine, Ser: 0.71 mg/dL (ref 0.40–1.20)
GFR: 103.69 mL/min (ref >60.00)
Glucose, Bld: 199 mg/dL — ABNORMAL HIGH (ref 70–99)
Potassium: 4.5 mEq/L (ref 3.5–5.1)
Sodium: 138 mEq/L (ref 135–145)

## 2020-08-18 LAB — LIPID PANEL
Cholesterol: 160 mg/dL (ref 0–200)
HDL: 40.8 mg/dL (ref >39.00)
LDL Cholesterol: 94 mg/dL (ref 0–99)
NonHDL: 118.83
Total CHOL/HDL Ratio: 4
Triglycerides: 123 mg/dL (ref 0.0–149.0)
VLDL: 24.6 mg/dL (ref 0.0–40.0)

## 2020-08-18 LAB — MICROALBUMIN / CREATININE URINE RATIO
Creatinine,U: 63.7 mg/dL
Microalb Creat Ratio: 7.8 mg/g (ref 0.0–30.0)
Microalb, Ur: 5 mg/dL — ABNORMAL HIGH (ref 0.0–1.9)

## 2020-08-18 LAB — POCT GLUCOSE (DEVICE FOR HOME USE): Glucose Fasting, POC: 204 mg/dL — AB (ref 70–99)

## 2020-08-18 MED ORDER — EMPAGLIFLOZIN 10 MG PO TABS: 10.0000 mg | ORAL_TABLET | Freq: Every day | ORAL | 6 refills | Status: DC

## 2020-08-18 MED ORDER — DEXCOM G6 TRANSMITTER MISC: 1.0000 | 3 refills | Status: DC

## 2020-08-18 MED ORDER — DEXCOM G6 SENSOR MISC: 1.0000 | 3 refills | Status: DC

## 2020-08-18 MED ORDER — INSULIN LISPRO PROT & LISPRO (75-25 MIX) 100 UNIT/ML KWIKPEN: PEN_INJECTOR | SUBCUTANEOUS | 6 refills | Status: DC

## 2020-08-18 NOTE — Progress Notes (Signed)
Name: Julia Dickerson  Age/ Sex: 45 y.o., female   MRN/ DOB: [id number], 1976/05/04     PCP: Deeann Saint, MD   Reason for Endocrinology Evaluation: Type 2 diabetes Mellitus  Initial Endocrine Consultative Visit:  09/05/2018    PATIENT IDENTIFIER: Julia Dickerson is a 45 y.o. female with a past medical history of PCOS, T2DM, PAF and HTN.  The patient has followed with Endocrinology clinic since 09/05/2018 for consultative assistance with management of her diabetes.  DIABETIC HISTORY:  Julia Dickerson was diagnosed with T2DM since the 2000's.  She was initially on metformin, she was switched to MDI insulin regimen which helped control her hyperglycemia at the time, the patient was subsequently switched to oral glycemic agents in 2012 due to improved glucose control.  She was restarted on basal insulin in 2019 due to persistent hyperglycemia, she has tried Victoza in the past with severe nausea. Her hemoglobin A1c has ranged from 8.7% in in 2019, peaking at 10.7% in 08/14/2018.   Prandial insulin started in February 2020, in addition to continuing her metformin and basal insulin. Rybelsus is cost prohibitive   By 05/2019 we switched her MDI regimen to insulin mix, we started Trulicity, and continued metformin.  SUBJECTIVE:   During the last visit (02/11/2020): A1c 8.1 %.  Reduced metformin dose due to GI side effects. Increased trulicity and continued humalog Mix.       Today (08/18/2020): Julia Dickerson is here for a follow up on diabetes management. She checks glucose 2x a day, she denies any hypoglycemic episodes since her last clinic visit.  Has been having nausea and diarrhea following a trulicity injection       HOME DIABETES REGIMEN:  NovoLog mix 32 units ( has been taking 36) with Breakfast and 36 units with Supper  Trulicity 1.5 mg weekly  Metformin 500 mg XR 1 tab daily      DIABETIC COMPLICATIONS: Microvascular complications:   Neuropathy   Denies: CKD,  retinopathy   Last eye exam: Completed 09/15/2018  Macrovascular complications:    Denies: CAD, PVD, CVA   GLUCOSE LOG: AM > 200 mg/dL PM 662- 947 mg/dl    HISTORY:  Past Medical History:  Past Medical History:  Diagnosis Date  . Anxiety   . Arthritis   . Atrial fibrillation (HCC)    a. paroxysmal, uses PRN Flecainide  . Depression   . Diabetes mellitus without complication (HCC)   . Dysrhythmia   . GERD (gastroesophageal reflux disease)   . Hypertension   . Obesity   . PCOS (polycystic ovarian syndrome)   . UTI (lower urinary tract infection)    Past Surgical History:  Past Surgical History:  Procedure Laterality Date  . DILATION AND CURETTAGE OF UTERUS  2011 and 2012   x2  . GXT - Graded Exercise Tolerance Test  07/22/2016   Exercise time 6 minutes - 7 METs. Maximum heart rate 146 BPM. - No EKG changes. No arrhythmias or chest pain.: Low risk test.  . TRANSTHORACIC ECHOCARDIOGRAM  06/2016   EF 6570% with mild LVH. Normal wall motion and diastolic function. Mild RAE. Otherwise normal.  . WISDOM TOOTH EXTRACTION      Social History:  reports that she quit smoking about 1 years ago. Her smoking use included cigarettes and e-cigarettes. She has a 10.50 pack-year smoking history. She has never used smokeless tobacco. She reports current alcohol use. She reports that she does not use drugs. Family History:  Family History  Problem Relation Age of Onset  . Heart failure Father   . Heart attack Father   . Cancer Father   . Diabetes Father   . Hearing loss Father   . Hyperlipidemia Father   . Hypertension Father   . Arthritis Mother   . Diabetes Mother   . COPD Maternal Grandmother   . Heart disease Maternal Grandmother   . Hyperlipidemia Maternal Grandmother   . Hyperlipidemia Maternal Grandfather   . Hypertension Maternal Grandfather   . Hearing loss Maternal Grandfather   . Alcohol abuse Maternal Grandfather   . Arthritis Maternal Grandfather   . Stroke  Maternal Grandfather   . Hyperlipidemia Paternal Grandmother   . Miscarriages / Stillbirths Paternal Grandmother   . Kidney disease Paternal Grandfather   . Hypertension Paternal Grandfather   . Heart attack Paternal Grandfather   . Diabetes Paternal Grandfather   . Arthritis Sister      HOME MEDICATIONS: Allergies as of 08/18/2020      Reactions   Sulfa Antibiotics Hives      Medication List       Accurate as of August 18, 2020  2:40 PM. If you have any questions, ask your nurse or doctor.        STOP taking these medications   Trulicity 1.5 MG/0.5ML Sopn Generic drug: Dulaglutide Stopped by: Scarlette Shorts, MD     TAKE these medications   apixaban 5 MG Tabs tablet Commonly known as: Eliquis TAKE 1 TABLET (5 MG TOTAL) 2 (TWO) TIMES DAILY BY MOUTH.   atorvastatin 40 MG tablet Commonly known as: LIPITOR TAKE 1 TABLET BY MOUTH EVERY DAY   cholecalciferol 25 MCG (1000 UNIT) tablet Commonly known as: VITAMIN D3 Take 1,000 Units by mouth daily.   Dexcom G6 Sensor Misc 1 Device by Does not apply route as directed. Started by: Scarlette Shorts, MD   Dexcom G6 Transmitter Misc 1 Device by Does not apply route as directed. Started by: Scarlette Shorts, MD   diltiazem 120 MG 24 hr capsule Commonly known as: Cardizem CD Take 2 capsules (240 mg total) by mouth daily.   DULoxetine 30 MG capsule Commonly known as: CYMBALTA Take 30 mg by mouth daily.   empagliflozin 10 MG Tabs tablet Commonly known as: Jardiance Take 1 tablet (10 mg total) by mouth daily before breakfast. Started by: Scarlette Shorts, MD   flecainide 100 MG tablet Commonly known as: TAMBOCOR Take 1 tablet (100 mg total) by mouth 2 (two) times daily as needed. For breakthrough afib   fluticasone 50 MCG/ACT nasal spray Commonly known as: FLONASE PLACE 1 SPRAY INTO BOTH NOSTRILS DAILY.   Insulin Lispro Prot & Lispro (75-25) 100 UNIT/ML Kwikpen Commonly known as: HumaLOG Mix  75/25 KwikPen Inject 40 Units into the skin daily with breakfast AND 44 Units daily with supper. What changed: See the new instructions. Changed by: Scarlette Shorts, MD   Insulin Pen Needle 32G X 4 MM Misc Four times daily   lisinopril 10 MG tablet Commonly known as: ZESTRIL TAKE 1 TABLET BY MOUTH EVERY DAY   metFORMIN 500 MG 24 hr tablet Commonly known as: GLUCOPHAGE-XR Take 1 tablet (500 mg total) by mouth 2 (two) times daily with a meal.   multivitamin with minerals Tabs tablet Take 1 tablet by mouth daily.   omeprazole 20 MG capsule Commonly known as: PRILOSEC Take 20 mg by mouth every evening.   ZINC PO Take 1 tablet by mouth in the morning and  at bedtime.        OBJECTIVE:   Vital Signs: BP 140/80   Pulse 93   Ht 5\' 8"  (1.727 m)   Wt 261 lb 4 oz (118.5 kg)   SpO2 98%   BMI 39.72 kg/m   Wt Readings from Last 3 Encounters:  08/18/20 261 lb 4 oz (118.5 kg)  07/31/20 258 lb 3.2 oz (117.1 kg)  07/28/20 257 lb 15 oz (117 kg)     Exam: General: Pt appears well and is in NAD  Lungs: Clear with good BS bilat   Heart: RRR   Abdomen: Normoactive bowel sounds, soft, nontender, without masses or organomegaly palpable  Extremities: No pretibial edema.   Neuro: MS is good with appropriate affect, pt is alert and Ox3        DM foot exam: 10/08/2019 The skin of the feet is intact without sores or ulcerations. The pedal pulses are 2+ on right and 2+ on left. The sensation is absent  to a screening 5.07, 10 gram monofilament bilaterally       DATA REVIEWED:  Lab Results  Component Value Date   HGBA1C 8.6 (H) 07/28/2020   HGBA1C 8.1 (A) 02/11/2020   HGBA1C 7.7 (A) 10/08/2019    Results for JAYDALYN, Dickerson (MRN Arnetha Courser) as of 08/18/2020 14:37  Ref. Range 08/18/2020 10:24  Sodium Latest Ref Range: 135 - 145 mEq/L 138  Potassium Latest Ref Range: 3.5 - 5.1 mEq/L 4.5  Chloride Latest Ref Range: 96 - 112 mEq/L 101  CO2 Latest Ref Range: 19 - 32 mEq/L  27  Glucose Latest Ref Range: 70 - 99 mg/dL 08/20/2020 (H)  BUN Latest Ref Range: 6 - 23 mg/dL 11  Creatinine Latest Ref Range: 0.40 - 1.20 mg/dL 884  Calcium Latest Ref Range: 8.4 - 10.5 mg/dL 9.8  GFR Latest Ref Range: >60.00 mL/min 103.69  Total CHOL/HDL Ratio Unknown 4  Cholesterol Latest Ref Range: 0 - 200 mg/dL 1.66  HDL Cholesterol Latest Ref Range: >39.00 mg/dL 063  LDL (calc) Latest Ref Range: 0 - 99 mg/dL 94  MICROALB/CREAT RATIO Latest Ref Range: 0.0 - 30.0 mg/g 7.8  NonHDL Unknown 118.83  Triglycerides Latest Ref Range: 0.0 - 149.0 mg/dL 01.60  VLDL Latest Ref Range: 0.0 - 40.0 mg/dL 109.3  Creatinine,U Latest Units: mg/dL 23.5  Microalb, Ur Latest Ref Range: 0.0 - 1.9 mg/dL 5.0 (H)     ASSESSMENT / PLAN / RECOMMENDATIONS:   1) Type 2 Diabetes Mellitus,Poorly controlled , With Neuropathic Complications - Most recent A1c of 8.6 %. Goal A1c < 7.0 %.    - GI symptoms improved with 50% decrease dose of metformin but now she is again having GI symptoms that she attributes to Trulicity - Will stop Trulicity and start an SGLT-2 inhibitors  , cautioned against genital infections  - Will prescribe dexcom  - Interested in an insulin pump, briefly discussed Omnipod. Will refer to our CDE   MEDICATIONS: - Continue  Metformin 500 mg, 1 tablet daily  - STOP  Trulicity  - Increase Novolog Mix 40 units with First and 44 units with Supper  - Start Jardiance 10 mg, 1 tablet daily with breakfast      EDUCATION / INSTRUCTIONS:  BG monitoring instructions: Patient is instructed to check her blood sugars 4 times a day, fasting and bedtime.  Call [facility] if: BG persistently < 70 . 57.3 I reviewed the Rule of 15 for the treatment of hypoglycemia in detail with the  patient. Literature supplied.    2) Diabetic complications:   Eye: Does not have known diabetic retinopathy.   Neuro/ Feet: Does have known diabetic peripheral neuropathy.  Renal: Patient does not have  known baseline CKD. She is on an ACEI/ARB at present.    3) Dyslipidemia :  - Lipid panel has improved, will continue current dose of atorvastatin   Medication  Atorvastatin 40 mg daily   F/U in 6 months   Signed electronically by: Lyndle HerrlichAbby Jaralla Brace Welte, MD  [facility]  [facility] [address] [place], [place] [address] Phone: [phone] FAX: [phone]   CC: Deeann SaintBanks, Shannon R, MD [address] [place] [address] Phone: [phone]  Fax: [phone]  Return to Endocrinology clinic as below: Future Appointments  Date Time Provider Department Center  08/26/2020  9:30 AM Jessica PriestSpagnola, Linda D, RN [facility]  12/15/2020  9:30 AM Frona Yost, Konrad DoloresIbtehal Jaralla, MD [facility] None

## 2020-08-18 NOTE — Patient Instructions (Signed)
-   Continue  Metformin 500 mg, 1 tablet daily  - STOP  Trulicity  - Increase Novolog Mix 40 units with First and 44 units with Supper  - Start Jardiance 10 mg, 1 tablet daily with breakfast         - HOW TO TREAT LOW BLOOD SUGARS (Blood sugar LESS THAN 70 MG/DL)  Please follow the RULE OF 15 for the treatment of hypoglycemia treatment (when your (blood sugars are less than 70 mg/dL)    STEP 1: Take 15 grams of carbohydrates when your blood sugar is low, which includes:   3-4 GLUCOSE TABS  OR  3-4 OZ OF JUICE OR REGULAR SODA OR  ONE TUBE OF GLUCOSE GEL     STEP 2: RECHECK blood sugar in 15 MINUTES STEP 3: If your blood sugar is still low at the 15 minute recheck --> then, go back to STEP 1 and treat AGAIN with another 15 grams of carbohydrates.

## 2020-08-21 ENCOUNTER — Other Ambulatory Visit: Payer: Self-pay | Admitting: Internal Medicine

## 2020-08-22 ENCOUNTER — Encounter: Payer: Self-pay | Admitting: Internal Medicine

## 2020-08-22 MED ORDER — DEXCOM G6 RECEIVER DEVI: 0 refills | Status: DC

## 2020-08-26 ENCOUNTER — Ambulatory Visit: Payer: 59 | Admitting: Nutrition

## 2020-08-27 ENCOUNTER — Encounter: Payer: Self-pay | Admitting: Podiatry

## 2020-08-27 ENCOUNTER — Other Ambulatory Visit: Payer: Self-pay

## 2020-08-27 ENCOUNTER — Ambulatory Visit (INDEPENDENT_AMBULATORY_CARE_PROVIDER_SITE_OTHER): Payer: 59

## 2020-08-27 ENCOUNTER — Ambulatory Visit: Payer: 59 | Admitting: Podiatry

## 2020-08-27 DIAGNOSIS — M19072 Primary osteoarthritis, left ankle and foot: Secondary | ICD-10-CM

## 2020-08-27 DIAGNOSIS — Z79899 Other long term (current) drug therapy: Secondary | ICD-10-CM | POA: Diagnosis not present

## 2020-08-27 DIAGNOSIS — L603 Nail dystrophy: Secondary | ICD-10-CM

## 2020-08-27 DIAGNOSIS — M778 Other enthesopathies, not elsewhere classified: Secondary | ICD-10-CM | POA: Diagnosis not present

## 2020-08-27 DIAGNOSIS — M19079 Primary osteoarthritis, unspecified ankle and foot: Secondary | ICD-10-CM

## 2020-08-28 ENCOUNTER — Other Ambulatory Visit: Payer: Self-pay | Admitting: Podiatry

## 2020-08-28 ENCOUNTER — Encounter: Payer: Self-pay | Admitting: Podiatry

## 2020-08-28 DIAGNOSIS — M19079 Primary osteoarthritis, unspecified ankle and foot: Secondary | ICD-10-CM

## 2020-08-28 LAB — HEPATIC FUNCTION PANEL
AG Ratio: 1.5 (calc) (ref 1.0–2.5)
ALT: 36 U/L — ABNORMAL HIGH (ref 6–29)
AST: 27 U/L (ref 10–30)
Albumin: 4.3 g/dL (ref 3.6–5.1)
Alkaline phosphatase (APISO): 72 U/L (ref 31–125)
Bilirubin, Direct: 0.1 mg/dL (ref 0.0–0.2)
Globulin: 2.8 g/dL (calc) (ref 1.9–3.7)
Indirect Bilirubin: 0.4 mg/dL (calc) (ref 0.2–1.2)
Total Bilirubin: 0.5 mg/dL (ref 0.2–1.2)
Total Protein: 7.1 g/dL (ref 6.1–8.1)

## 2020-08-28 MED ORDER — TERBINAFINE HCL 250 MG PO TABS: 250.0000 mg | ORAL_TABLET | Freq: Every day | ORAL | 0 refills | Status: DC

## 2020-08-28 NOTE — Progress Notes (Signed)
Subjective:  Patient ID: Julia Dickerson, female    DOB: Oct 08, 1975,  MRN: [id number]  Chief Complaint  Patient presents with  . Nail Problem    Hallux left and 2nd right toenails - thick, discolored and loose x 1 year, using neosporin  . Foot Pain    Medial midfoot left - intermittent sharp pain, throbbing steady pain lately, burning some, does have lower back issues, no injury, tried Ibuprofen or Tylenol, ongoing x 3 years  . Diabetes    Last a1c was 8.6  . New Patient (Initial Visit)    45 y.o. female presents with the above complaint. Patient presents with listed above complaint. In addition to that. Patient has not tried any treatment options however last A1c was 8.6. She is a noncontrolled diabetic. She denies any other acute complaints.   Review of Systems: Negative except as noted in the HPI. Denies N/V/F/Ch.  Past Medical History:  Diagnosis Date  . Anxiety   . Arthritis   . Atrial fibrillation (HCC)    a. paroxysmal, uses PRN Flecainide  . Depression   . Diabetes mellitus without complication (HCC)   . Dysrhythmia   . GERD (gastroesophageal reflux disease)   . Hypertension   . Obesity   . PCOS (polycystic ovarian syndrome)   . UTI (lower urinary tract infection)     Current Outpatient Medications:  .  apixaban (ELIQUIS) 5 MG TABS tablet, TAKE 1 TABLET (5 MG TOTAL) 2 (TWO) TIMES DAILY BY MOUTH., Disp: 180 tablet, Rfl: 3 .  atorvastatin (LIPITOR) 40 MG tablet, TAKE 1 TABLET BY MOUTH EVERY DAY, Disp: 90 tablet, Rfl: 2 .  cholecalciferol (VITAMIN D3) 25 MCG (1000 UNIT) tablet, Take 1,000 Units by mouth daily., Disp: , Rfl:  .  Continuous Blood Gluc Receiver (DEXCOM G6 RECEIVER) DEVI, Use as instructed to check blood sugar daily., Disp: 1 each, Rfl: 0 .  Continuous Blood Gluc Sensor (DEXCOM G6 SENSOR) MISC, 1 Device by Does not apply route as directed., Disp: 9 each, Rfl: 3 .  Continuous Blood Gluc Transmit (DEXCOM G6 TRANSMITTER) MISC, 1 Device by Does not apply route  as directed., Disp: 1 each, Rfl: 3 .  diltiazem (CARDIZEM CD) 120 MG 24 hr capsule, Take 2 capsules (240 mg total) by mouth daily., Disp: 60 capsule, Rfl: 0 .  DULoxetine (CYMBALTA) 30 MG capsule, Take 30 mg by mouth daily., Disp: , Rfl:  .  empagliflozin (JARDIANCE) 10 MG TABS tablet, Take 1 tablet (10 mg total) by mouth daily before breakfast., Disp: 30 tablet, Rfl: 6 .  flecainide (TAMBOCOR) 100 MG tablet, Take 1 tablet (100 mg total) by mouth 2 (two) times daily as needed. For breakthrough afib, Disp: 60 tablet, Rfl: 0 .  fluticasone (FLONASE) 50 MCG/ACT nasal spray, PLACE 1 SPRAY INTO BOTH NOSTRILS DAILY., Disp: 48 mL, Rfl: 1 .  HUMALOG MIX 75/25 (75-25) 100 UNIT/ML SUSP injection, INJECT 30 UNITS INTO THE SKIN 2 (TWO) TIMES DAILY WITH A MEAL., Disp: 60 mL, Rfl: 3 .  Insulin Lispro Prot & Lispro (HUMALOG MIX 75/25 KWIKPEN) (75-25) 100 UNIT/ML Kwikpen, Inject 40 Units into the skin daily with breakfast AND 44 Units daily with supper., Disp: 60 mL, Rfl: 6 .  Insulin Pen Needle 32G X 4 MM MISC, Four times daily, Disp: 150 each, Rfl: 3 .  lisinopril (ZESTRIL) 10 MG tablet, TAKE 1 TABLET BY MOUTH EVERY DAY, Disp: 90 tablet, Rfl: 0 .  metFORMIN (GLUCOPHAGE-XR) 500 MG 24 hr tablet, Take 1 tablet (500 mg  total) by mouth 2 (two) times daily with a meal., Disp: 180 tablet, Rfl: 3 .  Multiple Vitamin (MULTIVITAMIN WITH MINERALS) TABS tablet, Take 1 tablet by mouth daily., Disp: , Rfl:  .  Multiple Vitamins-Minerals (ZINC PO), Take 1 tablet by mouth in the morning and at bedtime., Disp: , Rfl:  .  omeprazole (PRILOSEC) 20 MG capsule, Take 20 mg by mouth every evening., Disp: , Rfl:   Social History   Tobacco Use  Smoking Status Former Smoker  . Packs/day: 0.50  . Years: 21.00  . Pack years: 10.50  . Types: Cigarettes, E-cigarettes  . Quit date: 08/24/2018  . Years since quitting: 2.0  Smokeless Tobacco Never Used  Tobacco Comment   Has now started cutting back vaping as well.    Allergies   Allergen Reactions  . Sulfa Antibiotics Hives   Objective:  There were no vitals filed for this visit. There is no height or weight on file to calculate BMI. Constitutional Well developed. Well nourished.  Vascular Dorsalis pedis pulses palpable bilaterally. Posterior tibial pulses palpable bilaterally. Capillary refill normal to all digits.  No cyanosis or clubbing noted. Pedal hair growth normal.  Neurologic Normal speech. Oriented to person, place, and time. Epicritic sensation to light touch grossly present bilaterally.  Dermatologic  thickened elongated dystrophic toenails noted to bilateral second digit with mycotic nature to it. Mild pain on palpation. No open wounds. No skin lesions.  Orthopedic:  Pain on palpation to the left first tarsometatarsal joint. Pain with range of motion of the first tarsometatarsal joint. No pain at the rest of the midfoot joints.   Radiographs: 3 views of skeletally mature adult left foot: Midfoot arthritis noted with arthritis of first tarsometatarsal joint. No other bony abnormalities identified. No fractures noted. Assessment:   1. Capsulitis of left foot   2. Nail dystrophy   3. Encounter for long-term current use of medication    Plan:  Patient was evaluated and treated and all questions answered.  Bilateral second digit onychomycosis -Educated the patient on the etiology of onychomycosis and various treatment options associated with improving the fungal load.  I explained to the patient that there is 3 treatment options available to treat the onychomycosis including topical, p.o., laser treatment.  Patient elected to undergo p.o. options with Lamisil/terbinafine therapy.  In order for me to start the medication therapy, I explained to the patient the importance of evaluating the liver and obtaining the liver function test.  Once the liver function test comes back normal I will start him on 6-month course of Lamisil therapy.  Patient  understood all risk and would like to proceed with Lamisil therapy.  I have asked the patient to immediately stop the Lamisil therapy if she has any reactions to it and call the office or go to the emergency room right away.  Patient states understanding   Left midfoot arthritis -I explained to the patient the etiology of arthritis and various treatment options were extensively discussed. Given the amount of pain she is having I believe she will benefit from a steroid injection help decrease acute inflammatory component associate with pain. Patient agrees with the plan like to proceed with a steroid injection. -A steroid injection was performed at left midfoot at point of maximal tenderness using 1% plain Lidocaine and 10 mg of Kenalog. This was well tolerated.   No follow-ups on file.

## 2020-09-03 ENCOUNTER — Encounter: Payer: 59 | Attending: Internal Medicine | Admitting: Nutrition

## 2020-09-03 ENCOUNTER — Other Ambulatory Visit: Payer: Self-pay

## 2020-09-03 ENCOUNTER — Other Ambulatory Visit: Payer: Self-pay | Admitting: Internal Medicine

## 2020-09-03 DIAGNOSIS — E1165 Type 2 diabetes mellitus with hyperglycemia: Secondary | ICD-10-CM | POA: Diagnosis present

## 2020-09-03 DIAGNOSIS — Z794 Long term (current) use of insulin: Secondary | ICD-10-CM | POA: Diagnosis present

## 2020-09-03 MED ORDER — OMNIPOD DASH PODS (GEN 4) MISC: 1.0000 | 6 refills | Status: DC

## 2020-09-03 MED ORDER — INSULIN LISPRO 100 UNIT/ML ~~LOC~~ SOLN: SUBCUTANEOUS | 3 refills | Status: DC

## 2020-09-03 NOTE — Progress Notes (Signed)
Patient is here to learn about insulin pump therapy.  She is currently wearing a Dexcom sensor.  We discussed how pumps work, and what is involved with using one.  She was shown the 3 models and given brochures on each model.  We discussed the advantages and disadvantages of each model.  She chose the OmniPod because "it was easiest to use, and had no tubing".  Paperwork was filled out and faxed to [employer] to determine cost and prescription paperwork put on Dr. Harvel Ricks desk for signature.  She was told to go on line to [employer] to learn about how they work and how to use this.  She was told to call me when her pump comes in to set an appointment for training.  She agreed to do this and had no final questions.

## 2020-09-03 NOTE — Patient Instructions (Signed)
Review brochure given on [employer] and go on line for further information about their pump. Call when pump comes in to set up an appointment for training.

## 2020-09-04 ENCOUNTER — Telehealth: Payer: Self-pay | Admitting: Cardiology

## 2020-09-04 MED ORDER — DILTIAZEM HCL ER COATED BEADS 120 MG PO CP24: 240.0000 mg | ORAL_CAPSULE | Freq: Every day | ORAL | 0 refills | Status: DC

## 2020-09-04 NOTE — Telephone Encounter (Signed)
*  STAT* If patient is at the pharmacy, call can be transferred to refill team.   1. Which medications need to be refilled? (please list name of each medication and dose if known)   diltiazem (CARDIZEM CD) 120 MG 24 hr capsule     2. Which pharmacy/location (including street and city if local pharmacy) is medication to be sent to? [id number] [place], [place] - [address]  3. Do they need a 30 day or 90 day supply? 90 day supply

## 2020-09-09 ENCOUNTER — Encounter: Payer: Self-pay | Admitting: Internal Medicine

## 2020-09-09 NOTE — Telephone Encounter (Signed)
Julia Quin,   Do you know what we will need to do? [facility] stated non-covered product for the [employer]mn[employer]od Dash 5 pack pods.

## 2020-09-10 ENCOUNTER — Telehealth: Payer: Self-pay | Admitting: Nutrition

## 2020-09-15 NOTE — Telephone Encounter (Signed)
Received message from [employer] rep that patient have pods. [facility] and [facility] shipped her PDM last Friday 09/05/2020

## 2020-09-17 ENCOUNTER — Telehealth: Payer: Self-pay | Admitting: Nutrition

## 2020-09-17 NOTE — Telephone Encounter (Signed)
Message left to call me to set up an appointment for the Dash pump start

## 2020-09-17 NOTE — Telephone Encounter (Signed)
I phoned the patient, but had to leave a message telling her to call me  to set up an appointment for the pump training

## 2020-09-19 ENCOUNTER — Other Ambulatory Visit: Payer: Self-pay | Admitting: Cardiology

## 2020-09-19 NOTE — Telephone Encounter (Signed)
Prescription refill request for Eliquis received. Indication:atrial fibrillation Last office visit:1/22 Clint Scr:0.71 1/22 Age: 45 Weight:118.5 kg  Prescription refilled

## 2020-09-23 NOTE — Telephone Encounter (Signed)
Faxed form for PDM prescription to [facility]

## 2020-09-26 ENCOUNTER — Encounter: Payer: Self-pay | Admitting: Family Medicine

## 2020-09-27 ENCOUNTER — Ambulatory Visit
Admission: EM | Admit: 2020-09-27 | Discharge: 2020-09-27 | Disposition: A | Discharge status: Home / Self Care | Payer: 59 | Attending: Emergency Medicine | Admitting: Emergency Medicine

## 2020-09-27 ENCOUNTER — Other Ambulatory Visit: Payer: Self-pay

## 2020-09-27 DIAGNOSIS — L02219 Cutaneous abscess of trunk, unspecified: Secondary | ICD-10-CM | POA: Diagnosis not present

## 2020-09-27 MED ORDER — DOXYCYCLINE HYCLATE 100 MG PO CAPS: 100.0000 mg | ORAL_CAPSULE | Freq: Two times a day (BID) | ORAL | 0 refills | Status: AC

## 2020-09-27 MED ORDER — CEFTRIAXONE SODIUM 1 G IJ SOLR
1.0000 g | Freq: Once | INTRAMUSCULAR | Status: AC
  Administered 2020-09-27: 1 g via INTRAMUSCULAR

## 2020-09-27 NOTE — Discharge Instructions (Addendum)
We gave you a shot of Rocephin, continue with doxycycline twice daily for 10 days Warm compresses, warm soaks in bathtub Follow-up if not seeing any improvement in the next 2 to 3 days

## 2020-09-27 NOTE — ED Provider Notes (Signed)
[name]    CSN: [id number] Arrival date & time: 09/27/20  1113      History   Chief Complaint Chief Complaint  Patient presents with  . Abscess    Located in the vaginal area.     HPI Julia Dickerson is a 45 y.o. female history of paroxysmal A. fib, hypertension, GERD, DM type II presenting today for evaluation of ingrown hair.  Reports that she has had ingrown hair to vaginal area over the past 3 days.  Reports significantly painful and tender to touch.  Area has started to drain beginning today.  HPI  Past Medical History:  Diagnosis Date  . Anxiety   . Arthritis   . Atrial fibrillation (HCC)    a. paroxysmal, uses PRN Flecainide  . Depression   . Diabetes mellitus without complication (HCC)   . Dysrhythmia   . GERD (gastroesophageal reflux disease)   . Hypertension   . Obesity   . PCOS (polycystic ovarian syndrome)   . UTI (lower urinary tract infection)     Patient Active Problem List   Diagnosis Date Noted  . Secondary hypercoagulable state (HCC) 07/31/2020  . A-fib (HCC) 07/28/2020  . Type 2 diabetes mellitus with hyperglycemia, with long-term current use of insulin (HCC) 02/11/2020  . Dyslipidemia 06/15/2019  . Hypertriglyceridemia 06/15/2019  . OSA (obstructive sleep apnea) 10/05/2018  . Weight loss 10/05/2018  . Former smoker 08/07/2018  . PCOS (polycystic ovarian syndrome) 08/03/2018  . Chronic diarrhea 09/19/2017  . Neuropathic pain 09/19/2017  . Type 2 diabetes mellitus with diabetic neuropathy, unspecified (HCC) 08/19/2017  . Hyperlipidemia associated with type 2 diabetes mellitus (HCC) 08/19/2017  . Tobacco use disorder 08/19/2017  . Current use of long term anticoagulation 05/31/2017  . Atrial fibrillation with RVR (HCC)   . Abnormal EKG 06/24/2016  . Paroxysmal atrial fibrillation (HCC): CHA2DS2Vasc ~3 06/23/2016  . Essential hypertension 06/23/2016  . Major depression, recurrent (HCC) 07/10/2014    Past Surgical History:   Procedure Laterality Date  . DILATION AND CURETTAGE OF UTERUS  2011 and 2012   x2  . GXT - Graded Exercise Tolerance Test  07/22/2016   Exercise time 6 minutes - 7 METs. Maximum heart rate 146 BPM. - No EKG changes. No arrhythmias or chest pain.: Low risk test.  . TRANSTHORACIC ECHOCARDIOGRAM  06/2016   EF 6570% with mild LVH. Normal wall motion and diastolic function. Mild RAE. Otherwise normal.  . WISDOM TOOTH EXTRACTION      OB History    Gravida  2   Para      Term      Preterm      AB  2   Living        SAB      IAB      Ectopic      Multiple      Live Births               Home Medications    Prior to Admission medications   Medication Sig Start Date End Date Taking? Authorizing Provider  doxycycline (VIBRAMYCIN) 100 MG capsule Take 1 capsule (100 mg total) by mouth 2 (two) times daily for 10 days. 09/27/20 10/07/20 Yes Zeplin Aleshire C, PA-C  atorvastatin (LIPITOR) 40 MG tablet TAKE 1 TABLET BY MOUTH EVERY DAY 01/16/20   [place], Betty G, MD  cholecalciferol (VITAMIN D3) 25 MCG (1000 UNIT) tablet Take 1,000 Units by mouth daily.    [provider]  Continuous  Blood Gluc Receiver (DEXCOM G6 RECEIVER) DEVI Use as instructed to check blood sugar daily. 08/22/20   Shamleffer, Konrad Dolores, MD  Continuous Blood Gluc Sensor (DEXCOM G6 SENSOR) MISC 1 Device by Does not apply route as directed. 08/18/20   Shamleffer, Konrad Dolores, MD  Continuous Blood Gluc Transmit (DEXCOM G6 TRANSMITTER) MISC 1 Device by Does not apply route as directed. 08/18/20   Shamleffer, Konrad Dolores, MD  diltiazem (CARDIZEM CD) 120 MG 24 hr capsule Take 2 capsules (240 mg total) by mouth daily. 09/04/20 10/04/20  Marykay Lex, MD  DULoxetine (CYMBALTA) 30 MG capsule Take 30 mg by mouth daily.    [provider]  ELIQUIS 5 MG TABS tablet TAKE 1 TABLET (5 MG TOTAL) 2 (TWO) TIMES DAILY BY MOUTH. 09/19/20   Marykay Lex, MD  empagliflozin (JARDIANCE) 10 MG TABS tablet  Take 1 tablet (10 mg total) by mouth daily before breakfast. 08/18/20   Shamleffer, Konrad Dolores, MD  flecainide (TAMBOCOR) 100 MG tablet Take 1 tablet (100 mg total) by mouth 2 (two) times daily as needed. For breakthrough afib 07/28/20 08/27/20  Namon Cirri E, PA-C  fluticasone (FLONASE) 50 MCG/ACT nasal spray PLACE 1 SPRAY INTO BOTH NOSTRILS DAILY. 01/29/20   Deeann Saint, MD  Insulin Disposable Pump (OMNIPOD DASH 5 PACK PODS) MISC 1 Device by Does not apply route as directed. 09/03/20   Shamleffer, Konrad Dolores, MD  insulin lispro (HUMALOG) 100 UNIT/ML injection Max daily 90 units per pump use 09/03/20   Shamleffer, Konrad Dolores, MD  Insulin Pen Needle 32G X 4 MM MISC Four times daily 10/08/19   Shamleffer, Konrad Dolores, MD  lisinopril (ZESTRIL) 10 MG tablet TAKE 1 TABLET BY MOUTH EVERY DAY 06/02/20   Deeann Saint, MD  metFORMIN (GLUCOPHAGE-XR) 500 MG 24 hr tablet Take 1 tablet (500 mg total) by mouth 2 (two) times daily with a meal. 10/08/19   Shamleffer, Konrad Dolores, MD  Multiple Vitamin (MULTIVITAMIN WITH MINERALS) TABS tablet Take 1 tablet by mouth daily.    [provider]  Multiple Vitamins-Minerals (ZINC PO) Take 1 tablet by mouth in the morning and at bedtime.    [provider]  omeprazole (PRILOSEC) 20 MG capsule Take 20 mg by mouth every evening. 02/09/10   [provider]  terbinafine (LAMISIL) 250 MG tablet Take 1 tablet (250 mg total) by mouth daily. 08/28/20   Candelaria Stagers, DPM    Family History Family History  Problem Relation Age of Onset  . Heart failure Father   . Heart attack Father   . Cancer Father   . Diabetes Father   . Hearing loss Father   . Hyperlipidemia Father   . Hypertension Father   . Arthritis Mother   . Diabetes Mother   . COPD Maternal Grandmother   . Heart disease Maternal Grandmother   . Hyperlipidemia Maternal Grandmother   . Hyperlipidemia Maternal Grandfather   . Hypertension Maternal Grandfather    . Hearing loss Maternal Grandfather   . Alcohol abuse Maternal Grandfather   . Arthritis Maternal Grandfather   . Stroke Maternal Grandfather   . Hyperlipidemia Paternal Grandmother   . Miscarriages / Stillbirths Paternal Grandmother   . Kidney disease Paternal Grandfather   . Hypertension Paternal Grandfather   . Heart attack Paternal Grandfather   . Diabetes Paternal Grandfather   . Arthritis Sister     Social History Social History   Tobacco Use  . Smoking status: Former Smoker  Packs/day: 0.50    Years: 21.00    Pack years: 10.50    Types: Cigarettes, E-cigarettes    Quit date: 08/24/2018    Years since quitting: 2.0  . Smokeless tobacco: Never Used  . Tobacco comment: Has now started cutting back vaping as well.  Vaping Use  . Vaping Use: Never used  Substance Use Topics  . Alcohol use: Yes    Comment: rare  . Drug use: No     Allergies   Sulfa antibiotics   Review of Systems Review of Systems  Constitutional: Negative for fatigue and fever.  Eyes: Negative for visual disturbance.  Respiratory: Negative for shortness of breath.   Cardiovascular: Negative for chest pain.  Gastrointestinal: Negative for abdominal pain, nausea and vomiting.  Musculoskeletal: Negative for arthralgias and joint swelling.  Skin: Positive for color change. Negative for rash and wound.  Neurological: Negative for dizziness, weakness, light-headedness and headaches.     Physical Exam Triage Vital Signs ED Triage Vitals  Enc Vitals Group     BP      Pulse      Resp      Temp      Temp src      SpO2      Weight      Height      Head Circumference      Peak Flow      Pain Score      Pain Loc      Pain Edu?      Excl. in GC?    No data found.  Updated Vital Signs BP (!) 151/84 (BP Location: Left Arm)   Pulse (!) 108   Temp 98.9 F (37.2 C) (Oral)   Resp 18   SpO2 97%   Visual Acuity Right Eye Distance:   Left Eye Distance:   Bilateral Distance:    Right  Eye Near:   Left Eye Near:    Bilateral Near:     Physical Exam Vitals and nursing note reviewed.  Constitutional:      Appearance: She is well-developed and well-nourished.     Comments: No acute distress  HENT:     Head: Normocephalic and atraumatic.     Nose: Nose normal.  Eyes:     Conjunctiva/sclera: Conjunctivae normal.  Cardiovascular:     Rate and Rhythm: Normal rate.  Pulmonary:     Effort: Pulmonary effort is normal. No respiratory distress.  Abdominal:     General: There is no distension.  Genitourinary:    Comments: Left pubic/mons with significant erythema and induration with multiple areas of skin peeling, far lateral area with actively draining pustular bloody discharge, no fluctuance palpated Musculoskeletal:        General: Normal range of motion.     Cervical back: Neck supple.  Skin:    General: Skin is warm and dry.  Neurological:     Mental Status: She is alert and oriented to person, place, and time.  Psychiatric:        Mood and Affect: Mood and affect normal.      UC Treatments / Results  Labs (all labs ordered are listed, but only abnormal results are displayed) Labs Reviewed - No data to display  EKG   Radiology No results found.  Procedures Procedures (including critical care time)  Medications Ordered in UC Medications  cefTRIAXone (ROCEPHIN) injection 1 g (1 g Intramuscular Given 09/27/20 1146)    Initial Impression / Assessment and Plan /  UC Course  I have reviewed the triage vital signs and the nursing notes.  Pertinent labs & imaging results that were available during my care of the patient were reviewed by me and considered in my medical decision making (see chart for details).     Abscess to mons, currently actively draining, no fluctuant areas, deferring I&D, providing Rocephin given location and starting on doxycycline.  Warm compresses/sitz bath's.  Monitor closely reviewed next 2 to 3 days and ensure starting to improve.   Patient to return if not improving or worsening.  Discussed strict return precautions. Patient verbalized understanding and is agreeable with plan.  Final Clinical Impressions(s) / UC Diagnoses   Final diagnoses:  Abscess of pubic region     Discharge Instructions     We gave you a shot of Rocephin, continue with doxycycline twice daily for 10 days Warm compresses, warm soaks in bathtub Follow-up if not seeing any improvement in the next 2 to 3 days    ED Prescriptions    Medication Sig Dispense Auth. Provider   doxycycline (VIBRAMYCIN) 100 MG capsule Take 1 capsule (100 mg total) by mouth 2 (two) times daily for 10 days. 20 capsule Jahbari Repinski, GamalielHallie C, PA-C     PDMP not reviewed this encounter.   Lew DawesWieters, Berline Semrad C, [place] JerseyPA-C 09/27/20 1154

## 2020-09-27 NOTE — ED Triage Notes (Signed)
Pt present ingrown hair located in her vaginal area. Pt states that the area is painful to touch and tender. Pt noticed this 3 days ago.

## 2020-09-30 MED ORDER — OMNIPOD DASH PODS (GEN 4) MISC: 1.0000 | 6 refills | Status: DC

## 2020-09-30 NOTE — Telephone Encounter (Signed)
Per Corrie Dandy ([employer] rep)  Sorry to start a new email but the other was getting kicked back a bunch of different ways for encryption.  The person we were speaking about with the [facility] in Feb.   From what we can tell, they have [facility] pharmacy benefit.  Can you send her pods to [facility]?  There is a possibility it may need a PA, but hopefully they can fill her pods.  I did speak with the person this morning and apologized.  It was a human error, and when they said they [facility] I thought everything was jolly.    Thank you for your help with this! I appreciate it.  Estill [phone]   I have sent the pods to [facility] as requested.

## 2020-10-04 ENCOUNTER — Other Ambulatory Visit: Payer: Self-pay | Admitting: Cardiology

## 2020-10-08 ENCOUNTER — Telehealth: Payer: Self-pay | Admitting: Nutrition

## 2020-10-08 NOTE — Telephone Encounter (Signed)
LVM to call me to schedule the pump training

## 2020-10-14 ENCOUNTER — Other Ambulatory Visit: Payer: Self-pay | Admitting: Internal Medicine

## 2020-10-14 DIAGNOSIS — E1140 Type 2 diabetes mellitus with diabetic neuropathy, unspecified: Secondary | ICD-10-CM

## 2020-11-04 ENCOUNTER — Other Ambulatory Visit: Payer: Self-pay

## 2020-11-04 MED ORDER — DILTIAZEM HCL ER COATED BEADS 120 MG PO CP24: 240.0000 mg | ORAL_CAPSULE | Freq: Every day | ORAL | 0 refills | Status: DC

## 2020-11-04 MED ORDER — ELIQUIS 5 MG PO TABS: ORAL_TABLET | ORAL | 2 refills | Status: DC

## 2020-11-04 NOTE — Telephone Encounter (Signed)
Refill request received for eliquis 5mg  and diltiazem to be sent to [facility] For eliquis: 72f, 0.71 08/18/20, 118.5kg, lovw/fenton 07/31/20 refill granted. Will route to nl refills for diltiazem

## 2020-11-06 ENCOUNTER — Other Ambulatory Visit: Payer: Self-pay | Admitting: Internal Medicine

## 2020-11-06 DIAGNOSIS — E1140 Type 2 diabetes mellitus with diabetic neuropathy, unspecified: Secondary | ICD-10-CM

## 2020-11-06 MED ORDER — METFORMIN HCL ER 500 MG PO TB24: 500.0000 mg | ORAL_TABLET | Freq: Every day | ORAL | 3 refills | Status: DC

## 2020-11-06 MED ORDER — INSULIN LISPRO 100 UNIT/ML ~~LOC~~ SOLN: SUBCUTANEOUS | 3 refills | Status: DC

## 2020-11-06 MED ORDER — DEXCOM G6 TRANSMITTER MISC: 1.0000 | 3 refills | Status: DC

## 2020-11-06 MED ORDER — EMPAGLIFLOZIN 10 MG PO TABS: 10.0000 mg | ORAL_TABLET | Freq: Every day | ORAL | 1 refills | Status: DC

## 2020-11-06 MED ORDER — DEXCOM G6 SENSOR MISC: 1.0000 | 3 refills | Status: DC

## 2020-11-12 ENCOUNTER — Other Ambulatory Visit: Payer: Self-pay

## 2020-11-12 DIAGNOSIS — I10 Essential (primary) hypertension: Secondary | ICD-10-CM

## 2020-11-12 DIAGNOSIS — E1169 Type 2 diabetes mellitus with other specified complication: Secondary | ICD-10-CM

## 2020-11-12 DIAGNOSIS — E785 Hyperlipidemia, unspecified: Secondary | ICD-10-CM

## 2020-11-12 MED ORDER — ATORVASTATIN CALCIUM 40 MG PO TABS: 1.0000 | ORAL_TABLET | Freq: Every day | ORAL | 0 refills | Status: DC

## 2020-11-12 MED ORDER — LISINOPRIL 10 MG PO TABS: 1.0000 | ORAL_TABLET | Freq: Every day | ORAL | 0 refills | Status: DC

## 2020-11-27 ENCOUNTER — Ambulatory Visit: Payer: 59 | Admitting: Cardiology

## 2020-11-27 ENCOUNTER — Other Ambulatory Visit: Payer: Self-pay

## 2020-11-27 ENCOUNTER — Other Ambulatory Visit: Payer: Self-pay | Admitting: Cardiology

## 2020-11-27 VITALS — BP 160/98 | HR 106 | Ht 68.0 in | Wt 259.6 lb

## 2020-11-27 DIAGNOSIS — E1169 Type 2 diabetes mellitus with other specified complication: Secondary | ICD-10-CM

## 2020-11-27 DIAGNOSIS — I10 Essential (primary) hypertension: Secondary | ICD-10-CM | POA: Diagnosis not present

## 2020-11-27 DIAGNOSIS — I480 Paroxysmal atrial fibrillation: Secondary | ICD-10-CM | POA: Diagnosis not present

## 2020-11-27 DIAGNOSIS — E785 Hyperlipidemia, unspecified: Secondary | ICD-10-CM

## 2020-11-27 DIAGNOSIS — D6869 Other thrombophilia: Secondary | ICD-10-CM

## 2020-11-27 MED ORDER — DILTIAZEM HCL ER COATED BEADS 240 MG PO CP24: 240.0000 mg | ORAL_CAPSULE | Freq: Every day | ORAL | 3 refills | Status: DC

## 2020-11-27 MED ORDER — DILTIAZEM HCL 60 MG PO TABS: ORAL_TABLET | ORAL | 6 refills | Status: DC

## 2020-11-27 NOTE — Progress Notes (Signed)
Primary Care Provider: Deeann Saint, MD Cardiologist: Bryan Lemma, MD Electrophysiologist: None  Clinic Note: Chief Complaint  Patient presents with  . Follow-up    4-82-month follow-up since A. fib clinic visit in January.  No breakthrough spells.  . Atrial Fibrillation    Underwent cardioversion in the ER January 3; Flecainide prescription has been refilled.  Remains on DOAC.  [name] Hypertension    Not adequately controlled.    ===================================  ASSESSMENT/PLAN   Problem List Items Addressed This Visit    Paroxysmal atrial fibrillation (HCC): CHA2DS2Vasc ~3 - Primary (Chronic)    1 breakthrough in the last couple years.  For the most part she is unable to break her spells of flecainide, but with this episode she did not have flecainide having run out of her prescription.  I agree that we do not really need to change the course of treatment.  We can increase blood pressure control by increasing diltiazem to 240 mg daily.    Continue PRN flecainide for breakthrough spells: 20 mg x 1 followed 100 mg twice daily for 3 days post breakthrough.      Relevant Medications   diltiazem (CARDIZEM CD) 240 MG 24 hr capsule   Other Relevant Orders   EKG 12-Lead (Completed)   Essential hypertension (Chronic)    BP high today.  Will increase diltiazem standing dose to 240 mg daily with resting heart rate of 106 bpm.  She is also on lisinopril 10 mg, this could be titrated up if necessary.  Due for follow-up with PCP soon, can reassess as well.      Relevant Medications   diltiazem (CARDIZEM CD) 240 MG 24 hr capsule   Hyperlipidemia associated with type 2 diabetes mellitus (HCC) (Chronic)    Last lipids had LDL of 94 with HDL of 40.8.  Triglycerides better controlled at 123. She is on atorvastatin 40 mg daily.  Tolerating relatively well.  Goal LDL is less than 100, so she is there.  As she gets older, would like to drive it lower.  She is on Jardiance along  with metformin and insulin.  Last A1c was 8.6 indicating not adequately controlled. => Is scheduled to see endocrinology later on this month.      Relevant Medications   HUMALOG MIX 75/25 KWIKPEN (75-25) 100 UNIT/ML Kwikpen   Secondary hypercoagulable state (HCC) (Chronic)    If he can consider A. fib hypercoagulable state, she is on standing dose of Eliquis with CHA2DS2-VASc 4 of 3.         ===================================  HPI:    Julia Dickerson is a 45 y.o. female with a PMH with history of PAF (diagnosed 2017), DM-2, HTN and OSA along with Tobacco Abuse who presents today for annual follow-up.  I last saw Julia Dickerson in April 2021 after about a 2.5year hiatus.  She was weaning off cigarettes as well as vaping.  Has been started on CPAP for OSA.  Sleeping better.  Less fatigue.  No breakthrough A. fib spells.  BP controlled.  Sugars are little high.  Had temporarily run out of apixaban.  This was refilled. -> Has had rare breakthrough episodes of A. fib, on maintenance dose diltiazem with PRN flecainide for breakthrough spells.  On Eliquis for anticoagulation based on CHA2DS2-VASc score of 3.  Julia Dickerson was seen in the A. fib clinic on January 6 - in f/u for 1/3 Afib - DCCV.  She went to the ER on January 3 for sudden onset  palpitations.  Did not take PRN flecainide because a prescription had run out.  In the ER, she was in A. fib RVR.  Cardioverted in the ER -> scheduled to be seen in A. fib clinic for follow-up.  Had not been using CPAP because of sinus issues since COVID-19 infection. Other than the tachycardia, no chest pain pressure or dyspnea.  Feeling better after cardioversion.  Recommended continued treatment course since this was her first breakthrough spell in years, and she had not taken her flecainide.  Diltiazem dose not increased as it had just been increased.  Discussed the importance of lifestyle modification, weight loss and use of CPAP.  Recent  Hospitalizations:   09/27/2020: Urgent care visit for ingrown pubic hair with abscess. ->  Given shot of Rocephin followed by doxycycline twice daily for 10 days.  Warm compress and warm soaks.  Reviewed  CV studies:    The following studies were reviewed today: (if available, images/films reviewed: From Epic Chart or Care Everywhere) . 07/28/2020: Synchronized DCCV for breakthrough A. fib  Interval History:   Julia Dickerson is here now for basically 4/38-month follow-up after her A. fib clinic visit.  She has not had any breakthrough spells since the episode in January. Basically she had COVID in December and had a lot of congestion and coughing issues.  As result, she was doing well with her CPAP.  She also had let her flecainide prescription lapse.  She woke up in the morning of 3 January and had classic symptoms of tachycardia and shortness of breath-found to be in A. fib RVR in ER.  Since she had not missed any Eliquis doses, she underwent cardioversion without any difficulty.  Since that episode, she really has not had any further spells.  She has had some sinus tachycardia of late with a little bit of dizziness but no lightheadedness or syncope/near syncope.  No TIA or amaurosis fugax. Also, her blood pressures have been quite high of late.  CV Review of Symptoms (Summary): no chest pain or dyspnea on exertion positive for - Occasional sinus tachycardia but no further breakthrough A. fib.  Some mild lightheadedness but no syncope or near syncope. negative for - edema, irregular heartbeat, orthopnea, paroxysmal nocturnal dyspnea, shortness of breath or Syncope/near syncope or TIA/amaurosis fugax, claudication.  Melena, hematochezia, hematuria, epistaxis.  REVIEWED OF SYSTEMS   Review of Systems  Constitutional: Negative for malaise/fatigue and weight loss.  HENT: Negative for ear discharge and nosebleeds.   Respiratory: Negative for cough and shortness of breath.        Has fully  recovered from COVID-19 infection in December 2021.  Had previously URI symptoms.  No high-grade fevers.  Mild muscle aches but no change in taste or smell.  Gastrointestinal: Negative for abdominal pain, blood in stool and melena.  Genitourinary: Negative for hematuria.  Musculoskeletal: Negative for joint pain and myalgias (None since COVID).  Neurological: Positive for dizziness (Sometimes positional). Negative for focal weakness.  Psychiatric/Behavioral: Negative for depression and memory loss. The patient is not nervous/anxious and does not have insomnia.    Urinary frequency with high blood sugars  Occasional dizziness with hyperglycemia or hypoglycemia. Intermittent insomnia.  I have reviewed and (if needed) personally updated the patient's problem list, medications, allergies, past medical and surgical history, social and family history.   PAST MEDICAL HISTORY   Past Medical History:  Diagnosis Date  . Anxiety   . Arthritis   . Atrial fibrillation (HCC)    a.  paroxysmal, uses PRN Flecainide  . Depression   . Diabetes mellitus without complication (HCC)   . Dysrhythmia   . GERD (gastroesophageal reflux disease)   . Hypertension   . Obesity   . PCOS (polycystic ovarian syndrome)   . UTI (lower urinary tract infection)     PAST SURGICAL HISTORY   Past Surgical History:  Procedure Laterality Date  . DILATION AND CURETTAGE OF UTERUS  2011 and 2012   x2  . GXT - Graded Exercise Tolerance Test  07/22/2016   Exercise time 6 minutes - 7 METs. Maximum heart rate 146 BPM. - No EKG changes. No arrhythmias or chest pain.: Low risk test.  . TRANSTHORACIC ECHOCARDIOGRAM  06/2016   EF 6570% with mild LVH. Normal wall motion and diastolic function. Mild RAE. Otherwise normal.  . WISDOM TOOTH EXTRACTION      Immunization History  Administered Date(s) Administered  . Influenza Inj Mdck Quad Pf 04/21/2019, 04/21/2019  . Influenza Split 03/28/2018    MEDICATIONS/ALLERGIES    Current Meds  Medication Sig  . apixaban (ELIQUIS) 5 MG TABS tablet TAKE 1 TABLET (5 MG TOTAL) 2 (TWO) TIMES DAILY BY MOUTH.  [name] atorvastatin (LIPITOR) 40 MG tablet Take 1 tablet (40 mg total) by mouth daily.  . cholecalciferol (VITAMIN D3) 25 MCG (1000 UNIT) tablet Take 1,000 Units by mouth daily.  . Continuous Blood Gluc Receiver (DEXCOM G6 RECEIVER) DEVI Use as instructed to check blood sugar daily.  . Continuous Blood Gluc Sensor (DEXCOM G6 SENSOR) MISC 1 Device by Does not apply route as directed.  . Continuous Blood Gluc Transmit (DEXCOM G6 TRANSMITTER) MISC 1 Device by Does not apply route as directed.  . diltiazem (CARDIZEM CD) 240 MG 24 hr capsule Take 1 capsule (240 mg total) by mouth daily.  . DULoxetine (CYMBALTA) 30 MG capsule Take 30 mg by mouth daily.  . empagliflozin (JARDIANCE) 10 MG TABS tablet Take 1 tablet (10 mg total) by mouth daily before breakfast.  . flecainide (TAMBOCOR) 100 MG tablet Take 1 tablet (100 mg total) by mouth 2 (two) times daily as needed. For breakthrough afib  . fluticasone (FLONASE) 50 MCG/ACT nasal spray PLACE 1 SPRAY INTO BOTH NOSTRILS DAILY.  [name] HUMALOG MIX 75/25 KWIKPEN (75-25) 100 UNIT/ML Kwikpen Inject into the skin 3 (three) times daily before meals.  . insulin lispro (HUMALOG) 100 UNIT/ML injection Max daily 90 units per pump use  . lisinopril (ZESTRIL) 10 MG tablet Take 1 tablet (10 mg total) by mouth daily.  . metFORMIN (GLUCOPHAGE-XR) 500 MG 24 hr tablet Take 1 tablet (500 mg total) by mouth daily with breakfast.  . Multiple Vitamin (MULTIVITAMIN WITH MINERALS) TABS tablet Take 1 tablet by mouth daily.  . Multiple Vitamins-Minerals (ZINC PO) Take 1 tablet by mouth in the morning and at bedtime.  [name] omeprazole (PRILOSEC) 20 MG capsule Take 20 mg by mouth every evening.  . terbinafine (LAMISIL) 250 MG tablet Take 1 tablet (250 mg total) by mouth daily.  . [DISCONTINUED] diltiazem (CARDIZEM CD) 120 MG 24 hr capsule Take 2 capsules (240 mg total)  by mouth daily.  . [DISCONTINUED] diltiazem (CARDIZEM) 60 MG tablet Every 6 hours as needed up to 3 dose a day  For breakthrough   Along with using Flecainide 100 mg.  . [DISCONTINUED] Insulin Pen Needle 32G X 4 MM MISC Four times daily    Allergies  Allergen Reactions  . Sulfa Antibiotics Hives    SOCIAL HISTORY/FAMILY HISTORY   Reviewed in Epic:  Pertinent findings:  Social History   Tobacco Use  . Smoking status: Former Smoker    Packs/day: 0.50    Years: 21.00    Pack years: 10.50    Types: Cigarettes, E-cigarettes    Quit date: 08/24/2018    Years since quitting: 2.3  . Smokeless tobacco: Never Used  . Tobacco comment: Has now started cutting back vaping as well.  Vaping Use  . Vaping Use: Never used  Substance Use Topics  . Alcohol use: Yes    Comment: rare  . Drug use: No   Social History   Social History Narrative  . Not on file    OBJCTIVE -PE, EKG, labs   Wt Readings from Last 3 Encounters:  11/27/20 259 lb 9.6 oz (117.8 kg)  08/18/20 261 lb 4 oz (118.5 kg)  07/31/20 258 lb 3.2 oz (117.1 kg)    Physical Exam: BP (!) 160/98   Pulse (!) 106   Ht 5\' 8"  (1.727 m)   Wt 259 lb 9.6 oz (117.8 kg)   SpO2 98%   BMI 39.47 kg/m  Physical Exam Vitals reviewed.  Constitutional:      General: She is not in acute distress.    Appearance: She is obese. She is not ill-appearing (Healthy) or toxic-appearing.     Comments: Borderline morbidly obese.  Well-groomed.  HENT:     Head: Normocephalic and atraumatic.  Neck:     Vascular: No carotid bruit or JVD.  Cardiovascular:     Rate and Rhythm: Normal rate and regular rhythm.  No extrasystoles are present.    Chest Wall: PMI is not displaced.     Pulses: Normal pulses.     Heart sounds: S1 normal and S2 normal. Heart sounds are distant. No murmur heard. No gallop.   Pulmonary:     Effort: Pulmonary effort is normal. No respiratory distress.     Breath sounds: Normal breath sounds. No wheezing, rhonchi or  rales.  Chest:     Chest wall: No tenderness.  Musculoskeletal:        General: Swelling (Trivial ankle) present. Normal range of motion.     Cervical back: Normal range of motion and neck supple.  Skin:    General: Skin is warm and dry.  Neurological:     General: No focal deficit present.     Mental Status: She is alert and oriented to person, place, and time.  Psychiatric:        Mood and Affect: Mood normal.        Behavior: Behavior normal.        Thought Content: Thought content normal.        Judgment: Judgment normal.     Adult ECG Report  Rate: 106 ;  Rhythm: sinus tachycardia and Cannot exclude septal infarct, age undetermined.  Otherwise normal axis, intervals and durations.;   Narrative Interpretation: Stable  Recent Labs: Reviewed Lab Results  Component Value Date   HGBA1C 8.6 (H) 07/28/2020   Lab Results  Component Value Date   CHOL 160 08/18/2020   HDL 40.80 08/18/2020   LDLCALC 94 08/18/2020   LDLDIRECT 115.0 06/14/2019   TRIG 123.0 08/18/2020   CHOLHDL 4 08/18/2020   Lab Results  Component Value Date   CREATININE 0.71 08/18/2020   BUN 11 08/18/2020   NA 138 08/18/2020   K 4.5 08/18/2020   CL 101 08/18/2020   CO2 27 08/18/2020   CBC Latest Ref Rng & Units 07/28/2020 02/08/2019 02/08/2019  WBC 4.0 - 10.5 K/uL 11.1(H) - 9.8  Hemoglobin 12.0 - 15.0 g/dL 16.6(H) 15.3(H) 15.9(H)  Hematocrit 36.0 - 46.0 % 46.5(H) 45.0 45.6  Platelets 150 - 400 K/uL 378 - 282    Lab Results  Component Value Date   TSH 2.639 07/28/2020    ==================================================  COVID-19 Education: The signs and symptoms of COVID-19 were discussed with the patient and how to seek care for testing (follow up with PCP or arrange E-visit).    I spent a total of 18 minutes with the patient spent in direct patient consultation.  Additional time spent with chart review  / charting (studies, outside notes, etc): 10 min Total Time: 28 min  Current medicines are  reviewed at length with the patient today.  (+/- concerns) none  This visit occurred during the SARS-CoV-2 public health emergency.  Safety protocols were in place, including screening questions prior to the visit, additional usage of staff PPE, and extensive cleaning of exam room while observing appropriate contact time as indicated for disinfecting solutions.  Notice: This dictation was prepared with Dragon dictation along with smaller phrase technology. Any transcriptional errors that result from this process are unintentional and may not be corrected upon review.  Patient Instructions / Medication Changes & Studies & Tests Ordered   Patient Instructions  Medication Instructions:    change dose to 240 mg  Tablet daily    may use Diltiazem 60 mg  Every 6 hours as needed up to 3 dose a day  For breakthrough   Along with using Flecainide 100 mg.  *If you need a refill on your cardiac medications before your next appointment, please call your pharmacy*   Lab Work: Not needed    Testing/Procedures:  Not needed  Follow-Up: At [facility], you and your health needs are our priority.  As part of our continuing mission to provide you with exceptional heart care, we have created designated Provider Care Teams.  These Care Teams include your primary Cardiologist (physician) and Advanced Practice Providers (APPs -  Physician Assistants and Nurse Practitioners) who all work together to provide you with the care you need, when you need it.     Your next appointment:   12 month(s)  The format for your next appointment:   In Person  Provider:   Bryan Lemmaavid Aprile Dickenson, MD     Studies Ordered:   Orders Placed This Encounter  Procedures  . EKG 12-Lead     Bryan Lemmaavid Malita Ignasiak, M.D., M.S. Interventional Cardiologist   Pager # [phone] Phone # [phone] [address] [place], [place] [address]   Thank you for choosing [facility] at [facility]!!

## 2020-11-27 NOTE — Patient Instructions (Addendum)
Medication Instructions:    change dose to 240 mg  Tablet daily    may use Diltiazem 60 mg  Every 6 hours as needed up to 3 dose a day  For breakthrough   Along with using Flecainide 100 mg.  *If you need a refill on your cardiac medications before your next appointment, please call your pharmacy*   Lab Work: Not needed    Testing/Procedures:  Not needed  Follow-Up: At [facility], you and your health needs are our priority.  As part of our continuing mission to provide you with exceptional heart care, we have created designated Provider Care Teams.  These Care Teams include your primary Cardiologist (physician) and Advanced Practice Providers (APPs -  Physician Assistants and Nurse Practitioners) who all work together to provide you with the care you need, when you need it.     Your next appointment:   12 month(s)  The format for your next appointment:   In Person  Provider:   Bryan Lemma, MD

## 2020-11-28 NOTE — Telephone Encounter (Signed)
Direction  Clarification e-sent to pharmacy . This  Is an addition to standing dose of Diltiazem  240 mg

## 2020-12-03 ENCOUNTER — Telehealth: Payer: Self-pay | Admitting: Nutrition

## 2020-12-03 NOTE — Telephone Encounter (Signed)
LVM to set up an a pump start appointment

## 2020-12-10 NOTE — Telephone Encounter (Signed)
Message left that she will not be able to afford this pump, so has denied this.  She said she will discuss this with Dr.Shamleffter at her next appointment.

## 2020-12-15 ENCOUNTER — Ambulatory Visit: Payer: 59 | Admitting: Internal Medicine

## 2020-12-17 ENCOUNTER — Other Ambulatory Visit: Payer: Self-pay | Admitting: Internal Medicine

## 2020-12-17 MED ORDER — INSULIN PEN NEEDLE 32G X 4 MM MISC: 3 refills | Status: DC

## 2020-12-22 ENCOUNTER — Encounter: Payer: Self-pay | Admitting: Cardiology

## 2020-12-22 NOTE — Assessment & Plan Note (Signed)
If he can consider A. fib hypercoagulable state, she is on standing dose of Eliquis with CHA2DS2-VASc 4 of 3.

## 2020-12-22 NOTE — Assessment & Plan Note (Signed)
1 breakthrough in the last couple years.  For the most part she is unable to break her spells of flecainide, but with this episode she did not have flecainide having run out of her prescription.  I agree that we do not really need to change the course of treatment.  We can increase blood pressure control by increasing diltiazem to 240 mg daily.    Continue PRN flecainide for breakthrough spells: 20 mg x 1 followed 100 mg twice daily for 3 days post breakthrough.

## 2020-12-22 NOTE — Assessment & Plan Note (Signed)
BP high today.  Will increase diltiazem standing dose to 240 mg daily with resting heart rate of 106 bpm.  She is also on lisinopril 10 mg, this could be titrated up if necessary.  Due for follow-up with PCP soon, can reassess as well.

## 2020-12-22 NOTE — Assessment & Plan Note (Signed)
Last lipids had LDL of 94 with HDL of 40.8.  Triglycerides better controlled at 123. She is on atorvastatin 40 mg daily.  Tolerating relatively well.  Goal LDL is less than 100, so she is there.  As she gets older, would like to drive it lower.  She is on Jardiance along with metformin and insulin.  Last A1c was 8.6 indicating not adequately controlled. => Is scheduled to see endocrinology later on this month.

## 2020-12-25 ENCOUNTER — Other Ambulatory Visit: Payer: Self-pay | Admitting: Family Medicine

## 2020-12-25 DIAGNOSIS — E1169 Type 2 diabetes mellitus with other specified complication: Secondary | ICD-10-CM

## 2020-12-26 ENCOUNTER — Other Ambulatory Visit: Payer: Self-pay

## 2020-12-26 ENCOUNTER — Ambulatory Visit: Payer: 59 | Admitting: Podiatry

## 2020-12-26 DIAGNOSIS — L603 Nail dystrophy: Secondary | ICD-10-CM

## 2020-12-26 DIAGNOSIS — Z79899 Other long term (current) drug therapy: Secondary | ICD-10-CM | POA: Diagnosis not present

## 2020-12-26 DIAGNOSIS — M778 Other enthesopathies, not elsewhere classified: Secondary | ICD-10-CM

## 2020-12-30 ENCOUNTER — Encounter: Payer: Self-pay | Admitting: Podiatry

## 2020-12-30 NOTE — Progress Notes (Signed)
Subjective:  Patient ID: Julia Dickerson, female    DOB: [birth year],  MRN: [id number]  Chief Complaint  Patient presents with  . Nail Problem    PT stated that she feels like she still has the nail fungus     45 y.o. female presents with the above complaint.  Patient presents for follow-up of nail fungus to both of the feet.  She states she is doing a lot better.  She has completed course of Lamisil.  She also states injection helped from last time as well.  She is an uncontrolled diabetic.  She denies any other acute complaints.   Review of Systems: Negative except as noted in the HPI. Denies N/V/F/Ch.  Past Medical History:  Diagnosis Date  . Anxiety   . Arthritis   . Atrial fibrillation (HCC)    a. paroxysmal, uses PRN Flecainide  . Depression   . Diabetes mellitus without complication (HCC)   . Dysrhythmia   . GERD (gastroesophageal reflux disease)   . Hypertension   . Obesity   . PCOS (polycystic ovarian syndrome)   . UTI (lower urinary tract infection)     Current Outpatient Medications:  .  apixaban (ELIQUIS) 5 MG TABS tablet, TAKE 1 TABLET (5 MG TOTAL) 2 (TWO) TIMES DAILY BY MOUTH., Disp: 180 tablet, Rfl: 2 .  atorvastatin (LIPITOR) 40 MG tablet, TAKE 1 TABLET BY MOUTH  DAILY, Disp: 90 tablet, Rfl: 1 .  cholecalciferol (VITAMIN D3) 25 MCG (1000 UNIT) tablet, Take 1,000 Units by mouth daily., Disp: , Rfl:  .  Continuous Blood Gluc Receiver (DEXCOM G6 RECEIVER) DEVI, Use as instructed to check blood sugar daily., Disp: 1 each, Rfl: 0 .  Continuous Blood Gluc Sensor (DEXCOM G6 SENSOR) MISC, 1 Device by Does not apply route as directed., Disp: 9 each, Rfl: 3 .  Continuous Blood Gluc Transmit (DEXCOM G6 TRANSMITTER) MISC, 1 Device by Does not apply route as directed., Disp: 1 each, Rfl: 3 .  diltiazem (CARDIZEM CD) 240 MG 24 hr capsule, Take 1 capsule (240 mg total) by mouth daily., Disp: 90 capsule, Rfl: 3 .  diltiazem (CARDIZEM) 60 MG tablet, May take 60 mg  by mouth  every 6 hours as needed up to 3 dose a day for breakthrough along with using Flecainide 100 mg., Disp: 30 tablet, Rfl: 6 .  DULoxetine (CYMBALTA) 30 MG capsule, Take 30 mg by mouth daily., Disp: , Rfl:  .  empagliflozin (JARDIANCE) 10 MG TABS tablet, Take 1 tablet (10 mg total) by mouth daily before breakfast., Disp: 90 tablet, Rfl: 1 .  flecainide (TAMBOCOR) 100 MG tablet, Take 1 tablet (100 mg total) by mouth 2 (two) times daily as needed. For breakthrough afib, Disp: 60 tablet, Rfl: 0 .  fluticasone (FLONASE) 50 MCG/ACT nasal spray, PLACE 1 SPRAY INTO BOTH NOSTRILS DAILY., Disp: 48 mL, Rfl: 1 .  HUMALOG MIX 75/25 KWIKPEN (75-25) 100 UNIT/ML Kwikpen, Inject into the skin 3 (three) times daily before meals., Disp: , Rfl:  .  insulin lispro (HUMALOG) 100 UNIT/ML injection, Max daily 90 units per pump use, Disp: 9 mL, Rfl: 3 .  Insulin Pen Needle 32G X 4 MM MISC, Use as instructed to inject insulin four times daily, Disp: 400 each, Rfl: 3 .  lisinopril (ZESTRIL) 10 MG tablet, Take 1 tablet (10 mg total) by mouth daily., Disp: 90 tablet, Rfl: 0 .  metFORMIN (GLUCOPHAGE-XR) 500 MG 24 hr tablet, Take 1 tablet (500 mg total) by mouth daily with breakfast., Disp:  90 tablet, Rfl: 3 .  Multiple Vitamin (MULTIVITAMIN WITH MINERALS) TABS tablet, Take 1 tablet by mouth daily., Disp: , Rfl:  .  Multiple Vitamins-Minerals (ZINC PO), Take 1 tablet by mouth in the morning and at bedtime., Disp: , Rfl:  .  omeprazole (PRILOSEC) 20 MG capsule, Take 20 mg by mouth every evening., Disp: , Rfl:  .  terbinafine (LAMISIL) 250 MG tablet, Take 1 tablet (250 mg total) by mouth daily., Disp: 90 tablet, Rfl: 0  Social History   Tobacco Use  Smoking Status Former Smoker  . Packs/day: 0.50  . Years: 21.00  . Pack years: 10.50  . Types: Cigarettes, E-cigarettes  . Quit date: 08/24/2018  . Years since quitting: 2.3  Smokeless Tobacco Never Used  Tobacco Comment   Has now started cutting back vaping as well.     Allergies  Allergen Reactions  . Sulfa Antibiotics Hives   Objective:  There were no vitals filed for this visit. There is no height or weight on file to calculate BMI. Constitutional Well developed. Well nourished.  Vascular Dorsalis pedis pulses palpable bilaterally. Posterior tibial pulses palpable bilaterally. Capillary refill normal to all digits.  No cyanosis or clubbing noted. Pedal hair growth normal.  Neurologic Normal speech. Oriented to person, place, and time. Epicritic sensation to light touch grossly present bilaterally.  Dermatologic  clinically improving thickened elongated dystrophic toenails noted to bilateral second digit with mycotic nature to it. Mild pain on palpation. No open wounds. No skin lesions.  Orthopedic:  Now pain on palpation to the left first tarsometatarsal joint.  No pain with range of motion of the first tarsometatarsal joint. No pain at the rest of the midfoot joints.   Radiographs: 3 views of skeletally mature adult left foot: Midfoot arthritis noted with arthritis of first tarsometatarsal joint. No other bony abnormalities identified. No fractures noted. Assessment:   1. Capsulitis of left foot   2. Nail dystrophy   3. Encounter for long-term current use of medication    Plan:  Patient was evaluated and treated and all questions answered.  Bilateral second digit onychomycosis -Clinically improved considerably.  I discussed with her the prevention and management.  At this time we will hold off on any further treatment.  I also discussed shoe gear modification with her as well.  She states understanding.   Left midfoot arthritis -Clinically healed   No follow-ups on file.

## 2021-01-14 IMAGING — DX DG CHEST 1V PORT
1 series · 1 of 1 positions shown · non-contrast
Comparison: 06/13/2018

CLINICAL DATA: Chest pain and shortness of breath.

EXAM:
PORTABLE CHEST 1 VIEW

[chest ap]
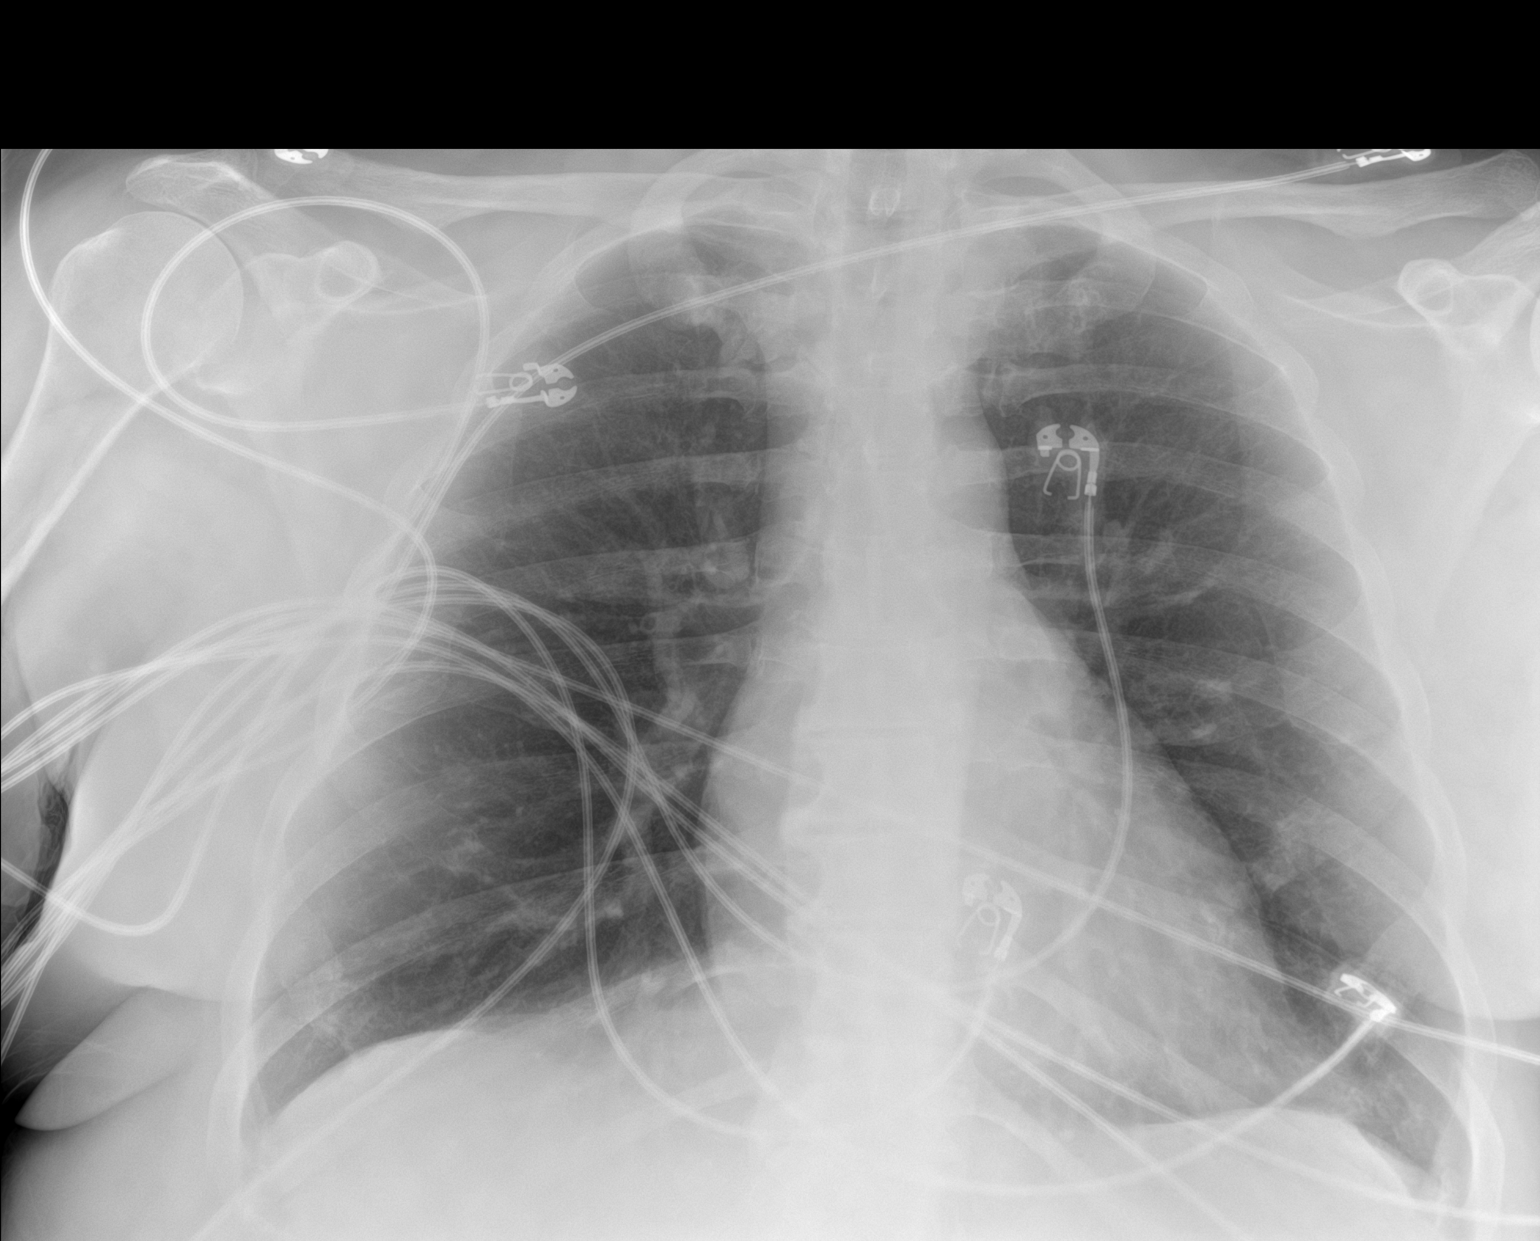

[1 of 1 positions shown; findings below may reference images not displayed]

FINDINGS: 1323 hours. The lungs are clear without focal pneumonia, edema,
pneumothorax or pleural effusion. Cardiopericardial silhouette is at
upper limits of normal for size. The visualized bony structures of
the thorax show no acute abnormality. Telemetry leads overlie the
chest.
IMPRESSION: No active disease.

## 2021-01-27 ENCOUNTER — Other Ambulatory Visit: Payer: Self-pay | Admitting: Internal Medicine

## 2021-02-01 ENCOUNTER — Other Ambulatory Visit: Payer: Self-pay | Admitting: Family Medicine

## 2021-02-01 DIAGNOSIS — I10 Essential (primary) hypertension: Secondary | ICD-10-CM

## 2021-02-06 ENCOUNTER — Other Ambulatory Visit: Payer: Self-pay

## 2021-02-06 DIAGNOSIS — I480 Paroxysmal atrial fibrillation: Secondary | ICD-10-CM

## 2021-02-06 MED ORDER — DILTIAZEM HCL 60 MG PO TABS: ORAL_TABLET | ORAL | 6 refills | Status: DC

## 2021-02-15 ENCOUNTER — Ambulatory Visit
Admission: EM | Admit: 2021-02-15 | Discharge: 2021-02-15 | Disposition: A | Discharge status: Home / Self Care | Payer: 59 | Attending: Urgent Care | Admitting: Urgent Care

## 2021-02-15 DIAGNOSIS — M545 Low back pain, unspecified: Secondary | ICD-10-CM

## 2021-02-15 DIAGNOSIS — I480 Paroxysmal atrial fibrillation: Secondary | ICD-10-CM

## 2021-02-15 DIAGNOSIS — E119 Type 2 diabetes mellitus without complications: Secondary | ICD-10-CM

## 2021-02-15 MED ORDER — TIZANIDINE HCL 4 MG PO TABS: 4.0000 mg | ORAL_TABLET | Freq: Four times a day (QID) | ORAL | 0 refills | Status: DC | PRN

## 2021-02-15 NOTE — Discharge Instructions (Addendum)
Please just use Tylenol at a dose of 500mg -650mg  once every 6 hours as needed for your aches, pains, fevers. Do not use any nonsteroidal anti-inflammatories (NSAIDs) like ibuprofen, Motrin, naproxen, Aleve, etc. which are all available over-the-counter.  It is okay to use Tylenol together with tizanidine.

## 2021-02-15 NOTE — ED Triage Notes (Signed)
Pt presents with lower back pain since yesterday.

## 2021-02-15 NOTE — ED Provider Notes (Signed)
[facility]   MRN: [id number] DOB: [birth year]  Subjective:   Julia Dickerson is a 45 y.o. female presenting for 1 day history of acute onset left-sided low back pain.  Patient denies any fall, trauma, numbness or tingling, weakness.  She has not used any medications given her conditions.  Has type 2 diabetes treated with insulin.  Also has paroxysmal atrial fibrillation.  She is on anticoagulation for this.  Denies any urinary symptoms.  No current facility-administered medications for this encounter.  Current Outpatient Medications:    apixaban (ELIQUIS) 5 MG TABS tablet, TAKE 1 TABLET (5 MG TOTAL) 2 (TWO) TIMES DAILY BY MOUTH., Disp: 180 tablet, Rfl: 2   atorvastatin (LIPITOR) 40 MG tablet, TAKE 1 TABLET BY MOUTH  DAILY, Disp: 90 tablet, Rfl: 1   cholecalciferol (VITAMIN D3) 25 MCG (1000 UNIT) tablet, Take 1,000 Units by mouth daily., Disp: , Rfl:    Continuous Blood Gluc Receiver (DEXCOM G6 RECEIVER) DEVI, Use as instructed to check blood sugar daily., Disp: 1 each, Rfl: 0   Continuous Blood Gluc Sensor (DEXCOM G6 SENSOR) MISC, 1 Device by Does not apply route as directed., Disp: 9 each, Rfl: 3   Continuous Blood Gluc Transmit (DEXCOM G6 TRANSMITTER) MISC, 1 Device by Does not apply route as directed., Disp: 1 each, Rfl: 3   diltiazem (CARDIZEM CD) 240 MG 24 hr capsule, Take 1 capsule (240 mg total) by mouth daily., Disp: 90 capsule, Rfl: 3   diltiazem (CARDIZEM) 60 MG tablet, May take 60 mg  by mouth every 6 hours as needed up to 3 dose a day for breakthrough along with using Flecainide 100 mg., Disp: 30 tablet, Rfl: 6   DULoxetine (CYMBALTA) 30 MG capsule, Take 30 mg by mouth daily., Disp: , Rfl:    empagliflozin (JARDIANCE) 10 MG TABS tablet, Take 1 tablet (10 mg total) by mouth daily before breakfast., Disp: 90 tablet, Rfl: 1   flecainide (TAMBOCOR) 100 MG tablet, Take 1 tablet (100 mg total) by mouth 2 (two) times daily as needed. For breakthrough afib, Disp: 60 tablet,  Rfl: 0   fluticasone (FLONASE) 50 MCG/ACT nasal spray, PLACE 1 SPRAY INTO BOTH NOSTRILS DAILY., Disp: 48 mL, Rfl: 1   HUMALOG MIX 75/25 KWIKPEN (75-25) 100 UNIT/ML Kwikpen, INJECT SUBCUTANEOUSLY 32  UNITS WITH BREAKFAST AND 36 UNITS WITH DINNER, Disp: 75 mL, Rfl: 3   insulin lispro (HUMALOG) 100 UNIT/ML injection, Max daily 90 units per pump use, Disp: 9 mL, Rfl: 3   Insulin Pen Needle 32G X 4 MM MISC, Use as instructed to inject insulin four times daily, Disp: 400 each, Rfl: 3   lisinopril (ZESTRIL) 10 MG tablet, TAKE 1 TABLET BY MOUTH  DAILY, Disp: 60 tablet, Rfl: 0   metFORMIN (GLUCOPHAGE-XR) 500 MG 24 hr tablet, Take 1 tablet (500 mg total) by mouth daily with breakfast., Disp: 90 tablet, Rfl: 3   Multiple Vitamin (MULTIVITAMIN WITH MINERALS) TABS tablet, Take 1 tablet by mouth daily., Disp: , Rfl:    Multiple Vitamins-Minerals (ZINC PO), Take 1 tablet by mouth in the morning and at bedtime., Disp: , Rfl:    omeprazole (PRILOSEC) 20 MG capsule, Take 20 mg by mouth every evening., Disp: , Rfl:    terbinafine (LAMISIL) 250 MG tablet, Take 1 tablet (250 mg total) by mouth daily., Disp: 90 tablet, Rfl: 0   Allergies  Allergen Reactions   Sulfa Antibiotics Hives    Past Medical History:  Diagnosis Date   Anxiety  Arthritis    Atrial fibrillation (HCC)    a. paroxysmal, uses PRN Flecainide   Depression    Diabetes mellitus without complication (HCC)    Dysrhythmia    GERD (gastroesophageal reflux disease)    Hypertension    Obesity    PCOS (polycystic ovarian syndrome)    UTI (lower urinary tract infection)      Past Surgical History:  Procedure Laterality Date   DILATION AND CURETTAGE OF UTERUS  2011 and 2012   x2   GXT - Graded Exercise Tolerance Test  07/22/2016   Exercise time 6 minutes - 7 METs. Maximum heart rate 146 BPM. - No EKG changes. No arrhythmias or chest pain.: Low risk test.   TRANSTHORACIC ECHOCARDIOGRAM  06/2016   EF 6570% with mild LVH. Normal wall motion  and diastolic function. Mild RAE. Otherwise normal.   WISDOM TOOTH EXTRACTION      Family History  Problem Relation Age of Onset   Heart failure Father    Heart attack Father    Cancer Father    Diabetes Father    Hearing loss Father    Hyperlipidemia Father    Hypertension Father    Arthritis Mother    Diabetes Mother    COPD Maternal Grandmother    Heart disease Maternal Grandmother    Hyperlipidemia Maternal Grandmother    Hyperlipidemia Maternal Grandfather    Hypertension Maternal Grandfather    Hearing loss Maternal Grandfather    Alcohol abuse Maternal Grandfather    Arthritis Maternal Grandfather    Stroke Maternal Grandfather    Hyperlipidemia Paternal Grandmother    Miscarriages / Stillbirths Paternal Grandmother    Kidney disease Paternal Grandfather    Hypertension Paternal Grandfather    Heart attack Paternal Grandfather    Diabetes Paternal Grandfather    Arthritis Sister     Social History   Tobacco Use   Smoking status: Former    Packs/day: 0.50    Years: 21.00    Pack years: 10.50    Types: Cigarettes, E-cigarettes    Quit date: 08/24/2018    Years since quitting: 2.4   Smokeless tobacco: Never   Tobacco comments:    Has now started cutting back vaping as well.  Vaping Use   Vaping Use: Never used  Substance Use Topics   Alcohol use: Yes    Comment: rare   Drug use: No    ROS   Objective:   Vitals: BP (!) 143/83 (BP Location: Left Arm)   Pulse 97   Temp 98.8 F (37.1 C) (Oral)   Resp 20   SpO2 98%   Physical Exam Constitutional:      General: She is not in acute distress.    Appearance: Normal appearance. She is well-developed. She is not ill-appearing, toxic-appearing or diaphoretic.  HENT:     Head: Normocephalic and atraumatic.     Nose: Nose normal.     Mouth/Throat:     Mouth: Mucous membranes are moist.     Pharynx: Oropharynx is clear.  Eyes:     General: No scleral icterus.    Extraocular Movements: Extraocular  movements intact.     Pupils: Pupils are equal, round, and reactive to light.  Cardiovascular:     Rate and Rhythm: Normal rate.  Pulmonary:     Effort: Pulmonary effort is normal.  Musculoskeletal:     Lumbar back: Spasms and tenderness (Over spasms as outlined) present. No swelling, edema, deformity, signs of trauma, lacerations or bony  tenderness. Normal range of motion. Negative right straight leg raise test and negative left straight leg raise test. No scoliosis.       Back:     Comments: No midline tenderness.  Skin:    General: Skin is warm and dry.  Neurological:     General: No focal deficit present.     Mental Status: She is alert and oriented to person, place, and time.     Motor: No weakness.     Coordination: Coordination normal.     Gait: Gait normal.     Deep Tendon Reflexes: Reflexes normal.  Psychiatric:        Mood and Affect: Mood normal.        Behavior: Behavior normal.        Thought Content: Thought content normal.        Judgment: Judgment normal.    Assessment and Plan :   PDMP not reviewed this encounter.  1. Acute left-sided low back pain without sciatica   2. Paroxysmal atrial fibrillation (HCC)   3. Type 2 diabetes mellitus treated with insulin (HCC)     Low suspicion for an acute process.  Recommended conservative management given her chronic conditions with Tylenol and tizanidine, better hydration back care.  As there is no trauma, midline tenderness deferred imaging. Counseled patient on potential for adverse effects with medications prescribed/recommended today, ER and return-to-clinic precautions discussed, patient verbalized understanding.    Wallis Bamberg, PA-C 02/15/21 1406

## 2021-02-18 ENCOUNTER — Ambulatory Visit: Payer: 59 | Admitting: Internal Medicine

## 2021-02-19 ENCOUNTER — Encounter: Payer: 59 | Admitting: Family Medicine

## 2021-03-11 ENCOUNTER — Encounter: Payer: Self-pay | Admitting: Family Medicine

## 2021-03-11 ENCOUNTER — Ambulatory Visit (INDEPENDENT_AMBULATORY_CARE_PROVIDER_SITE_OTHER): Payer: 59 | Admitting: Family Medicine

## 2021-03-11 ENCOUNTER — Other Ambulatory Visit: Payer: Self-pay

## 2021-03-11 VITALS — BP 140/102 | HR 91 | Temp 98.3°F | Ht 68.0 in | Wt 257.0 lb

## 2021-03-11 DIAGNOSIS — Z13220 Encounter for screening for lipoid disorders: Secondary | ICD-10-CM

## 2021-03-11 DIAGNOSIS — B379 Candidiasis, unspecified: Secondary | ICD-10-CM

## 2021-03-11 DIAGNOSIS — E1165 Type 2 diabetes mellitus with hyperglycemia: Secondary | ICD-10-CM

## 2021-03-11 DIAGNOSIS — Z0000 Encounter for general adult medical examination without abnormal findings: Secondary | ICD-10-CM | POA: Diagnosis not present

## 2021-03-11 DIAGNOSIS — I10 Essential (primary) hypertension: Secondary | ICD-10-CM | POA: Diagnosis not present

## 2021-03-11 DIAGNOSIS — G4733 Obstructive sleep apnea (adult) (pediatric): Secondary | ICD-10-CM

## 2021-03-11 DIAGNOSIS — L02214 Cutaneous abscess of groin: Secondary | ICD-10-CM

## 2021-03-11 DIAGNOSIS — Z794 Long term (current) use of insulin: Secondary | ICD-10-CM | POA: Diagnosis not present

## 2021-03-11 DIAGNOSIS — Z87891 Personal history of nicotine dependence: Secondary | ICD-10-CM

## 2021-03-11 DIAGNOSIS — T3695XA Adverse effect of unspecified systemic antibiotic, initial encounter: Secondary | ICD-10-CM

## 2021-03-11 DIAGNOSIS — M65311 Trigger thumb, right thumb: Secondary | ICD-10-CM

## 2021-03-11 LAB — CBC WITH DIFFERENTIAL/PLATELET
Basophils Absolute: 0 10*3/uL (ref 0.0–0.1)
Basophils Relative: 0.6 % (ref 0.0–3.0)
Eosinophils Absolute: 0.2 10*3/uL (ref 0.0–0.7)
Eosinophils Relative: 3 % (ref 0.0–5.0)
HCT: 45.7 % (ref 36.0–46.0)
Hemoglobin: 15.7 g/dL — ABNORMAL HIGH (ref 12.0–15.0)
Lymphocytes Relative: 27.3 % (ref 12.0–46.0)
Lymphs Abs: 2.3 10*3/uL (ref 0.7–4.0)
MCHC: 34.4 g/dL (ref 30.0–36.0)
MCV: 89.3 fl (ref 78.0–100.0)
Monocytes Absolute: 0.4 10*3/uL (ref 0.1–1.0)
Monocytes Relative: 4.3 % (ref 3.0–12.0)
Neutro Abs: 5.3 10*3/uL (ref 1.4–7.7)
Neutrophils Relative %: 64.8 % (ref 43.0–77.0)
Platelets: 282 10*3/uL (ref 150.0–400.0)
RBC: 5.11 Mil/uL (ref 3.87–5.11)
RDW: 13.5 % (ref 11.5–15.5)
WBC: 8.3 10*3/uL (ref 4.0–10.5)

## 2021-03-11 LAB — BASIC METABOLIC PANEL
BUN: 15 mg/dL (ref 6–23)
CO2: 23 mEq/L (ref 19–32)
Calcium: 9.7 mg/dL (ref 8.4–10.5)
Chloride: 103 mEq/L (ref 96–112)
Creatinine, Ser: 0.7 mg/dL (ref 0.40–1.20)
GFR: 105.05 mL/min (ref >60.00)
Glucose, Bld: 160 mg/dL — ABNORMAL HIGH (ref 70–99)
Potassium: 4.2 mEq/L (ref 3.5–5.1)
Sodium: 139 mEq/L (ref 135–145)

## 2021-03-11 LAB — HEMOGLOBIN A1C: Hgb A1c MFr Bld: 8.3 % — ABNORMAL HIGH (ref 4.6–6.5)

## 2021-03-11 LAB — T4, FREE: Free T4: 1.07 ng/dL (ref 0.60–1.60)

## 2021-03-11 LAB — LIPID PANEL
Cholesterol: 220 mg/dL — ABNORMAL HIGH (ref 0–200)
HDL: 43.5 mg/dL (ref >39.00)
LDL Cholesterol: 143 mg/dL — ABNORMAL HIGH (ref 0–99)
NonHDL: 176.03
Total CHOL/HDL Ratio: 5
Triglycerides: 166 mg/dL — ABNORMAL HIGH (ref 0.0–149.0)
VLDL: 33.2 mg/dL (ref 0.0–40.0)

## 2021-03-11 LAB — TSH: TSH: 0.98 u[IU]/mL (ref 0.35–5.50)

## 2021-03-11 MED ORDER — AMOXICILLIN-POT CLAVULANATE 500-125 MG PO TABS: 1.0000 | ORAL_TABLET | Freq: Two times a day (BID) | ORAL | 0 refills | Status: AC

## 2021-03-11 MED ORDER — FLUCONAZOLE 150 MG PO TABS: 150.0000 mg | ORAL_TABLET | Freq: Once | ORAL | 0 refills | Status: AC

## 2021-03-11 NOTE — Progress Notes (Signed)
Subjective:     Julia Dickerson is a 45 y.o. female and is here for a comprehensive physical exam. The patient reports R thumb gets stuck in flexion, painful at times.  Pt no longer smoking cigarettes x last 2 yrs. use nicotine pouches to help her quit.  Patient endorses frequent painful bumps/boils in his groin.  Has 1 present now.  History of OSA.  Not currently wearing CPAP.  Pap 11/10/2018.  Social History   Socioeconomic History   Marital status: Married    Spouse name: Not on file   Number of children: 0   Years of education: Not on file   Highest education level: Not on file  Occupational History   Not on file  Tobacco Use   Smoking status: Former    Packs/day: 0.50    Years: 21.00    Pack years: 10.50    Types: Cigarettes, E-cigarettes    Quit date: 08/24/2018    Years since quitting: 2.5   Smokeless tobacco: Never   Tobacco comments:    Has now started cutting back vaping as well.  Vaping Use   Vaping Use: Never used  Substance and Sexual Activity   Alcohol use: Yes    Comment: rare   Drug use: No   Sexual activity: Yes    Partners: Male    Birth control/protection: None    Comment: 1st intercourse- 18, partners- 8, married- 18 yrs   Other Topics Concern   Not on file  Social History Narrative   Not on file   Social Determinants of Health   Financial Resource Strain: Not on file  Food Insecurity: Not on file  Transportation Needs: Not on file  Physical Activity: Not on file  Stress: Not on file  Social Connections: Not on file  Intimate Partner Violence: Not on file   Health Maintenance  Topic Date Due   PNEUMOCOCCAL POLYSACCHARIDE VACCINE AGE 44-64 HIGH RISK  Never done   Pneumococcal Vaccine 92-62 Years old (1 - PCV) Never done   Hepatitis C Screening  Never done   TETANUS/TDAP  Never done   OPHTHALMOLOGY EXAM  09/16/2019   FOOT EXAM  10/07/2020   COVID-19 Vaccine (4 - Booster for Moderna series) 01/09/2021   HEMOGLOBIN A1C  01/25/2021   INFLUENZA  VACCINE  02/23/2021   PAP SMEAR-Modifier  11/09/2021   HIV Screening  Completed   HPV VACCINES  Aged Out    The following portions of the patient's history were reviewed and updated as appropriate: allergies, current medications, past family history, past medical history, past social history, past surgical history, and problem list.  Review of Systems Pertinent items noted in HPI and remainder of comprehensive ROS otherwise negative.   Objective:    BP (!) 140/102 (BP Location: Right Arm, Patient Position: Sitting, Cuff Size: Normal)   Pulse 91   Temp 98.3 F (36.8 C) (Oral)   Ht 5\' 8"  (1.727 m)   Wt 257 lb (116.6 kg)   SpO2 98%   BMI 39.08 kg/m  General appearance: alert, cooperative, and no distress Head: Normocephalic, without obvious abnormality, atraumatic Eyes: conjunctivae/corneas clear. PERRL, EOM's intact. Fundi benign. Ears: normal TM's and external ear canals both ears Nose: Nares normal. Septum midline. Mucosa normal. No drainage or sinus tenderness. Throat: lips, mucosa, and tongue normal; teeth and gums normal Neck: no adenopathy, no carotid bruit, no JVD, supple, symmetrical, trachea midline, and thyroid not enlarged, symmetric, no tenderness/mass/nodules Lungs: clear to auscultation bilaterally Heart: regular rate  and rhythm, S1, S2 normal, no murmur, click, rub or gallop Abdomen: soft, non-tender; bowel sounds normal; no masses,  no organomegaly Extremities: extremities normal, atraumatic, no cyanosis or edema Pulses: 2+ and symmetric Skin:  Warm, dry, intact.  1.5 cm area of fluctuance and erythema in right upper mons pubis Lymph nodes: Cervical, supraclavicular, and axillary nodes normal. Neurologic: Alert and oriented X 3, normal strength and tone. Normal symmetric reflexes. Normal coordination and gait    PHQ9 SCORE ONLY 03/11/2021 10/02/2018  PHQ-9 Total Score 9 0   GAD 7 : Generalized Anxiety Score 03/11/2021  Nervous, Anxious, on Edge 1  Control/stop  worrying 1  Worry too much - different things 1  Trouble relaxing 1  Restless 0  Easily annoyed or irritable 0  Afraid - awful might happen 0  Total GAD 7 Score 4  Anxiety Difficulty Not difficult at all      Incision and Drainage Procedure Note  Pre-operative Diagnosis: Abscess  Post-operative Diagnosis: same  Indications: Painful nondraining abscess  Anesthesia: 1% plain lidocaine  Procedure Details  The procedure, risks and complications have been discussed in detail (including, but not limited to airway compromise, infection, bleeding) with the patient, and the patient has signed consent to the procedure.  The skin was sterilely prepped and draped over the affected area in the usual fashion. After adequate local anesthesia, I&D with a #11 blade was performed on the right, upper mons pubis. Purulent drainage: present The patient was observed until stable.  EBL: Minimal cc's  Condition: Tolerated procedure well and Stable   Complications: none.   Assessment:    Healthy female exam.      Plan:    Anticipatory guidance given including wearing seatbelts, smoke detectors in the home, increasing physical activity, increasing p.o. intake of water and vegetables. -PHQ-9 score 9.  GAD-7 score 4 -Obtain labs -Patient to schedule mammogram -Followed by OB/GYN for Pap -Given handout -Next CPE in 1 year See After Visit Summary for Counseling Recommendations   Essential hypertension  -Uncontrolled -Currently asymptomatic -Recheck BP. -Discussed the importance taking medications regularly -Discussed the importance of lifestyle modifications -Continue follow-up with cardiology - Plan: Basic metabolic panel  Screening for cholesterol level  Lifestyle modifications - Plan: Lipid panel  Type 2 diabetes mellitus with hyperglycemia, with long-term current use of insulin (HCC)  -Hemoglobin A1c 8.6% on 07/28/2020 -Lifestyle modifications encouraged -Continue Humalog  75/25 32 units with breakfast and 36 units with dinner -Continue follow-up with nutrition and endocrinology- Plan: Hemoglobin A1c  OSA (obstructive sleep apnea) -Discussed the importance of wearing CPAP nightly and when taking naps -Weight loss encouraged  Trigger finger of right thumb -Discussed treatment options -Patient considering Ortho.  Abscess of groin, right  -Consent obtained.  Abscess I&D.  Patient tolerated procedure well. -Given handout -Start Augmentin - Plan: CBC with Differential/Platelet, amoxicillin-clavulanate (AUGMENTIN) 500-125 MG tablet  Former smoker -Congratulated on smoking cessation -Patient encouraged to continue cigarette use -Continue to monitor  Antibiotic-induced yeast infection  - Plan: fluconazole (DIFLUCAN) 150 MG tablet  Morbid obesity (HCC) -Lifestyle modification strongly encouraged  - Plan: TSH, T4, Free  Follow-up in 1-2 months  Abbe Amsterdam, MD

## 2021-03-18 ENCOUNTER — Encounter: Payer: Self-pay | Admitting: Family Medicine

## 2021-03-24 ENCOUNTER — Other Ambulatory Visit: Payer: Self-pay | Admitting: Family Medicine

## 2021-03-24 DIAGNOSIS — Z1231 Encounter for screening mammogram for malignant neoplasm of breast: Secondary | ICD-10-CM

## 2021-03-26 ENCOUNTER — Other Ambulatory Visit: Payer: Self-pay | Admitting: Family Medicine

## 2021-03-26 DIAGNOSIS — I10 Essential (primary) hypertension: Secondary | ICD-10-CM

## 2021-04-02 ENCOUNTER — Other Ambulatory Visit: Payer: Self-pay | Admitting: Internal Medicine

## 2021-04-08 ENCOUNTER — Other Ambulatory Visit: Payer: Self-pay

## 2021-04-08 DIAGNOSIS — I480 Paroxysmal atrial fibrillation: Secondary | ICD-10-CM

## 2021-04-08 MED ORDER — DILTIAZEM HCL 60 MG PO TABS: ORAL_TABLET | ORAL | 6 refills | Status: DC

## 2021-04-24 ENCOUNTER — Ambulatory Visit: Payer: 59 | Admitting: Internal Medicine

## 2021-05-04 ENCOUNTER — Other Ambulatory Visit: Payer: Self-pay

## 2021-05-04 ENCOUNTER — Ambulatory Visit
Admission: RE | Admit: 2021-05-04 | Discharge: 2021-05-04 | Disposition: A | Discharge status: Home / Self Care | Payer: 59 | Source: Ambulatory Visit | Attending: Family Medicine | Admitting: Family Medicine

## 2021-05-04 DIAGNOSIS — Z1231 Encounter for screening mammogram for malignant neoplasm of breast: Secondary | ICD-10-CM

## 2021-05-05 ENCOUNTER — Other Ambulatory Visit: Payer: Self-pay | Admitting: Family Medicine

## 2021-05-05 ENCOUNTER — Other Ambulatory Visit: Payer: Self-pay | Admitting: Physician Assistant

## 2021-05-05 DIAGNOSIS — E1169 Type 2 diabetes mellitus with other specified complication: Secondary | ICD-10-CM

## 2021-05-05 DIAGNOSIS — E785 Hyperlipidemia, unspecified: Secondary | ICD-10-CM

## 2021-05-05 NOTE — Telephone Encounter (Signed)
Prescription refill request for Eliquis received. Indication:afib Last office visit:[facility] 11/27/20 Scr: 0.7 03/11/21 Age: 30f Weight:116.6kg

## 2021-05-07 ENCOUNTER — Other Ambulatory Visit: Payer: Self-pay

## 2021-05-07 ENCOUNTER — Encounter: Payer: Self-pay | Admitting: Internal Medicine

## 2021-05-07 ENCOUNTER — Ambulatory Visit: Payer: 59 | Admitting: Internal Medicine

## 2021-05-07 VITALS — BP 126/78 | HR 70 | Ht 68.0 in | Wt 254.8 lb

## 2021-05-07 DIAGNOSIS — I480 Paroxysmal atrial fibrillation: Secondary | ICD-10-CM

## 2021-05-07 DIAGNOSIS — E1165 Type 2 diabetes mellitus with hyperglycemia: Secondary | ICD-10-CM | POA: Diagnosis not present

## 2021-05-07 DIAGNOSIS — E785 Hyperlipidemia, unspecified: Secondary | ICD-10-CM | POA: Diagnosis not present

## 2021-05-07 DIAGNOSIS — Z794 Long term (current) use of insulin: Secondary | ICD-10-CM

## 2021-05-07 DIAGNOSIS — E1140 Type 2 diabetes mellitus with diabetic neuropathy, unspecified: Secondary | ICD-10-CM

## 2021-05-07 LAB — POCT GLYCOSYLATED HEMOGLOBIN (HGB A1C): Hemoglobin A1C: 7.7 % — AB (ref 4.0–5.6)

## 2021-05-07 MED ORDER — DILTIAZEM HCL ER COATED BEADS 240 MG PO CP24: 240.0000 mg | ORAL_CAPSULE | Freq: Every day | ORAL | 3 refills | Status: DC

## 2021-05-07 MED ORDER — EMPAGLIFLOZIN 25 MG PO TABS: 25.0000 mg | ORAL_TABLET | Freq: Every day | ORAL | 3 refills | Status: DC

## 2021-05-07 MED ORDER — INSULIN LISPRO PROT & LISPRO (75-25 MIX) 100 UNIT/ML KWIKPEN: PEN_INJECTOR | SUBCUTANEOUS | 4 refills | Status: DC

## 2021-05-07 NOTE — Patient Instructions (Signed)
-   Continue  Metformin 500 mg, 1 tablet daily  - Continue  Novolog Mix 40 units with First and 44 units with Supper  - Increase Jardiance 25  mg, 1 tablet daily with breakfast      - HOW TO TREAT LOW BLOOD SUGARS (Blood sugar LESS THAN 70 MG/DL) Please follow the RULE OF 15 for the treatment of hypoglycemia treatment (when your (blood sugars are less than 70 mg/dL)   STEP 1: Take 15 grams of carbohydrates when your blood sugar is low, which includes:  3-4 GLUCOSE TABS  OR 3-4 OZ OF JUICE OR REGULAR SODA OR ONE TUBE OF GLUCOSE GEL    STEP 2: RECHECK blood sugar in 15 MINUTES STEP 3: If your blood sugar is still low at the 15 minute recheck --> then, go back to STEP 1 and treat AGAIN with another 15 grams of carbohydrates.

## 2021-05-07 NOTE — Progress Notes (Signed)
Name: Julia Dickerson  Age/ Sex: 45 y.o., female   MRN/ DOB: [id number], Sep 25, 1975     PCP: Deeann Saint, MD   Reason for Endocrinology Evaluation: Type 2 diabetes Mellitus  Initial Endocrine Consultative Visit:  09/05/2018    PATIENT IDENTIFIER: [name] is a 45 y.o. female with a past medical history of PCOS, T2DM, PAF and HTN.  The patient has followed with Endocrinology clinic since 09/05/2018 for consultative assistance with management of her diabetes.  DIABETIC HISTORY:  [name] was diagnosed with T2DM since the 2000's.  She was initially on metformin, she was switched to MDI insulin regimen which helped control her hyperglycemia at the time, the patient was subsequently switched to oral glycemic agents in 2012 due to improved glucose control.  She was restarted on basal insulin in 2019 due to persistent hyperglycemia, she has tried Victoza in the past with severe nausea. Her hemoglobin A1c has ranged from 8.7% in in 2019, peaking at 10.7% in 08/14/2018.   Prandial insulin started in February 2020, in addition to continuing her metformin and basal insulin. Rybelsus is cost prohibitive   By 05/2019 we switched her MDI regimen to insulin mix, we started Trulicity, and continued metformin.  Trulicity stopped 1/2/021 due to GI side effects  Jardiance started 07/2020  SUBJECTIVE:   During the last visit (08/18/2020): A1c 8.6 %.  We stopped trulicity, continued  metformin , adjusted novolog mix and started Jardiance       Today (05/07/2021): Julia Dickerson is here for a follow up on diabetes management. She checks glucose multiple x a day, she has noted  hypoglycemic episodes since her last clinic visit, usually in the afternoon.   GI side effects improved with discontinuation of Trulicity  She has been having back issues      CONTINUOUS GLUCOSE MONITORING RECORD INTERPRETATION    Dates of Recording: 9/30-10/13/2022  Sensor description:dexcom   Results  statistics:   CGM use % of time 93%  Average and SD 256/55  Time in range 8     %  % Time Above 180 42  % Time above 250 50  % Time Below target 0     Glycemic patterns summary: Hyperglycemia has been noted through the day and the night  Hyperglycemic episodes starts with postprandial hyperglycemia and remains to the day and night  Hypoglycemic episodes occurred N/A  Overnight periods: High    HOME DIABETES REGIMEN:  NovoLog mix 40  units with Breakfast and 44 units with Supper  Metformin 500 mg XR 1 tab daily  Jardiance 10 mg daily     DIABETIC COMPLICATIONS: Microvascular complications:  Neuropathy  Denies: CKD, retinopathy  Last eye exam: Completed 10/08/2019   Macrovascular complications:    Denies: CAD, PVD, CVA     HISTORY:  Past Medical History:  Past Medical History:  Diagnosis Date   Anxiety    Arthritis    Atrial fibrillation (HCC)    a. paroxysmal, uses PRN Flecainide   Depression    Diabetes mellitus without complication (HCC)    Dysrhythmia    GERD (gastroesophageal reflux disease)    Hypertension    Obesity    PCOS (polycystic ovarian syndrome)    UTI (lower urinary tract infection)    Past Surgical History:  Past Surgical History:  Procedure Laterality Date   DILATION AND CURETTAGE OF UTERUS  2011 and 2012   x2   GXT - Graded Exercise Tolerance Test  07/22/2016  Exercise time 6 minutes - 7 METs. Maximum heart rate 146 BPM. - No EKG changes. No arrhythmias or chest pain.: Low risk test.   TRANSTHORACIC ECHOCARDIOGRAM  06/2016   EF 6570% with mild LVH. Normal wall motion and diastolic function. Mild RAE. Otherwise normal.   WISDOM TOOTH EXTRACTION     Social History:  reports that she quit smoking about 2 years ago. Her smoking use included cigarettes and e-cigarettes. She has a 10.50 pack-year smoking history. She has never used smokeless tobacco. She reports current alcohol use. She reports that she does not use drugs. Family  History:  Family History  Problem Relation Age of Onset   Arthritis Mother    Diabetes Mother    Heart failure Father    Heart attack Father    Cancer Father    Diabetes Father    Hearing loss Father    Hyperlipidemia Father    Hypertension Father    Arthritis Sister    COPD Maternal Grandmother    Heart disease Maternal Grandmother    Hyperlipidemia Maternal Grandmother    Hyperlipidemia Maternal Grandfather    Hypertension Maternal Grandfather    Hearing loss Maternal Grandfather    Alcohol abuse Maternal Grandfather    Arthritis Maternal Grandfather    Stroke Maternal Grandfather    Hyperlipidemia Paternal Grandmother    Miscarriages / Stillbirths Paternal Grandmother    Kidney disease Paternal Grandfather    Hypertension Paternal Grandfather    Heart attack Paternal Grandfather    Diabetes Paternal Grandfather    Breast cancer Neg Hx      HOME MEDICATIONS: Allergies as of 05/07/2021       Reactions   Sulfa Antibiotics Hives        Medication List        Accurate as of May 07, 2021 10:33 AM. If you have any questions, ask your nurse or doctor.          STOP taking these medications    Jardiance 10 MG Tabs tablet Generic drug: empagliflozin Stopped by: Scarlette Shorts, MD       TAKE these medications    apixaban 5 MG Tabs tablet Commonly known as: Eliquis TAKE 1 TABLET BY MOUTH  TWICE DAILY   atorvastatin 40 MG tablet Commonly known as: LIPITOR TAKE 1 TABLET BY MOUTH  DAILY   cholecalciferol 25 MCG (1000 UNIT) tablet Commonly known as: VITAMIN D3 Take 1,000 Units by mouth daily.   Dexcom G6 Receiver Devi Use as instructed to check blood sugar daily.   Dexcom G6 Sensor Misc 1 Device by Does not apply route as directed.   Dexcom G6 Transmitter Misc 1 Device by Does not apply route as directed.   diltiazem 240 MG 24 hr capsule Commonly known as: CARDIZEM CD Take 1 capsule (240 mg total) by mouth daily.   diltiazem 60 MG  tablet Commonly known as: CARDIZEM May take 60 mg  by mouth every 6 hours as needed up to 3 dose a day for breakthrough along with using Flecainide 100 mg.   DULoxetine 30 MG capsule Commonly known as: CYMBALTA Take 30 mg by mouth daily.   flecainide 100 MG tablet Commonly known as: TAMBOCOR Take 1 tablet (100 mg total) by mouth 2 (two) times daily as needed. For breakthrough afib   fluticasone 50 MCG/ACT nasal spray Commonly known as: FLONASE PLACE 1 SPRAY INTO BOTH NOSTRILS DAILY.   HumaLOG Mix 75/25 KwikPen (75-25) 100 UNIT/ML Kwikpen Generic drug: Insulin Lispro Prot &  Lispro INJECT SUBCUTANEOUSLY 32  UNITS WITH BREAKFAST AND 36 UNITS WITH DINNER   insulin lispro 100 UNIT/ML injection Commonly known as: HUMALOG Max daily 90 units per pump use   Insulin Pen Needle 32G X 4 MM Misc Use as instructed to inject insulin four times daily   lisinopril 10 MG tablet Commonly known as: ZESTRIL TAKE 1 TABLET BY MOUTH  DAILY   metFORMIN 500 MG 24 hr tablet Commonly known as: GLUCOPHAGE-XR Take 1 tablet (500 mg total) by mouth daily with breakfast.   multivitamin with minerals Tabs tablet Take 1 tablet by mouth daily.   omeprazole 20 MG capsule Commonly known as: PRILOSEC Take 20 mg by mouth every evening.   terbinafine 250 MG tablet Commonly known as: LamISIL Take 1 tablet (250 mg total) by mouth daily.   tiZANidine 4 MG tablet Commonly known as: Zanaflex Take 1 tablet (4 mg total) by mouth every 6 (six) hours as needed for muscle spasms.   ZINC PO Take 1 tablet by mouth in the morning and at bedtime.         OBJECTIVE:   Vital Signs: BP 126/78 (BP Location: Left Arm, Patient Position: Sitting, Cuff Size: Small)   Pulse 70   Ht 5\' 8"  (1.727 m)   Wt 254 lb 12.8 oz (115.6 kg)   SpO2 98%   BMI 38.74 kg/m   Wt Readings from Last 3 Encounters:  05/07/21 254 lb 12.8 oz (115.6 kg)  03/11/21 257 lb (116.6 kg)  11/27/20 259 lb 9.6 oz (117.8 kg)      Exam: General: Pt appears well and is in NAD  Lungs: Clear with good BS bilat   Heart: RRR   Abdomen: Normoactive bowel sounds, soft, nontender, without masses or organomegaly palpable  Extremities: No pretibial edema.   Neuro: MS is good with appropriate affect, pt is alert and Ox3        DM foot exam: 05/07/2021 The skin of the feet is intact without sores or ulcerations. The pedal pulses are 2+ on right and 2+ on left. The sensation is absent  to a screening 5.07, 10 gram monofilament bilaterally       DATA REVIEWED:  Lab Results  Component Value Date   HGBA1C 8.3 (H) 03/11/2021   HGBA1C 8.6 (H) 07/28/2020   HGBA1C 8.1 (A) 02/11/2020    Results for DESTINE, ZIRKLE (MRN Arnetha Courser) as of 05/07/2021 12:39  Ref. Range 03/11/2021 10:34  Sodium Latest Ref Range: 135 - 145 mEq/L 139  Potassium Latest Ref Range: 3.5 - 5.1 mEq/L 4.2  Chloride Latest Ref Range: 96 - 112 mEq/L 103  CO2 Latest Ref Range: 19 - 32 mEq/L 23  Glucose Latest Ref Range: 70 - 99 mg/dL 03/13/2021 (H)  BUN Latest Ref Range: 6 - 23 mg/dL 15  Creatinine Latest Ref Range: 0.40 - 1.20 mg/dL 109  Calcium Latest Ref Range: 8.4 - 10.5 mg/dL 9.7  GFR Latest Ref Range: >60.00 mL/min 105.05  Total CHOL/HDL Ratio Unknown 5  Cholesterol Latest Ref Range: 0 - 200 mg/dL 3.23 (H)  HDL Cholesterol Latest Ref Range: >39.00 mg/dL 557  LDL (calc) Latest Ref Range: 0 - 99 mg/dL 32.20 (H)  NonHDL Unknown 176.03  Triglycerides Latest Ref Range: 0.0 - 149.0 mg/dL 254 (H)  VLDL Latest Ref Range: 0.0 - 40.0 mg/dL 270.6     ASSESSMENT / PLAN / RECOMMENDATIONS:   1) Type 2 Diabetes Mellitus, suboptimally controlled , With Neuropathic Complications - Most recent A1c of 7.7 %.  Goal A1c < 7.0 %.    -Praised the patient on improved glycemic control -She is intolerant to Trulicity and higher doses of metformin - Will stop Trulicity and start an SGLT-2 inhibitors  , cautioned against genital infections  -We will increase  Jardiance but continue insulin and metformin  MEDICATIONS: - Continue  Metformin 500 mg, 1 tablet daily  - Continue Novolog Mix 40 units with First and 44 units with Supper  -Increase Jardiance 25 mg, 1 tablet daily with breakfast      EDUCATION / INSTRUCTIONS: BG monitoring instructions: Patient is instructed to check her blood sugars 4 times a day, fasting and bedtime. Call  Endocrinology clinic if: BG persistently < 70 . I reviewed the Rule of 15 for the treatment of hypoglycemia in detail with the patient. Literature supplied.    2) Diabetic complications:  Eye: Does not have known diabetic retinopathy.  Neuro/ Feet: Does have known diabetic peripheral neuropathy. Renal: Patient does not have known baseline CKD. She is on an ACEI/ARB at present.    3) Dyslipidemia :  -Her LDL has increased from 94 to 143 mg/DL, I question compliance at this time as this is a huge jump, will monitor    Medication  Atorvastatin 40 mg daily   F/U in 6 months   Signed electronically by: Lyndle Herrlich, MD  [facility] Endocr[facility]  [facility] [address] [place] [place], [place] [address] Phone: [phone] FAX: [phone]   CC: Deeann Saint, MD [address] [place] [address] Phone: [phone]  Fax: [phone]  Return to Endocrinology clinic as below: No future appointments.

## 2021-08-17 ENCOUNTER — Other Ambulatory Visit: Payer: Self-pay | Admitting: Internal Medicine

## 2021-08-17 DIAGNOSIS — E1140 Type 2 diabetes mellitus with diabetic neuropathy, unspecified: Secondary | ICD-10-CM

## 2021-08-25 ENCOUNTER — Encounter: Payer: Self-pay | Admitting: Cardiology

## 2021-08-26 MED ORDER — APIXABAN 5 MG PO TABS: ORAL_TABLET | ORAL | 1 refills | Status: DC

## 2021-10-21 IMAGING — MG MM DIGITAL SCREENING BILAT W/ TOMO AND CAD
8 series · 8 of 24 positions shown · non-contrast
Comparison: None.

CLINICAL DATA: Screening.

EXAM:
DIGITAL SCREENING BILATERAL MAMMOGRAM WITH TOMOSYNTHESIS AND CAD
TECHNIQUE: Bilateral screening digital craniocaudal and mediolateral oblique
mammograms were obtained. Bilateral screening digital breast
tomosynthesis was performed. The images were evaluated with
computer-aided detection.

[R CC synth-2D]
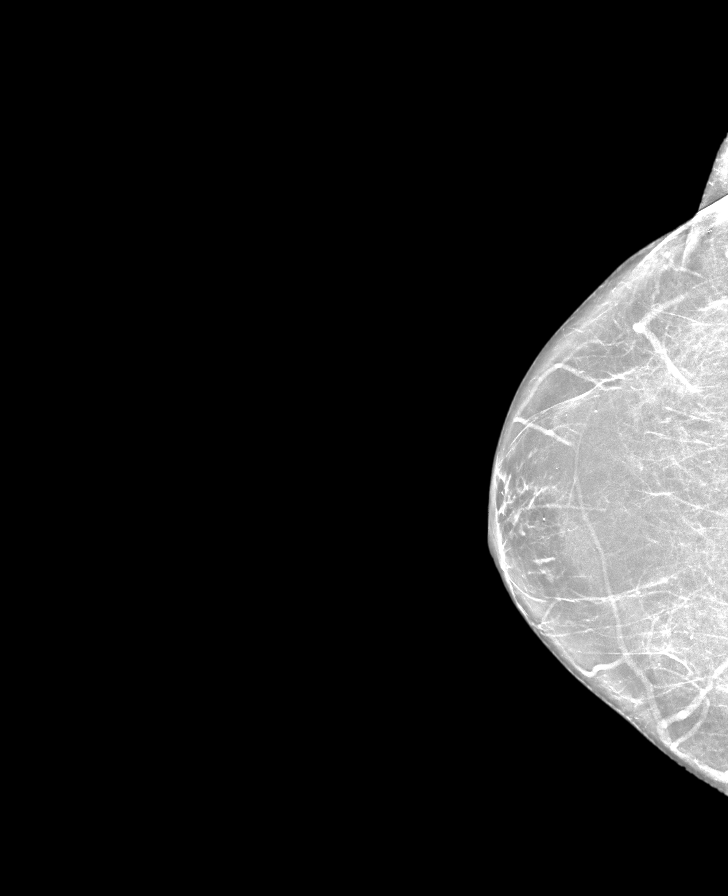

[L CC synth-2D]
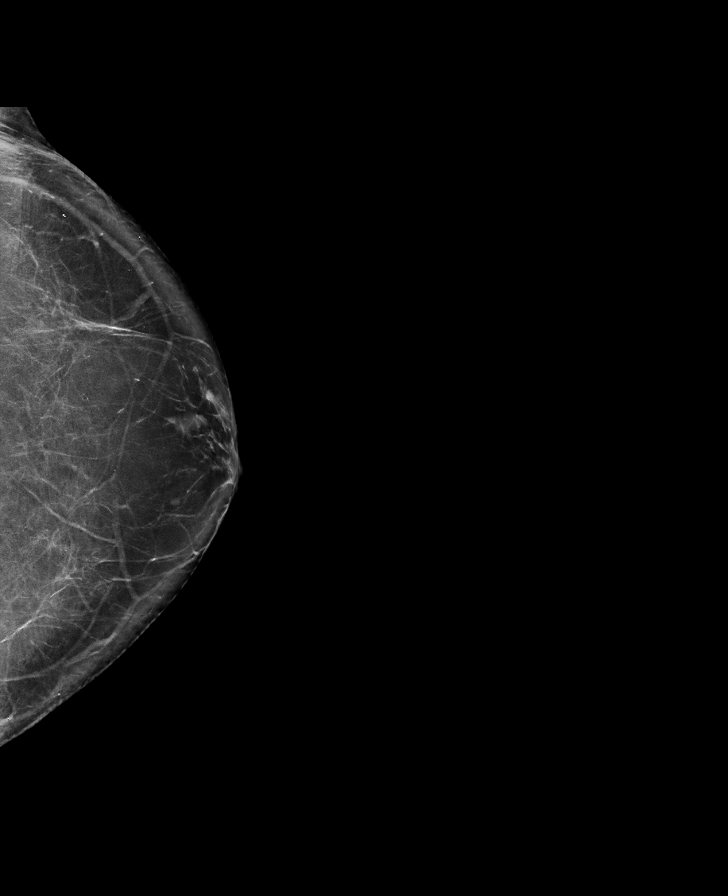

[L MLO synth-2D]
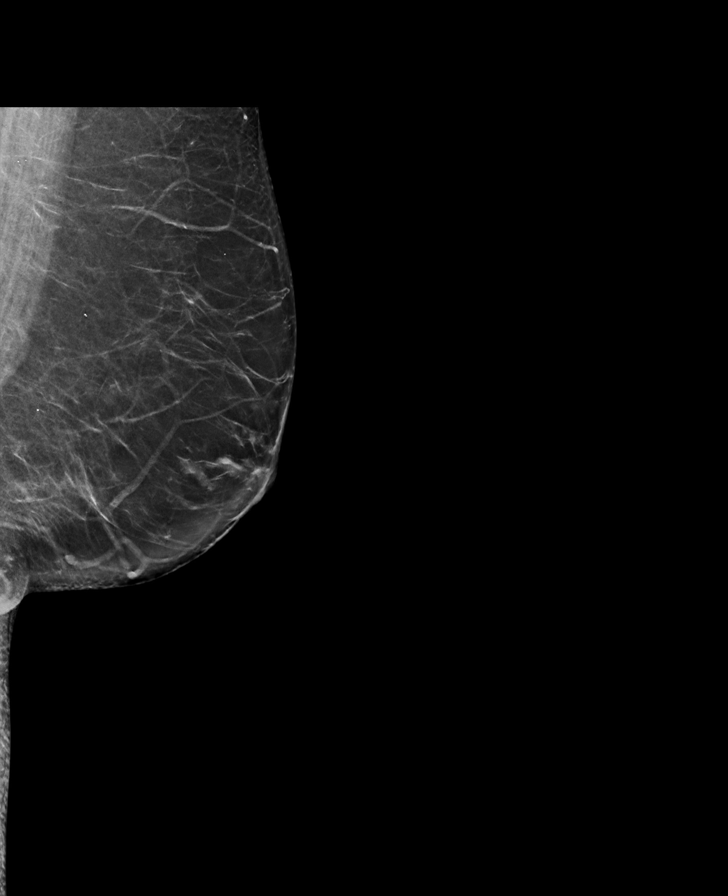

[R MLO synth-2D]
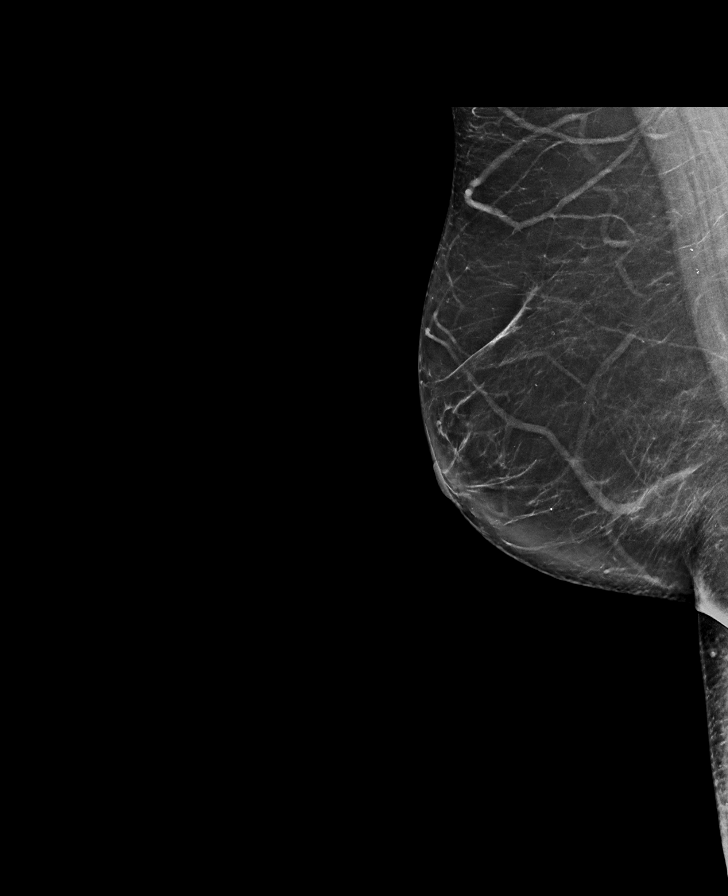

[L CC tomo · tomo slice 38/75.0]
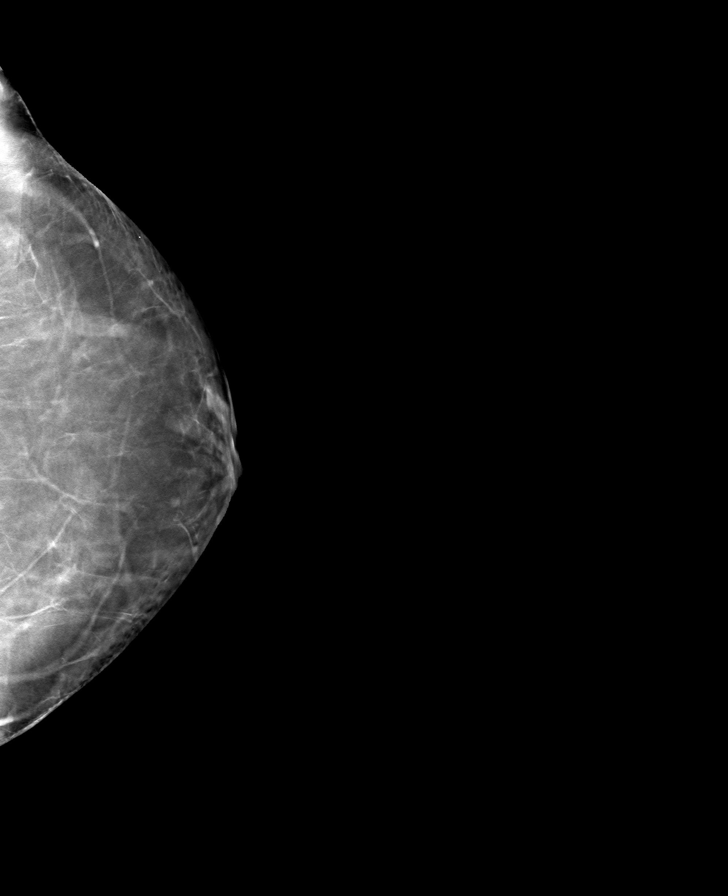

[R MLO tomo · tomo slice 40/79.0]
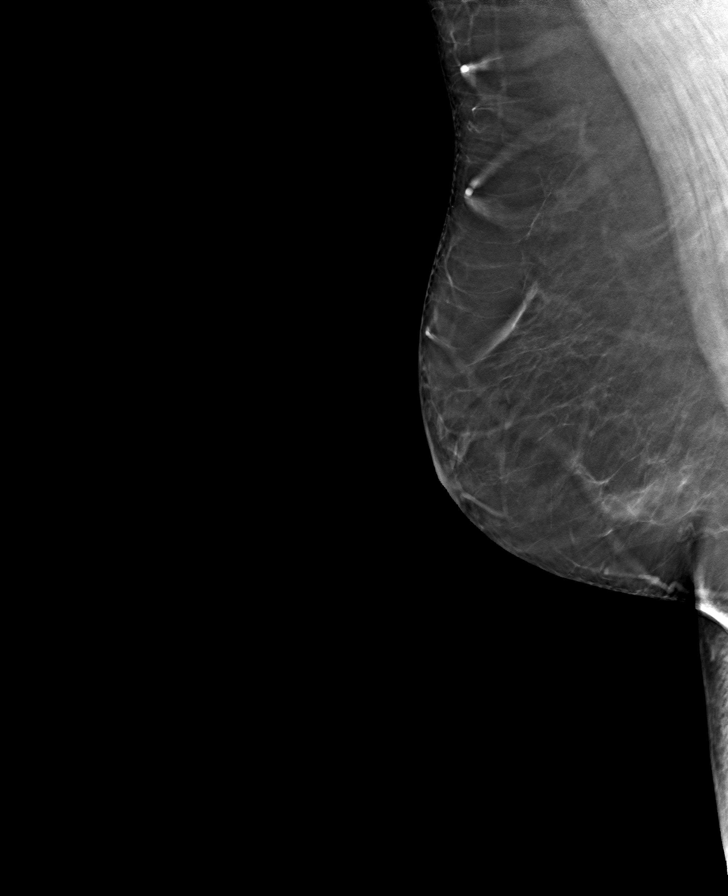

[R CC tomo · tomo slice 35/70.0]
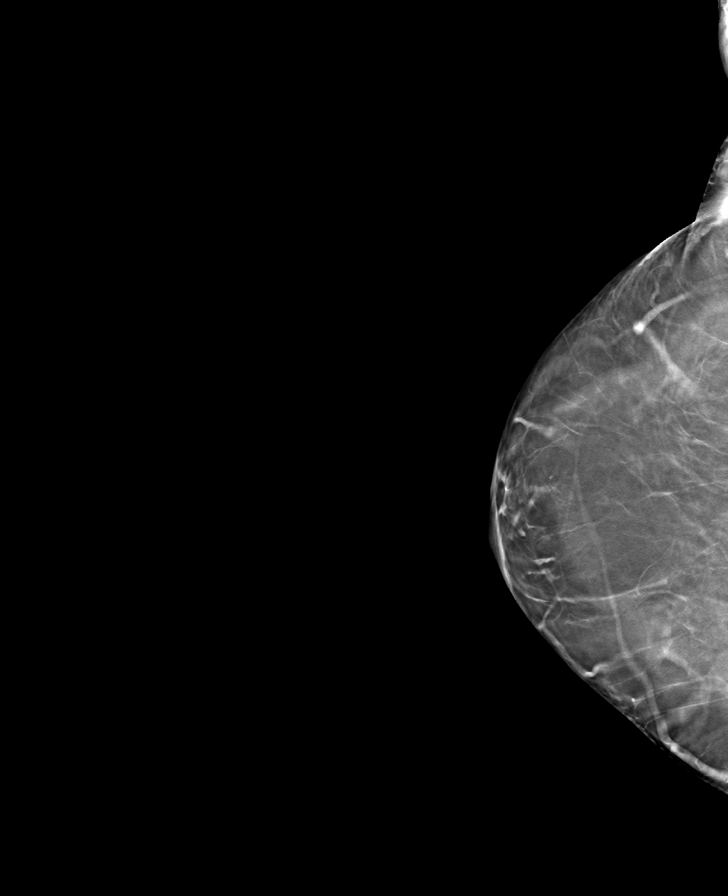

[L MLO tomo · tomo slice 39/77.0]
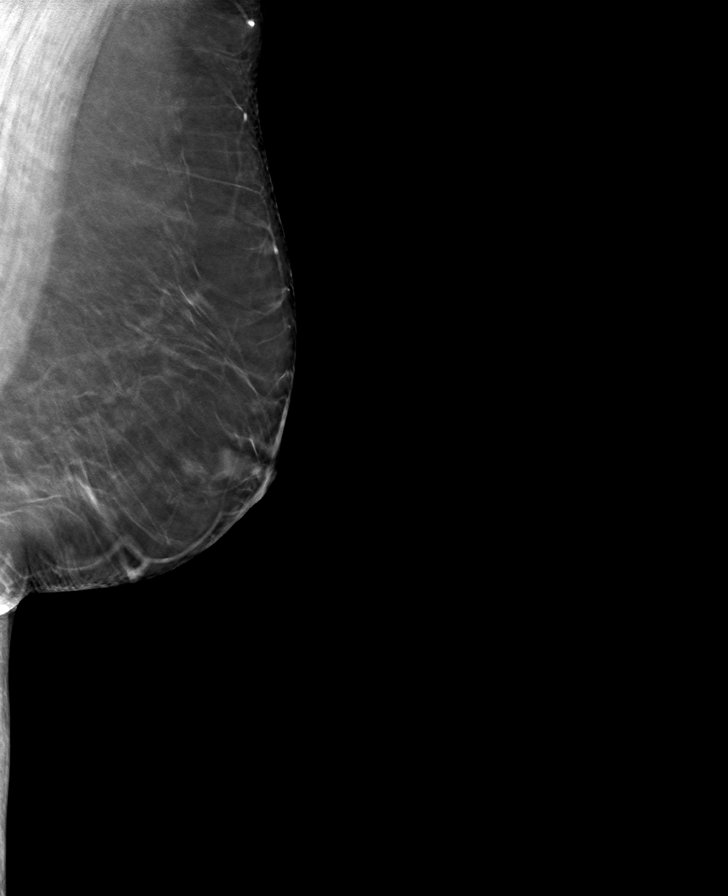

[8 of 24 positions shown; findings below may reference images not displayed]

ACR Breast Density Category b: There are scattered areas of
fibroglandular density.
FINDINGS: There are no findings suspicious for malignancy.
IMPRESSION: No mammographic evidence of malignancy. A result letter of this
screening mammogram will be mailed directly to the patient.

RECOMMENDATION:
Screening mammogram in one year. (Code:XG-X-X7B)

BI-RADS CATEGORY  1: Negative.

## 2021-10-30 ENCOUNTER — Other Ambulatory Visit: Payer: Self-pay | Admitting: Internal Medicine

## 2021-10-30 ENCOUNTER — Other Ambulatory Visit: Payer: Self-pay | Admitting: Cardiology

## 2021-10-30 NOTE — Telephone Encounter (Signed)
Prescription refill request for Julia Dickerson received. ?Indication:Afib ?Last office visit:5/22 ?Scr:0.7 ?Age: 46 ?Weight:115.6 kg ? ?Prescription refilled ? ?

## 2021-11-05 ENCOUNTER — Ambulatory Visit: Payer: 59 | Admitting: Internal Medicine

## 2021-12-01 ENCOUNTER — Encounter: Payer: Self-pay | Admitting: Radiology

## 2021-12-01 ENCOUNTER — Other Ambulatory Visit (HOSPITAL_COMMUNITY)
Admission: RE | Admit: 2021-12-01 | Discharge: 2021-12-01 | Disposition: A | Discharge status: Home / Self Care | Payer: 59 | Source: Ambulatory Visit | Attending: Radiology | Admitting: Radiology

## 2021-12-01 ENCOUNTER — Ambulatory Visit: Payer: 59 | Admitting: Radiology

## 2021-12-01 VITALS — BP 166/86 | Ht 68.0 in | Wt 257.0 lb

## 2021-12-01 DIAGNOSIS — Z01419 Encounter for gynecological examination (general) (routine) without abnormal findings: Secondary | ICD-10-CM | POA: Insufficient documentation

## 2021-12-01 DIAGNOSIS — N762 Acute vulvitis: Secondary | ICD-10-CM

## 2021-12-01 MED ORDER — CEPHALEXIN 500 MG PO CAPS: 500.0000 mg | ORAL_CAPSULE | Freq: Four times a day (QID) | ORAL | 0 refills | Status: DC

## 2021-12-01 MED ORDER — NYSTATIN-TRIAMCINOLONE 100000-0.1 UNIT/GM-% EX OINT: 1.0000 "application " | TOPICAL_OINTMENT | Freq: Two times a day (BID) | CUTANEOUS | 0 refills | Status: DC

## 2021-12-01 NOTE — Progress Notes (Signed)
? ?  Julia Dickerson [birth year] [id number] ? ? ?History: Postmenopausal 46 y.o. presents for annual exam. ? ? ?Gynecologic History ?Postmenopausal ?Last Pap: 2020. Results were: normal ?Last mammogram: 05/04/21. Results were: normal ?Last colonoscopy: never ?HRT use: none ? ?Obstetric History ?OB History  ?Gravida Para Term Preterm AB Living  ?2       2 0  ?SAB IAB Ectopic Multiple Live Births  ?        0  ?  ?# Outcome Date GA Lbr Len/2nd Weight Sex Delivery Anes PTL Lv  ?2 AB           ?1 AB           ? ? ? ?The following portions of the patient's history were reviewed and updated as appropriate: allergies, current medications, past family history, past medical history, past social history, past surgical history, and problem list. ? ?Review of Systems ?Pertinent items noted in HPI and remainder of comprehensive ROS otherwise negative.  ?Past medical history, past surgical history, family history and social history were all reviewed and documented in the EPIC chart. ? ?Exam: ? ?Vitals:  ? 12/01/21 1126  ?BP: (!) 166/86  ?Weight: 257 lb (116.6 kg)  ?Height: 5\' 8"  (1.727 m)  ? ?Body mass index is 39.08 kg/m?. ? ?General appearance:  Normal ?Thyroid:  Symmetrical, normal in size, without palpable masses or nodularity. ?Respiratory ? Auscultation:  Clear without wheezing or rhonchi ?Cardiovascular ? Auscultation:  Regular rate, without rubs, murmurs or gallops ? Edema/varicosities:  Not grossly evident ?Abdominal ? Soft,nontender, without masses, guarding or rebound. ? Liver/spleen:  No organomegaly noted ? Hernia:  None appreciated ? Skin ? Inspection:  Grossly normal ?Breasts: Examined lying and sitting.  ? Right: Without masses, retractions, nipple discharge or axillary adenopathy. ? ? Left: Without masses, retractions, nipple discharge or axillary adenopathy. ?Genitourinary  ? Inguinal/mons:  folliculitis without inguinal adenopathy ? External genitalia:  Inner labia red and erythematous ? BUS/Urethra/Skene's glands:   Normal ? Vagina:  Normal appearing with normal color and discharge, no lesions.   ? Cervix:  Normal appearing without discharge or lesions ? Uterus:  Normal in size, shape and contour.  Midline and mobile, nontender ? Adnexa/parametria:   ?  Rt: Normal in size, without masses or tenderness. ?  Lt: Normal in size, without masses or tenderness. ? Anus and perineum: Normal ?  ? ?Patient informed chaperone available to be present for breast and pelvic exam. Patient has requested no chaperone to be present. Patient has been advised what will be completed during breast and pelvic exam.  ? ?Assessment/Plan:   ?1. Well woman exam with routine gynecological exam ? ?- Cytology - PAP( [facility]) ? ?2. Acute vulvitis ? ?- cephALEXin (KEFLEX) 500 MG capsule; Take 1 capsule (500 mg total) by mouth 4 (four) times daily.  Dispense: 28 capsule; Refill: 0 ?- nystatin-triamcinolone ointment (MYCOLOG); Apply 1 application. topically 2 (two) times daily.  Dispense: 30 g; Refill: 0 ?  ? ?Discussed SBE, colonoscopy and DEXA screening as directed. Recommend of exercise weekly, including weight bearing exercise. Encouraged the use of seatbelts and sunscreen.  ?Return in 1 year for annual or sooner prn. ? ? WHNP-BC, 11:42 AM 12/01/2021  ?

## 2021-12-02 ENCOUNTER — Ambulatory Visit: Payer: 59 | Admitting: Internal Medicine

## 2021-12-02 ENCOUNTER — Encounter: Payer: Self-pay | Admitting: Internal Medicine

## 2021-12-02 VITALS — BP 138/70 | HR 86 | Ht 68.0 in | Wt 256.0 lb

## 2021-12-02 DIAGNOSIS — E785 Hyperlipidemia, unspecified: Secondary | ICD-10-CM | POA: Diagnosis not present

## 2021-12-02 DIAGNOSIS — R739 Hyperglycemia, unspecified: Secondary | ICD-10-CM

## 2021-12-02 DIAGNOSIS — Z794 Long term (current) use of insulin: Secondary | ICD-10-CM | POA: Diagnosis not present

## 2021-12-02 DIAGNOSIS — E1165 Type 2 diabetes mellitus with hyperglycemia: Secondary | ICD-10-CM

## 2021-12-02 DIAGNOSIS — E1140 Type 2 diabetes mellitus with diabetic neuropathy, unspecified: Secondary | ICD-10-CM

## 2021-12-02 LAB — BASIC METABOLIC PANEL
BUN: 16 mg/dL (ref 6–23)
CO2: 26 mEq/L (ref 19–32)
Calcium: 9.8 mg/dL (ref 8.4–10.5)
Chloride: 100 mEq/L (ref 96–112)
Creatinine, Ser: 0.76 mg/dL (ref 0.40–1.20)
GFR: 94.69 mL/min (ref >60.00)
Glucose, Bld: 166 mg/dL — ABNORMAL HIGH (ref 70–99)
Potassium: 4.6 mEq/L (ref 3.5–5.1)
Sodium: 137 mEq/L (ref 135–145)

## 2021-12-02 LAB — LIPID PANEL
Cholesterol: 236 mg/dL — ABNORMAL HIGH (ref 0–200)
HDL: 53.2 mg/dL (ref >39.00)
LDL Cholesterol: 145 mg/dL — ABNORMAL HIGH (ref 0–99)
NonHDL: 182.74
Total CHOL/HDL Ratio: 4
Triglycerides: 188 mg/dL — ABNORMAL HIGH (ref 0.0–149.0)
VLDL: 37.6 mg/dL (ref 0.0–40.0)

## 2021-12-02 LAB — POCT GLYCOSYLATED HEMOGLOBIN (HGB A1C): Hemoglobin A1C: 7.3 % — AB (ref 4.0–5.6)

## 2021-12-02 MED ORDER — INSULIN LISPRO PROT & LISPRO (75-25 MIX) 100 UNIT/ML KWIKPEN: PEN_INJECTOR | SUBCUTANEOUS | 3 refills | Status: DC

## 2021-12-02 MED ORDER — METFORMIN HCL ER 500 MG PO TB24: 500.0000 mg | ORAL_TABLET | Freq: Every day | ORAL | 3 refills | Status: DC

## 2021-12-02 MED ORDER — INSULIN PEN NEEDLE 32G X 4 MM MISC: 1.0000 | Freq: Two times a day (BID) | 3 refills | Status: DC

## 2021-12-02 NOTE — Progress Notes (Signed)
? ?Name: Julia Dickerson  ?Age/ Sex: 46 y.o., female   ?MRN/ DOB: [id number], [birth year]    ? ?PCP: Deeann Saint, MD   ?Reason for Endocrinology Evaluation: Type 2 diabetes Mellitus  ?Initial Endocrine Consultative Visit:  09/05/2018  ? ? ?PATIENT IDENTIFIER: [name] is a 46 y.o. female with a past medical history of PCOS, T2DM, PAF and HTN.  The patient has followed with Endocrinology clinic since 09/05/2018 for consultative assistance with management of her diabetes. ? ?DIABETIC HISTORY:  ?[name] was diagnosed with T2DM since the 2000's.  She was initially on metformin, she was switched to MDI insulin regimen which helped control her hyperglycemia at the time, the patient was subsequently switched to oral glycemic agents in 2012 due to improved glucose control.  She was restarted on basal insulin in 2019 due to persistent hyperglycemia, she has tried Victoza in the past with severe nausea. Her hemoglobin A1c has ranged from 8.7% in in 2019, peaking at 10.7% in 08/14/2018. ? ? ?Prandial insulin started in February 2020, in addition to continuing her metformin and basal insulin. ?Rybelsus is cost prohibitive  ? ?By 05/2019 we switched her MDI regimen to insulin mix, we started Trulicity, and continued metformin. ? ?Trulicity stopped 1/2/021 due to GI side effects  ?Jardiance started 07/2020 ? ?SUBJECTIVE:  ? ?During the last visit (05/07/2021): A1c 7.7 %.  We continued  metformin , adjusted novolog mix and increased  Jardiance  ? ? ? ? ? ?Today (12/02/2021): [name] is here for a follow up on diabetes management. She checks glucose multiple x a day, she has noted  hypoglycemic episodes since her last clinic visit, usually in the afternoon.  ? ?Denies nausea, vomiting or diarrhea  ?She has been reducing CHO intake  ? ? ? ?CONTINUOUS GLUCOSE MONITORING RECORD INTERPRETATION   ? ?Dates of Recording: 4/27-5/04/2022 ? ?Sensor description:dexcom  ? ?Results statistics: ?  ?CGM use % of time 93%   ?Average and SD 190/65  ?Time in range 49   %  ?% Time Above 180 33  ?% Time above 250 17  ?% Time Below target <1  ? ? ? ?Glycemic patterns summary: Hyperglycemia has been noted through the day , BG's at night are at the upper limit of normal  ? ?Hyperglycemic episodes starts with postprandial  ?Hypoglycemic episodes occurred N/A ? ?Overnight periods: trends down and stable  ? ? ? ?HOME DIABETES REGIMEN:  ?NovoLog mix 40  units with Breakfast and 44 units with Supper  ?Metformin 500 mg XR 1 tab daily  ?Jardiance 25 mg daily  ? ? ? ?DIABETIC COMPLICATIONS: ?Microvascular complications:  ?Neuropathy  ?Denies: CKD, retinopathy  ?Last eye exam: Completed 11/2021 ?  ?Macrovascular complications:  ?  ?Denies: CAD, PVD, CVA ? ? ? ? ?HISTORY:  ?Past Medical History:  ?Past Medical History:  ?Diagnosis Date  ? Anxiety   ? Arthritis   ? Atrial fibrillation (HCC)   ? a. paroxysmal, uses PRN Flecainide  ? Depression   ? Diabetes mellitus without complication (HCC)   ? Dysrhythmia   ? GERD (gastroesophageal reflux disease)   ? Hypertension   ? Obesity   ? PCOS (polycystic ovarian syndrome)   ? UTI (lower urinary tract infection)   ? ?Past Surgical History:  ?Past Surgical History:  ?Procedure Laterality Date  ? DILATION AND CURETTAGE OF UTERUS  2011 and 2012  ? x2  ? GXT - Graded Exercise Tolerance Test  07/22/2016  ? Exercise  time 6 minutes - 7 METs. Maximum heart rate 146 BPM. - No EKG changes. No arrhythmias or chest pain.: Low risk test.  ? TRANSTHORACIC ECHOCARDIOGRAM  06/2016  ? EF 6570% with mild LVH. Normal wall motion and diastolic function. Mild RAE. Otherwise normal.  ? WISDOM TOOTH EXTRACTION    ? ?Social History:  reports that she quit smoking about 3 years ago. Her smoking use included cigarettes and e-cigarettes. She has a 10.50 pack-year smoking history. She has never used smokeless tobacco. She reports that she does not currently use alcohol. She reports that she does not use drugs. ?Family History:  ?Family  History  ?Problem Relation Age of Onset  ? Arthritis Mother   ? Diabetes Mother   ? Kidney disease Mother   ? Heart failure Father   ? Heart attack Father   ? Cancer Father   ? Diabetes Father   ? Hearing loss Father   ? Hyperlipidemia Father   ? Hypertension Father   ? Arthritis Sister   ? COPD Maternal Grandmother   ? Heart disease Maternal Grandmother   ? Hyperlipidemia Maternal Grandmother   ? Hyperlipidemia Maternal Grandfather   ? Hypertension Maternal Grandfather   ? Hearing loss Maternal Grandfather   ? Alcohol abuse Maternal Grandfather   ? Arthritis Maternal Grandfather   ? Stroke Maternal Grandfather   ? Hyperlipidemia Paternal Grandmother   ? Miscarriages / Stillbirths Paternal Grandmother   ? Kidney disease Paternal Grandfather   ? Hypertension Paternal Grandfather   ? Heart attack Paternal Grandfather   ? Diabetes Paternal Grandfather   ? Breast cancer Neg Hx   ? ? ? ?HOME MEDICATIONS: ?Allergies as of 12/02/2021   ? ?   Reactions  ? Sulfa Antibiotics Hives  ? ?  ? ?  ?Medication List  ?  ? ?  ? Accurate as of Dec 02, 2021  9:13 AM. If you have any questions, ask your nurse or doctor.  ?  ?  ? ?  ? ?STOP taking these medications   ? ?tiZANidine 4 MG tablet ?Commonly known as: Zanaflex ?Stopped by: Scarlette ShortsIbtehal J Luchiano Viscomi, MD ?  ? ?  ? ?TAKE these medications   ? ?atorvastatin 40 MG tablet ?Commonly known as: LIPITOR ?TAKE 1 TABLET BY MOUTH  DAILY ?  ?cephALEXin 500 MG capsule ?Commonly known as: Keflex ?Take 1 capsule (500 mg total) by mouth 4 (four) times daily. ?  ?cholecalciferol 25 MCG (1000 UNIT) tablet ?Commonly known as: VITAMIN D3 ?Take 1,000 Units by mouth daily. ?  ?Dexcom G6 Receiver Hardie PulleyDevi ?Use as instructed to check blood sugar daily. ?  ?Dexcom G6 Sensor Misc ?USE 1 SENSOR AS DIRECTED ?  ?Dexcom G6 Transmitter Misc ?USE 1 DEVICE AS DIRECTED ?  ?diltiazem 240 MG 24 hr capsule ?Commonly known as: CARDIZEM CD ?Take 1 capsule (240 mg total) by mouth daily. ?  ?diltiazem 60 MG tablet ?Commonly known  as: CARDIZEM ?May take 60 mg  by mouth every 6 hours as needed up to 3 dose a day for breakthrough along with using Flecainide 100 mg. ?  ?DULoxetine 30 MG capsule ?Commonly known as: CYMBALTA ?Take 30 mg by mouth daily. ?  ?Eliquis 5 MG Tabs tablet ?Generic drug: apixaban ?TAKE 1 TABLET BY MOUTH  TWICE DAILY ?  ?empagliflozin 25 MG Tabs tablet ?Commonly known as: Jardiance ?Take 1 tablet (25 mg total) by mouth daily before breakfast. ?  ?flecainide 100 MG tablet ?Commonly known as: TAMBOCOR ?Take 1 tablet (100 mg  total) by mouth 2 (two) times daily as needed. For breakthrough afib ?  ?fluticasone 50 MCG/ACT nasal spray ?Commonly known as: FLONASE ?PLACE 1 SPRAY INTO BOTH NOSTRILS DAILY. ?  ?Insulin Lispro Prot & Lispro (75-25) 100 UNIT/ML Kwikpen ?Commonly known as: HumaLOG Mix 75/25 KwikPen ?Inject 40 Units into the skin daily with breakfast AND 44 Units daily with supper. ?  ?Insulin Pen Needle 32G X 4 MM Misc ?Use as instructed to inject insulin four times daily ?  ?lisinopril 10 MG tablet ?Commonly known as: ZESTRIL ?TAKE 1 TABLET BY MOUTH  DAILY ?  ?metFORMIN 500 MG 24 hr tablet ?Commonly known as: GLUCOPHAGE-XR ?TAKE 1 TABLET BY MOUTH  DAILY WITH BREAKFAST ?  ?multivitamin with minerals Tabs tablet ?Take 1 tablet by mouth daily. ?  ?nystatin-triamcinolone ointment ?Commonly known as: MYCOLOG ?Apply 1 application. topically 2 (two) times daily. ?  ?omeprazole 20 MG capsule ?Commonly known as: PRILOSEC ?Take 20 mg by mouth every evening. ?  ?terbinafine 250 MG tablet ?Commonly known as: LamISIL ?Take 1 tablet (250 mg total) by mouth daily. ?  ?ZINC PO ?Take 1 tablet by mouth in the morning and at bedtime. ?  ? ?  ? ? ? ?OBJECTIVE:  ? ?Vital Signs: BP 138/70 (BP Location: Left Arm, Patient Position: Sitting, Cuff Size: Large)   Pulse 86   Ht 5\' 8"  (1.727 m)   Wt 256 lb (116.1 kg)   LMP 07/03/2018   SpO2 97%   BMI 38.92 kg/m?   ?Wt Readings from Last 3 Encounters:  ?12/02/21 256 lb (116.1 kg)  ?12/01/21 257  lb (116.6 kg)  ?05/07/21 254 lb 12.8 oz (115.6 kg)  ? ? ? ?Exam: ?General: Pt appears well and is in NAD  ?Lungs: Clear with good BS bilat   ?Heart: RRR   ?Abdomen: Normoactive bowel sounds, soft, nontender, withou

## 2021-12-02 NOTE — Patient Instructions (Addendum)
-   Continue  Metformin 500 mg, 1 tablet daily  ?- Change Humalog  Mix 40 units with Breakfast and 48 units with Supper  ?- Continue Jardiance 25  mg, 1 tablet daily with breakfast  ? ? ? ? ?- HOW TO TREAT LOW BLOOD SUGARS (Blood sugar LESS THAN 70 MG/DL) ?Please follow the RULE OF 15 for the treatment of hypoglycemia treatment (when your (blood sugars are less than 70 mg/dL)  ? ?STEP 1: Take 15 grams of carbohydrates when your blood sugar is low, which includes:  ?3-4 GLUCOSE TABS  OR ?3-4 OZ OF JUICE OR REGULAR SODA OR ?ONE TUBE OF GLUCOSE GEL   ? ?STEP 2: RECHECK blood sugar in 15 MINUTES ?STEP 3: If your blood sugar is still low at the 15 minute recheck --> then, go back to STEP 1 and treat AGAIN with another 15 grams of carbohydrates. ? ?

## 2021-12-04 ENCOUNTER — Encounter: Payer: Self-pay | Admitting: Internal Medicine

## 2021-12-04 MED ORDER — ROSUVASTATIN CALCIUM 40 MG PO TABS: 40.0000 mg | ORAL_TABLET | Freq: Every day | ORAL | 3 refills | Status: DC

## 2021-12-08 LAB — CYTOLOGY - PAP
Adequacy: ABSENT
Comment: NEGATIVE
Comment: NEGATIVE
Comment: NEGATIVE
HPV 16: NEGATIVE
HPV 18 / 45: NEGATIVE
High risk HPV: POSITIVE — AB

## 2021-12-11 ENCOUNTER — Other Ambulatory Visit: Payer: Self-pay | Admitting: *Deleted

## 2021-12-11 DIAGNOSIS — R87612 Low grade squamous intraepithelial lesion on cytologic smear of cervix (LGSIL): Secondary | ICD-10-CM

## 2021-12-11 DIAGNOSIS — R87810 Cervical high risk human papillomavirus (HPV) DNA test positive: Secondary | ICD-10-CM

## 2021-12-14 ENCOUNTER — Ambulatory Visit: Payer: 59 | Admitting: Obstetrics and Gynecology

## 2021-12-14 ENCOUNTER — Encounter: Payer: Self-pay | Admitting: Obstetrics and Gynecology

## 2021-12-14 ENCOUNTER — Other Ambulatory Visit (HOSPITAL_COMMUNITY)
Admission: RE | Admit: 2021-12-14 | Discharge: 2021-12-14 | Disposition: A | Discharge status: Home / Self Care | Payer: 59 | Source: Ambulatory Visit | Attending: Obstetrics and Gynecology | Admitting: Obstetrics and Gynecology

## 2021-12-14 VITALS — BP 140/78 | HR 94 | Ht 68.0 in | Wt 257.0 lb

## 2021-12-14 DIAGNOSIS — R87810 Cervical high risk human papillomavirus (HPV) DNA test positive: Secondary | ICD-10-CM | POA: Diagnosis present

## 2021-12-14 DIAGNOSIS — R87612 Low grade squamous intraepithelial lesion on cytologic smear of cervix (LGSIL): Secondary | ICD-10-CM | POA: Diagnosis present

## 2021-12-14 NOTE — Patient Instructions (Signed)
Colposcopy, Care After  The following information offers guidance on how to care for yourself after your procedure. Your doctor may also give you more specific instructions. If you have problems or questions, contact your doctor. What can I expect after the procedure? If you did not have a sample of your tissue taken out (did not have a biopsy), you may only have some spotting of blood for a few days. You can go back to your normal activities. If you had a sample of your tissue taken out, it is common to have: Soreness and mild pain. These may last for a few days. Mild bleeding or fluid (discharge) coming from your vagina. The fluid will look dark and grainy. You may have this for a few days. The fluid may be caused by a liquid that was used during your procedure. You may need to wear a sanitary pad. Spotting of blood for at least 48 hours after the procedure. Follow these instructions at home: Medicines Take over-the-counter and prescription medicines only as told by your doctor. Ask your doctor what over-the-counter pain medicines and prescription medicines you can start taking again. This is very important if you take blood thinners. Activity For at least 3 days, or for as long as told by your doctor, avoid: Douching. Using tampons. Having sex. Return to your normal activities as told by your doctor. Ask your doctor what activities are safe for you. General instructions Ask your doctor if you may take baths, swim, or use a hot tub. You may take showers. If you use birth control (contraception), keep using it. Keep all follow-up visits. Contact a doctor if: You have a fever or chills. You faint or feel light-headed. Get help right away if: You bleed a lot from your vagina. A lot of bleeding means that the bleeding soaks through a pad in less than 1 hour. You have clumps of blood (blood clots) coming from your vagina. You have signs that could mean you have an infection. This may be  fluid coming from your vagina that is: Different than normal. Yellow. Bad-smelling. You have very bad pain or cramps in your lower belly that do not get better with medicine. Summary If you did not have a sample of your tissue taken out, you may only have some spotting of blood for a few days. You can go back to your normal activities. If you had a sample of your tissue taken out, it is common to have mild pain for a few days and spotting for 48 hours. Avoid douching, using tampons, and having sex for at least 3 days after the procedure or for as long as told. Get help right away if you have a lot of bleeding, very bad pain, or signs of infection. This information is not intended to replace advice given to you by your health care provider. Make sure you discuss any questions you have with your health care provider. Document Revised: 12/07/2020 Document Reviewed: 12/07/2020 Elsevier Patient Education  2023 [employer] Inc[employer]  

## 2021-12-14 NOTE — Progress Notes (Signed)
  Subjective:     Patient ID: Julia Dickerson, female   DOB: 16-Apr-1976, 46 y.o.   MRN: [id number]  HPI Patient here today for colposcopy with pap 12-01-21 LSIL, possible HGSIL:Pos HR HPV.   Patient states history of colposcopy with biopsy many years ago but not treatment to cervix.  **patient on Eliquis 5mg  bid**  Quit smoking  3 years ago.  PAP HISTORY; 12-01-21 LSIL:Pos HR HPV;Neg 16/18/45 11-10-18 Neg:Neg HR HPV 08-30-13 Neg   Review of Systems  All other systems reviewed and are negative. LMP: PMP Contraception:  NA      Objective:       Colposcopy - cervix, vagina. Consent for procedure.  3% acetic acid used in vagina and on cervix. White light and green light filter used.  Colposcopy satisfactory:  Yes   __x___          No    _____ Findings:    Cervix:  Very small transformation zone.  Tiny islands of acetowhite change at 6:00.  Vagina:  No HPV lesions.  Ecchymoses after contact with speculum.  Biopsies:   6:00 and ECC sent to pathology separately.  Silver nitrate and Monsel's placed.  Minimal EBL. No complications.   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 Positive HR HPV.     Plan:     HPV, paps, and colposcopy discussed.  FU biopsies.  Final plan to follow.

## 2021-12-16 LAB — SURGICAL PATHOLOGY

## 2022-01-26 ENCOUNTER — Other Ambulatory Visit: Payer: Self-pay | Admitting: Family Medicine

## 2022-01-26 ENCOUNTER — Other Ambulatory Visit: Payer: Self-pay | Admitting: Internal Medicine

## 2022-01-26 ENCOUNTER — Other Ambulatory Visit: Payer: Self-pay | Admitting: Cardiology

## 2022-01-26 DIAGNOSIS — I480 Paroxysmal atrial fibrillation: Secondary | ICD-10-CM

## 2022-01-26 DIAGNOSIS — I10 Essential (primary) hypertension: Secondary | ICD-10-CM

## 2022-02-08 ENCOUNTER — Encounter: Payer: Self-pay | Admitting: Family Medicine

## 2022-02-08 DIAGNOSIS — J069 Acute upper respiratory infection, unspecified: Secondary | ICD-10-CM

## 2022-02-08 MED ORDER — FLUTICASONE PROPIONATE 50 MCG/ACT NA SUSP: 1.0000 | Freq: Every day | NASAL | 1 refills | Status: DC

## 2022-02-14 ENCOUNTER — Emergency Department (HOSPITAL_COMMUNITY): Payer: 59

## 2022-02-14 ENCOUNTER — Emergency Department (HOSPITAL_COMMUNITY)
Admission: EM | Admit: 2022-02-14 | Discharge: 2022-02-14 | Discharge status: Against Medical Advice | Payer: 59 | Attending: Emergency Medicine | Admitting: Emergency Medicine

## 2022-02-14 ENCOUNTER — Encounter (HOSPITAL_COMMUNITY): Payer: Self-pay

## 2022-02-14 ENCOUNTER — Other Ambulatory Visit: Payer: Self-pay

## 2022-02-14 DIAGNOSIS — M542 Cervicalgia: Secondary | ICD-10-CM | POA: Diagnosis present

## 2022-02-14 DIAGNOSIS — M545 Low back pain, unspecified: Secondary | ICD-10-CM | POA: Diagnosis not present

## 2022-02-14 DIAGNOSIS — Z5321 Procedure and treatment not carried out due to patient leaving prior to being seen by health care provider: Secondary | ICD-10-CM | POA: Insufficient documentation

## 2022-02-14 DIAGNOSIS — G8929 Other chronic pain: Secondary | ICD-10-CM | POA: Diagnosis not present

## 2022-02-14 DIAGNOSIS — Y92410 Unspecified street and highway as the place of occurrence of the external cause: Secondary | ICD-10-CM | POA: Diagnosis not present
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
## 2022-02-14 NOTE — ED Notes (Signed)
Pt states that they saw the results in their mychart and are leaving
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
## 2022-04-15 ENCOUNTER — Other Ambulatory Visit: Payer: Self-pay | Admitting: Family Medicine

## 2022-04-15 ENCOUNTER — Other Ambulatory Visit: Payer: Self-pay | Admitting: Cardiology

## 2022-04-15 DIAGNOSIS — I10 Essential (primary) hypertension: Secondary | ICD-10-CM

## 2022-04-15 DIAGNOSIS — I480 Paroxysmal atrial fibrillation: Secondary | ICD-10-CM

## 2022-04-16 ENCOUNTER — Other Ambulatory Visit: Payer: Self-pay

## 2022-04-16 DIAGNOSIS — I10 Essential (primary) hypertension: Secondary | ICD-10-CM

## 2022-04-16 MED ORDER — LISINOPRIL 10 MG PO TABS: 10.0000 mg | ORAL_TABLET | Freq: Every day | ORAL | 1 refills | Status: DC

## 2022-05-13 ENCOUNTER — Encounter (HOSPITAL_COMMUNITY): Payer: Self-pay

## 2022-05-13 ENCOUNTER — Ambulatory Visit (HOSPITAL_COMMUNITY)
Admission: EM | Admit: 2022-05-13 | Discharge: 2022-05-13 | Disposition: A | Discharge status: Home / Self Care | Payer: 59 | Attending: Family Medicine | Admitting: Family Medicine

## 2022-05-13 DIAGNOSIS — R519 Headache, unspecified: Secondary | ICD-10-CM

## 2022-05-13 MED ORDER — KETOROLAC TROMETHAMINE 30 MG/ML IJ SOLN
INTRAMUSCULAR | Status: AC
  Filled 2022-05-13: qty 1

## 2022-05-13 MED ORDER — METOCLOPRAMIDE HCL 5 MG/ML IJ SOLN
5.0000 mg | Freq: Once | INTRAMUSCULAR | Status: AC
  Administered 2022-05-13: 5 mg via INTRAMUSCULAR

## 2022-05-13 MED ORDER — KETOROLAC TROMETHAMINE 30 MG/ML IJ SOLN
30.0000 mg | Freq: Once | INTRAMUSCULAR | Status: AC
  Administered 2022-05-13: 30 mg via INTRAMUSCULAR

## 2022-05-13 MED ORDER — SUMATRIPTAN SUCCINATE 6 MG/0.5ML ~~LOC~~ SOLN
SUBCUTANEOUS | Status: AC
  Filled 2022-05-13: qty 0.5

## 2022-05-13 MED ORDER — SUMATRIPTAN SUCCINATE 6 MG/0.5ML ~~LOC~~ SOLN
6.0000 mg | Freq: Once | SUBCUTANEOUS | Status: AC
  Administered 2022-05-13: 6 mg via SUBCUTANEOUS

## 2022-05-13 MED ORDER — METOCLOPRAMIDE HCL 5 MG/ML IJ SOLN
INTRAMUSCULAR | Status: AC
  Filled 2022-05-13: qty 2

## 2022-05-13 NOTE — ED Triage Notes (Signed)
Pt reports 4-day frontal headache. Pt reports taken Excedrin with no relief.

## 2022-05-13 NOTE — Discharge Instructions (Addendum)
You have been given a shot of Toradol 30 mg, Reglan, and sumatriptan.  Please proceed to the emergency room if you do not improve with this injection.

## 2022-05-13 NOTE — ED Notes (Signed)
Patient was discharged by this nurse 

## 2022-05-13 NOTE — ED Provider Notes (Signed)
[place]    CSN: [id number] Arrival date & time: 05/13/22  1855      History   Chief Complaint Chief Complaint  Patient presents with   Headache    HPI Julia Dickerson is a 46 y.o. female.    Headache  Here for frontal headache that is been bothering her since October 16.  No associated nausea or photophobia.  It is throbbing at times, and will worsen in the more intense when she stands up.  On the 16th she did have some lightheadedness and felt off, but that is resolved.   No fall or trauma  Past medical history is significant for diabetes and atrial fibrillation  No cough or congestion.  No fever or chills.  Past Medical History:  Diagnosis Date   Anxiety    Arthritis    Atrial fibrillation (HCC)    a. paroxysmal, uses PRN Flecainide   Depression    Diabetes mellitus without complication ([place])    Dysrhythmia    GERD (gastroesophageal reflux disease)    Hypertension    Obesity    PCOS (polycystic ovarian syndrome)    UTI (lower urinary tract infection)     Patient Active Problem List   Diagnosis Date Noted   Secondary hypercoagulable state ([place]) 07/31/2020   A-fib ([place]) 07/28/2020   Type 2 diabetes mellitus with hyperglycemia, with long-term current use of insulin ([place]) 02/11/2020   Hypertriglyceridemia 06/15/2019   OSA (obstructive sleep apnea) 10/05/2018   Weight loss 10/05/2018   Former smoker 08/07/2018   PCOS (polycystic ovarian syndrome) 08/03/2018   Chronic diarrhea 09/19/2017   Neuropathic pain 09/19/2017   Type 2 diabetes mellitus with diabetic neuropathy, unspecified ([place]) 08/19/2017   Hyperlipidemia associated with type 2 diabetes mellitus ([place]) 08/19/2017   Current use of long term anticoagulation 05/31/2017   Atrial fibrillation with RVR ([place])    Abnormal EKG 06/24/2016   Paroxysmal atrial fibrillation ([place]): CHA2DS2Vasc ~3 06/23/2016   Essential hypertension 06/23/2016   Major depression, recurrent ([place]) 07/10/2014     Past Surgical History:  Procedure Laterality Date   DILATION AND CURETTAGE OF UTERUS  2011 and 2012   x2   GXT - Graded Exercise Tolerance Test  07/22/2016   Exercise time 6 minutes - 7 METs. Maximum heart rate 146 BPM. - No EKG changes. No arrhythmias or chest pain.: Low risk test.   TRANSTHORACIC ECHOCARDIOGRAM  06/2016   EF 6570% with mild LVH. Normal wall motion and diastolic function. Mild RAE. Otherwise normal.   WISDOM TOOTH EXTRACTION      OB History     Gravida  2   Para      Term      Preterm      AB  2   Living  0      SAB      IAB      Ectopic      Multiple      Live Births  0            Home Medications    Prior to Admission medications   Medication Sig Start Date End Date Taking? Authorizing Provider  cholecalciferol (VITAMIN D3) 25 MCG (1000 UNIT) tablet Take 1,000 Units by mouth daily.    [provider]  Continuous Blood Gluc Receiver (DEXCOM G6 RECEIVER) DEVI Use as instructed to check blood sugar daily. 08/22/20   Shamleffer, Melanie Crazier, MD  Continuous Blood Gluc Sensor (DEXCOM G6 SENSOR) MISC USE 1 SENSOR AS  DIRECTED 08/18/21   Shamleffer, Melanie Crazier, MD  Continuous Blood Gluc Transmit (DEXCOM G6 TRANSMITTER) MISC USE 1 DEVICE AS DIRECTED 11/01/21   Shamleffer, Melanie Crazier, MD  diltiazem (CARDIZEM CD) 240 MG 24 hr capsule TAKE 1 CAPSULE BY MOUTH DAILY 04/16/22   Leonie Man, MD  diltiazem (CARDIZEM) 60 MG tablet May take 60 mg  by mouth every 6 hours as needed up to 3 dose a day for breakthrough along with using Flecainide 100 mg. 04/08/21   Leonie Man, MD  DULoxetine (CYMBALTA) 30 MG capsule Take 30 mg by mouth daily.    [provider]  ELIQUIS 5 MG TABS tablet TAKE 1 TABLET BY MOUTH  TWICE DAILY 10/30/21   Leonie Man, MD  flecainide (TAMBOCOR) 100 MG tablet Take 1 tablet (100 mg total) by mouth 2 (two) times daily as needed. For breakthrough afib 07/28/20 08/27/20  Sherol Dade E, PA-C   fluticasone (FLONASE) 50 MCG/ACT nasal spray Place 1 spray into both nostrils daily. 02/08/22   Billie Ruddy, MD  Insulin Lispro Prot & Lispro (HUMALOG MIX 75/25 KWIKPEN) (75-25) 100 UNIT/ML Kwikpen Inject 40 Units into the skin daily with breakfast AND 48 Units daily with supper. 12/02/21   Shamleffer, Melanie Crazier, MD  Insulin Pen Needle 32G X 4 MM MISC Inject 1 Device into the skin in the morning and at bedtime. Use as instructed to inject insulin four times daily 12/02/21   Shamleffer, Melanie Crazier, MD  JARDIANCE 25 MG TABS tablet TAKE 1 TABLET BY MOUTH  DAILY BEFORE BREAKFAST 01/27/22   Shamleffer, Melanie Crazier, MD  lisinopril (ZESTRIL) 10 MG tablet Take 1 tablet (10 mg total) by mouth daily. 04/16/22   Billie Ruddy, MD  metFORMIN (GLUCOPHAGE-XR) 500 MG 24 hr tablet Take 1 tablet (500 mg total) by mouth daily with breakfast. 12/02/21   Shamleffer, Melanie Crazier, MD  Multiple Vitamin (MULTIVITAMIN WITH MINERALS) TABS tablet Take 1 tablet by mouth daily.    [provider]  Multiple Vitamins-Minerals (ZINC PO) Take 1 tablet by mouth in the morning and at bedtime.    [provider]  omeprazole (PRILOSEC) 20 MG capsule Take 20 mg by mouth every evening. 02/09/10   [provider]  rosuvastatin (CRESTOR) 40 MG tablet Take 1 tablet (40 mg total) by mouth daily. 12/04/21   Shamleffer, Melanie Crazier, MD    Family History Family History  Problem Relation Age of Onset   Arthritis Mother    Diabetes Mother    Kidney disease Mother    Heart failure Father    Heart attack Father    Cancer Father    Diabetes Father    Hearing loss Father    Hyperlipidemia Father    Hypertension Father    Arthritis Sister    COPD Maternal Grandmother    Heart disease Maternal Grandmother    Hyperlipidemia Maternal Grandmother    Hyperlipidemia Maternal Grandfather    Hypertension Maternal Grandfather    Hearing loss Maternal Grandfather    Alcohol abuse Maternal  Grandfather    Arthritis Maternal Grandfather    Stroke Maternal Grandfather    Hyperlipidemia Paternal Grandmother    Miscarriages / Stillbirths Paternal Grandmother    Kidney disease Paternal Grandfather    Hypertension Paternal Grandfather    Heart attack Paternal Grandfather    Diabetes Paternal Grandfather    Breast cancer Neg Hx     Social History Social History   Tobacco Use   Smoking status: Former  Packs/day: 0.50    Years: 21.00    Total pack years: 10.50    Types: Cigarettes, E-cigarettes    Quit date: 08/24/2018    Years since quitting: 3.7   Smokeless tobacco: Never   Tobacco comments:    Using nicotine patches  Vaping Use   Vaping Use: Never used  Substance Use Topics   Alcohol use: Not Currently   Drug use: No     Allergies   Sulfa antibiotics   Review of Systems Review of Systems  Neurological:  Positive for headaches.     Physical Exam Triage Vital Signs ED Triage Vitals [05/13/22 1928]  Enc Vitals Group     BP (!) 161/95     Pulse Rate 83     Resp 16     Temp 98.5 F (36.9 C)     Temp Source Oral     SpO2 100 %     Weight      Height      Head Circumference      Peak Flow      Pain Score      Pain Loc      Pain Edu?      Excl. in [place]?    No data found.  Updated Vital Signs BP (!) 161/95 (BP Location: Left Arm)   Pulse 83   Temp 98.5 F (36.9 C) (Oral)   Resp 16   LMP 11/23/2017   SpO2 100%   Visual Acuity Right Eye Distance:   Left Eye Distance:   Bilateral Distance:    Right Eye Near:   Left Eye Near:    Bilateral Near:     Physical Exam Vitals reviewed.  Constitutional:      General: She is not in acute distress.    Appearance: She is not toxic-appearing.  HENT:     Right Ear: Tympanic membrane and ear canal normal.     Left Ear: Tympanic membrane and ear canal normal.     Nose: Nose normal.     Mouth/Throat:     Mouth: Mucous membranes are moist.     Pharynx: No oropharyngeal exudate or posterior  oropharyngeal erythema.  Eyes:     Extraocular Movements: Extraocular movements intact.     Conjunctiva/sclera: Conjunctivae normal.     Pupils: Pupils are equal, round, and reactive to light.     Funduscopic exam:    Right eye: No papilledema.        Left eye: No papilledema.  Cardiovascular:     Rate and Rhythm: Normal rate and regular rhythm.     Heart sounds: No murmur heard. Pulmonary:     Effort: Pulmonary effort is normal. No respiratory distress.     Breath sounds: No wheezing, rhonchi or rales.  Chest:     Chest wall: No tenderness.  Musculoskeletal:     Cervical back: Neck supple.     Right lower leg: No edema.     Left lower leg: No edema.  Lymphadenopathy:     Cervical: No cervical adenopathy.  Skin:    Capillary Refill: Capillary refill takes less than 2 seconds.     Coloration: Skin is not jaundiced or pale.  Neurological:     Mental Status: She is alert and oriented to person, place, and time.     Cranial Nerves: No cranial nerve deficit.     Motor: No weakness.     Coordination: Coordination normal.     Gait: Gait normal.  Deep Tendon Reflexes: Reflexes normal.     Comments: She has reduced light touch sensation in her feet. She states this is due to spinal degenerative problems  Psychiatric:        Behavior: Behavior normal.      UC Treatments / Results  Labs (all labs ordered are listed, but only abnormal results are displayed) Labs Reviewed - No data to display  EKG   Radiology No results found.  Procedures Procedures (including critical care time)  Medications Ordered in UC Medications  ketorolac (TORADOL) 30 MG/ML injection 30 mg (has no administration in time range)  metoCLOPramide (REGLAN) injection 5 mg (has no administration in time range)  SUMAtriptan (IMITREX) injection 6 mg (has no administration in time range)    Initial Impression / Assessment and Plan / UC Course  I have reviewed the triage vital signs and the nursing  notes.  Pertinent labs & imaging results that were available during my care of the patient were reviewed by me and considered in my medical decision making (see chart for details).        She is given our headache cocktail minus the steroid.  If she does not improve in the next hour to 2 hours, I have asked her to please proceed to the emergency room for more evaluation Final Clinical Impressions(s) / UC Diagnoses   Final diagnoses:  Acute intractable headache, unspecified headache type     Discharge Instructions      You have been given a shot of Toradol 30 mg, Reglan, and sumatriptan.  Please proceed to the emergency room if you do not improve with this injection.     ED Prescriptions   None    PDMP not reviewed this encounter.   Barrett Henle, MD 05/13/22 2001

## 2022-06-01 ENCOUNTER — Other Ambulatory Visit: Payer: Self-pay | Admitting: Family Medicine

## 2022-06-01 DIAGNOSIS — I10 Essential (primary) hypertension: Secondary | ICD-10-CM

## 2022-06-02 NOTE — Progress Notes (Signed)
Name: Julia Dickerson  Age/ Sex: 46 y.o., female   MRN/ DOB: [id number], [birth year]     PCP: Deeann Saint, MD   Reason for Endocrinology Evaluation: Type 2 diabetes Mellitus  Initial Endocrine Consultative Visit:  09/05/2018    PATIENT IDENTIFIER: Julia Dickerson is a 46 y.o. female with a past medical history of PCOS, T2DM, PAF and HTN.  The patient has followed with Endocrinology clinic since 09/05/2018 for consultative assistance with management of her diabetes.  DIABETIC HISTORY:  Julia Dickerson was diagnosed with T2DM since the 2000's.  She was initially on metformin, she was switched to MDI insulin regimen which helped control her hyperglycemia at the time, the patient was subsequently switched to oral glycemic agents in 2012 due to improved glucose control.  She was restarted on basal insulin in 2019 due to persistent hyperglycemia, she has tried Victoza in the past with severe nausea. Her hemoglobin A1c has ranged from 8.7% in in 2019, peaking at 10.7% in 08/14/2018.   Prandial insulin started in February 2020, in addition to continuing her metformin and basal insulin. Rybelsus is cost prohibitive   By 05/2019 we switched her MDI regimen to insulin mix, we started Trulicity, and continued metformin.  Trulicity stopped 1/2/021 due to GI side effects  Jardiance started 07/2020  SUBJECTIVE:   During the last visit (12/02/2021): A1c 7.3 %.       Today (06/03/2022): Julia Dickerson is here for a follow up on diabetes management. She checks glucose multiple x a day, she has not had any  hypoglycemic episodes since her last clinic visit.  She has been taking insulin mix three times with each meal 48 units with Breakfast , 48 units lunch and 48 with supper   She has acid reflux and takes omeprazole   CONTINUOUS GLUCOSE MONITORING RECORD INTERPRETATION    Dates of Recording: 10/27-11/03/2022  Sensor description:dexcom   Results statistics:   CGM use % of time 93%  Average and  SD 209/47  Time in range 32 %  % Time Above 180 48  % Time above 250 20  % Time Below target <0     Glycemic patterns summary: Hyperglycemia has been noted through the day , BG's at night are at the upper limit of normal   Hyperglycemic episodes postprandial  Hypoglycemic episodes occurred N/A  Overnight periods: stable     HOME DIABETES REGIMEN:  NovoLog mix 40  units with Breakfast and 44 units with Supper  Metformin 500 mg XR 1 tab daily  Jardiance 25 mg daily  Rosuvastatin 40 mg daily    DIABETIC COMPLICATIONS: Microvascular complications:  Neuropathy  Denies: CKD, retinopathy  Last eye exam: Completed 11/2021   Macrovascular complications:    Denies: CAD, PVD, CVA     HISTORY:  Past Medical History:  Past Medical History:  Diagnosis Date   Anxiety    Arthritis    Atrial fibrillation (HCC)    a. paroxysmal, uses PRN Flecainide   Depression    Diabetes mellitus without complication (HCC)    Dysrhythmia    GERD (gastroesophageal reflux disease)    Hypertension    Obesity    PCOS (polycystic ovarian syndrome)    UTI (lower urinary tract infection)    Past Surgical History:  Past Surgical History:  Procedure Laterality Date   DILATION AND CURETTAGE OF UTERUS  2011 and 2012   x2   GXT - Graded Exercise Tolerance Test  07/22/2016   Exercise time  6 minutes - 7 METs. Maximum heart rate 146 BPM. - No EKG changes. No arrhythmias or chest pain.: Low risk test.   TRANSTHORACIC ECHOCARDIOGRAM  06/2016   EF 6570% with mild LVH. Normal wall motion and diastolic function. Mild RAE. Otherwise normal.   WISDOM TOOTH EXTRACTION     Social History:  reports that she quit smoking about 3 years ago. Her smoking use included cigarettes and e-cigarettes. She has a 10.50 pack-year smoking history. She has never used smokeless tobacco. She reports that she does not currently use alcohol. She reports that she does not use drugs. Family History:  Family History  Problem  Relation Age of Onset   Arthritis Mother    Diabetes Mother    Kidney disease Mother    Heart failure Father    Heart attack Father    Cancer Father    Diabetes Father    Hearing loss Father    Hyperlipidemia Father    Hypertension Father    Arthritis Sister    COPD Maternal Grandmother    Heart disease Maternal Grandmother    Hyperlipidemia Maternal Grandmother    Hyperlipidemia Maternal Grandfather    Hypertension Maternal Grandfather    Hearing loss Maternal Grandfather    Alcohol abuse Maternal Grandfather    Arthritis Maternal Grandfather    Stroke Maternal Grandfather    Hyperlipidemia Paternal Grandmother    Miscarriages / Stillbirths Paternal Grandmother    Kidney disease Paternal Grandfather    Hypertension Paternal Grandfather    Heart attack Paternal Grandfather    Diabetes Paternal Grandfather    Breast cancer Neg Hx      HOME MEDICATIONS: Allergies as of 06/03/2022       Reactions   Sulfa Antibiotics Hives        Medication List        Accurate as of June 03, 2022  9:03 AM. If you have any questions, ask your nurse or doctor.          cholecalciferol 25 MCG (1000 UNIT) tablet Commonly known as: VITAMIN D3 Take 1,000 Units by mouth daily.   Dexcom G6 Receiver Devi Use as instructed to check blood sugar daily.   Dexcom G6 Sensor Misc USE 1 SENSOR AS DIRECTED   Dexcom G6 Transmitter Misc USE 1 DEVICE AS DIRECTED   diltiazem 240 MG 24 hr capsule Commonly known as: CARDIZEM CD TAKE 1 CAPSULE BY MOUTH DAILY   diltiazem 60 MG tablet Commonly known as: CARDIZEM May take 60 mg  by mouth every 6 hours as needed up to 3 dose a day for breakthrough along with using Flecainide 100 mg.   DULoxetine 30 MG capsule Commonly known as: CYMBALTA Take 30 mg by mouth daily.   Eliquis 5 MG Tabs tablet Generic drug: apixaban TAKE 1 TABLET BY MOUTH  TWICE DAILY   flecainide 100 MG tablet Commonly known as: TAMBOCOR Take 1 tablet (100 mg total) by  mouth 2 (two) times daily as needed. For breakthrough afib   fluticasone 50 MCG/ACT nasal spray Commonly known as: FLONASE Place 1 spray into both nostrils daily.   Insulin Lispro Prot & Lispro (75-25) 100 UNIT/ML Kwikpen Commonly known as: HumaLOG Mix 75/25 KwikPen Inject 40 Units into the skin daily with breakfast AND 48 Units daily with supper.   Insulin Pen Needle 32G X 4 MM Misc Inject 1 Device into the skin in the morning and at bedtime. Use as instructed to inject insulin four times daily   Jardiance 25  MG Tabs tablet Generic drug: empagliflozin TAKE 1 TABLET BY MOUTH  DAILY BEFORE BREAKFAST   lisinopril 10 MG tablet Commonly known as: ZESTRIL Take 1 tablet (10 mg total) by mouth daily.   metFORMIN 500 MG 24 hr tablet Commonly known as: GLUCOPHAGE-XR Take 1 tablet (500 mg total) by mouth daily with breakfast.   multivitamin with minerals Tabs tablet Take 1 tablet by mouth daily.   omeprazole 20 MG capsule Commonly known as: PRILOSEC Take 20 mg by mouth every evening.   rosuvastatin 40 MG tablet Commonly known as: CRESTOR Take 1 tablet (40 mg total) by mouth daily.   tirzepatide 2.5 MG/0.5ML Pen Commonly known as: MOUNJARO Inject 2.5 mg into the skin once a week. Started by: Scarlette Shorts, MD   ZINC PO Take 1 tablet by mouth in the morning and at bedtime.         OBJECTIVE:   Vital Signs: BP 130/82 (BP Location: Left Arm, Patient Position: Sitting, Cuff Size: Large)   Pulse 91   Ht 5\' 8"  (1.727 m)   Wt 269 lb (122 kg)   LMP 11/23/2017   SpO2 93%   BMI 40.90 kg/m   Wt Readings from Last 3 Encounters:  06/03/22 269 lb (122 kg)  12/14/21 257 lb (116.6 kg)  12/02/21 256 lb (116.1 kg)     Exam: General: Pt appears well and is in NAD  Lungs: Clear with good BS bilat   Heart: RRR   Abdomen: Normoactive bowel sounds, soft, nontender, without masses or organomegaly palpable  Extremities: No pretibial edema.   Neuro: MS is good with  appropriate affect, pt is alert and Ox3        DM foot exam: 06/03/2022 The skin of the feet is intact without sores or ulcerations. The pedal pulses are 2+ on right and 2+ on left. The sensation is absent  to a screening 5.07, 10 gram monofilament bilaterally       DATA REVIEWED:  Lab Results  Component Value Date   HGBA1C 6.8 (A) 06/03/2022   HGBA1C 7.3 (A) 12/02/2021   HGBA1C 7.7 (A) 05/07/2021    Latest Reference Range & Units 06/03/22 09:14  Sodium 135 - 145 mEq/L 139  Potassium 3.5 - 5.1 mEq/L 4.3  Chloride 96 - 112 mEq/L 102  CO2 19 - 32 mEq/L 28  Glucose 70 - 99 mg/dL 13/09/23 (H)  BUN 6 - 23 mg/dL 14  Creatinine 601 - 0.93 mg/dL 2.35  Calcium 8.4 - 5.73 mg/dL 9.4  Alkaline Phosphatase 39 - 117 U/L 61  Albumin 3.5 - 5.2 g/dL 4.6  AST 0 - 37 U/L 25  ALT 0 - 35 U/L 31  Total Protein 6.0 - 8.3 g/dL 7.8  Total Bilirubin 0.2 - 1.2 mg/dL 0.6  GFR 22.0 mL/min 105.25    Latest Reference Range & Units 06/03/22 09:14  Total CHOL/HDL Ratio  3  Cholesterol 0 - 200 mg/dL 13/09/23  HDL Cholesterol 706 mg/dL >23.76  LDL (calc) 0 - 99 mg/dL 86  MICROALB/CREAT RATIO 0.0 - 30.0 mg/g 7.8  NonHDL  115.04  Triglycerides 0.0 - 149.0 mg/dL 28.31  VLDL 0.0 - 517.6 mg/dL 16.0    Latest Reference Range & Units 06/03/22 09:14  Creatinine,U mg/dL 13/09/23  Microalb, Ur 0.0 - 1.9 mg/dL 2.8 (H)  MICROALB/CREAT RATIO 0.0 - 30.0 mg/g 7.8     ASSESSMENT / PLAN / RECOMMENDATIONS:   1) Type 2 Diabetes Mellitus,optimally controlled , With Neuropathic Complications - Most recent A1c  of 6.8 %. Goal A1c < 7.0 %.    -Praised the patient on improved glycemic control -She is intolerant to Trulicity and higher doses of metformin, today we entertained the idea or trying Mounjaro with the understanding that she could also have GI side effects, but will try  -I have discouraged the patient from taking Humalog Mix 3 times a day due to the high risk of hypoglycemia due to insulin stacking but will adjust  the dose as below  MEDICATIONS: - Continue  Metformin 500 mg, 1 tablet daily  - Change Humalog Mix 60 units with Breakfast  and 50 units with Supper  - Continue Jardiance 25 mg, 1 tablet daily with breakfast  -Start Mounjaro 2.5 mg once weekly    EDUCATION / INSTRUCTIONS: BG monitoring instructions: Patient is instructed to check her blood sugars 4 times a day, fasting and bedtime. Call  Endocrinology clinic if: BG persistently < 70 . I reviewed the Rule of 15 for the treatment of hypoglycemia in detail with the patient. Literature supplied.    2) Diabetic complications:  Eye: Does not have known diabetic retinopathy.  Neuro/ Feet: Does have known diabetic peripheral neuropathy. Renal: Patient does not have known baseline CKD. She is on an ACEI/ARB at present.    3) Dyslipidemia :  -Her LDL and Tg have trended down    Medication   Continue rosuvastatin 40 mg daily   F/U in 6 months   Signed electronically by: Lyndle Herrlich, MD  [facility]  [facility] [address] [place] [place], [place] [address] Phone: [phone] FAX: [phone]   CC: Deeann Saint, MD [address] [place] [address] Phone: [phone]  Fax: [phone]  Return to Endocrinology clinic as below: No future appointments.

## 2022-06-03 ENCOUNTER — Encounter: Payer: Self-pay | Admitting: Internal Medicine

## 2022-06-03 ENCOUNTER — Ambulatory Visit: Payer: 59 | Admitting: Internal Medicine

## 2022-06-03 VITALS — BP 130/82 | HR 91 | Ht 68.0 in | Wt 269.0 lb

## 2022-06-03 DIAGNOSIS — Z794 Long term (current) use of insulin: Secondary | ICD-10-CM

## 2022-06-03 DIAGNOSIS — E785 Hyperlipidemia, unspecified: Secondary | ICD-10-CM

## 2022-06-03 DIAGNOSIS — E1140 Type 2 diabetes mellitus with diabetic neuropathy, unspecified: Secondary | ICD-10-CM

## 2022-06-03 DIAGNOSIS — E1165 Type 2 diabetes mellitus with hyperglycemia: Secondary | ICD-10-CM

## 2022-06-03 LAB — LIPID PANEL
Cholesterol: 165 mg/dL (ref 0–200)
HDL: 50.2 mg/dL (ref >39.00)
LDL Cholesterol: 86 mg/dL (ref 0–99)
NonHDL: 115.04
Total CHOL/HDL Ratio: 3
Triglycerides: 146 mg/dL (ref 0.0–149.0)
VLDL: 29.2 mg/dL (ref 0.0–40.0)

## 2022-06-03 LAB — MICROALBUMIN / CREATININE URINE RATIO
Creatinine,U: 35.5 mg/dL
Microalb Creat Ratio: 7.8 mg/g (ref 0.0–30.0)
Microalb, Ur: 2.8 mg/dL — ABNORMAL HIGH (ref 0.0–1.9)

## 2022-06-03 LAB — POCT GLYCOSYLATED HEMOGLOBIN (HGB A1C): Hemoglobin A1C: 6.8 % — AB (ref 4.0–5.6)

## 2022-06-03 LAB — COMPREHENSIVE METABOLIC PANEL
ALT: 31 U/L (ref 0–35)
AST: 25 U/L (ref 0–37)
Albumin: 4.6 g/dL (ref 3.5–5.2)
Alkaline Phosphatase: 61 U/L (ref 39–117)
BUN: 14 mg/dL (ref 6–23)
CO2: 28 mEq/L (ref 19–32)
Calcium: 9.4 mg/dL (ref 8.4–10.5)
Chloride: 102 mEq/L (ref 96–112)
Creatinine, Ser: 0.67 mg/dL (ref 0.40–1.20)
GFR: 105.25 mL/min (ref >60.00)
Glucose, Bld: 136 mg/dL — ABNORMAL HIGH (ref 70–99)
Potassium: 4.3 mEq/L (ref 3.5–5.1)
Sodium: 139 mEq/L (ref 135–145)
Total Bilirubin: 0.6 mg/dL (ref 0.2–1.2)
Total Protein: 7.8 g/dL (ref 6.0–8.3)

## 2022-06-03 MED ORDER — TIRZEPATIDE 2.5 MG/0.5ML ~~LOC~~ SOAJ: 2.5000 mg | SUBCUTANEOUS | 3 refills | Status: DC

## 2022-06-03 NOTE — Patient Instructions (Addendum)
-   Continue Metformin 500 mg, 1 tablet daily  - Change Humalog  Mix 60 units with Breakfast and 50 units with Supper  - Continue Jardiance 25  mg, 1 tablet daily with breakfast  - Start Mounjaro 2.5 mg once weekly     - HOW TO TREAT LOW BLOOD SUGARS (Blood sugar LESS THAN 70 MG/DL) Please follow the RULE OF 15 for the treatment of hypoglycemia treatment (when your (blood sugars are less than 70 mg/dL)   STEP 1: Take 15 grams of carbohydrates when your blood sugar is low, which includes:  3-4 GLUCOSE TABS  OR 3-4 OZ OF JUICE OR REGULAR SODA OR ONE TUBE OF GLUCOSE GEL    STEP 2: RECHECK blood sugar in 15 MINUTES STEP 3: If your blood sugar is still low at the 15 minute recheck --> then, go back to STEP 1 and treat AGAIN with another 15 grams of carbohydrates.

## 2022-06-24 ENCOUNTER — Other Ambulatory Visit: Payer: Self-pay | Admitting: Cardiology

## 2022-06-24 DIAGNOSIS — I480 Paroxysmal atrial fibrillation: Secondary | ICD-10-CM

## 2022-08-27 ENCOUNTER — Ambulatory Visit (INDEPENDENT_AMBULATORY_CARE_PROVIDER_SITE_OTHER): Payer: 59 | Admitting: Family Medicine

## 2022-08-27 ENCOUNTER — Encounter: Payer: Self-pay | Admitting: Family Medicine

## 2022-08-27 VITALS — BP 130/90 | HR 78 | Temp 98.4°F | Ht 67.0 in | Wt 266.0 lb

## 2022-08-27 DIAGNOSIS — I480 Paroxysmal atrial fibrillation: Secondary | ICD-10-CM

## 2022-08-27 DIAGNOSIS — E781 Pure hyperglyceridemia: Secondary | ICD-10-CM | POA: Diagnosis not present

## 2022-08-27 DIAGNOSIS — J019 Acute sinusitis, unspecified: Secondary | ICD-10-CM

## 2022-08-27 DIAGNOSIS — Z23 Encounter for immunization: Secondary | ICD-10-CM

## 2022-08-27 DIAGNOSIS — R519 Headache, unspecified: Secondary | ICD-10-CM

## 2022-08-27 DIAGNOSIS — I10 Essential (primary) hypertension: Secondary | ICD-10-CM

## 2022-08-27 DIAGNOSIS — E1165 Type 2 diabetes mellitus with hyperglycemia: Secondary | ICD-10-CM | POA: Diagnosis not present

## 2022-08-27 DIAGNOSIS — Z0000 Encounter for general adult medical examination without abnormal findings: Secondary | ICD-10-CM

## 2022-08-27 DIAGNOSIS — Z794 Long term (current) use of insulin: Secondary | ICD-10-CM

## 2022-08-27 DIAGNOSIS — Z1211 Encounter for screening for malignant neoplasm of colon: Secondary | ICD-10-CM

## 2022-08-27 LAB — COMPREHENSIVE METABOLIC PANEL
ALT: 24 U/L (ref 0–35)
AST: 16 U/L (ref 0–37)
Albumin: 4.4 g/dL (ref 3.5–5.2)
Alkaline Phosphatase: 62 U/L (ref 39–117)
BUN: 12 mg/dL (ref 6–23)
CO2: 26 mEq/L (ref 19–32)
Calcium: 9.5 mg/dL (ref 8.4–10.5)
Chloride: 102 mEq/L (ref 96–112)
Creatinine, Ser: 0.64 mg/dL (ref 0.40–1.20)
GFR: 106.25 mL/min (ref >60.00)
Glucose, Bld: 111 mg/dL — ABNORMAL HIGH (ref 70–99)
Potassium: 4.1 mEq/L (ref 3.5–5.1)
Sodium: 138 mEq/L (ref 135–145)
Total Bilirubin: 0.5 mg/dL (ref 0.2–1.2)
Total Protein: 7.6 g/dL (ref 6.0–8.3)

## 2022-08-27 LAB — CBC WITH DIFFERENTIAL/PLATELET
Basophils Absolute: 0 10*3/uL (ref 0.0–0.1)
Basophils Relative: 0.6 % (ref 0.0–3.0)
Eosinophils Absolute: 0.3 10*3/uL (ref 0.0–0.7)
Eosinophils Relative: 3.3 % (ref 0.0–5.0)
HCT: 43 % (ref 36.0–46.0)
Hemoglobin: 15 g/dL (ref 12.0–15.0)
Lymphocytes Relative: 32.3 % (ref 12.0–46.0)
Lymphs Abs: 2.7 10*3/uL (ref 0.7–4.0)
MCHC: 34.8 g/dL (ref 30.0–36.0)
MCV: 88.4 fl (ref 78.0–100.0)
Monocytes Absolute: 0.5 10*3/uL (ref 0.1–1.0)
Monocytes Relative: 6.1 % (ref 3.0–12.0)
Neutro Abs: 4.8 10*3/uL (ref 1.4–7.7)
Neutrophils Relative %: 57.7 % (ref 43.0–77.0)
Platelets: 281 10*3/uL (ref 150.0–400.0)
RBC: 4.86 Mil/uL (ref 3.87–5.11)
RDW: 14 % (ref 11.5–15.5)
WBC: 8.3 10*3/uL (ref 4.0–10.5)

## 2022-08-27 LAB — T4, FREE: Free T4: 0.93 ng/dL (ref 0.60–1.60)

## 2022-08-27 LAB — TSH: TSH: 0.93 u[IU]/mL (ref 0.35–5.50)

## 2022-08-27 LAB — LIPID PANEL
Cholesterol: 154 mg/dL (ref 0–200)
HDL: 43.3 mg/dL (ref >39.00)
LDL Cholesterol: 91 mg/dL (ref 0–99)
NonHDL: 110.47
Total CHOL/HDL Ratio: 4
Triglycerides: 97 mg/dL (ref 0.0–149.0)
VLDL: 19.4 mg/dL (ref 0.0–40.0)

## 2022-08-27 LAB — HEMOGLOBIN A1C: Hgb A1c MFr Bld: 6.9 % — ABNORMAL HIGH (ref 4.6–6.5)

## 2022-08-27 MED ORDER — AMOXICILLIN 500 MG PO TABS: 500.0000 mg | ORAL_TABLET | Freq: Two times a day (BID) | ORAL | 0 refills | Status: AC

## 2022-08-27 MED ORDER — LISINOPRIL 20 MG PO TABS: 20.0000 mg | ORAL_TABLET | Freq: Every day | ORAL | 3 refills | Status: DC

## 2022-08-27 NOTE — Addendum Note (Signed)
Addended by: Encarnacion Slates on: 08/27/2022 12:56 PM   Modules accepted: Orders

## 2022-08-27 NOTE — Progress Notes (Signed)
Established Patient Office Visit   Subjective  Patient ID: Julia Dickerson, female    DOB: 1975-09-13  Age: 47 y.o. MRN: [id number]  Chief Complaint  Patient presents with   Annual Exam    Patient is a 47 year old female follow-up on chronic conditions.  Patient endorses intermittent sinus pressure and rhinorrhea times several months.  Currently having facial pain.  Using Flonase.  Patient notes increase in headaches and occasional throbbing sensation in head with standing up.  Taking lisinopril 10 mg daily and Cardizem 240 mg for BP.  Followed by endocrinology for diabetes.  On Mounjaro, Jardiance, Humulin.  States recent A1c less than 7.  Patient planning on getting colonoscopy but would like to wait till over the summer.      ROS Negative unless stated above    Objective:     BP (!) 130/90 (BP Location: Left Arm, Cuff Size: Large)   Pulse 78   Temp 98.4 F (36.9 C) (Oral)   Ht 5\' 7"  (1.702 m)   Wt 266 lb (120.7 kg)   LMP 11/23/2017   SpO2 97%   BMI 41.66 kg/m    Physical Exam Constitutional:      Appearance: Normal appearance.  HENT:     Head: Normocephalic and atraumatic.     Right Ear: Tympanic membrane, ear canal and external ear normal.     Left Ear: Tympanic membrane, ear canal and external ear normal.     Nose: Nose normal.     Mouth/Throat:     Mouth: Mucous membranes are moist.     Pharynx: No oropharyngeal exudate or posterior oropharyngeal erythema.  Eyes:     General: No scleral icterus.    Extraocular Movements: Extraocular movements intact.     Conjunctiva/sclera: Conjunctivae normal.     Pupils: Pupils are equal, round, and reactive to light.  Neck:     Thyroid: No thyromegaly.  Cardiovascular:     Rate and Rhythm: Normal rate and regular rhythm.     Pulses: Normal pulses.     Heart sounds: Normal heart sounds. No murmur heard.    No friction rub.  Pulmonary:     Effort: Pulmonary effort is normal.     Breath sounds: Normal breath  sounds. No wheezing, rhonchi or rales.  Abdominal:     General: Bowel sounds are normal.     Palpations: Abdomen is soft.     Tenderness: There is no abdominal tenderness.  Musculoskeletal:        General: No deformity. Normal range of motion.  Lymphadenopathy:     Cervical: No cervical adenopathy.  Skin:    General: Skin is warm and dry.     Findings: No lesion.  Neurological:     General: No focal deficit present.     Mental Status: She is alert and oriented to person, place, and time.  Psychiatric:        Mood and Affect: Mood normal.        Thought Content: Thought content normal.       03/11/2021    9:09 AM 10/02/2018   10:00 AM  Depression screen PHQ 2/9  Decreased Interest 1 0  Down, Depressed, Hopeless 2 0  PHQ - 2 Score 3 0  Altered sleeping 2   Tired, decreased energy 2   Change in appetite 0   Feeling bad or failure about yourself  2   Trouble concentrating 0   Moving slowly or fidgety/restless 0   Suicidal thoughts  0   PHQ-9 Score 9   Difficult doing work/chores Not difficult at all      No results found for any visits on 08/27/22.    Assessment & Plan:  Well adult exam -     TSH -     T4, free  Essential hypertension -Uncontrolled -Increase lisinopril from 10 mg to 20 mg daily. -Continue Cardizem CD 240 mg -     CBC with Differential/Platelet -     Comprehensive metabolic panel -     Lisinopril; Take 1 tablet (20 mg total) by mouth daily.  Dispense: 90 tablet; Refill: 3  Type 2 diabetes mellitus with hyperglycemia, with long-term current use of insulin (HCC) -Continue Jardiance milligrams daily, Humalog 75/25 40 units in a.m. and 48 units, Mounjaro 2.5 mg weekly -Continue follow-up with endocrinology -Continue statin, ACE I -Eye exam to be scheduled for this year -Foot exam done 06/03/2022 -Microalbumin creatinine ratio done 06/03/2022 -     Hemoglobin A1c -     Comprehensive metabolic panel  Influenza vaccine needed -     Flu Vaccine QUAD  53mo+IM (Fluarix, Fluzone & Alfiuria Quad PF)  Pure hypertriglyceridemia -Continue Crestor 40 mg daily -Continue lifestyle pt -     Lipid panel  Frequent headaches -Discussed possible causes including elevated BP -Will increase lisinopril dose.  For continued headaches patient to notify -     CBC with Differential/Platelet -     Comprehensive metabolic panel  Acute sinusitis, recurrence not specified, unspecified location -Continue Flonase/saline nasal rinse.  Discussed starting OTC antihistamine -Start ABX -     CBC with Differential/Platelet -     Amoxicillin; Take 1 tablet (500 mg total) by mouth 2 (two) times daily for 7 days.  Dispense: 14 tablet; Refill: 0  Morbid obesity (HCC) -Body mass index is 41.66 kg/m. -Lifestyle modification strongly encouraged -Continue Mounjaro for DM as also helping with weight  Colon cancer screening -Patient wishes schedule colonoscopy in the summer -     Ambulatory referral to Gastroenterology  Paroxysmal A-fib -Controlled -Continue current medications including Cardizem 240, lisinopril, Eliquis. -Okay to take Cardizem 60 mg as needed for breakthrough -Continue follow-up with cardiology  Return in about 1 month (around 09/25/2022) for blood pressure re-check.   Billie Ruddy, MD

## 2022-08-27 NOTE — Patient Instructions (Addendum)
New prescription for lisinopril 20 mg daily was sent to your pharmacy.  You can take 2 of the 10 mg tabs that you already have at home until you get your new prescription from the pharmacy.  Continue monitoring your blood pressure and keeping a log of readings.  A prescription for amoxicillin, an antibiotic was sent in for your sinus infection.

## 2022-09-07 ENCOUNTER — Other Ambulatory Visit: Payer: Self-pay | Admitting: Family Medicine

## 2022-09-07 DIAGNOSIS — J069 Acute upper respiratory infection, unspecified: Secondary | ICD-10-CM

## 2022-09-16 ENCOUNTER — Other Ambulatory Visit: Payer: Self-pay | Admitting: Internal Medicine

## 2022-09-18 ENCOUNTER — Other Ambulatory Visit: Payer: Self-pay | Admitting: Internal Medicine

## 2022-09-28 ENCOUNTER — Encounter: Payer: Self-pay | Admitting: Emergency Medicine

## 2022-09-28 ENCOUNTER — Other Ambulatory Visit: Payer: Self-pay

## 2022-09-28 ENCOUNTER — Ambulatory Visit
Admission: EM | Admit: 2022-09-28 | Discharge: 2022-09-28 | Disposition: A | Discharge status: Home / Self Care | Payer: 59 | Attending: Internal Medicine | Admitting: Internal Medicine

## 2022-09-28 DIAGNOSIS — R051 Acute cough: Secondary | ICD-10-CM

## 2022-09-28 DIAGNOSIS — J069 Acute upper respiratory infection, unspecified: Secondary | ICD-10-CM | POA: Diagnosis not present

## 2022-09-28 MED ORDER — AMOXICILLIN-POT CLAVULANATE 875-125 MG PO TABS: 1.0000 | ORAL_TABLET | Freq: Two times a day (BID) | ORAL | 0 refills | Status: DC

## 2022-09-28 MED ORDER — GUAIFENESIN 200 MG PO TABS: 200.0000 mg | ORAL_TABLET | ORAL | 0 refills | Status: DC | PRN

## 2022-09-28 MED ORDER — DOXYCYCLINE HYCLATE 100 MG PO CAPS: 100.0000 mg | ORAL_CAPSULE | Freq: Two times a day (BID) | ORAL | 0 refills | Status: AC

## 2022-09-28 MED ORDER — BENZONATATE 100 MG PO CAPS: 100.0000 mg | ORAL_CAPSULE | Freq: Three times a day (TID) | ORAL | 0 refills | Status: DC | PRN

## 2022-09-28 NOTE — Discharge Instructions (Addendum)
I have prescribed an antibiotic and a cough medication for your upper respiratory symptoms.  Please follow-up if any symptoms persist or worsen.

## 2022-09-28 NOTE — ED Triage Notes (Signed)
Pt here for nasal congestion and sinus pressure x 1 week with hx of recent sinus infection

## 2022-09-28 NOTE — ED Provider Notes (Signed)
[name]    CSN: [id number] Arrival date & time: 09/28/22  0943      History   Chief Complaint Chief Complaint  Patient presents with   Nasal Congestion    HPI Julia Dickerson is a 47 y.o. female.   Patient presents with nasal congestion, sinus pressure, cough that has been present for about 7 days.  Patient reports cough has become productive at times.  Denies chest pain, shortness of breath, nausea, vomiting, diarrhea, abdominal pain.  Patient did have sinus infection approximately 1 month ago and was treated with amoxicillin with resolution of symptoms.  Denies any known fevers or sick contacts.  Denies history of asthma or COPD.  Patient no longer smokes cigarettes but reports that she did in the past.  Has taken DayQuil, NyQuil with minimal improvement in symptoms.     Past Medical History:  Diagnosis Date   Anxiety    Arthritis    Atrial fibrillation (HCC)    a. paroxysmal, uses PRN Flecainide   Depression    Diabetes mellitus without complication ([place])    Dysrhythmia    GERD (gastroesophageal reflux disease)    Hypertension    Obesity    PCOS (polycystic ovarian syndrome)    UTI (lower urinary tract infection)     Patient Active Problem List   Diagnosis Date Noted   Secondary hypercoagulable state ([place]) 07/31/2020   A-fib ([place]) 07/28/2020   Type 2 diabetes mellitus with hyperglycemia, with long-term current use of insulin ([place]) 02/11/2020   Hypertriglyceridemia 06/15/2019   OSA (obstructive sleep apnea) 10/05/2018   Weight loss 10/05/2018   Former smoker 08/07/2018   PCOS (polycystic ovarian syndrome) 08/03/2018   Chronic diarrhea 09/19/2017   Neuropathic pain 09/19/2017   Type 2 diabetes mellitus with diabetic neuropathy, unspecified ([place]) 08/19/2017   Hyperlipidemia associated with type 2 diabetes mellitus ([place]) 08/19/2017   Current use of long term anticoagulation 05/31/2017   Atrial fibrillation with RVR ([place])    Abnormal EKG  06/24/2016   Paroxysmal atrial fibrillation ([place]): CHA2DS2Vasc ~3 06/23/2016   Essential hypertension 06/23/2016   Major depression, recurrent ([place]) 07/10/2014    Past Surgical History:  Procedure Laterality Date   DILATION AND CURETTAGE OF UTERUS  2011 and 2012   x2   GXT - Graded Exercise Tolerance Test  07/22/2016   Exercise time 6 minutes - 7 METs. Maximum heart rate 146 BPM. - No EKG changes. No arrhythmias or chest pain.: Low risk test.   TRANSTHORACIC ECHOCARDIOGRAM  06/2016   EF 6570% with mild LVH. Normal wall motion and diastolic function. Mild RAE. Otherwise normal.   WISDOM TOOTH EXTRACTION      OB History     Gravida  2   Para      Term      Preterm      AB  2   Living  0      SAB      IAB      Ectopic      Multiple      Live Births  0            Home Medications    Prior to Admission medications   Medication Sig Start Date End Date Taking? Authorizing Provider  doxycycline (VIBRAMYCIN) 100 MG capsule Take 1 capsule (100 mg total) by mouth 2 (two) times daily for 7 days. 09/28/22 10/05/22 Yes Dangelo Guzzetta, Michele Rockers, FNP  guaiFENesin 200 MG tablet Take 1 tablet (200 mg total) by  mouth every 4 (four) hours as needed for cough or to loosen phlegm. 09/28/22  Yes Hoover Grewe, Hildred Alamin E, FNP  Continuous Blood Gluc Receiver (DEXCOM G6 RECEIVER) DEVI Use as instructed to check blood sugar daily. 08/22/20   Shamleffer, Melanie Crazier, MD  Continuous Blood Gluc Sensor (DEXCOM G6 SENSOR) MISC USE 1 SENSOR AS DIRECTED 09/19/22   Shamleffer, Melanie Crazier, MD  Continuous Blood Gluc Transmit (DEXCOM G6 TRANSMITTER) MISC USE 1 DEVICE AS DIRECTED 11/01/21   Shamleffer, Melanie Crazier, MD  diltiazem (CARDIZEM CD) 240 MG 24 hr capsule TAKE 1 CAPSULE BY MOUTH DAILY 06/24/22   Leonie Man, MD  diltiazem (CARDIZEM) 60 MG tablet May take 60 mg  by mouth every 6 hours as needed up to 3 dose a day for breakthrough along with using Flecainide 100 mg. 04/08/21   Leonie Man, MD   DULoxetine (CYMBALTA) 30 MG capsule Take 30 mg by mouth daily.    [provider]  ELIQUIS 5 MG TABS tablet TAKE 1 TABLET BY MOUTH  TWICE DAILY 10/30/21   Leonie Man, MD  flecainide (TAMBOCOR) 100 MG tablet Take 1 tablet (100 mg total) by mouth 2 (two) times daily as needed. For breakthrough afib 07/28/20 08/27/20  Sherol Dade E, PA-C  fluticasone (FLONASE) 50 MCG/ACT nasal spray USE 1 SPRAY IN BOTH NOSTRILS  DAILY 09/07/22   Billie Ruddy, MD  Insulin Lispro Prot & Lispro (HUMALOG MIX 75/25 KWIKPEN) (75-25) 100 UNIT/ML Kwikpen Inject 40 Units into the skin daily with breakfast AND 48 Units daily with supper. 12/02/21   Shamleffer, Melanie Crazier, MD  Insulin Pen Needle (BD PEN NEEDLE NANO U/F) 32G X 4 MM MISC USE AS DIRECTED 4 TIMES DAILY 09/16/22   Shamleffer, Melanie Crazier, MD  JARDIANCE 25 MG TABS tablet TAKE 1 TABLET BY MOUTH  DAILY BEFORE BREAKFAST 01/27/22   Shamleffer, Melanie Crazier, MD  lisinopril (ZESTRIL) 20 MG tablet Take 1 tablet (20 mg total) by mouth daily. 08/27/22   Billie Ruddy, MD  metFORMIN (GLUCOPHAGE-XR) 500 MG 24 hr tablet Take 1 tablet (500 mg total) by mouth daily with breakfast. 12/02/21   Shamleffer, Melanie Crazier, MD  Multiple Vitamin (MULTIVITAMIN WITH MINERALS) TABS tablet Take 1 tablet by mouth daily.    [provider]  Multiple Vitamins-Minerals (ZINC PO) Take 1 tablet by mouth in the morning and at bedtime.    [provider]  omeprazole (PRILOSEC) 20 MG capsule Take 20 mg by mouth every evening. 02/09/10   [provider]  rosuvastatin (CRESTOR) 40 MG tablet Take 1 tablet (40 mg total) by mouth daily. 12/04/21   Shamleffer, Melanie Crazier, MD  tirzepatide [facility]) 2.5 MG/0.5ML Pen Inject 2.5 mg into the skin once a week. 06/03/22   Shamleffer, Melanie Crazier, MD    Family History Family History  Problem Relation Age of Onset   Arthritis Mother    Diabetes Mother    Kidney disease Mother    Heart failure  Father    Heart attack Father    Cancer Father    Diabetes Father    Hearing loss Father    Hyperlipidemia Father    Hypertension Father    Arthritis Sister    COPD Maternal Grandmother    Heart disease Maternal Grandmother    Hyperlipidemia Maternal Grandmother    Hyperlipidemia Maternal Grandfather    Hypertension Maternal Grandfather    Hearing loss Maternal Grandfather    Alcohol abuse Maternal Grandfather    Arthritis Maternal Grandfather  Stroke Maternal Grandfather    Hyperlipidemia Paternal Grandmother    Miscarriages / Stillbirths Paternal Grandmother    Kidney disease Paternal Grandfather    Hypertension Paternal Grandfather    Heart attack Paternal Grandfather    Diabetes Paternal Grandfather    Breast cancer Neg Hx     Social History Social History   Tobacco Use   Smoking status: Former    Packs/day: 0.50    Years: 21.00    Total pack years: 10.50    Types: Cigarettes, E-cigarettes    Quit date: 08/24/2018    Years since quitting: 4.0   Smokeless tobacco: Never   Tobacco comments:    Using nicotine patches  Vaping Use   Vaping Use: Never used  Substance Use Topics   Alcohol use: Not Currently   Drug use: No     Allergies   Sulfa antibiotics   Review of Systems Review of Systems Per HPI  Physical Exam Triage Vital Signs ED Triage Vitals [09/28/22 1010]  Enc Vitals Group     BP (!) 158/90     Pulse Rate 96     Resp 18     Temp 98.6 F (37 C)     Temp Source Oral     SpO2 95 %     Weight      Height      Head Circumference      Peak Flow      Pain Score 5     Pain Loc      Pain Edu?      Excl. in [place]?    No data found.  Updated Vital Signs BP (!) 158/90 (BP Location: Left Arm)   Pulse 96   Temp 98.6 F (37 C) (Oral)   Resp 18   LMP 11/23/2017   SpO2 95%   Visual Acuity Right Eye Distance:   Left Eye Distance:   Bilateral Distance:    Right Eye Near:   Left Eye Near:    Bilateral Near:     Physical  Exam Constitutional:      General: She is not in acute distress.    Appearance: Normal appearance. She is not toxic-appearing or diaphoretic.  HENT:     Head: Normocephalic and atraumatic.     Right Ear: Tympanic membrane and ear canal normal.     Left Ear: Tympanic membrane and ear canal normal.     Nose: Congestion present.     Mouth/Throat:     Mouth: Mucous membranes are moist.     Pharynx: No posterior oropharyngeal erythema.  Eyes:     Extraocular Movements: Extraocular movements intact.     Conjunctiva/sclera: Conjunctivae normal.     Pupils: Pupils are equal, round, and reactive to light.  Cardiovascular:     Rate and Rhythm: Normal rate and regular rhythm.     Pulses: Normal pulses.     Heart sounds: Normal heart sounds.  Pulmonary:     Effort: Pulmonary effort is normal. No respiratory distress.     Breath sounds: Normal breath sounds. No stridor. No wheezing, rhonchi or rales.  Abdominal:     General: Abdomen is flat. Bowel sounds are normal.     Palpations: Abdomen is soft.  Musculoskeletal:        General: Normal range of motion.     Cervical back: Normal range of motion.  Skin:    General: Skin is warm and dry.  Neurological:     General: No focal deficit  present.     Mental Status: She is alert and oriented to person, place, and time. Mental status is at baseline.  Psychiatric:        Mood and Affect: Mood normal.        Behavior: Behavior normal.      UC Treatments / Results  Labs (all labs ordered are listed, but only abnormal results are displayed) Labs Reviewed - No data to display  EKG   Radiology No results found.  Procedures Procedures (including critical care time)  Medications Ordered in UC Medications - No data to display  Initial Impression / Assessment and Plan / UC Course  I have reviewed the triage vital signs and the nursing notes.  Pertinent labs & imaging results that were available during my care of the patient were  reviewed by me and considered in my medical decision making (see chart for details).     Patient's symptoms are most likely viral in etiology but given persistence of symptoms and symptoms being refractory to over-the-counter medications, will opt to treat with doxycycline for secondary bacterial infection.  Guaifenesin prescribed to take as needed for cough and congestion.  Do not think chest imaging is necessary given no adventitious lung sounds on exam.  Advised supportive care and symptom management.  Patient declined COVID testing.  Discussed return precautions.  Patient verbalized understanding and was agreeable with plan. Final Clinical Impressions(s) / UC Diagnoses   Final diagnoses:  Acute upper respiratory infection  Acute cough     Discharge Instructions      I have prescribed an antibiotic and a cough medication for your upper respiratory symptoms.  Please follow-up if any symptoms persist or worsen.    ED Prescriptions     Medication Sig Dispense Auth. Provider   amoxicillin-clavulanate (AUGMENTIN) 875-125 MG tablet  (Status: Discontinued) Take 1 tablet by mouth every 12 (twelve) hours. 14 tablet [place], [place] E, [place]   benzonatate (TESSALON) 100 MG capsule  (Status: Discontinued) Take 1 capsule (100 mg total) by mouth every 8 (eight) hours as needed for cough. 21 capsule [place], [place] E, [place]   doxycycline (VIBRAMYCIN) 100 MG capsule Take 1 capsule (100 mg total) by mouth 2 (two) times daily for 7 days. 14 capsule [place], [place] E, [place]   guaiFENesin 200 MG tablet Take 1 tablet (200 mg total) by mouth every 4 (four) hours as needed for cough or to loosen phlegm. 30 tablet [place], Michele Rockers, [place]      PDMP not reviewed this encounter.   Teodora Medici, [place] 09/28/22 1034

## 2022-10-08 ENCOUNTER — Encounter: Payer: Self-pay | Admitting: Internal Medicine

## 2022-10-08 ENCOUNTER — Ambulatory Visit: Payer: 59 | Admitting: Internal Medicine

## 2022-10-08 VITALS — BP 136/80 | HR 94 | Ht 67.0 in | Wt 262.0 lb

## 2022-10-08 DIAGNOSIS — E1140 Type 2 diabetes mellitus with diabetic neuropathy, unspecified: Secondary | ICD-10-CM | POA: Diagnosis not present

## 2022-10-08 DIAGNOSIS — E785 Hyperlipidemia, unspecified: Secondary | ICD-10-CM

## 2022-10-08 MED ORDER — EMPAGLIFLOZIN 25 MG PO TABS: 25.0000 mg | ORAL_TABLET | Freq: Every day | ORAL | 3 refills | Status: DC

## 2022-10-08 MED ORDER — METFORMIN HCL ER 500 MG PO TB24: 500.0000 mg | ORAL_TABLET | Freq: Every day | ORAL | 3 refills | Status: DC

## 2022-10-08 MED ORDER — DEXCOM G7 SENSOR MISC: 1.0000 | 3 refills | Status: DC

## 2022-10-08 MED ORDER — INSULIN LISPRO PROT & LISPRO (75-25 MIX) 100 UNIT/ML KWIKPEN: PEN_INJECTOR | SUBCUTANEOUS | 3 refills | Status: DC

## 2022-10-08 MED ORDER — BD PEN NEEDLE NANO U/F 32G X 4 MM MISC: 1.0000 | Freq: Two times a day (BID) | 3 refills | Status: DC

## 2022-10-08 MED ORDER — ROSUVASTATIN CALCIUM 40 MG PO TABS: 40.0000 mg | ORAL_TABLET | Freq: Every day | ORAL | 3 refills | Status: DC

## 2022-10-08 MED ORDER — TIRZEPATIDE 5 MG/0.5ML ~~LOC~~ SOAJ: 5.0000 mg | SUBCUTANEOUS | 3 refills | Status: DC

## 2022-10-08 NOTE — Patient Instructions (Signed)
-   Increase Mounjaro 5 mg , once weekly  - Continue Metformin 500 mg, 1 tablet daily  - Change Humalog  Mix 56 units with Breakfast and 46 units with Supper  - Continue Jardiance 25  mg, 1 tablet daily with breakfast     - HOW TO TREAT LOW BLOOD SUGARS (Blood sugar LESS THAN 70 MG/DL) Please follow the RULE OF 15 for the treatment of hypoglycemia treatment (when your (blood sugars are less than 70 mg/dL)   STEP 1: Take 15 grams of carbohydrates when your blood sugar is low, which includes:  3-4 GLUCOSE TABS  OR 3-4 OZ OF JUICE OR REGULAR SODA OR ONE TUBE OF GLUCOSE GEL    STEP 2: RECHECK blood sugar in 15 MINUTES STEP 3: If your blood sugar is still low at the 15 minute recheck --> then, go back to STEP 1 and treat AGAIN with another 15 grams of carbohydrates.

## 2022-10-08 NOTE — Progress Notes (Signed)
Name: Julia Dickerson  Age/ Sex: 47 y.o., female   MRN/ DOB: [id number], Oct 31, 1975     PCP: Billie Ruddy, MD   Reason for Endocrinology Evaluation: Type 2 diabetes Mellitus  Initial Endocrine Consultative Visit:  09/05/2018    PATIENT IDENTIFIER: [name] is a 47 y.o. female with a past medical history of PCOS, T2DM, PAF and HTN.  The patient has followed with Endocrinology clinic since 09/05/2018 for consultative assistance with management of her diabetes.  DIABETIC HISTORY:  Julia Dickerson was diagnosed with T2DM since the 2000's.  She was initially on metformin, she was switched to MDI insulin regimen which helped control her hyperglycemia at the time, the patient was subsequently switched to oral glycemic agents in 2012 due to improved glucose control.  She was restarted on basal insulin in 2019 due to persistent hyperglycemia, she has tried Victoza in the past with severe nausea. Her hemoglobin A1c has ranged from 8.7% in in 2019, peaking at 10.7% in 08/14/2018.   Prandial insulin started in February 2020, in addition to continuing her metformin and basal insulin. Rybelsus is cost prohibitive   By 05/2019 we switched her MDI regimen to insulin mix, we started Trulicity, and continued metformin.  Trulicity stopped [id number] due to GI side effects  Jardiance started 07/2020  [place] 05/2022  SUBJECTIVE:   During the last visit (06/03/2022): A1c 7.3 %.     Today (10/08/2022): Julia Dickerson is here for a follow up on diabetes management. She checks glucose multiple x a day, she has not had any  hypoglycemic episodes since her last clinic visit.    She has acid reflux and uses PPI, which has been stable on mounjaro  Denies diarrhea   CONTINUOUS GLUCOSE MONITORING RECORD INTERPRETATION    Dates of Recording: 3/2-3/15/2024  Sensor description:dexcom   Results statistics:   CGM use % of time 79%  Average and SD 201/48  Time in range 37 %  % Time Above 180 47   % Time above 250 15  % Time Below target <1     Glycemic patterns summary:Bg's trend down at night and fluctuate during the day   Hyperglycemic episodes postprandial  Hypoglycemic episodes occurred N/A  Overnight periods: trend down     HOME DIABETES REGIMEN:  NovoLog mix 60  units with Breakfast and 50 units with Supper  Metformin 500 mg XR 1 tab daily  Jardiance 25 mg daily  Mounjaro 2.5 mg once weekly Rosuvastatin 40 mg daily    DIABETIC COMPLICATIONS: Microvascular complications:  Neuropathy  Denies: CKD, retinopathy  Last eye exam: Completed 11/2021   Macrovascular complications:    Denies: CAD, PVD, CVA     HISTORY:  Past Medical History:  Past Medical History:  Diagnosis Date   Anxiety    Arthritis    Atrial fibrillation (HCC)    a. paroxysmal, uses PRN Flecainide   Depression    Diabetes mellitus without complication ([place])    Dysrhythmia    GERD (gastroesophageal reflux disease)    Hypertension    Obesity    PCOS (polycystic ovarian syndrome)    UTI (lower urinary tract infection)    Past Surgical History:  Past Surgical History:  Procedure Laterality Date   DILATION AND CURETTAGE OF UTERUS  2011 and 2012   x2   GXT - Graded Exercise Tolerance Test  07/22/2016   Exercise time 6 minutes - 7 METs. Maximum heart rate 146 BPM. - No EKG changes. No  arrhythmias or chest pain.: Low risk test.   TRANSTHORACIC ECHOCARDIOGRAM  06/2016   EF 6570% with mild LVH. Normal wall motion and diastolic function. Mild RAE. Otherwise normal.   WISDOM TOOTH EXTRACTION     Social History:  reports that she quit smoking about 4 years ago. Her smoking use included cigarettes and e-cigarettes. She has a 10.50 pack-year smoking history. She has never used smokeless tobacco. She reports that she does not currently use alcohol. She reports that she does not use drugs. Family History:  Family History  Problem Relation Age of Onset   Arthritis Mother    Diabetes Mother     Kidney disease Mother    Heart failure Father    Heart attack Father    Cancer Father    Diabetes Father    Hearing loss Father    Hyperlipidemia Father    Hypertension Father    Arthritis Sister    COPD Maternal Grandmother    Heart disease Maternal Grandmother    Hyperlipidemia Maternal Grandmother    Hyperlipidemia Maternal Grandfather    Hypertension Maternal Grandfather    Hearing loss Maternal Grandfather    Alcohol abuse Maternal Grandfather    Arthritis Maternal Grandfather    Stroke Maternal Grandfather    Hyperlipidemia Paternal Grandmother    Miscarriages / Stillbirths Paternal Grandmother    Kidney disease Paternal Grandfather    Hypertension Paternal Grandfather    Heart attack Paternal Grandfather    Diabetes Paternal Grandfather    Breast cancer Neg Hx      HOME MEDICATIONS: Allergies as of 10/08/2022       Reactions   Sulfa Antibiotics Hives        Medication List        Accurate as of October 08, 2022  8:43 AM. If you have any questions, ask your nurse or doctor.          STOP taking these medications    guaiFENesin 200 MG tablet Stopped by: Dorita Sciara, MD       TAKE these medications    BD Pen Needle Nano U/F 32G X 4 MM Misc Generic drug: Insulin Pen Needle USE AS DIRECTED 4 TIMES DAILY   Dexcom G6 Receiver Devi Use as instructed to check blood sugar daily.   Dexcom G6 Sensor Misc USE 1 SENSOR AS DIRECTED   Dexcom G6 Transmitter Misc USE 1 DEVICE AS DIRECTED   diltiazem 240 MG 24 hr capsule Commonly known as: CARDIZEM CD TAKE 1 CAPSULE BY MOUTH DAILY   diltiazem 60 MG tablet Commonly known as: CARDIZEM May take 60 mg  by mouth every 6 hours as needed up to 3 dose a day for breakthrough along with using Flecainide 100 mg.   DULoxetine 30 MG capsule Commonly known as: CYMBALTA Take 30 mg by mouth daily.   Eliquis 5 MG Tabs tablet Generic drug: apixaban TAKE 1 TABLET BY MOUTH  TWICE DAILY   flecainide 100  MG tablet Commonly known as: TAMBOCOR Take 1 tablet (100 mg total) by mouth 2 (two) times daily as needed. For breakthrough afib   fluticasone 50 MCG/ACT nasal spray Commonly known as: FLONASE USE 1 SPRAY IN BOTH NOSTRILS  DAILY   Insulin Lispro Prot & Lispro (75-25) 100 UNIT/ML Kwikpen Commonly known as: HumaLOG Mix 75/25 KwikPen Inject 40 Units into the skin daily with breakfast AND 48 Units daily with supper. What changed: See the new instructions.   Jardiance 25 MG Tabs tablet Generic drug: empagliflozin  TAKE 1 TABLET BY MOUTH  DAILY BEFORE BREAKFAST   lisinopril 20 MG tablet Commonly known as: ZESTRIL Take 1 tablet (20 mg total) by mouth daily.   metFORMIN 500 MG 24 hr tablet Commonly known as: GLUCOPHAGE-XR Take 1 tablet (500 mg total) by mouth daily with breakfast.   multivitamin with minerals Tabs tablet Take 1 tablet by mouth daily.   omeprazole 20 MG capsule Commonly known as: PRILOSEC Take 20 mg by mouth every evening.   rosuvastatin 40 MG tablet Commonly known as: CRESTOR Take 1 tablet (40 mg total) by mouth daily.   tirzepatide 2.5 MG/0.5ML Pen Commonly known as: MOUNJARO Inject 2.5 mg into the skin once a week.   ZINC PO Take 1 tablet by mouth in the morning and at bedtime.         OBJECTIVE:   Vital Signs: BP 136/80 (BP Location: Left Arm, Patient Position: Sitting, Cuff Size: Large)   Pulse 94   Ht 5\' 7"  (1.702 m)   Wt 262 lb (118.8 kg)   LMP 11/23/2017   SpO2 96%   BMI 41.04 kg/m   Wt Readings from Last 3 Encounters:  10/08/22 262 lb (118.8 kg)  08/27/22 266 lb (120.7 kg)  06/03/22 269 lb (122 kg)     Exam: General: Pt appears well and is in NAD  Lungs: Clear with good BS bilat   Heart: RRR   Abdomen: Normoactive bowel sounds, soft, nontender, without masses or organomegaly palpable  Extremities: No pretibial edema.   Neuro: MS is good with appropriate affect, pt is alert and Ox3        DM foot exam: 06/03/2022 The skin of  the feet is intact without sores or ulcerations. The pedal pulses are 2+ on right and 2+ on left. The sensation is absent  to a screening 5.07, 10 gram monofilament bilaterally       DATA REVIEWED:  Lab Results  Component Value Date   HGBA1C 6.9 (H) 08/27/2022   HGBA1C 6.8 (A) 06/03/2022   HGBA1C 7.3 (A) 12/02/2021    Latest Reference Range & Units 08/27/22 11:50  Sodium 135 - 145 mEq/L 138  Potassium 3.5 - 5.1 mEq/L 4.1  Chloride 96 - 112 mEq/L 102  CO2 19 - 32 mEq/L 26  Glucose 70 - 99 mg/dL 111 (H)  BUN 6 - 23 mg/dL 12  Creatinine 0.40 - 1.20 mg/dL 0.64  Calcium 8.4 - 10.5 mg/dL 9.5  Alkaline Phosphatase 39 - 117 U/L 62  Albumin 3.5 - 5.2 g/dL 4.4  AST 0 - 37 U/L 16  ALT 0 - 35 U/L 24  Total Protein 6.0 - 8.3 g/dL 7.6  Total Bilirubin 0.2 - 1.2 mg/dL 0.5  GFR >60.00 mL/min 106.25    Latest Reference Range & Units 08/27/22 11:50  Total CHOL/HDL Ratio  4  Cholesterol 0 - 200 mg/dL 154  HDL Cholesterol >39.00 mg/dL 43.30  LDL (calc) 0 - 99 mg/dL 91  NonHDL  110.47  Triglycerides 0.0 - 149.0 mg/dL 97.0  VLDL 0.0 - 40.0 mg/dL 19.4    Latest Reference Range & Units 08/27/22 11:50  Hemoglobin A1C 4.6 - 6.5 % 6.9 (H)  TSH 0.35 - 5.50 uIU/mL 0.93  T4,Free(Direct) 0.60 - 1.60 ng/dL 0.93  (H): Data is abnormally high   ASSESSMENT / PLAN / RECOMMENDATIONS:   1) Type 2 Diabetes Mellitus,optimally controlled , With Neuropathic Complications - Most recent A1c of 6.9 %. Goal A1c < 7.0 %.    -Praised the patient  on improved glycemic control -She is intolerant to Trulicity and higher doses of metformin - Tolerating Mounjaro , will increase as below and decrease insulin to prevent hypoglycemia    MEDICATIONS: - Continue  Metformin 500 mg, 1 tablet daily  - Change Humalog Mix 56 units with Breakfast  and 46 units with Supper  - Continue Jardiance 25 mg, 1 tablet daily with breakfast  - Increase Mounjaro 5 mg once weekly    EDUCATION / INSTRUCTIONS: BG monitoring  instructions: Patient is instructed to check her blood sugars 4 times a day, fasting and bedtime. Call [facility] if: BG persistently < 70 . I reviewed the Rule of 15 for the treatment of hypoglycemia in detail with the patient. Literature supplied.    2) Diabetic complications:  Eye: Does not have known diabetic retinopathy.  Neuro/ Feet: Does have known diabetic peripheral neuropathy. Renal: Patient does not have known baseline CKD. She is on an ACEI/ARB at present.    3) Dyslipidemia :  -Her LDL and Tg acceptable    Medication   Continue rosuvastatin 40 mg daily   F/U in 6 months   Signed electronically by: Mack Guise, MD  [facility]  [facility] [address] [place], [place] [address] Phone: [phone] FAX: [phone]   CC: Billie Ruddy, [place] [address] Phone: [phone]  Fax: [phone]  Return to Endocrinology clinic as below: Future Appointments  Date Time Provider [place]  10/08/2022  8:50 AM Nataly Pacifico, Melanie Crazier, MD [facility] None

## 2022-10-18 ENCOUNTER — Encounter: Payer: Self-pay | Admitting: Internal Medicine

## 2022-10-18 DIAGNOSIS — E1140 Type 2 diabetes mellitus with diabetic neuropathy, unspecified: Secondary | ICD-10-CM

## 2022-10-18 MED ORDER — DEXCOM G7 SENSOR MISC: 3 refills | Status: DC

## 2022-10-18 NOTE — Addendum Note (Signed)
Addended by: Lauralyn Primes on: 10/18/2022 05:50 PM   Modules accepted: Orders

## 2022-11-14 ENCOUNTER — Other Ambulatory Visit: Payer: Self-pay | Admitting: Cardiology

## 2022-11-15 NOTE — Telephone Encounter (Signed)
Prescription refill request for Eliquis received. Indication: PAF Last office visit: 11/27/20  Ranae Palms Scr: 0.64 on 08/27/22 Age: 47 Weight: 117.8kg  Based on above findings Eliquis  twice daily is the appropriate dose.  Ptis past due for appt with Dr Herbie [place].  Message sent to schedulers to call pt. Refill approved x 1 only till appt is made.

## 2022-12-08 ENCOUNTER — Other Ambulatory Visit: Payer: Self-pay | Admitting: Cardiology

## 2022-12-08 NOTE — Telephone Encounter (Signed)
Prescription refill request for Eliquis received. Indication: Afib  Last office visit: 11/27/20 Julia Dickerson)  Scr: 0.64 (08/27/22)  Age: 47 Weight: 118.8kg  Office visit overdue. Message sent to schedulers.

## 2022-12-08 NOTE — Telephone Encounter (Signed)
Called pt to make her aware of overdue office visit, no answer, left detailed message on voicemail.

## 2022-12-16 ENCOUNTER — Encounter: Payer: Self-pay | Admitting: Cardiology

## 2022-12-16 ENCOUNTER — Telehealth: Payer: Self-pay | Admitting: Cardiology

## 2022-12-16 NOTE — Telephone Encounter (Signed)
Patient contacted 3x with no success in scheduling. Will send letter to patient.

## 2022-12-16 NOTE — Telephone Encounter (Signed)
-----   Message from Memory Dance, RN sent at 12/08/2022  7:54 AM EDT ----- Good Morning,  This pt needs a provider appt to receive future medication refills.   Thanks,  Cammy Copa, Charity fundraiser

## 2023-03-04 ENCOUNTER — Telehealth: Payer: Self-pay | Admitting: Internal Medicine

## 2023-03-04 MED ORDER — TIRZEPATIDE 2.5 MG/0.5ML ~~LOC~~ SOAJ: 2.5000 mg | SUBCUTANEOUS | 3 refills | Status: DC

## 2023-03-04 NOTE — Telephone Encounter (Signed)
Patient has been on the [facility] for 2 weeks. She is experiencing lots of bloating with gas and she has a sulfur taste in her mouth. Patient wants to know if there  is something else she can take.

## 2023-03-04 NOTE — Telephone Encounter (Signed)
LVM for a return call.

## 2023-03-04 NOTE — Telephone Encounter (Signed)
Patient is advising she is having issues with the higher dose of her Mounjoro. Please call patient

## 2023-03-04 NOTE — Addendum Note (Signed)
Addended by: Scarlette Shorts on: 03/04/2023 03:07 PM   Modules accepted: Orders

## 2023-03-04 NOTE — Telephone Encounter (Signed)
Patient is aware 

## 2023-03-09 ENCOUNTER — Other Ambulatory Visit: Payer: Self-pay

## 2023-03-09 DIAGNOSIS — I10 Essential (primary) hypertension: Secondary | ICD-10-CM

## 2023-03-09 MED ORDER — LISINOPRIL 20 MG PO TABS: 20.0000 mg | ORAL_TABLET | Freq: Every day | ORAL | 1 refills | Status: DC

## 2023-04-18 ENCOUNTER — Ambulatory Visit: Payer: 59 | Admitting: Internal Medicine

## 2023-05-11 ENCOUNTER — Encounter: Payer: Self-pay | Admitting: Radiology

## 2023-05-11 ENCOUNTER — Ambulatory Visit (INDEPENDENT_AMBULATORY_CARE_PROVIDER_SITE_OTHER): Payer: 59 | Admitting: Radiology

## 2023-05-11 ENCOUNTER — Other Ambulatory Visit (HOSPITAL_COMMUNITY)
Admission: RE | Admit: 2023-05-11 | Discharge: 2023-05-11 | Disposition: A | Discharge status: Home / Self Care | Payer: 59 | Source: Ambulatory Visit | Attending: Radiology | Admitting: Radiology

## 2023-05-11 VITALS — BP 114/74 | Ht 67.0 in | Wt 247.0 lb

## 2023-05-11 DIAGNOSIS — Z01419 Encounter for gynecological examination (general) (routine) without abnormal findings: Secondary | ICD-10-CM | POA: Insufficient documentation

## 2023-05-11 DIAGNOSIS — N870 Mild cervical dysplasia: Secondary | ICD-10-CM | POA: Insufficient documentation

## 2023-05-11 DIAGNOSIS — N760 Acute vaginitis: Secondary | ICD-10-CM

## 2023-05-11 LAB — WET PREP FOR TRICH, YEAST, CLUE

## 2023-05-11 MED ORDER — NYSTATIN-TRIAMCINOLONE 100000-0.1 UNIT/GM-% EX OINT
1.0000 | TOPICAL_OINTMENT | Freq: Two times a day (BID) | CUTANEOUS | 0 refills | Status: DC
Start: 2023-05-11 — End: 2023-08-17

## 2023-05-11 NOTE — Progress Notes (Signed)
   Julia Dickerson 1975-09-01 [id number]   History: Postmenopausal 47 y.o. presents for annual exam. 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 C/o vulvar itching x a few months. No discharge or bleeding. No other gyn concerns.  Gynecologic History Postmenopausal Last Pap: 2023. Results were: abnormal Last mammogram: 2022. Results were: normal Last colonoscopy: never   Obstetric History OB History  Gravida Para Term Preterm AB Living  2       2 0  SAB IAB Ectopic Multiple Live Births          0    # Outcome Date GA Lbr Len/2nd Weight Sex Type Anes PTL Lv  2 AB           1 AB              The following portions of the patient's history were reviewed and updated as appropriate: allergies, current medications, past family history, past medical history, past social history, past surgical history, and problem list.  Review of Systems Pertinent items noted in HPI and remainder of comprehensive ROS otherwise negative.  Past medical history, past surgical history, family history and social history were all reviewed and documented in the EPIC chart.  Exam:  Vitals:   05/11/23 1552  BP: 114/74  Weight: 247 lb (112 kg)  Height: 5\' 7"  (1.702 m)   Body mass index is 38.69 kg/m.  General appearance:  Normal obese Thyroid:  Symmetrical, normal in size, without palpable masses or nodularity. Respiratory  Auscultation:  Clear without wheezing or rhonchi Cardiovascular  Auscultation:  Regular rate, without rubs, murmurs or gallops  Edema/varicosities:  Not grossly evident Abdominal  Soft,nontender, without masses, guarding or rebound.  Liver/spleen:  No organomegaly noted  Hernia:  None appreciated  Skin  Inspection:  Grossly normal Breasts: Examined lying and sitting.   Right: Without masses, retractions, nipple discharge or axillary adenopathy.   Left: Without masses, retractions, nipple discharge or axillary adenopathy. Genitourinary   Inguinal/mons:  Normal without inguinal  adenopathy  External genitalia:  erythema to bilateral labia minora  BUS/Urethra/Skene's glands:  Normal  Vagina:  Normal appearing with normal color, no lesions +yellow thin discharge. Atrophy: mild   Cervix:  Normal appearing without discharge or lesions  Uterus:  Normal in size, shape and contour.  Midline and mobile, nontender  Adnexa/parametria:     Rt: Normal in size, without masses or tenderness.   Lt: Normal in size, without masses or tenderness.  Anus and perineum: Normal    Raynelle Fanning, CMA present for exam Microscopic wet-mount exam shows negative for pathogens, normal epithelial cells.   Assessment/Plan:   1. Well woman exam with routine gynecological exam - Cytology - PAP( [place])  2. Vulvovaginitis - WET PREP FOR TRICH, YEAST, CLUE - nystatin-triamcinolone ointment (MYCOLOG); Apply 1 Application topically 2 (two) times daily.  Dispense: 30 g; Refill: 0  3. Dysplasia of cervix, low grade (CIN 1) - Cytology - PAP( [place])   Discussed SBE, colonoscopy and DEXA screening as directed. Recommend of exercise weekly, including weight bearing exercise. Encouraged the use of seatbelts and sunscreen.  Return in 1 year for annual or sooner prn.  Arlie Solomons B WHNP-BC, 4:07 PM 05/11/2023

## 2023-05-16 LAB — CYTOLOGY - PAP
Comment: NEGATIVE
Diagnosis: NEGATIVE
High risk HPV: NEGATIVE

## 2023-05-20 ENCOUNTER — Ambulatory Visit: Payer: 59 | Attending: Cardiology | Admitting: Cardiology

## 2023-05-20 ENCOUNTER — Encounter: Payer: Self-pay | Admitting: Cardiology

## 2023-05-20 VITALS — BP 108/73 | HR 79 | Ht 68.0 in | Wt 250.0 lb

## 2023-05-20 DIAGNOSIS — I10 Essential (primary) hypertension: Secondary | ICD-10-CM | POA: Diagnosis not present

## 2023-05-20 DIAGNOSIS — E785 Hyperlipidemia, unspecified: Secondary | ICD-10-CM

## 2023-05-20 DIAGNOSIS — I4891 Unspecified atrial fibrillation: Secondary | ICD-10-CM

## 2023-05-20 DIAGNOSIS — I480 Paroxysmal atrial fibrillation: Secondary | ICD-10-CM

## 2023-05-20 DIAGNOSIS — E1169 Type 2 diabetes mellitus with other specified complication: Secondary | ICD-10-CM

## 2023-05-20 DIAGNOSIS — G4733 Obstructive sleep apnea (adult) (pediatric): Secondary | ICD-10-CM | POA: Diagnosis not present

## 2023-05-20 DIAGNOSIS — D6869 Other thrombophilia: Secondary | ICD-10-CM

## 2023-05-20 MED ORDER — DILTIAZEM HCL ER COATED BEADS 240 MG PO CP24: 240.0000 mg | ORAL_CAPSULE | Freq: Every day | ORAL | 3 refills | Status: DC

## 2023-05-20 MED ORDER — APIXABAN 5 MG PO TABS: 5.0000 mg | ORAL_TABLET | Freq: Two times a day (BID) | ORAL | 5 refills | Status: DC

## 2023-05-20 MED ORDER — FLECAINIDE ACETATE 100 MG PO TABS: 100.0000 mg | ORAL_TABLET | Freq: Two times a day (BID) | ORAL | 6 refills | Status: DC | PRN

## 2023-05-20 MED ORDER — DILTIAZEM HCL 60 MG PO TABS: ORAL_TABLET | ORAL | 6 refills | Status: DC

## 2023-05-20 NOTE — Progress Notes (Unsigned)
Cardiology Office Note:  .   Date:  05/24/2023  ID:  Julia Dickerson, DOB 1975/12/03, MRN [id number] PCP: Deeann Saint, MD  [facility] Providers Cardiologist:  Bryan Lemma, MD     Chief Complaint  Patient presents with   Follow-up   Atrial Fibrillation    No breakthrough spells    Patient Profile: .     Julia Dickerson is a pleasant, borderline morbidly obese 47 y.o. female  with a PMH notable for PAF (2017), DM 2, HTN, HLD and OSA who presents here for 2-year follow-up at the request of Deeann Saint, MD.    Julia Dickerson was last seen on 11/27/2020 as a 8-month follow-up after being seen in A-fib clinic after breakthrough spell of A-fib.  This occurred while she was suffering from COVID.  At that point had not had any further breakthrough spells where she had to use her breakthrough flecainide.  Noted mild dizziness. => Converted after diltiazem CD to 240 mg daily.  We had PRN 60 mg short acting diltiazem as well as flecainide 100 mg for breakthrough A-fib.  Subjective  Discussed the use of AI scribe software for clinical note transcription with the patient, who gave verbal consent to proceed.  History of Present Illness   The patient, a 47 year old individual with a history of atrial fibrillation, diabetes, and hypertension, presents for a routine follow-up after a gap of over two years. She reports no breakthrough spells of atrial fibrillation during this period and has not required the use of Flecainide. The patient's endocrinologist has initiated her on Jardiance and Mounjaro for diabetes management, which also have cardiovascular benefits. The patient has not been taking Eliquis for a while, but reports no adverse events related to this.  The patient denies experiencing any chest pain, pressure, tightness, dizziness, fainting, leg swelling, shortness of breath, or stroke symptoms. She also denies any recent illnesses, fevers, chills, colds, or sweats. The  patient's blood pressure is well-controlled on Lisinopril and Diltiazem, and she has not required as-needed medication.  The patient's cholesterol levels and LDL are being monitored by her primary care provider, and she is currently on a 40mg  dose of a statin. The patient's activity level is reported as not very high, which may be influenced by personal stressors, including a family member's health issues.  In summary, the patient's atrial fibrillation has been well-controlled without the need for breakthrough medication, and her diabetes and hypertension are being managed effectively. The patient's lifestyle and stress levels may benefit from further attention.     Cardiovascular ROS: no chest pain or dyspnea on exertion negative for - edema, irregular heartbeat, orthopnea, palpitations, paroxysmal nocturnal dyspnea, rapid heart rate, shortness of breath, or syncope or near-syncope, TIA RRR severe AS, claudication  ROS:  Review of Systems - Negative except symptoms noted above   Objective   Studies Reviewed: [name]   EKG Interpretation Date/Time:  Friday May 20 2023 17:39:07 EDT Ventricular Rate:  79 PR Interval:  158 QRS Duration:  90 QT Interval:  394 QTC Calculation: 451 R Axis:   49  Text Interpretation: Normal sinus rhythm Septal infarct (cited on or before 31-Jul-2020) When compared with ECG of 31-Jul-2020 08:29, No significant change was found Confirmed by Bryan Lemma ([id number]) on 05/20/2023 6:08:30 PM    No studies Labs from 08/27/2022: TC 154, TG 97, HDL 43, LDL 91.  A1c 6.9.  Hgb 15. Cr 0.64, K+ 4.1, TSH 0.93  Risk Assessment/Calculations:  CHA2DS2-VASc Score = 3   This indicates a 3.2% annual risk of stroke. The patient's score is based upon: CHF History: 0 HTN History: 1 Diabetes History: 1 Stroke History: 0 Vascular Disease History: 0 Age Score: 0 Gender Score: 1          Physical Exam:   VS:  BP 108/73   Pulse 79   Ht 5\' 8"  (1.727 m)   Wt 250 lb (113.4  kg)   LMP 11/23/2017   SpO2 96%   BMI 38.01 kg/m    Wt Readings from Last 3 Encounters:  05/20/23 250 lb (113.4 kg)  05/11/23 247 lb (112 kg)  10/08/22 262 lb (118.8 kg)    GEN: Well nourished, well developed in no acute distress; borderline morbidly obese but otherwise healthy-appearing NECK: No JVD; No carotid bruits CARDIAC: Normal S1, S2; RRR, no murmurs, rubs, gallops RESPIRATORY:  Clear to auscultation without rales, wheezing or rhonchi ; nonlabored, good air movement. ABDOMEN: Soft, non-tender, non-distended EXTREMITIES:  No edema; No deformity     ASSESSMENT AND PLAN: .    Problem List Items Addressed This Visit       Cardiology Problems   Essential hypertension (Chronic)    Blood pressure well controlled on Lisinopril 20mg , diltiazem CD2 140 mg daily. -Continue Lisinopril and diltiazem at current doses..      Relevant Medications   apixaban (ELIQUIS) 5 MG TABS tablet   diltiazem (CARDIZEM) 60 MG tablet   diltiazem (CARDIZEM CD) 240 MG 24 hr capsule   flecainide (TAMBOCOR) 100 MG tablet   Other Relevant Orders   EKG 12-Lead (Completed)   Hyperlipidemia associated with type 2 diabetes mellitus (HCC) (Chronic)    Relatively decent lipid control with an LDL of 91 on current dose of rosuvastatin 40 mg daily.  Continue to monitor with PCP.  Patient on Jardiance and [facility], managed by endocrinologist. -No changes, continue current management.  Labs followed by PCP      Relevant Medications   apixaban (ELIQUIS) 5 MG TABS tablet   diltiazem (CARDIZEM) 60 MG tablet   diltiazem (CARDIZEM CD) 240 MG 24 hr capsule   flecainide (TAMBOCOR) 100 MG tablet   Paroxysmal atrial fibrillation (HCC): CHA2DS2Vasc ~3 - Primary (Chronic)    No breakthrough spells of Afib for over three years. Patient is aware of symptoms when in Afib. Eliquis has not been taken for several months without any adverse events. => As such, we probably will just use DOAC as PRN dosing. -Refill Eliquis  for PRN use, given breakthrough spells..  -> Instructed to take Eliquis for a month if an Afib episode occurs.      Relevant Medications   apixaban (ELIQUIS) 5 MG TABS tablet   diltiazem (CARDIZEM) 60 MG tablet   diltiazem (CARDIZEM CD) 240 MG 24 hr capsule   flecainide (TAMBOCOR) 100 MG tablet   Other Relevant Orders   EKG 12-Lead (Completed)     Other   OSA (obstructive sleep apnea) (Chronic)    Continue to recommend CPAP use      Secondary hypercoagulable state (HCC) (Chronic)    Very infrequent episodes of A-fib as such, we will simply go to a PRN usage of DOAC.  When she has a breakthrough spell of A-fib, she will use her diltiazem and flecainide and then will take DOAC for 1 month.             Dispo: Return in about 1 year (around 05/19/2024) for 1 Yr Follow-up.Follow-up in 1 year.  Total time spent: 18 min spent with patient + 13 min spent charting = 31 min      Signed, Marykay Lex, MD, MS Bryan Lemma, M.D., M.S. Interventional Cardiologist  [facility]  Pager # [phone] Phone # [phone] [address] [place], [place] [address]

## 2023-05-20 NOTE — Patient Instructions (Addendum)
Medication Instructions:   Eliquis  5 mg twice a day if you have an episode for break through afib - take for one month along with flecainide  Sent to [employer] mail order as requested  *If you need a refill on your cardiac medications before your next appointment, please call your pharmacy*   Lab Work: Not needed    Testing/Procedures:  Not needed  Follow-Up: At [facility], you and your health needs are our priority.  As part of our continuing mission to provide you with exceptional heart care, we have created designated Provider Care Teams.  These Care Teams include your primary Cardiologist (physician) and Advanced Practice Providers (APPs -  Physician Assistants and Nurse Practitioners) who all work together to provide you with the care you need, when you need it.     Your next appointment:   12 month(s)  The format for your next appointment:   In Person  Provider:   Bryan Lemma, MD

## 2023-05-24 ENCOUNTER — Encounter: Payer: Self-pay | Admitting: Cardiology

## 2023-05-24 NOTE — Assessment & Plan Note (Signed)
Blood pressure well controlled on Lisinopril 20mg , diltiazem CD2 140 mg daily. -Continue Lisinopril and diltiazem at current doses.[name]

## 2023-05-24 NOTE — Assessment & Plan Note (Signed)
Very infrequent episodes of A-fib as such, we will simply go to a PRN usage of DOAC.  When she has a breakthrough spell of A-fib, she will use her diltiazem and flecainide and then will take DOAC for 1 month.

## 2023-05-24 NOTE — Assessment & Plan Note (Addendum)
Relatively decent lipid control with an LDL of 91 on current dose of rosuvastatin 40 mg daily.  Continue to monitor with PCP.  Patient on Jardiance and [facility], managed by endocrinologist. -No changes, continue current management.  Labs followed by PCP

## 2023-05-24 NOTE — Assessment & Plan Note (Signed)
Continue to recommend CPAP use. ?

## 2023-05-24 NOTE — Assessment & Plan Note (Signed)
No breakthrough spells of Afib for over three years. Patient is aware of symptoms when in Afib. Eliquis has not been taken for several months without any adverse events. => As such, we probably will just use DOAC as PRN dosing. -Refill Eliquis for PRN use, given breakthrough spells..  -> Instructed to take Eliquis for a month if an Afib episode occurs.

## 2023-06-06 ENCOUNTER — Ambulatory Visit: Payer: 59 | Admitting: Podiatry

## 2023-06-06 ENCOUNTER — Encounter: Payer: Self-pay | Admitting: Podiatry

## 2023-06-06 ENCOUNTER — Ambulatory Visit (INDEPENDENT_AMBULATORY_CARE_PROVIDER_SITE_OTHER): Payer: 59

## 2023-06-06 DIAGNOSIS — M722 Plantar fascial fibromatosis: Secondary | ICD-10-CM

## 2023-06-06 MED ORDER — DEXAMETHASONE SODIUM PHOSPHATE 120 MG/30ML IJ SOLN
4.0000 mg | Freq: Once | INTRAMUSCULAR | Status: AC
Start: 2023-06-06 — End: 2023-06-06
  Administered 2023-06-06: 4 mg via INTRA_ARTICULAR

## 2023-06-06 MED ORDER — TRIAMCINOLONE ACETONIDE 10 MG/ML IJ SUSP
2.5000 mg | Freq: Once | INTRAMUSCULAR | Status: AC
  Administered 2023-06-06: 2.5 mg via INTRA_ARTICULAR

## 2023-06-06 MED ORDER — MELOXICAM 15 MG PO TABS: 15.0000 mg | ORAL_TABLET | Freq: Every day | ORAL | 0 refills | Status: DC

## 2023-06-06 NOTE — Patient Instructions (Signed)

## 2023-06-06 NOTE — Progress Notes (Signed)
  Subjective:  Patient ID: Julia Dickerson, female    DOB: 1976-04-20,   MRN: [id number]  Chief Complaint  Patient presents with   Foot Pain    Plantar heel x few weeks, AM pain, little more tolerable to walk now, been taking Ibuprofen, would like an injection   New Patient (Initial Visit)    Est pt 2022    47 y.o. female presents for concern as above. History reviewed . Denies any other pedal complaints. Denies n/v/f/c.   Past Medical History:  Diagnosis Date   Anxiety    Arthritis    Atrial fibrillation (HCC)    a. paroxysmal, uses PRN Flecainide   Depression    Diabetes mellitus without complication (HCC)    Dysrhythmia    GERD (gastroesophageal reflux disease)    Hypertension    Obesity    PCOS (polycystic ovarian syndrome)    UTI (lower urinary tract infection)     Objective:  Physical Exam: Vascular: DP/PT pulses 2/4 bilateral. CFT <3 seconds. Normal hair growth on digits. No edema.  Skin. No lacerations or abrasions bilateral feet.  Musculoskeletal: MMT 5/5 bilateral lower extremities in DF, PF, Inversion and Eversion. Deceased ROM in DF of ankle joint. Tender to the medial calcaneal tubercle left . No pain with achilles, PT or arch. No pain with calcaneal squeeze.  Neurological: Sensation intact to light touch.   Assessment:   1. Plantar fasciitis of left foot      Plan:  Patient was evaluated and treated and all questions answered. Discussed plantar fasciitis with patient.  X-rays reviewed and discussed with patient. No acute fractures or dislocations noted. Mild spurring noted at inferior calcaneus.  Discussed treatment options including, ice, NSAIDS, supportive shoes, bracing, and stretching. Stretching exercises provided to be done on a daily basis.   Prescription for meloxicam provided and sent to pharmacy. Patient requesting injection today. Procedure note below.  PF brace disepsned.   Follow-up 6 weeks or sooner if any problems arise. In the meantime,  encouraged to call the office with any questions, concerns, change in symptoms.   Procedure:  Discussed etiology, pathology, conservative vs. surgical therapies. At this time a plantar fascial injection was recommended.  The patient agreed and a sterile skin prep was applied.  An injection consisting of  1cc dexamethasone 0.5 cc kenalog and 1cc marcaine mixture was infiltrated at the point of maximal tenderness on the left Heel.  Bandaid applied. The patient tolerated this well and was given instructions for aftercare.    Louann Sjogren, DPM

## 2023-06-09 ENCOUNTER — Encounter: Payer: Self-pay | Admitting: Podiatry

## 2023-06-27 ENCOUNTER — Other Ambulatory Visit: Payer: Self-pay | Admitting: Podiatry

## 2023-07-25 ENCOUNTER — Encounter: Payer: Self-pay | Admitting: Podiatry

## 2023-07-25 ENCOUNTER — Ambulatory Visit: Payer: 59 | Admitting: Podiatry

## 2023-07-25 DIAGNOSIS — M722 Plantar fascial fibromatosis: Secondary | ICD-10-CM | POA: Diagnosis not present

## 2023-07-25 NOTE — Progress Notes (Signed)
  Subjective:  Patient ID: Julia Dickerson, female    DOB: 11/28/1975,   MRN: [id number]  Chief Complaint  Patient presents with   Plantar Fasciitis    47 y.o. female presents for follow-up of left plantar fasciits. Relates doing about 90% better. Has been wearing brace and took the meloxicam. Denies any other pedal complaints. Denies n/v/f/c.   Past Medical History:  Diagnosis Date   Anxiety    Arthritis    Atrial fibrillation (HCC)    a. paroxysmal, uses PRN Flecainide   Depression    Diabetes mellitus without complication (HCC)    Dysrhythmia    GERD (gastroesophageal reflux disease)    Hypertension    Obesity    PCOS (polycystic ovarian syndrome)    UTI (lower urinary tract infection)     Objective:  Physical Exam: Vascular: DP/PT pulses 2/4 bilateral. CFT <3 seconds. Normal hair growth on digits. No edema.  Skin. No lacerations or abrasions bilateral feet.  Musculoskeletal: MMT 5/5 bilateral lower extremities in DF, PF, Inversion and Eversion. Deceased ROM in DF of ankle joint. Minimally tender to the medial calcaneal tubercle left . No pain with achilles, PT or arch. No pain with calcaneal squeeze.  Neurological: Sensation intact to light touch.   Assessment:   1. Plantar fasciitis of left foot      Plan:  Patient was evaluated and treated and all questions answered. Discussed plantar fasciitis with patient.  X-rays reviewed and discussed with patient. No acute fractures or dislocations noted. Mild spurring noted at inferior calcaneus.  Discussed treatment options including, ice, NSAIDS, supportive shoes, bracing, and stretching.  Continue stretching and anti-inflammatories as needed  Discussed CMO  Follow-up as needed   Louann Sjogren, DPM

## 2023-08-05 ENCOUNTER — Other Ambulatory Visit: Payer: Self-pay | Admitting: Family Medicine

## 2023-08-05 DIAGNOSIS — I10 Essential (primary) hypertension: Secondary | ICD-10-CM

## 2023-08-12 ENCOUNTER — Ambulatory Visit: Payer: 59 | Admitting: Internal Medicine

## 2023-08-12 ENCOUNTER — Encounter: Payer: Self-pay | Admitting: Internal Medicine

## 2023-08-12 ENCOUNTER — Ambulatory Visit: Payer: 59 | Admitting: Family Medicine

## 2023-08-12 ENCOUNTER — Encounter: Payer: Self-pay | Admitting: Family Medicine

## 2023-08-12 VITALS — BP 112/70 | HR 77 | Ht 68.0 in | Wt 254.0 lb

## 2023-08-12 VITALS — BP 106/74 | HR 81 | Temp 98.3°F | Ht 68.0 in | Wt 257.8 lb

## 2023-08-12 DIAGNOSIS — E785 Hyperlipidemia, unspecified: Secondary | ICD-10-CM

## 2023-08-12 DIAGNOSIS — E7800 Pure hypercholesterolemia, unspecified: Secondary | ICD-10-CM | POA: Diagnosis not present

## 2023-08-12 DIAGNOSIS — Z794 Long term (current) use of insulin: Secondary | ICD-10-CM | POA: Diagnosis not present

## 2023-08-12 DIAGNOSIS — M1990 Unspecified osteoarthritis, unspecified site: Secondary | ICD-10-CM

## 2023-08-12 DIAGNOSIS — E1140 Type 2 diabetes mellitus with diabetic neuropathy, unspecified: Secondary | ICD-10-CM | POA: Diagnosis not present

## 2023-08-12 DIAGNOSIS — Z7984 Long term (current) use of oral hypoglycemic drugs: Secondary | ICD-10-CM

## 2023-08-12 LAB — POCT GLYCOSYLATED HEMOGLOBIN (HGB A1C): Hemoglobin A1C: 6.5 % — AB (ref 4.0–5.6)

## 2023-08-12 MED ORDER — BD PEN NEEDLE NANO U/F 32G X 4 MM MISC: 1.0000 | Freq: Two times a day (BID) | 3 refills | Status: DC

## 2023-08-12 MED ORDER — DEXCOM G7 SENSOR MISC: 3 refills | Status: DC

## 2023-08-12 MED ORDER — ROSUVASTATIN CALCIUM 40 MG PO TABS: 40.0000 mg | ORAL_TABLET | Freq: Every day | ORAL | 3 refills | Status: DC

## 2023-08-12 MED ORDER — MELOXICAM 15 MG PO TABS
15.0000 mg | ORAL_TABLET | Freq: Every day | ORAL | 3 refills | Status: DC
Start: 2023-08-12 — End: 2023-11-07

## 2023-08-12 MED ORDER — METFORMIN HCL ER 500 MG PO TB24: 500.0000 mg | ORAL_TABLET | Freq: Every day | ORAL | 3 refills | Status: DC

## 2023-08-12 MED ORDER — EMPAGLIFLOZIN 25 MG PO TABS: 25.0000 mg | ORAL_TABLET | Freq: Every day | ORAL | 3 refills | Status: DC

## 2023-08-12 MED ORDER — INSULIN LISPRO PROT & LISPRO (75-25 MIX) 100 UNIT/ML KWIKPEN: PEN_INJECTOR | SUBCUTANEOUS | 3 refills | Status: DC

## 2023-08-12 NOTE — Patient Instructions (Signed)
-   Stop Mounjaro - Continue Metformin 500 mg, 1 tablet daily  - Continue Humalog  Mix 60 units with Breakfast and 50 units with Supper  - Continue Jardiance 25  mg, 1 tablet daily with breakfast     - HOW TO TREAT LOW BLOOD SUGARS (Blood sugar LESS THAN 70 MG/DL) Please follow the RULE OF 15 for the treatment of hypoglycemia treatment (when your (blood sugars are less than 70 mg/dL)   STEP 1: Take 15 grams of carbohydrates when your blood sugar is low, which includes:  3-4 GLUCOSE TABS  OR 3-4 OZ OF JUICE OR REGULAR SODA OR ONE TUBE OF GLUCOSE GEL    STEP 2: RECHECK blood sugar in 15 MINUTES STEP 3: If your blood sugar is still low at the 15 minute recheck --> then, go back to STEP 1 and treat AGAIN with another 15 grams of carbohydrates.

## 2023-08-12 NOTE — Progress Notes (Signed)
Name: [name]  Age/ Sex: 48 y.o., female   MRN/ DOB: [id number], January 09, 1976     PCP: Deeann Saint, MD   Reason for Endocrinology Evaluation: Type 2 diabetes Mellitus  Initial Endocrine Consultative Visit:  09/05/2018    PATIENT IDENTIFIER: Julia Dickerson is a 48 y.o. female with a past medical history of PCOS, T2DM, PAF and HTN.  The patient has followed with Endocrinology clinic since 09/05/2018 for consultative assistance with management of her diabetes.  DIABETIC HISTORY:  [name] was diagnosed with T2DM since the 2000's.  She was initially on metformin, she was switched to MDI insulin regimen which helped control her hyperglycemia at the time, the patient was subsequently switched to oral glycemic agents in 2012 due to improved glucose control.  She was restarted on basal insulin in 2019 due to persistent hyperglycemia, she has tried Victoza in the past with severe nausea. Her hemoglobin A1c has ranged from 8.7% in in 2019, peaking at 10.7% in 08/14/2018.   Prandial insulin started in February 2020, in addition to continuing her metformin and basal insulin. Rybelsus is cost prohibitive   By 05/2019 we switched her MDI regimen to insulin mix, we started Trulicity, and continued metformin.  Trulicity stopped 1/2/021 due to GI side effects  Jardiance started 07/2020  [name] 05/2022  SUBJECTIVE:   During the last visit (10/08/2022): A1c 6.9 %.     Today (08/12/2023): [name] is here for a follow up on diabetes management. She checks glucose multiple x a day, she has not had any  hypoglycemic episodes since her last clinic visit.   She was seen by podiatry for plantar fasciitis 06/2023 She had a follow-up with cardiology 04/2023 for A-fib, HTN, and dyslipidemia She has changes her She continues with bloating with sulfur burps with Mounjaro    CONTINUOUS GLUCOSE MONITORING RECORD INTERPRETATION    Dates of Recording: 08/12/2023  Sensor  description:dexcom   Results statistics:   CGM use % of time 94%  Average and SD 197/56  Time in range 44%  % Time Above 180 37  % Time above 250 19  % Time Below target 0     Glycemic patterns summary:Bg's trend down at night and fluctuate during the day   Hyperglycemic episodes postprandial  Hypoglycemic episodes occurred N/A  Overnight periods: trend down     HOME DIABETES REGIMEN:  NovoLog mix 60 units with Breakfast and 50 units with Supper  Metformin 500 mg XR 1 tab daily  Jardiance 25 mg daily  Mounjaro 2.5 mg once weekly Rosuvastatin 40 mg daily    DIABETIC COMPLICATIONS: Microvascular complications:  Neuropathy  Denies: CKD, retinopathy  Last eye exam: Completed 11/2021   Macrovascular complications:    Denies: CAD, PVD, CVA     HISTORY:  Past Medical History:  Past Medical History:  Diagnosis Date   Anxiety    Arthritis    Atrial fibrillation (HCC)    a. paroxysmal, uses PRN Flecainide   Depression    Diabetes mellitus without complication (HCC)    Dysrhythmia    GERD (gastroesophageal reflux disease)    Hypertension    Obesity    PCOS (polycystic ovarian syndrome)    UTI (lower urinary tract infection)    Past Surgical History:  Past Surgical History:  Procedure Laterality Date   DILATION AND CURETTAGE OF UTERUS  2011 and 2012   x2   GXT - Graded Exercise Tolerance Test  07/22/2016   Exercise  time 6 minutes - 7 METs. Maximum heart rate 146 BPM. - No EKG changes. No arrhythmias or chest pain.: Low risk test.   TRANSTHORACIC ECHOCARDIOGRAM  06/2016   EF 6570% with mild LVH. Normal wall motion and diastolic function. Mild RAE. Otherwise normal.   WISDOM TOOTH EXTRACTION     Social History:  reports that she quit smoking about 4 years ago. Her smoking use included cigarettes and e-cigarettes. She started smoking about 25 years ago. She has a 10.5 pack-year smoking history. She has been exposed to tobacco smoke. She has never used smokeless  tobacco. She reports that she does not currently use alcohol. She reports that she does not use drugs. Family History:  Family History  Problem Relation Age of Onset   Arthritis Mother    Diabetes Mother    Kidney disease Mother    Heart disease Mother    Heart failure Father    Heart attack Father    Cancer Father    Diabetes Father    Hearing loss Father    Hyperlipidemia Father    Hypertension Father    Arthritis Sister    COPD Maternal Grandmother    Heart disease Maternal Grandmother    Hyperlipidemia Maternal Grandmother    Hyperlipidemia Maternal Grandfather    Hypertension Maternal Grandfather    Hearing loss Maternal Grandfather    Alcohol abuse Maternal Grandfather    Arthritis Maternal Grandfather    Stroke Maternal Grandfather    Hyperlipidemia Paternal Grandmother    Miscarriages / Stillbirths Paternal Grandmother    Kidney disease Paternal Grandfather    Hypertension Paternal Grandfather    Heart attack Paternal Grandfather    Diabetes Paternal Grandfather    Breast cancer Neg Hx      HOME MEDICATIONS: Allergies as of 08/12/2023       Reactions   Sulfa Antibiotics Hives        Medication List        Accurate as of August 12, 2023 11:54 AM. If you have any questions, ask your nurse or doctor.          apixaban 5 MG Tabs tablet Commonly known as: Eliquis Take 1 tablet (5 mg total) by mouth 2 (two) times daily. As directed( only use for break through afib)   BD Pen Needle Nano U/F 32G X 4 MM Misc Generic drug: Insulin Pen Needle 1 Device by Other route in the morning and at bedtime. use as directed   Dexcom G7 Sensor Misc Use as instructed to monitor blood sugar, change every 10 days.   diltiazem 240 MG 24 hr capsule Commonly known as: CARDIZEM CD Take 1 capsule (240 mg total) by mouth daily.   diltiazem 60 MG tablet Commonly known as: CARDIZEM May take 60 mg  by mouth every 6 hours as needed up to 3 dose a day for breakthrough along  with using Flecainide 100 mg.   empagliflozin 25 MG Tabs tablet Commonly known as: Jardiance Take 1 tablet (25 mg total) by mouth daily before breakfast.   flecainide 100 MG tablet Commonly known as: TAMBOCOR Take 1 tablet (100 mg total) by mouth 2 (two) times daily as needed. For breakthrough afib   fluticasone 50 MCG/ACT nasal spray Commonly known as: FLONASE USE 1 SPRAY IN BOTH NOSTRILS  DAILY   GAS-X PO Take by mouth in the morning and at bedtime.   Insulin Lispro Prot & Lispro (75-25) 100 UNIT/ML Kwikpen Commonly known as: HumaLOG Mix 75/25 KwikPen Inject  56 Units into the skin daily with breakfast AND 46 Units daily with supper.   lisinopril 20 MG tablet Commonly known as: ZESTRIL TAKE 1 TABLET BY MOUTH DAILY   meloxicam 15 MG tablet Commonly known as: MOBIC TAKE 1 TABLET (15 MG TOTAL) BY MOUTH DAILY.   metFORMIN 500 MG 24 hr tablet Commonly known as: GLUCOPHAGE-XR Take 1 tablet (500 mg total) by mouth daily with breakfast.   multivitamin with minerals Tabs tablet Take 1 tablet by mouth daily.   nystatin-triamcinolone ointment Commonly known as: MYCOLOG Apply 1 Application topically 2 (two) times daily.   omeprazole 20 MG capsule Commonly known as: PRILOSEC Take 20 mg by mouth every evening.   rosuvastatin 40 MG tablet Commonly known as: CRESTOR Take 1 tablet (40 mg total) by mouth daily.   tirzepatide 2.5 MG/0.5ML Pen Commonly known as: MOUNJARO Inject 2.5 mg into the skin once a week.         OBJECTIVE:   Vital Signs: BP 112/70 (BP Location: Left Arm, Patient Position: Sitting, Cuff Size: Normal)   Pulse 77   Ht 5\' 8"  (1.727 m)   Wt 254 lb (115.2 kg)   LMP 11/23/2017   SpO2 95%   BMI 38.62 kg/m   Wt Readings from Last 3 Encounters:  08/12/23 254 lb (115.2 kg)  05/20/23 250 lb (113.4 kg)  05/11/23 247 lb (112 kg)     Exam: General: Pt appears well and is in NAD  Lungs: Clear with good BS bilat   Heart: RRR   Extremities: No  pretibial edema.   Neuro: MS is good with appropriate affect, pt is alert and Ox3        DM foot exam: 07/25/2023 per podiatry     DATA REVIEWED:  Lab Results  Component Value Date   HGBA1C 6.5 (A) 08/12/2023   HGBA1C 6.9 (H) 08/27/2022   HGBA1C 6.8 (A) 06/03/2022    Latest Reference Range & Units 08/27/22 11:50  Sodium 135 - 145 mEq/L 138  Potassium 3.5 - 5.1 mEq/L 4.1  Chloride 96 - 112 mEq/L 102  CO2 19 - 32 mEq/L 26  Glucose 70 - 99 mg/dL 161 (H)  BUN 6 - 23 mg/dL 12  Creatinine 0.96 - 0.45 mg/dL 4.09  Calcium 8.4 - 81.1 mg/dL 9.5  Alkaline Phosphatase 39 - 117 U/L 62  Albumin 3.5 - 5.2 g/dL 4.4  AST 0 - 37 U/L 16  ALT 0 - 35 U/L 24  Total Protein 6.0 - 8.3 g/dL 7.6  Total Bilirubin 0.2 - 1.2 mg/dL 0.5  GFR >91.47 mL/min 106.25    Latest Reference Range & Units 08/27/22 11:50  Total CHOL/HDL Ratio  4  Cholesterol 0 - 200 mg/dL 829  HDL Cholesterol >56.21 mg/dL 30.86  LDL (calc) 0 - 99 mg/dL 91  NonHDL  578.46  Triglycerides 0.0 - 149.0 mg/dL 96.2  VLDL 0.0 - 95.2 mg/dL 84.1    Latest Reference Range & Units 08/27/22 11:50  Hemoglobin A1C 4.6 - 6.5 % 6.9 (H)  TSH 0.35 - 5.50 uIU/mL 0.93  T4,Free(Direct) 0.60 - 1.60 ng/dL 3.24  (H): Data is abnormally high   ASSESSMENT / PLAN / RECOMMENDATIONS:   1) Type 2 Diabetes Mellitus,optimally controlled , With Neuropathic Complications - Most recent A1c of 6.5 %. Goal A1c < 7.0 %.    -Praised the patient on improved glycemic control -She is intolerant to Trulicity and higher doses of metformin -She is not tolerating Mounjaro 2.5 mg due to bloating and  sulfur taste, we opted to discontinue -No changes -Patient will have labs through her PCPs office today   MEDICATIONS: - Continue Metformin 500 mg, 1 tablet daily  - Change Humalog Mix 60 units with Breakfast  and 50 units with Supper  - Continue Jardiance 25 mg, 1 tablet daily with breakfast  - Discontinue  Mounjaro     EDUCATION / INSTRUCTIONS: BG  monitoring instructions: Patient is instructed to check her blood sugars 4 times a day, fasting and bedtime. Call [facility] if: BG persistently < 70 . I reviewed the Rule of 15 for the treatment of hypoglycemia in detail with the patient. Literature supplied.    2) Diabetic complications:  Eye: Does not have known diabetic retinopathy.  Neuro/ Feet: Does have known diabetic peripheral neuropathy. Renal: Patient does not have known baseline CKD. She is on an ACEI/ARB at present.    3) Dyslipidemia :  -Her LDL and Tg acceptable in the past -Per patient she will have labs today at PCPs office  Medication   Continue rosuvastatin 40 mg daily   F/U in 6 months   Signed electronically by: Lyndle Herrlich, MD  [facility]  [facility] [address] [place], [place] [address] Phone: [phone] FAX: [phone]   CC: Deeann Saint, MD [address] [place] [address] Phone: [phone]  Fax: [phone]  Return to Endocrinology clinic as below: Future Appointments  Date Time Provider Department Center  08/12/2023  3:30 PM Deeann Saint, MD [id number]

## 2023-08-12 NOTE — Progress Notes (Signed)
Established Patient Office Visit   Subjective  Patient ID: Julia Dickerson, female    DOB: Dec 14, 1975  Age: 48 y.o. MRN: [id number]  Chief Complaint  Patient presents with   Medical Management of Chronic Issues    Medication follow-up , patient is concerned about high cholesterol     Patient is a 48 year old female seen for follow-up.  Patient states her cholesterol was elevated during an insurance check at her job.  Patient states she was not fasting at the time.  Cholesterol previously normal at last CPE 08/2022.  Patient endorses bilateral feet feeling like they have socks on them at all times.  Patient states she was given a trial of Mobic by podiatry for plantar fasciitis which seems to help the sensation and decreased the pain in her feet and hands from arthritis.  Patient also notes having to consciously think to pick up her feet when walking to avoid tripping.  Has fallen 2 times while out running errands.  Now unable to kneel on knees without pain.   Patient Active Problem List   Diagnosis Date Noted   Secondary hypercoagulable state (HCC) 07/31/2020   A-fib (HCC) 07/28/2020   Type 2 diabetes mellitus with hyperglycemia, with long-term current use of insulin (HCC) 02/11/2020   Hypertriglyceridemia 06/15/2019   OSA (obstructive sleep apnea) 10/05/2018   Weight loss 10/05/2018   Former smoker 08/07/2018   PCOS (polycystic ovarian syndrome) 08/03/2018   Chronic diarrhea 09/19/2017   Neuropathic pain 09/19/2017   Type 2 diabetes mellitus with diabetic neuropathy, unspecified (HCC) 08/19/2017   Hyperlipidemia associated with type 2 diabetes mellitus (HCC) 08/19/2017   Current use of long term anticoagulation 05/31/2017   Atrial fibrillation with RVR (HCC)    Abnormal EKG 06/24/2016   Paroxysmal atrial fibrillation (HCC): CHA2DS2Vasc ~3 06/23/2016   Essential hypertension 06/23/2016   Major depression, recurrent (HCC) 07/10/2014   Past Medical History:  Diagnosis Date    Anxiety    Arthritis    Atrial fibrillation (HCC)    a. paroxysmal, uses PRN Flecainide   Depression    Diabetes mellitus without complication (HCC)    Dysrhythmia    GERD (gastroesophageal reflux disease)    Hypertension    Obesity    PCOS (polycystic ovarian syndrome)    UTI (lower urinary tract infection)    Past Surgical History:  Procedure Laterality Date   DILATION AND CURETTAGE OF UTERUS  2011 and 2012   x2   GXT - Graded Exercise Tolerance Test  07/22/2016   Exercise time 6 minutes - 7 METs. Maximum heart rate 146 BPM. - No EKG changes. No arrhythmias or chest pain.: Low risk test.   TRANSTHORACIC ECHOCARDIOGRAM  06/2016   EF 6570% with mild LVH. Normal wall motion and diastolic function. Mild RAE. Otherwise normal.   WISDOM TOOTH EXTRACTION     Social History   Tobacco Use   Smoking status: Former    Current packs/day: 0.00    Average packs/day: 0.5 packs/day for 21.0 years (10.5 ttl pk-yrs)    Types: Cigarettes, E-cigarettes    Start date: 08/24/1997    Quit date: 08/24/2018    Years since quitting: 4.9    Passive exposure: Past   Smokeless tobacco: Never   Tobacco comments:    Using nicotine patches  Vaping Use   Vaping status: Never Used  Substance Use Topics   Alcohol use: Not Currently   Drug use: No   Family History  Problem Relation Age of Onset  Arthritis Mother    Diabetes Mother    Kidney disease Mother    Heart disease Mother    Heart failure Father    Heart attack Father    Cancer Father    Diabetes Father    Hearing loss Father    Hyperlipidemia Father    Hypertension Father    Arthritis Sister    COPD Maternal Grandmother    Heart disease Maternal Grandmother    Hyperlipidemia Maternal Grandmother    Hyperlipidemia Maternal Grandfather    Hypertension Maternal Grandfather    Hearing loss Maternal Grandfather    Alcohol abuse Maternal Grandfather    Arthritis Maternal Grandfather    Stroke Maternal Grandfather    Hyperlipidemia  Paternal Grandmother    Miscarriages / Stillbirths Paternal Grandmother    Kidney disease Paternal Grandfather    Hypertension Paternal Grandfather    Heart attack Paternal Grandfather    Diabetes Paternal Grandfather    Breast cancer Neg Hx    Allergies  Allergen Reactions   Sulfa Antibiotics Hives      ROS Negative unless stated above    Objective:     BP 106/74 (BP Location: Left Arm, Patient Position: Sitting, Cuff Size: Large)   Pulse 81   Temp 98.3 F (36.8 C) (Oral)   Ht 5\' 8"  (1.727 m)   Wt 257 lb 12.8 oz (116.9 kg)   LMP 11/23/2017   SpO2 97%   BMI 39.20 kg/m  BP Readings from Last 3 Encounters:  08/12/23 106/74  08/12/23 112/70  05/20/23 108/73   Wt Readings from Last 3 Encounters:  08/12/23 257 lb 12.8 oz (116.9 kg)  08/12/23 254 lb (115.2 kg)  05/20/23 250 lb (113.4 kg)      Physical Exam Constitutional:      General: She is not in acute distress.    Appearance: Normal appearance.  HENT:     Head: Normocephalic and atraumatic.     Nose: Nose normal.     Mouth/Throat:     Mouth: Mucous membranes are moist.  Cardiovascular:     Rate and Rhythm: Normal rate and regular rhythm.     Heart sounds: Normal heart sounds. No murmur heard.    No gallop.  Pulmonary:     Effort: Pulmonary effort is normal. No respiratory distress.     Breath sounds: Normal breath sounds. No wheezing, rhonchi or rales.  Musculoskeletal:     Right knee: Crepitus present. No swelling, effusion or bony tenderness. No tenderness.     Left knee: Crepitus present. No swelling, effusion or bony tenderness. No tenderness.  Skin:    General: Skin is warm and dry.  Neurological:     Mental Status: She is alert and oriented to person, place, and time.      Results for orders placed or performed in visit on 08/12/23  POCT glycosylated hemoglobin (Hb A1C)  Result Value Ref Range   Hemoglobin A1C 6.5 (A) 4.0 - 5.6 %   HbA1c POC (<> result, manual entry)     HbA1c, POC  (prediabetic range)     HbA1c, POC (controlled diabetic range)     Diabetic Foot Exam - Simple   Simple Foot Form Diabetic Foot exam was performed with the following findings: Yes 08/12/2023  4:14 PM  Visual Inspection See comments: Yes Sensation Testing See comments: Yes Pulse Check Posterior Tibialis and Dorsalis pulse intact bilaterally: Yes Comments Calluses on bilateral feet.  Sensation was not intact to monofilament.  Vibratory sense was  not intact.        Assessment & Plan:  Elevated cholesterol -Elevated at POC testing for her job.  Previously normal with total cholesterol 154, HDL 43, LDL 91, triglycerides 97 on 08/27/2022 in clinic. -Recheck when fasting -Lifestyle modifications strongly encouraged. -     Lipid panel; Future  Arthritis -In feet and hands.  Likely also in bilateral knees. -Consider imaging of knees.  Patient wishes to wait at this time. -Left foot recently imaged 06/06/2023 by podiatry. -Discussed supportive care with topical analgesics, heat massage, stretching, etc. -Will start Mobic daily. R/b/a discussed.  Patient advised not to take with other NSAIDs. -     Meloxicam; Take 1 tablet (15 mg total) by mouth daily.  Dispense: 30 tablet; Refill: 3  Type 2 diabetes mellitus with diabetic neuropathy, with long-term current use of insulin (HCC) -controlled -hgb A1C 6.5% this visit -Continue current medications including metformin XR 500 mg with breakfast, Jardiance 25 mg daily -Continue statin and ACE I -Foot exam done this visit -Continue follow-up with endocrinology, Dr. Virgilio Frees   Return in about 4 months (around 12/10/2023).   Deeann Saint, MD

## 2023-08-15 ENCOUNTER — Other Ambulatory Visit (INDEPENDENT_AMBULATORY_CARE_PROVIDER_SITE_OTHER): Payer: 59

## 2023-08-15 DIAGNOSIS — E7800 Pure hypercholesterolemia, unspecified: Secondary | ICD-10-CM | POA: Diagnosis not present

## 2023-08-15 LAB — LIPID PANEL
Cholesterol: 168 mg/dL (ref 0–200)
HDL: 47.2 mg/dL (ref >39.00)
LDL Cholesterol: 92 mg/dL (ref 0–99)
NonHDL: 120.36
Total CHOL/HDL Ratio: 4
Triglycerides: 141 mg/dL (ref 0.0–149.0)
VLDL: 28.2 mg/dL (ref 0.0–40.0)

## 2023-08-17 ENCOUNTER — Other Ambulatory Visit: Payer: Self-pay

## 2023-08-17 DIAGNOSIS — N760 Acute vaginitis: Secondary | ICD-10-CM

## 2023-08-17 MED ORDER — NYSTATIN-TRIAMCINOLONE 100000-0.1 UNIT/GM-% EX OINT: 1.0000 | TOPICAL_OINTMENT | Freq: Two times a day (BID) | CUTANEOUS | 0 refills | Status: AC

## 2023-08-17 NOTE — Telephone Encounter (Signed)
Med refill request: nystatin-triamcinolone ointment Last AEX: 05/11/23 Next AEX: none scheduled Last MMG (if hormonal med) n/a Refill authorized: nystatin-triamcinolone ointment 30 gram.  Sent to provider for review.

## 2023-08-19 ENCOUNTER — Encounter: Payer: Self-pay | Admitting: Family Medicine

## 2023-08-22 ENCOUNTER — Ambulatory Visit: Payer: 59 | Admitting: Podiatry

## 2023-10-05 ENCOUNTER — Other Ambulatory Visit: Payer: Self-pay | Admitting: Family Medicine

## 2023-10-05 DIAGNOSIS — M1990 Unspecified osteoarthritis, unspecified site: Secondary | ICD-10-CM

## 2023-10-19 ENCOUNTER — Encounter: Payer: Self-pay | Admitting: Podiatry

## 2023-10-19 ENCOUNTER — Ambulatory Visit (INDEPENDENT_AMBULATORY_CARE_PROVIDER_SITE_OTHER): Payer: 59 | Admitting: Podiatry

## 2023-10-19 ENCOUNTER — Other Ambulatory Visit: Payer: Self-pay | Admitting: Cardiology

## 2023-10-19 DIAGNOSIS — M2041 Other hammer toe(s) (acquired), right foot: Secondary | ICD-10-CM

## 2023-10-19 DIAGNOSIS — E1140 Type 2 diabetes mellitus with diabetic neuropathy, unspecified: Secondary | ICD-10-CM | POA: Diagnosis not present

## 2023-10-19 DIAGNOSIS — Z794 Long term (current) use of insulin: Secondary | ICD-10-CM

## 2023-10-19 DIAGNOSIS — I480 Paroxysmal atrial fibrillation: Secondary | ICD-10-CM

## 2023-10-19 DIAGNOSIS — M2042 Other hammer toe(s) (acquired), left foot: Secondary | ICD-10-CM

## 2023-10-19 DIAGNOSIS — M722 Plantar fascial fibromatosis: Secondary | ICD-10-CM

## 2023-10-19 NOTE — Progress Notes (Signed)
  Subjective:  Patient ID: Julia Dickerson, female    DOB: 08-29-1975,   MRN: [id number]  No chief complaint on file.   48 y.o. female presents for diabetic foot check and interst in diabetic shoes. She relates the plantar fasciitis is still there but the neuropathy in her feet is most troublesome. She relates burning tingling and numbness in feet.  Patient is diabetic and last A1c was  Lab Results  Component Value Date   HGBA1C 6.5 (A) 08/12/2023   .   PCP:  Deeann Saint, MD    . Denies any other pedal complaints. Denies n/v/f/c.   Past Medical History:  Diagnosis Date   Anxiety    Arthritis    Atrial fibrillation (HCC)    a. paroxysmal, uses PRN Flecainide   Depression    Diabetes mellitus without complication (HCC)    Dysrhythmia    GERD (gastroesophageal reflux disease)    Hypertension    Obesity    PCOS (polycystic ovarian syndrome)    UTI (lower urinary tract infection)     Objective:  Physical Exam: Vascular: DP/PT pulses 2/4 bilateral. CFT <3 seconds. Absent hair growth on digits. Edema noted to bilateral lower extremities. Xerosis noted bilaterally.  Skin. No lacerations or abrasions bilateral feet. Nails 1-5 bilateral  are within normal limits. .  Musculoskeletal: MMT 5/5 bilateral lower extremities in DF, PF, Inversion and Eversion. Deceased ROM in DF of ankle joint. Hammered digtis 2-5 bilateral some tenderness noted to medial calcaneal tubercle on left.  Neurological: Sensation intact to light touch. Protective sensation diminished bilateral.    Assessment:   1. Type 2 diabetes mellitus with diabetic neuropathy, with long-term current use of insulin (HCC)   2. Hammertoe, bilateral   3. Plantar fasciitis of left foot      Plan:  Patient was evaluated and treated and all questions answered. -Discussed and educated patient on diabetic foot care, especially with  regards to the vascular, neurological and musculoskeletal systems.  -Stressed the  importance of good glycemic control and the detriment of not  controlling glucose levels in relation to the foot. -Discussed supportive shoes at all times and checking feet regularly.  -Dm shoes ordered -Answered all patient questions -Patient to return  in1 year for dm foot check -Patient advised to call the office if any problems or questions arise in the meantime.   Louann Sjogren, DPM

## 2023-11-02 ENCOUNTER — Other Ambulatory Visit: Payer: Self-pay | Admitting: Family Medicine

## 2023-11-02 DIAGNOSIS — M1990 Unspecified osteoarthritis, unspecified site: Secondary | ICD-10-CM

## 2023-12-05 ENCOUNTER — Other Ambulatory Visit

## 2023-12-06 ENCOUNTER — Telehealth: Payer: Self-pay

## 2023-12-06 NOTE — Telephone Encounter (Signed)
 Referral sent to [facility] today

## 2023-12-25 ENCOUNTER — Other Ambulatory Visit: Payer: Self-pay | Admitting: Cardiology

## 2024-02-13 ENCOUNTER — Ambulatory Visit: Payer: 59 | Admitting: Internal Medicine

## 2024-02-13 ENCOUNTER — Encounter: Payer: Self-pay | Admitting: Internal Medicine

## 2024-02-13 VITALS — BP 120/80 | HR 76 | Ht 68.0 in | Wt 266.0 lb

## 2024-02-13 DIAGNOSIS — E785 Hyperlipidemia, unspecified: Secondary | ICD-10-CM

## 2024-02-13 DIAGNOSIS — Z794 Long term (current) use of insulin: Secondary | ICD-10-CM

## 2024-02-13 DIAGNOSIS — Z7984 Long term (current) use of oral hypoglycemic drugs: Secondary | ICD-10-CM

## 2024-02-13 DIAGNOSIS — E1140 Type 2 diabetes mellitus with diabetic neuropathy, unspecified: Secondary | ICD-10-CM | POA: Diagnosis not present

## 2024-02-13 LAB — POCT GLYCOSYLATED HEMOGLOBIN (HGB A1C): Hemoglobin A1C: 6.8 % — AB (ref 4.0–5.6)

## 2024-02-13 NOTE — Progress Notes (Signed)
 Name: Jennesis Ramaswamy  Age/ Sex: 48 y.o., female   MRN/ DOB: [id number], 1976/05/23     PCP: Mercer Clotilda SAUNDERS, MD   Reason for Endocrinology Evaluation: Type 2 diabetes Mellitus  Initial Endocrine Consultative Visit:  09/05/2018    PATIENT IDENTIFIER: Ms. Alsie Younes is a 48 y.o. female with a past medical history of PCOS, T2DM, PAF and HTN.  The patient has followed with Endocrinology clinic since 09/05/2018 for consultative assistance with management of her diabetes.  DIABETIC HISTORY:  Ms. Salak was diagnosed with T2DM since the 2000's.  She was initially on metformin , she was switched to MDI insulin  regimen which helped control her hyperglycemia at the time, the patient was subsequently switched to oral glycemic agents in 2012 due to improved glucose control.  She was restarted on basal insulin  in 2019 due to persistent hyperglycemia, she has tried Victoza  in the past with severe nausea. Her hemoglobin A1c has ranged from 8.7% in in 2019, peaking at 10.7% in 08/14/2018.   Prandial insulin  started in February 2020, in addition to continuing her metformin  and basal insulin . Rybelsus  is cost prohibitive   By 05/2019 we switched her MDI regimen to insulin  mix, we started Trulicity , and continued metformin .  Trulicity  stopped 1/2/021 due to GI side effects  Jardiance  started 07/2020  Started Mounjaro  05/2022 which was discontinued 07/2023 due to intolerance to 2.5 mg dose  SUBJECTIVE:   During the last visit (08/12/2023): A1c 6.9 %.     Today (02/13/2024): Ms. Fitzhenry is here for a follow up on diabetes management. She checks glucose multiple x a day, she has not had any  hypoglycemic episodes since her last clinic visit.   She was seen by podiatry for plantar fasciitis 06/2023 She had a follow-up with cardiology 04/2023 for A-fib, HTN, and dyslipidemia She denies any GI side symptoms at this time Unfortunately she lost both of her parents in February, 2025 and has been very  stressed   CONTINUOUS GLUCOSE MONITORING RECORD INTERPRETATION    Dates of Recording: 7/8-7/21/2025  Sensor description:dexcom   Results statistics:   CGM use % of time 95  Average and SD 210/58  Time in range 34%  % Time Above 180 44  % Time above 250 22  % Time Below target 0     Glycemic patterns summary:Bg's are optimal overnight and increased throughout the day Hyperglycemic episodes postprandial  Hypoglycemic episodes occurred N/A  Overnight periods: trend down     HOME DIABETES REGIMEN:  NovoLog  mix 60 units with Breakfast and 50 units with Supper  Metformin  500 mg XR 1 tab daily  Jardiance  25 mg daily  Rosuvastatin  40 mg daily    DIABETIC COMPLICATIONS: Microvascular complications:  Neuropathy  Denies: CKD, retinopathy  Last eye exam: Completed 07/2023   Macrovascular complications:    Denies: CAD, PVD, CVA     HISTORY:  Past Medical History:  Past Medical History:  Diagnosis Date   Anxiety    Arthritis    Atrial fibrillation (HCC)    a. paroxysmal, uses PRN Flecainide    Depression    Diabetes mellitus without complication (HCC)    Dysrhythmia    GERD (gastroesophageal reflux disease)    Hypertension    Obesity    PCOS (polycystic ovarian syndrome)    UTI (lower urinary tract infection)    Past Surgical History:  Past Surgical History:  Procedure Laterality Date   DILATION AND CURETTAGE OF UTERUS  2011 and 2012   x2  GXT - Graded Exercise Tolerance Test  07/22/2016   Exercise time 6 minutes - 7 METs. Maximum heart rate 146 BPM. - No EKG changes. No arrhythmias or chest pain.: Low risk test.   TRANSTHORACIC ECHOCARDIOGRAM  06/2016   EF 6570% with mild LVH. Normal wall motion and diastolic function. Mild RAE. Otherwise normal.   WISDOM TOOTH EXTRACTION     Social History:  reports that she quit smoking about 5 years ago. Her smoking use included cigarettes and e-cigarettes. She started smoking about 26 years ago. She has a 10.5  pack-year smoking history. She has been exposed to tobacco smoke. She has never used smokeless tobacco. She reports that she does not currently use alcohol. She reports that she does not use drugs. Family History:  Family History  Problem Relation Age of Onset   Arthritis Mother    Diabetes Mother    Kidney disease Mother    Heart disease Mother    Heart failure Father    Heart attack Father    Cancer Father    Diabetes Father    Hearing loss Father    Hyperlipidemia Father    Hypertension Father    Arthritis Sister    COPD Maternal Grandmother    Heart disease Maternal Grandmother    Hyperlipidemia Maternal Grandmother    Hyperlipidemia Maternal Grandfather    Hypertension Maternal Grandfather    Hearing loss Maternal Grandfather    Alcohol abuse Maternal Grandfather    Arthritis Maternal Grandfather    Stroke Maternal Grandfather    Hyperlipidemia Paternal Grandmother    Miscarriages / Stillbirths Paternal Grandmother    Kidney disease Paternal Grandfather    Hypertension Paternal Grandfather    Heart attack Paternal Grandfather    Diabetes Paternal Grandfather    Breast cancer Neg Hx      HOME MEDICATIONS: Allergies as of 02/13/2024       Reactions   Sulfa Antibiotics Hives        Medication List        Accurate as of February 13, 2024  3:05 PM. If you have any questions, ask your nurse or doctor.          BD Pen Needle Nano U/F 32G X 4 MM Misc Generic drug: Insulin  Pen Needle 1 Device by Other route in the morning and at bedtime. use as directed   Dexcom G7 Sensor Misc Use as instructed to monitor blood sugar, change every 10 days.   diltiazem  240 MG 24 hr capsule Commonly known as: CARDIZEM  CD Take 1 capsule (240 mg total) by mouth daily.   diltiazem  60 MG tablet Commonly known as: CARDIZEM  MAY TAKE 1 TABLET BY MOUTH EVERY 6 HOURS AS NEEDED UP TO 3 DOSES  PER DAY FOR BREAKTHROUGH (ALONG  WITH USING FLECAINIDE  100 MG)   Eliquis  5 MG Tabs  tablet Generic drug: apixaban  TAKE 1 TABLET BY MOUTH TWICE  DAILY AS DIRECTED. ( ONLY USE  FOR BREAK THROUGH AFIB)   empagliflozin  25 MG Tabs tablet Commonly known as: Jardiance  Take 1 tablet (25 mg total) by mouth daily before breakfast.   flecainide  100 MG tablet Commonly known as: TAMBOCOR  TAKE 1 TABLET BY MOUTH TWICE  DAILY AS NEEDED FOR BREAKTHROUGH AFIB   fluticasone  50 MCG/ACT nasal spray Commonly known as: FLONASE  USE 1 SPRAY IN BOTH NOSTRILS  DAILY   GAS-X PO Take by mouth in the morning and at bedtime.   Insulin  Lispro Prot & Lispro (75-25) 100 UNIT/ML Kwikpen Commonly known  as: HumaLOG  Mix 75/25 KwikPen Inject 60 Units into the skin daily with breakfast AND 50 Units daily with supper.   lisinopril  20 MG tablet Commonly known as: ZESTRIL  TAKE 1 TABLET BY MOUTH DAILY   meloxicam  15 MG tablet Commonly known as: MOBIC  TAKE 1 TABLET (15 MG TOTAL) BY MOUTH DAILY.   meloxicam  15 MG tablet Commonly known as: MOBIC  TAKE 1 TABLET BY MOUTH DAILY   metFORMIN  500 MG 24 hr tablet Commonly known as: GLUCOPHAGE -XR Take 1 tablet (500 mg total) by mouth daily with breakfast.   multivitamin with minerals Tabs tablet Take 1 tablet by mouth daily.   nystatin -triamcinolone  ointment Commonly known as: MYCOLOG Apply 1 Application topically 2 (two) times daily.   omeprazole 20 MG capsule Commonly known as: PRILOSEC Take 20 mg by mouth every evening.   rosuvastatin  40 MG tablet Commonly known as: CRESTOR  Take 1 tablet (40 mg total) by mouth daily.         OBJECTIVE:   Vital Signs: BP 120/80 (BP Location: Left Arm, Patient Position: Sitting, Cuff Size: Normal)   Pulse 76   Ht 5' 8 (1.727 m)   Wt 266 lb (120.7 kg)   LMP 11/23/2017   SpO2 97%   BMI 40.45 kg/m   Wt Readings from Last 3 Encounters:  02/13/24 266 lb (120.7 kg)  08/12/23 257 lb 12.8 oz (116.9 kg)  08/12/23 254 lb (115.2 kg)     Exam: General: Pt appears well and is in NAD  Lungs: Clear with  good BS bilat   Heart: RRR   Extremities: No pretibial edema.   Neuro: MS is good with appropriate affect, pt is alert and Ox3        DM foot exam: 07/25/2023 per podiatry     DATA REVIEWED:  Lab Results  Component Value Date   HGBA1C 6.8 (A) 02/13/2024   HGBA1C 6.5 (A) 08/12/2023   HGBA1C 6.9 (H) 08/27/2022    Latest Reference Range & Units 08/15/23 09:54  Total CHOL/HDL Ratio  4  Cholesterol 0 - 200 mg/dL 831  HDL Cholesterol >60.99 mg/dL 52.79  LDL (calc) 0 - 99 mg/dL 92  NonHDL  879.63  Triglycerides 0.0 - 149.0 mg/dL 858.9  VLDL 0.0 - 59.9 mg/dL 71.7     ASSESSMENT / PLAN / RECOMMENDATIONS:   1) Type 2 Diabetes Mellitus,optimally controlled , With Neuropathic Complications - Most recent A1c of 6.8 %. Goal A1c < 7.0 %.    -Her A1c is optimal but her CGM readings have been discordant with an average of 210 mg/DL -She is intolerant to Trulicity  and higher doses of metformin  -She is not tolerating Mounjaro  2.5 mg due to bloating and sulfur taste - Will increase morning Humalog  as below   MEDICATIONS: - Continue Metformin  500 mg, 1 tablet daily  - Continue Jardiance  25 mg daily - Change Humalog  Mix 64 units with Breakfast  and 50 units with Supper     EDUCATION / INSTRUCTIONS: BG monitoring instructions: Patient is instructed to check her blood sugars 4 times a day, fasting and bedtime. Call  Endocrinology clinic if: BG persistently < 70 . I reviewed the Rule of 15 for the treatment of hypoglycemia in detail with the patient. Literature supplied.    2) Diabetic complications:  Eye: Does not have known diabetic retinopathy.  Neuro/ Feet: Does have known diabetic peripheral neuropathy. Renal: Patient does not have known baseline CKD. She is on an ACEI/ARB at present.    3) Dyslipidemia :  -  Lipid panel from January, 2025 showed LDL of 92 Mg/DL with triglycerides of 858 Mg/DL  Medication   Continue rosuvastatin  40 mg daily   F/U in 6  months   Signed electronically by: Stefano Redgie Butts, MD  [facility]  [facility] [address] [place], [place] [address] Phone: [phone] FAX: [phone]   CC: Mercer Clotilda SAUNDERS, MD [address] [place] [address] Phone: [phone]  Fax: [phone]  Return to Endocrinology clinic as below: Future Appointments  Date Time Provider Department Center  10/22/2024  4:15 PM Joya Stabs, DPM [facility]

## 2024-02-13 NOTE — Patient Instructions (Addendum)
-   Continue Metformin  500 mg, 1 tablet daily  - Change Humalog   Mix 64 units with Breakfast and 50 units with Supper  - Continue Jardiance  25  mg, 1 tablet daily with breakfast     - HOW TO TREAT LOW BLOOD SUGARS (Blood sugar LESS THAN 70 MG/DL) Please follow the RULE OF 15 for the treatment of hypoglycemia treatment (when your (blood sugars are less than 70 mg/dL)   STEP 1: Take 15 grams of carbohydrates when your blood sugar is low, which includes:  3-4 GLUCOSE TABS  OR 3-4 OZ OF JUICE OR REGULAR SODA OR ONE TUBE OF GLUCOSE GEL    STEP 2: RECHECK blood sugar in 15 MINUTES STEP 3: If your blood sugar is still low at the 15 minute recheck --> then, go back to STEP 1 and treat AGAIN with another 15 grams of carbohydrates.

## 2024-02-14 ENCOUNTER — Encounter: Payer: Self-pay | Admitting: Internal Medicine

## 2024-02-15 ENCOUNTER — Other Ambulatory Visit: Payer: Self-pay

## 2024-02-15 ENCOUNTER — Encounter (HOSPITAL_BASED_OUTPATIENT_CLINIC_OR_DEPARTMENT_OTHER): Payer: Self-pay

## 2024-02-15 DIAGNOSIS — I480 Paroxysmal atrial fibrillation: Secondary | ICD-10-CM | POA: Insufficient documentation

## 2024-02-15 DIAGNOSIS — E785 Hyperlipidemia, unspecified: Secondary | ICD-10-CM | POA: Diagnosis not present

## 2024-02-15 DIAGNOSIS — L03211 Cellulitis of face: Principal | ICD-10-CM | POA: Insufficient documentation

## 2024-02-15 DIAGNOSIS — I10 Essential (primary) hypertension: Secondary | ICD-10-CM | POA: Insufficient documentation

## 2024-02-15 DIAGNOSIS — D72829 Elevated white blood cell count, unspecified: Secondary | ICD-10-CM | POA: Diagnosis not present

## 2024-02-15 DIAGNOSIS — Z794 Long term (current) use of insulin: Secondary | ICD-10-CM | POA: Insufficient documentation

## 2024-02-15 DIAGNOSIS — E119 Type 2 diabetes mellitus without complications: Secondary | ICD-10-CM | POA: Diagnosis not present

## 2024-02-15 DIAGNOSIS — K0889 Other specified disorders of teeth and supporting structures: Secondary | ICD-10-CM | POA: Diagnosis present

## 2024-02-15 LAB — CBC WITH DIFFERENTIAL/PLATELET
Abs Immature Granulocytes: 0.05 K/uL (ref 0.00–0.07)
Basophils Absolute: 0 K/uL (ref 0.0–0.1)
Basophils Relative: 0 %
Eosinophils Absolute: 0.2 K/uL (ref 0.0–0.5)
Eosinophils Relative: 2 %
HCT: 46.3 % — ABNORMAL HIGH (ref 36.0–46.0)
Hemoglobin: 16.5 g/dL — ABNORMAL HIGH (ref 12.0–15.0)
Immature Granulocytes: 1 %
Lymphocytes Relative: 20 %
Lymphs Abs: 2.3 K/uL (ref 0.7–4.0)
MCH: 31.2 pg (ref 26.0–34.0)
MCHC: 35.6 g/dL (ref 30.0–36.0)
MCV: 87.5 fL (ref 80.0–100.0)
Monocytes Absolute: 0.9 K/uL (ref 0.1–1.0)
Monocytes Relative: 8 %
Neutro Abs: 7.7 K/uL (ref 1.7–7.7)
Neutrophils Relative %: 69 %
Platelets: 252 K/uL (ref 150–400)
RBC: 5.29 MIL/uL — ABNORMAL HIGH (ref 3.87–5.11)
RDW: 13.1 % (ref 11.5–15.5)
WBC: 11.1 K/uL — ABNORMAL HIGH (ref 4.0–10.5)
nRBC: 0 % (ref 0.0–0.2)

## 2024-02-15 LAB — COMPREHENSIVE METABOLIC PANEL WITH GFR
ALT: 19 U/L (ref 0–44)
AST: 20 U/L (ref 15–41)
Albumin: 4.9 g/dL (ref 3.5–5.0)
Alkaline Phosphatase: 71 U/L (ref 38–126)
Anion gap: 13 (ref 5–15)
BUN: 22 mg/dL — ABNORMAL HIGH (ref 6–20)
CO2: 21 mmol/L — ABNORMAL LOW (ref 22–32)
Calcium: 9.8 mg/dL (ref 8.9–10.3)
Chloride: 104 mmol/L (ref 98–111)
Creatinine, Ser: 0.66 mg/dL (ref 0.44–1.00)
GFR, Estimated: >60 mL/min (ref >60)
Glucose, Bld: 174 mg/dL — ABNORMAL HIGH (ref 70–99)
Potassium: 4 mmol/L (ref 3.5–5.1)
Sodium: 138 mmol/L (ref 135–145)
Total Bilirubin: 0.6 mg/dL (ref 0.0–1.2)
Total Protein: 8.3 g/dL — ABNORMAL HIGH (ref 6.5–8.1)

## 2024-02-15 NOTE — ED Triage Notes (Addendum)
 Pt saw the dentist today was started on antibiotics and swelling to right side of face has become worse.   Rt. Side of face swollen and pt c/o HA

## 2024-02-16 ENCOUNTER — Observation Stay (HOSPITAL_BASED_OUTPATIENT_CLINIC_OR_DEPARTMENT_OTHER)
Admission: EM | Admit: 2024-02-16 | Discharge: 2024-02-18 | Disposition: A | Discharge status: Home / Self Care | Attending: Internal Medicine | Admitting: Internal Medicine

## 2024-02-16 ENCOUNTER — Emergency Department (HOSPITAL_BASED_OUTPATIENT_CLINIC_OR_DEPARTMENT_OTHER)

## 2024-02-16 DIAGNOSIS — I480 Paroxysmal atrial fibrillation: Secondary | ICD-10-CM | POA: Diagnosis not present

## 2024-02-16 DIAGNOSIS — Z794 Long term (current) use of insulin: Secondary | ICD-10-CM

## 2024-02-16 DIAGNOSIS — E118 Type 2 diabetes mellitus with unspecified complications: Secondary | ICD-10-CM

## 2024-02-16 DIAGNOSIS — D72829 Elevated white blood cell count, unspecified: Secondary | ICD-10-CM | POA: Diagnosis present

## 2024-02-16 DIAGNOSIS — L03211 Cellulitis of face: Principal | ICD-10-CM | POA: Diagnosis present

## 2024-02-16 DIAGNOSIS — E785 Hyperlipidemia, unspecified: Secondary | ICD-10-CM | POA: Diagnosis present

## 2024-02-16 DIAGNOSIS — I10 Essential (primary) hypertension: Secondary | ICD-10-CM | POA: Diagnosis not present

## 2024-02-16 LAB — GLUCOSE, CAPILLARY
Glucose-Capillary: 219 mg/dL — ABNORMAL HIGH (ref 70–99)
Glucose-Capillary: 223 mg/dL — ABNORMAL HIGH (ref 70–99)

## 2024-02-16 LAB — HIV ANTIBODY (ROUTINE TESTING W REFLEX): HIV Screen 4th Generation wRfx: NONREACTIVE

## 2024-02-16 MED ORDER — INSULIN LISPRO PROT & LISPRO (75-25 MIX) 100 UNIT/ML KWIKPEN: 50.0000 [IU] | PEN_INJECTOR | Freq: Every day | SUBCUTANEOUS | Status: DC

## 2024-02-16 MED ORDER — FLECAINIDE ACETATE 100 MG PO TABS: 100.0000 mg | ORAL_TABLET | Freq: Two times a day (BID) | ORAL | Status: DC | PRN

## 2024-02-16 MED ORDER — APIXABAN 5 MG PO TABS
5.0000 mg | ORAL_TABLET | Freq: Two times a day (BID) | ORAL | Status: DC
  Filled 2024-02-16: qty 1

## 2024-02-16 MED ORDER — ONDANSETRON HCL 4 MG/2ML IJ SOLN
4.0000 mg | Freq: Four times a day (QID) | INTRAMUSCULAR | Status: DC | PRN
  Administered 2024-02-16 – 2024-02-17 (×2): 4 mg via INTRAVENOUS
  Filled 2024-02-16 (×2): qty 2

## 2024-02-16 MED ORDER — INSULIN ASPART 100 UNIT/ML IJ SOLN: 10.0000 [IU] | Freq: Every day | INTRAMUSCULAR | Status: DC

## 2024-02-16 MED ORDER — IOHEXOL 300 MG/ML  SOLN
75.0000 mL | Freq: Once | INTRAMUSCULAR | Status: AC | PRN
  Administered 2024-02-16: 75 mL via INTRAVENOUS

## 2024-02-16 MED ORDER — INSULIN LISPRO PROT & LISPRO (75-25 MIX) 100 UNIT/ML KWIKPEN: 60.0000 [IU] | PEN_INJECTOR | Freq: Every day | SUBCUTANEOUS | Status: DC

## 2024-02-16 MED ORDER — SODIUM CHLORIDE 0.9 % IV SOLN
3.0000 g | Freq: Once | INTRAVENOUS | Status: AC
  Administered 2024-02-16: 3 g via INTRAVENOUS

## 2024-02-16 MED ORDER — HYDRALAZINE HCL 20 MG/ML IJ SOLN
10.0000 mg | INTRAMUSCULAR | Status: DC | PRN
  Filled 2024-02-16: qty 1

## 2024-02-16 MED ORDER — ROSUVASTATIN CALCIUM 20 MG PO TABS
40.0000 mg | ORAL_TABLET | Freq: Every day | ORAL | Status: DC
  Administered 2024-02-16 – 2024-02-18 (×3): 40 mg via ORAL
  Filled 2024-02-16 (×3): qty 2

## 2024-02-16 MED ORDER — ONDANSETRON HCL 4 MG PO TABS: 4.0000 mg | ORAL_TABLET | Freq: Four times a day (QID) | ORAL | Status: DC | PRN

## 2024-02-16 MED ORDER — SODIUM CHLORIDE 0.9 % IV SOLN
3.0000 g | Freq: Four times a day (QID) | INTRAVENOUS | Status: DC
  Administered 2024-02-16 – 2024-02-18 (×9): 3 g via INTRAVENOUS
  Filled 2024-02-16 (×9): qty 8

## 2024-02-16 MED ORDER — ONDANSETRON HCL 4 MG/2ML IJ SOLN
4.0000 mg | Freq: Once | INTRAMUSCULAR | Status: AC
  Administered 2024-02-16: 4 mg via INTRAVENOUS
  Filled 2024-02-16: qty 2

## 2024-02-16 MED ORDER — DILTIAZEM HCL ER COATED BEADS 120 MG PO CP24
240.0000 mg | ORAL_CAPSULE | Freq: Every day | ORAL | Status: DC
  Administered 2024-02-16 – 2024-02-18 (×3): 240 mg via ORAL
  Filled 2024-02-16 (×3): qty 2

## 2024-02-16 MED ORDER — OXYCODONE-ACETAMINOPHEN 5-325 MG PO TABS
1.0000 | ORAL_TABLET | ORAL | Status: AC | PRN
  Administered 2024-02-16 (×2): 1 via ORAL
  Filled 2024-02-16 (×2): qty 1

## 2024-02-16 MED ORDER — SODIUM CHLORIDE 0.9 % IV BOLUS
1000.0000 mL | Freq: Once | INTRAVENOUS | Status: AC
  Administered 2024-02-16: 1000 mL via INTRAVENOUS

## 2024-02-16 MED ORDER — INSULIN LISPRO PROT & LISPRO (75-25 MIX) 100 UNIT/ML KWIKPEN: 40.0000 [IU] | PEN_INJECTOR | Freq: Every day | SUBCUTANEOUS | Status: DC

## 2024-02-16 MED ORDER — ACETAMINOPHEN 650 MG RE SUPP: 650.0000 mg | Freq: Four times a day (QID) | RECTAL | Status: DC | PRN

## 2024-02-16 MED ORDER — INSULIN GLARGINE-YFGN 100 UNIT/ML ~~LOC~~ SOLN
35.0000 [IU] | Freq: Every day | SUBCUTANEOUS | Status: DC
  Filled 2024-02-16: qty 0.35

## 2024-02-16 MED ORDER — LISINOPRIL 20 MG PO TABS
20.0000 mg | ORAL_TABLET | Freq: Every day | ORAL | Status: DC
  Administered 2024-02-16 – 2024-02-18 (×3): 20 mg via ORAL
  Filled 2024-02-16 (×3): qty 1

## 2024-02-16 MED ORDER — ENOXAPARIN SODIUM 40 MG/0.4ML IJ SOSY
40.0000 mg | PREFILLED_SYRINGE | INTRAMUSCULAR | Status: DC
  Administered 2024-02-16: 40 mg via SUBCUTANEOUS
  Filled 2024-02-16: qty 0.4

## 2024-02-16 MED ORDER — ALBUTEROL SULFATE (2.5 MG/3ML) 0.083% IN NEBU: 2.5000 mg | INHALATION_SOLUTION | Freq: Four times a day (QID) | RESPIRATORY_TRACT | Status: DC | PRN

## 2024-02-16 MED ORDER — EMPAGLIFLOZIN 25 MG PO TABS
25.0000 mg | ORAL_TABLET | Freq: Every day | ORAL | Status: DC
  Administered 2024-02-17 – 2024-02-18 (×2): 25 mg via ORAL
  Filled 2024-02-16 (×2): qty 1

## 2024-02-16 MED ORDER — MORPHINE SULFATE (PF) 4 MG/ML IV SOLN
4.0000 mg | Freq: Once | INTRAVENOUS | Status: AC
  Administered 2024-02-16: 4 mg via INTRAVENOUS
  Filled 2024-02-16: qty 1

## 2024-02-16 MED ORDER — INSULIN GLARGINE-YFGN 100 UNIT/ML ~~LOC~~ SOLN
30.0000 [IU] | Freq: Every day | SUBCUTANEOUS | Status: DC
  Filled 2024-02-16 (×4): qty 0.3

## 2024-02-16 MED ORDER — SODIUM CHLORIDE 0.9% FLUSH
3.0000 mL | Freq: Two times a day (BID) | INTRAVENOUS | Status: DC
  Administered 2024-02-16 – 2024-02-18 (×4): 3 mL via INTRAVENOUS

## 2024-02-16 MED ORDER — ACETAMINOPHEN 325 MG PO TABS
650.0000 mg | ORAL_TABLET | Freq: Four times a day (QID) | ORAL | Status: DC | PRN
  Administered 2024-02-16 – 2024-02-17 (×3): 650 mg via ORAL
  Filled 2024-02-16 (×3): qty 2

## 2024-02-16 MED ORDER — OXYCODONE-ACETAMINOPHEN 5-325 MG PO TABS
1.0000 | ORAL_TABLET | Freq: Once | ORAL | Status: AC
  Administered 2024-02-16: 1 via ORAL
  Filled 2024-02-16: qty 1

## 2024-02-16 MED ORDER — OXYCODONE-ACETAMINOPHEN 5-325 MG PO TABS
1.0000 | ORAL_TABLET | Freq: Four times a day (QID) | ORAL | Status: DC | PRN
  Administered 2024-02-16 – 2024-02-17 (×2): 1 via ORAL
  Filled 2024-02-16 (×2): qty 1

## 2024-02-16 NOTE — Plan of Care (Signed)
  Problem: Activity: Goal: Risk for activity intolerance will decrease Outcome: Progressing   Problem: Nutrition: Goal: Adequate nutrition will be maintained Outcome: Progressing   Problem: Pain Managment: Goal: General experience of comfort will improve and/or be controlled Outcome: Progressing   Problem: Skin Integrity: Goal: Risk for impaired skin integrity will decrease Outcome: Progressing

## 2024-02-16 NOTE — Progress Notes (Signed)
 Plan of Care Note for accepted transfer   Patient: Julia Dickerson MRN: [id number]   DOA: 02/16/2024  Facility requesting transfer: [facility]   Requesting Provider: Dr. Griselda   Reason for transfer: Facial cellulitis   Facility course: 48 yr old female with T2DM, HTN, HLD, OSA, and PAF on Eliquis  who was started on antibiotics by a dentist yesterday but now presents with increased right facial swelling.   She is afebrile. WBC is 11.1. CT demonstrates facial cellulitis without a drainable fluid collection.   She was started on antibiotics in the ED.   Plan of care: The patient is accepted for admission to Med-surg  unit, at [facility].   Author: Evalene GORMAN Sprinkles, MD 02/16/2024  Check [email or web address] for on-call coverage.  Nursing staff, Please call [facility] Admits & Consults System-Wide number on Amion as soon as patient's arrival, so appropriate admitting provider can evaluate the pt.

## 2024-02-16 NOTE — ED Notes (Signed)
 Patient transported to CT

## 2024-02-16 NOTE — Progress Notes (Signed)
 Patient arrived to the Unit from [facility] . alert and oriented, in no distress, right side face red and swollen, on room air. pain level 3 and tolerable. Female friend at bedside, oriented to room and environment. Admissions paged and informed patient has arrived to the unit. Waiting on orders.

## 2024-02-16 NOTE — ED Notes (Signed)
 Called [id number] to inform them [facility] is in route with pt. and to have RN sign-off on handoff.

## 2024-02-16 NOTE — H&P (Addendum)
 History and Physical    Patient: Julia Dickerson FMW:[id number] DOB: 1976-02-21 DOA: 02/16/2024 DOS: the patient was seen and examined on 02/16/2024 PCP: Mercer Clotilda SAUNDERS, MD  Patient coming from: Transferred from [facility]  Chief Complaint:  Chief Complaint  Patient presents with   Dental Pain   HPI: Julia Dickerson is a 48 y.o. female with medical history significant of hypertension, paroxysmal atrial fibrillation, diabetes mellitus type 2, PCOS, depression, anxiety, obesity, and GERD presents with swelling of the right side of her face.  Symptoms began 3 days ago with pain on the right upper canine.  She was seen by her dentist other day and prescribed amoxicillin  for which she had taken 2 doses yesterday.  They had scheduled her to have the tooth extracted out in 2 weeks.  However this morning when she woke up she had developed significant swelling of the right side of her face causing her right eye to close as well as pain.  Denies having any significant fever, shortness of breath, or difficulty swallowing.  She makes note that she asked Eliquis  on a as needed basis after episodes of atrial fibrillation and is not continuously on the medication.  Last episode of atrial fibrillation was sometime ago.  In the emergency department patient was noted to be afebrile with blood pressures elevated up to 190/103, and all other vital signs maintained.  Labs significant for WBC 11.1, hemoglobin 16.5, BUN 22, creatinine 0.66, and glucose 174.  Maxillofacial CT was done which noted moderate right facial soft tissue swelling compatible with cellulitis likely secondary to periodontal disease with no drainable fluid collection appreciated.  Patient had been given 1 L of normal saline IV fluids, Zofran , morphine , and started on empiric antibiotics of Unasyn .   Review of Systems: As mentioned in the history of present illness. All other systems reviewed and are negative. Past Medical History:   Diagnosis Date   Anxiety    Arthritis    Atrial fibrillation (HCC)    a. paroxysmal, uses PRN Flecainide    Depression    Diabetes mellitus without complication (HCC)    Dysrhythmia    GERD (gastroesophageal reflux disease)    Hypertension    Obesity    PCOS (polycystic ovarian syndrome)    UTI (lower urinary tract infection)    Past Surgical History:  Procedure Laterality Date   DILATION AND CURETTAGE OF UTERUS  2011 and 2012   x2   GXT - Graded Exercise Tolerance Test  07/22/2016   Exercise time 6 minutes - 7 METs. Maximum heart rate 146 BPM. - No EKG changes. No arrhythmias or chest pain.: Low risk test.   TRANSTHORACIC ECHOCARDIOGRAM  06/2016   EF 6570% with mild LVH. Normal wall motion and diastolic function. Mild RAE. Otherwise normal.   WISDOM TOOTH EXTRACTION     Social History:  reports that she quit smoking about 5 years ago. Her smoking use included cigarettes and e-cigarettes. She started smoking about 26 years ago. She has a 10.5 pack-year smoking history. She has been exposed to tobacco smoke. She has never used smokeless tobacco. She reports that she does not currently use alcohol. She reports that she does not use drugs.  Allergies  Allergen Reactions   Sulfa Antibiotics Hives    Family History  Problem Relation Age of Onset   Arthritis Mother    Diabetes Mother    Kidney disease Mother    Heart disease Mother    Heart failure Father  Heart attack Father    Cancer Father    Diabetes Father    Hearing loss Father    Hyperlipidemia Father    Hypertension Father    Arthritis Sister    COPD Maternal Grandmother    Heart disease Maternal Grandmother    Hyperlipidemia Maternal Grandmother    Hyperlipidemia Maternal Grandfather    Hypertension Maternal Grandfather    Hearing loss Maternal Grandfather    Alcohol abuse Maternal Grandfather    Arthritis Maternal Grandfather    Stroke Maternal Grandfather    Hyperlipidemia Paternal Grandmother     Miscarriages / Stillbirths Paternal Grandmother    Kidney disease Paternal Grandfather    Hypertension Paternal Grandfather    Heart attack Paternal Grandfather    Diabetes Paternal Grandfather    Breast cancer Neg Hx     Prior to Admission medications   Medication Sig Start Date End Date Taking? Authorizing Provider  Continuous Glucose Sensor (DEXCOM G7 SENSOR) MISC Use as instructed to monitor blood sugar, change every 10 days. 08/12/23   Shamleffer, Ibtehal Jaralla, MD  diltiazem  (CARDIZEM  CD) 240 MG 24 hr capsule Take 1 capsule (240 mg total) by mouth daily. 05/20/23   Anner Alm ORN, MD  diltiazem  (CARDIZEM ) 60 MG tablet MAY TAKE 1 TABLET BY MOUTH EVERY 6 HOURS AS NEEDED UP TO 3 DOSES  PER DAY FOR BREAKTHROUGH (ALONG  WITH USING FLECAINIDE  100 MG) 10/19/23   Anner Alm ORN, MD  ELIQUIS  5 MG TABS tablet TAKE 1 TABLET BY MOUTH TWICE  DAILY AS DIRECTED. ( ONLY USE  FOR BREAK THROUGH AFIB) 12/26/23   Anner Alm ORN, MD  empagliflozin  (JARDIANCE ) 25 MG TABS tablet Take 1 tablet (25 mg total) by mouth daily before breakfast. 08/12/23   Shamleffer, Ibtehal Jaralla, MD  flecainide  (TAMBOCOR ) 100 MG tablet TAKE 1 TABLET BY MOUTH TWICE  DAILY AS NEEDED FOR BREAKTHROUGH AFIB 12/26/23   Anner Alm ORN, MD  fluticasone  (FLONASE ) 50 MCG/ACT nasal spray USE 1 SPRAY IN BOTH NOSTRILS  DAILY 09/07/22   Mercer Clotilda SAUNDERS, MD  Insulin  Lispro Prot & Lispro (HUMALOG  MIX 75/25 KWIKPEN) (75-25) 100 UNIT/ML Kwikpen Inject 60 Units into the skin daily with breakfast AND 50 Units daily with supper. 08/12/23   Shamleffer, Ibtehal Jaralla, MD  Insulin  Pen Needle (BD PEN NEEDLE NANO U/F) 32G X 4 MM MISC 1 Device by Other route in the morning and at bedtime. use as directed 08/12/23   Shamleffer, Ibtehal Jaralla, MD  lisinopril  (ZESTRIL ) 20 MG tablet TAKE 1 TABLET BY MOUTH DAILY 08/05/23   Mercer Clotilda SAUNDERS, MD  meloxicam  (MOBIC ) 15 MG tablet TAKE 1 TABLET (15 MG TOTAL) BY MOUTH DAILY. 06/27/23   Joya Stabs, DPM   meloxicam  (MOBIC ) 15 MG tablet TAKE 1 TABLET BY MOUTH DAILY 11/07/23   Mercer Clotilda SAUNDERS, MD  metFORMIN  (GLUCOPHAGE -XR) 500 MG 24 hr tablet Take 1 tablet (500 mg total) by mouth daily with breakfast. 08/12/23   Shamleffer, Ibtehal Jaralla, MD  Multiple Vitamin (MULTIVITAMIN WITH MINERALS) TABS tablet Take 1 tablet by mouth daily.    [provider]  nystatin -triamcinolone  ointment (MYCOLOG) Apply 1 Application topically 2 (two) times daily. 08/17/23   Chrzanowski, Jami B, NP  omeprazole (PRILOSEC) 20 MG capsule Take 20 mg by mouth every evening. 02/09/10   [provider]  rosuvastatin  (CRESTOR ) 40 MG tablet Take 1 tablet (40 mg total) by mouth daily. 08/12/23   Shamleffer, Ibtehal Jaralla, MD  Simethicone  (GAS-X PO) Take by mouth in the morning  and at bedtime.    [provider]    Physical Exam: Vitals:   02/16/24 0204 02/16/24 0606 02/16/24 0644 02/16/24 0650  BP: (!) 160/96  (!) 188/102 (!) 188/93  Pulse: 89  90 90  Resp: 20  18   Temp: 98.2 F (36.8 C) 98 F (36.7 C) 98.1 F (36.7 C)   TempSrc: Oral Oral Oral   SpO2: 97%  97%   Weight:      Height:        Constitutional: NAD, calm, comfortable Eyes: Right eyelid swelling present without conjunctival injection.  Left eye lid intact without signs of swelling. ENMT: Mucous membranes are moist.  Swelling noted of the right cheek with erythema present.  Dental carie present of the right upper canine tooth Neck: normal, supple.  No stridor present. Respiratory: clear to auscultation bilaterally, no wheezing, no crackles. Normal respiratory effort. No accessory muscle use.  Cardiovascular: Regular rate and rhythm, no murmurs / rubs / gallops. No extremity edema. 2+ pedal pulses. No carotid bruits.  Abdomen: no tenderness, no masses palpated. No hepatosplenomegaly. Bowel sounds positive.  Musculoskeletal: no clubbing / cyanosis. No joint deformity upper and lower extremities. Good ROM, no contractures. Normal  muscle tone.  Skin: no rashes, lesions, ulcers. No induration Neurologic: CN 2-12 grossly intact. Sensation intact, DTR normal. Strength 5/5 in all 4.  Psychiatric: Normal judgment and insight. Alert and oriented x 3. Normal mood.   Data Reviewed:  Reviewed labs, imaging, and pertinent records as documented.  Assessment and Plan:  Facial cellulitis Acute.  Patient presented with right-sided facial swelling and pain.   Patient had been on amoxicillin  but had developed worsening symptoms.  Maxillofacial CT notedmoderate right facial soft tissue swelling compatible with cellulitis likely secondary to periodontal disease with no drainable fluid collection appreciated. blood cultures were not obtained.  Patient had been given morphine  and started on empiric antibiotics of Unasyn . - Admit to a MedSurg bed - Continue empiric antibiotics of Unasyn  - Oxycodone  as needed for pain - Cool compresses as needed - Recommend patient keep outpatient appointment with dentist for tooth extraction in 2 weeks  Leukocytosis Acute.  WBC elevated 11.1.  Thought secondary to above. - Continue to monitor  Essential hypertension On admission blood pressures elevated up to 190/103. - Continue Cardizem  and lisinopril  - Hydralazine  IV as needed for elevated blood pressures  Paroxysmal atrial fibrillation  Patient appears to be in a sinus rhythm at this time.  She takes Eliquis  as needed following results of atrial fibrillation and is not on it continuously. CHA2DS2-VASc score equal to at least 3( based off history of hypertension, diabetes, and sex). - Continue Cardizem   Controlled diabetes mellitus type 2, with long-term use of insulin  Last hemoglobin A1c noted to be 6.8 when checked on 7/21. - Hypoglycemia protocols - Hold metformin  for at least 72 hours due to her receiving IV contrast - Reduced home insulin  75/25 insulin  by 10 units  Hyperlipidemia  - Continue Crestor   DVT prophylaxis:  Lovenox  Advance Care Planning:   Code Status: Full Code    Consults: none  Family Communication: Family updated at bedside  Severity of Illness: The appropriate patient status for this patient is INPATIENT. Inpatient status is judged to be reasonable and necessary in order to provide the required intensity of service to ensure the patient's safety. The patient's presenting symptoms, physical exam findings, and initial radiographic and laboratory data in the context of their chronic comorbidities is felt to place them at  high risk for further clinical deterioration. Furthermore, it is not anticipated that the patient will be medically stable for discharge from the hospital within 2 midnights of admission.   * I certify that at the point of admission it is my clinical judgment that the patient will require inpatient hospital care spanning beyond 2 midnights from the point of admission due to high intensity of service, high risk for further deterioration and high frequency of surveillance required.*  Author: Maximino DELENA Sharps, MD 02/16/2024 7:35 AM  For on call review [email or web address].

## 2024-02-16 NOTE — ED Provider Notes (Signed)
 [place] [facility] AT [facility] Provider Note   CSN: [id number] Arrival date & time: 02/15/24  2255     Patient presents with: Dental Pain   Julia Dickerson is a 48 y.o. female.   The history is provided by the patient and medical records.  Dental Pain Julia Dickerson is a 48 y.o. female who presents to the Emergency Department complaining of facial swelling.  She presents to the emergency department for evaluation of facial swelling is that started earlier today.  She reports that on Monday she developed pain to the right canine.  This morning when she woke up she had pain and swelling to her face.  She did see her a dentist at [facility], and was started on Augmentin .  She took 2 g immediately and she is scheduled for 500 every 8 hours after that.  She has also been applying warm compresses.  She presents to the emergency department due to worsening swelling and pain.  No associated fever, difficulty breathing, difficulty swallowing.  Hx/o DM, paroxysmal afib - not on eliquis  currently, htn, hpl.         Prior to Admission medications   Medication Sig Start Date End Date Taking? Authorizing Provider  Continuous Glucose Sensor (DEXCOM G7 SENSOR) MISC Use as instructed to monitor blood sugar, change every 10 days. 08/12/23   Shamleffer, Ibtehal Jaralla, MD  diltiazem  (CARDIZEM  CD) 240 MG 24 hr capsule Take 1 capsule (240 mg total) by mouth daily. 05/20/23   Anner Alm ORN, MD  diltiazem  (CARDIZEM ) 60 MG tablet MAY TAKE 1 TABLET BY MOUTH EVERY 6 HOURS AS NEEDED UP TO 3 DOSES  PER DAY FOR BREAKTHROUGH (ALONG  WITH USING FLECAINIDE  100 MG) 10/19/23   Anner Alm ORN, MD  ELIQUIS  5 MG TABS tablet TAKE 1 TABLET BY MOUTH TWICE  DAILY AS DIRECTED. ( ONLY USE  FOR BREAK THROUGH AFIB) 12/26/23   Anner Alm ORN, MD  empagliflozin  (JARDIANCE ) 25 MG TABS tablet Take 1 tablet (25 mg total) by mouth daily before breakfast. 08/12/23   Shamleffer, Ibtehal Jaralla,  MD  flecainide  (TAMBOCOR ) 100 MG tablet TAKE 1 TABLET BY MOUTH TWICE  DAILY AS NEEDED FOR BREAKTHROUGH AFIB 12/26/23   Anner Alm ORN, MD  fluticasone  (FLONASE ) 50 MCG/ACT nasal spray USE 1 SPRAY IN BOTH NOSTRILS  DAILY 09/07/22   Mercer Clotilda SAUNDERS, MD  Insulin  Lispro Prot & Lispro (HUMALOG  MIX 75/25 KWIKPEN) (75-25) 100 UNIT/ML Kwikpen Inject 60 Units into the skin daily with breakfast AND 50 Units daily with supper. 08/12/23   Shamleffer, Ibtehal Jaralla, MD  Insulin  Pen Needle (BD PEN NEEDLE NANO U/F) 32G X 4 MM MISC 1 Device by Other route in the morning and at bedtime. use as directed 08/12/23   Shamleffer, Ibtehal Jaralla, MD  lisinopril  (ZESTRIL ) 20 MG tablet TAKE 1 TABLET BY MOUTH DAILY 08/05/23   Mercer Clotilda SAUNDERS, MD  meloxicam  (MOBIC ) 15 MG tablet TAKE 1 TABLET (15 MG TOTAL) BY MOUTH DAILY. 06/27/23   Joya Stabs, DPM  meloxicam  (MOBIC ) 15 MG tablet TAKE 1 TABLET BY MOUTH DAILY 11/07/23   Mercer Clotilda SAUNDERS, MD  metFORMIN  (GLUCOPHAGE -XR) 500 MG 24 hr tablet Take 1 tablet (500 mg total) by mouth daily with breakfast. 08/12/23   Shamleffer, Ibtehal Jaralla, MD  Multiple Vitamin (MULTIVITAMIN WITH MINERALS) TABS tablet Take 1 tablet by mouth daily.    [provider]  nystatin -triamcinolone  ointment (MYCOLOG) Apply 1 Application topically 2 (two) times daily. 08/17/23  Chrzanowski, Jami B, NP  omeprazole (PRILOSEC) 20 MG capsule Take 20 mg by mouth every evening. 02/09/10   [provider]  rosuvastatin  (CRESTOR ) 40 MG tablet Take 1 tablet (40 mg total) by mouth daily. 08/12/23   Shamleffer, Ibtehal Jaralla, MD  Simethicone  (GAS-X PO) Take by mouth in the morning and at bedtime.    [provider]    Allergies: Sulfa antibiotics    Review of Systems  All other systems reviewed and are negative.   Updated Vital Signs BP (!) 190/103 (BP Location: Left Arm)   Pulse (!) 102   Temp 97.8 F (36.6 C) (Oral)   Resp 16   Ht 5' 8 (1.727 m)   Wt 120 kg   LMP 11/23/2017    SpO2 98%   BMI 40.22 kg/m   Physical Exam Vitals and nursing note reviewed.  Constitutional:      Appearance: She is well-developed.  HENT:     Head: Normocephalic and atraumatic.     Comments: Moderate soft tissue swelling to the right face with local tenderness and erythema, predominantly over maxilla.  Mild right periorbital erythema and edema, EOMI. there is dental decay at the base of tooth 12 and erythema around the base of tooth 11, both of these are tender to palpation.  There is no edema or erythema in the posterior oropharynx Cardiovascular:     Rate and Rhythm: Normal rate and regular rhythm.  Pulmonary:     Effort: Pulmonary effort is normal. No respiratory distress.  Abdominal:     Palpations: Abdomen is soft.     Tenderness: There is no abdominal tenderness. There is no guarding or rebound.  Musculoskeletal:        General: No tenderness.  Skin:    General: Skin is warm and dry.  Neurological:     Mental Status: She is alert and oriented to person, place, and time.  Psychiatric:        Behavior: Behavior normal.     (all labs ordered are listed, but only abnormal results are displayed) Labs Reviewed  CBC WITH DIFFERENTIAL/PLATELET - Abnormal; Notable for the following components:      Result Value   WBC 11.1 (*)    RBC 5.29 (*)    Hemoglobin 16.5 (*)    HCT 46.3 (*)    All other components within normal limits  COMPREHENSIVE METABOLIC PANEL WITH GFR - Abnormal; Notable for the following components:   CO2 21 (*)    Glucose, Bld 174 (*)    BUN 22 (*)    Total Protein 8.3 (*)    All other components within normal limits    EKG: None  Radiology: No results found.   Procedures   Medications Ordered in the ED  oxyCODONE -acetaminophen  (PERCOCET/ROXICET) 5-325 MG per tablet 1 tablet (1 tablet Oral Given 02/16/24 0134)                                    Medical Decision Making Amount and/or Complexity of Data Reviewed Labs: ordered. Radiology:  ordered.  Risk Prescription drug management. Decision regarding hospitalization.   Patient with history of diabetes, paroxysmal atrial fibrillation here for evaluation of progression of facial swelling and pain after starting antibiotics earlier today for dental infection.  She does have moderate to severe swelling to the right side of her face.  She has tachycardia, leukocytosis, no fever.  She was started  on antibiotics for facial cellulitis.  CT face was obtained, which is negative for drainable fluid collection.  Given extent of her swelling, risk factor of diabetes recommend admission for IV antibiotics.  Patient is in agreement for admission for ongoing treatment.  Hospitalist consulted for admission.     Final diagnoses:  Facial cellulitis    ED Discharge Orders     None          Griselda Norris, MD 02/16/24 [id number]

## 2024-02-17 DIAGNOSIS — L03211 Cellulitis of face: Secondary | ICD-10-CM | POA: Diagnosis not present

## 2024-02-17 LAB — CBC
HCT: 43.4 % (ref 36.0–46.0)
Hemoglobin: 15.3 g/dL — ABNORMAL HIGH (ref 12.0–15.0)
MCH: 31.8 pg (ref 26.0–34.0)
MCHC: 35.3 g/dL (ref 30.0–36.0)
MCV: 90.2 fL (ref 80.0–100.0)
Platelets: 232 K/uL (ref 150–400)
RBC: 4.81 MIL/uL (ref 3.87–5.11)
RDW: 13.3 % (ref 11.5–15.5)
WBC: 9.4 K/uL (ref 4.0–10.5)
nRBC: 0 % (ref 0.0–0.2)

## 2024-02-17 LAB — BASIC METABOLIC PANEL WITH GFR
Anion gap: 14 (ref 5–15)
BUN: 26 mg/dL — ABNORMAL HIGH (ref 6–20)
CO2: 23 mmol/L (ref 22–32)
Calcium: 9.3 mg/dL (ref 8.9–10.3)
Chloride: 100 mmol/L (ref 98–111)
Creatinine, Ser: 0.86 mg/dL (ref 0.44–1.00)
GFR, Estimated: >60 mL/min (ref >60)
Glucose, Bld: 196 mg/dL — ABNORMAL HIGH (ref 70–99)
Potassium: 4.2 mmol/L (ref 3.5–5.1)
Sodium: 137 mmol/L (ref 135–145)

## 2024-02-17 LAB — GLUCOSE, CAPILLARY
Glucose-Capillary: 193 mg/dL — ABNORMAL HIGH (ref 70–99)
Glucose-Capillary: 226 mg/dL — ABNORMAL HIGH (ref 70–99)
Glucose-Capillary: 188 mg/dL — ABNORMAL HIGH (ref 70–99)
Glucose-Capillary: 161 mg/dL — ABNORMAL HIGH (ref 70–99)

## 2024-02-17 MED ORDER — PANTOPRAZOLE SODIUM 40 MG IV SOLR
40.0000 mg | Freq: Two times a day (BID) | INTRAVENOUS | Status: DC
  Administered 2024-02-17 – 2024-02-18 (×3): 40 mg via INTRAVENOUS
  Filled 2024-02-17 (×2): qty 10

## 2024-02-17 MED ORDER — INSULIN ASPART PROT & ASPART (70-30 MIX) 100 UNIT/ML ~~LOC~~ SUSP
50.0000 [IU] | Freq: Two times a day (BID) | SUBCUTANEOUS | Status: DC
  Administered 2024-02-17 – 2024-02-18 (×2): 50 [IU] via SUBCUTANEOUS
  Filled 2024-02-17: qty 10

## 2024-02-17 MED ORDER — ENOXAPARIN SODIUM 60 MG/0.6ML IJ SOSY
60.0000 mg | PREFILLED_SYRINGE | INTRAMUSCULAR | Status: DC
  Administered 2024-02-17: 60 mg via SUBCUTANEOUS
  Filled 2024-02-17: qty 0.6

## 2024-02-17 MED ORDER — KETOROLAC TROMETHAMINE 15 MG/ML IJ SOLN
7.5000 mg | Freq: Three times a day (TID) | INTRAMUSCULAR | Status: DC
  Administered 2024-02-17 – 2024-02-18 (×3): 7.5 mg via INTRAVENOUS
  Filled 2024-02-17 (×3): qty 1

## 2024-02-17 NOTE — Progress Notes (Addendum)
 PROGRESS NOTE    Julia Dickerson  FMW:[id number] DOB: 02/03/1976 DOA: 02/16/2024 PCP: Mercer Clotilda SAUNDERS, MD   Brief Narrative: 48 year old with past medical history significant for hypertension, paroxysmal A-fib, diabetes, PCOS, depression, anxiety, obesity, GERD presented with a swelling of the right side of her face.  Symptoms began 3 days prior to admission with pain in the right upper [place].  She was seen by her dentist and prescribed amoxicillin  which she had taken 2 doses the day prior to admission.  She is scheduled to have the tooth extracted in 2 weeks.  The morning of admission she presented with significant swelling of the right side of the face causing her right eye to close as well as pain.  CT maxillofacial showed moderate right-sided facial soft tissue swelling compatible with cellulitis likely secondary to periodontal disease with no drainable fluid collection appreciated.  Patient admitted for right facial cellulitis secondary to odontogenic source  Assessment & Plan:   Principal Problem:   Facial cellulitis Active Problems:   Leukocytosis   Essential hypertension   Paroxysmal atrial fibrillation (HCC): CHA2DS2Vasc ~3   Controlled type 2 diabetes mellitus with complication, with long-term current use of insulin  (HCC)   Hyperlipidemia   1-Right Facial Cellulitis: -Swelling decreasing.  -Continue with IV Unasyn .  Will add Toradol  IV , IV Protonix .   Leukocytosis -in setting of Number 1 Trending down.   Essential hypertension -Continue with Cardizem , lisinopril    Paroxysmal A-fib -Continue with Cardizem , Tikasine.   Controlled diabetes type 2 -She took her home 75/25 again this am. Explain to patient that we will provide insulin  to her. Plan to do 70/30. Start tonight.  Her home insulin  will be storage at the pharmacy, discussed with nurse.   Hyperlipidemia -continue crestor .    Estimated body mass index is 40.22 kg/m as calculated from the following:    Height as of this encounter: 5' 8 (1.727 m).   Weight as of this encounter: 120 kg.   DVT prophylaxis: Lovenox  Code Status: Full code Family Communication: Care discussed with patient.  Disposition Plan:  Status is: Inpatient Remains inpatient appropriate because: home tomorrow if swelling better.     Consultants:  none  Procedures:    Antimicrobials:    Subjective: Swelling left side face better. She is able to open her eyes better. She denies dysphagia.   Objective: Vitals:   02/16/24 1606 02/16/24 1806 02/16/24 2112 02/17/24 0434  BP: (!) 171/93 (!) 171/93 (!) 146/69 112/65  Pulse: 89  98 75  Resp: 16  18 18   Temp: 98.6 F (37 C)  98.6 F (37 C) 97.9 F (36.6 C)  TempSrc: Oral  Oral Oral  SpO2: 97%  94% 96%  Weight:      Height:        Intake/Output Summary (Last 24 hours) at 02/17/2024 0738 Last data filed at 02/17/2024 0200 Gross per 24 hour  Intake 672 ml  Output --  Net 672 ml   Filed Weights   02/15/24 2301  Weight: 120 kg    Examination:  General exam: Appears calm and comfortable , left face swelling and redness Respiratory system: Clear to auscultation. Respiratory effort normal. Cardiovascular system: S1 & S2 heard, RRR. No JVD, murmurs, rubs, gallops or clicks. No pedal edema. Gastrointestinal system: Abdomen is nondistended, soft and nontender. No organomegaly or masses felt. Normal bowel sounds heard. Central nervous system: Alert and oriented. No focal neurological deficits. Extremities: Symmetric 5 x 5 power.   Data Reviewed: I  have personally reviewed following labs and imaging studies  CBC: Recent Labs  Lab 02/15/24 2309 02/17/24 0620  WBC 11.1* 9.4  NEUTROABS 7.7  --   HGB 16.5* 15.3*  HCT 46.3* 43.4  MCV 87.5 90.2  PLT 252 232   Basic Metabolic Panel: Recent Labs  Lab 02/15/24 2309  NA 138  K 4.0  CL 104  CO2 21*  GLUCOSE 174*  BUN 22*  CREATININE 0.66  CALCIUM  9.8   GFR: Estimated Creatinine Clearance:  118.4 mL/min (by C-G formula based on SCr of 0.66 mg/dL). Liver Function Tests: Recent Labs  Lab 02/15/24 2309  AST 20  ALT 19  ALKPHOS 71  BILITOT 0.6  PROT 8.3*  ALBUMIN 4.9   No results for input(s): LIPASE, AMYLASE in the last 168 hours. No results for input(s): AMMONIA in the last 168 hours. Coagulation Profile: No results for input(s): INR, PROTIME in the last 168 hours. Cardiac Enzymes: No results for input(s): CKTOTAL, CKMB, CKMBINDEX, TROPONINI in the last 168 hours. BNP (last 3 results) No results for input(s): PROBNP in the last 8760 hours. HbA1C: No results for input(s): HGBA1C in the last 72 hours. CBG: Recent Labs  Lab 02/16/24 2008 02/16/24 2205  GLUCAP 219* 223*   Lipid Profile: No results for input(s): CHOL, HDL, LDLCALC, TRIG, CHOLHDL, LDLDIRECT in the last 72 hours. Thyroid  Function Tests: No results for input(s): TSH, T4TOTAL, FREET4, T3FREE, THYROIDAB in the last 72 hours. Anemia Panel: No results for input(s): VITAMINB12, FOLATE, FERRITIN, TIBC, IRON, RETICCTPCT in the last 72 hours. Sepsis Labs: No results for input(s): PROCALCITON, LATICACIDVEN in the last 168 hours.  No results found for this or any previous visit (from the past 240 hours).       Radiology Studies: CT Maxillofacial W Contrast Result Date: 02/16/2024 CLINICAL DATA:  Sinus or nasal mass (Ped 0-17y) facial swelling - eval for abscess EXAM: CT MAXILLOFACIAL WITH CONTRAST TECHNIQUE: Multidetector CT imaging of the maxillofacial structures was performed with intravenous contrast. Multiplanar CT image reconstructions were also generated. RADIATION DOSE REDUCTION: This exam was performed according to the departmental dose-optimization program which includes automated exposure control, adjustment of the mA and/or kV according to patient size and/or use of iterative reconstruction technique. CONTRAST:  75mL OMNIPAQUE  IOHEXOL  300  MG/ML  SOLN COMPARISON:  None Available. FINDINGS: Osseous: The facial bones are intact. There is a caries involving the right maxillary canine. There is moderate soft tissue stranding of the subcutaneous fat overlying the right maxilla. There is no focal fluid collection to indicate abscess. There is no evidence of osteomyelitis. Orbits: Normal. Sinuses: Clear. Soft tissues: Right facial soft tissue swelling most pronounced over lying the maxilla. No drainable fluid collection. Limited intracranial: Normal. IMPRESSION: 1. Moderate right facial soft tissue swelling, compatible with cellulitis, likely secondary to periodontal disease. No drainable fluid collection. Electronically Signed   By: Evalene Coho M.D.   On: 02/16/2024 03:18        Scheduled Meds:  diltiazem   240 mg Oral Daily   empagliflozin   25 mg Oral QAC breakfast   enoxaparin  (LOVENOX ) injection  40 mg Subcutaneous Q24H   insulin  aspart  10 Units Subcutaneous QAC breakfast   And   insulin  aspart  10 Units Subcutaneous QAC supper   insulin  glargine-yfgn  30 Units Subcutaneous Q supper   insulin  glargine-yfgn  35 Units Subcutaneous Q breakfast   lisinopril   20 mg Oral Daily   rosuvastatin   40 mg Oral Daily   sodium chloride  flush  3 mL Intravenous Q12H   Continuous Infusions:  ampicillin -sulbactam (UNASYN ) IV 3 g (02/17/24 0114)     LOS: 1 day    Time spent: 35 Minutes    Aleira Deiter A Casin Federici, MD [facility]   If 7PM-7AM, please contact night-coverage [email or web address]  02/17/2024, 7:38 AM

## 2024-02-17 NOTE — Progress Notes (Signed)
 RAC PIV has been removed d/t drainage coming from the site. New IV will be placed.

## 2024-02-17 NOTE — Progress Notes (Signed)
 I paged Andrez, NP to inform her that around 1900 tonight the pt took her own 75/25 insulin  50 units. She said someone (she does not know who) told her she could. She is willing going forward to only take what is ordered here. Lavanda Andrez, NP responded to my message, make a note of her use of personal insulin  and hold insulin  orders. Monitor for signs of hypoglycemia, check as needed.

## 2024-02-17 NOTE — Inpatient Diabetes Management (Signed)
 Inpatient Diabetes Program Recommendations  AACE/ADA: New Consensus Statement on Inpatient Glycemic Control (2015)  Target Ranges:  Prepandial:   less than 140 mg/dL      Peak postprandial:   less than 180 mg/dL (1-2 hours)      Critically ill patients:  140 - 180 mg/dL   Lab Results  Component Value Date   GLUCAP 226 (H) 02/17/2024   HGBA1C 6.8 (A) 02/13/2024    Review of Glycemic Control  Latest Reference Range & Units 02/16/24 20:08 02/16/24 22:05 02/17/24 08:11 02/17/24 11:36  Glucose-Capillary 70 - 99 mg/dL 780 (H) 776 (H) 806 (H) 226 (H)  (H): Data is abnormally high  Diabetes history: DM2 Outpatient Diabetes medications: 75/25 64 units QAM, 50 units QPM, Jardiance  25 mg every day, Metformin  500 mg QD Current orders for Inpatient glycemic control: 70/30 50 units BID, Jardiance  25 mg QD  Inpatient Diabetes Program Recommendations:    Please consider adding:  Novolog  0-9 units TID and 0-5 units at bedtime.  Thank you, Wyvonna Pinal, MSN, CDCES Diabetes Coordinator Inpatient Diabetes Program [phone] (team pager from 8a-5p)

## 2024-02-17 NOTE — Plan of Care (Signed)
  Problem: Nutrition: Goal: Adequate nutrition will be maintained Outcome: Progressing   Problem: Pain Managment: Goal: General experience of comfort will improve and/or be controlled Outcome: Progressing   Problem: Coping: Goal: Level of anxiety will decrease Outcome: Adequate for Discharge   Problem: Elimination: Goal: Will not experience complications related to bowel motility Outcome: Adequate for Discharge   Problem: Safety: Goal: Ability to remain free from injury will improve Outcome: Adequate for Discharge

## 2024-02-17 NOTE — Progress Notes (Signed)
 Patient's home med Humalog  75/25 has been sent to pharmacy.

## 2024-02-17 NOTE — Plan of Care (Signed)
   Problem: Clinical Measurements: Goal: Ability to maintain clinical measurements within normal limits will improve Outcome: Progressing

## 2024-02-18 ENCOUNTER — Other Ambulatory Visit (HOSPITAL_COMMUNITY): Payer: Self-pay

## 2024-02-18 DIAGNOSIS — L03211 Cellulitis of face: Secondary | ICD-10-CM | POA: Diagnosis not present

## 2024-02-18 LAB — GLUCOSE, CAPILLARY
Glucose-Capillary: 138 mg/dL — ABNORMAL HIGH (ref 70–99)
Glucose-Capillary: 123 mg/dL — ABNORMAL HIGH (ref 70–99)

## 2024-02-18 MED ORDER — PANTOPRAZOLE SODIUM 40 MG PO TBEC: 40.0000 mg | DELAYED_RELEASE_TABLET | Freq: Every day | ORAL | 1 refills | Status: AC

## 2024-02-18 MED ORDER — IBUPROFEN 200 MG PO TABS: 400.0000 mg | ORAL_TABLET | Freq: Four times a day (QID) | ORAL | 0 refills | Status: AC | PRN

## 2024-02-18 MED ORDER — AMOXICILLIN-POT CLAVULANATE 875-125 MG PO TABS: 1.0000 | ORAL_TABLET | Freq: Two times a day (BID) | ORAL | 0 refills | Status: AC

## 2024-02-18 MED ORDER — OXYCODONE-ACETAMINOPHEN 5-325 MG PO TABS: 1.0000 | ORAL_TABLET | Freq: Three times a day (TID) | ORAL | 0 refills | Status: AC | PRN

## 2024-02-18 NOTE — Plan of Care (Signed)

## 2024-02-18 NOTE — OR Nursing (Signed)
 Pt discharged with her husband to home for self-care. AVS discussed and Insulin  meds given to PT. VSS and AOX4.

## 2024-02-18 NOTE — Discharge Summary (Signed)
 Physician Discharge Summary   Patient: Julia Dickerson MRN: [id number] DOB: 08/19/1975  Admit date:     02/16/2024  Discharge date: 02/18/24  Discharge Physician: Owen DELENA Lore   PCP: Julia Clotilda SAUNDERS, MD   Recommendations at discharge:   Follow up with Dentist for further care odontogenic infection Follow up with PCP for further care of chronic medical problems.   Discharge Diagnoses: Principal Problem:   Facial cellulitis Active Problems:   Leukocytosis   Essential hypertension   Paroxysmal atrial fibrillation (HCC): CHA2DS2Vasc ~3   Controlled type 2 diabetes mellitus with complication, with long-term current use of insulin  (HCC)   Hyperlipidemia  Resolved Problems:   * No resolved hospital problems. *  Hospital Course: 48 year old with past medical history significant for hypertension, paroxysmal A-fib, diabetes, PCOS, depression, anxiety, obesity, GERD presented with a swelling of the right side of her face.  Symptoms began 3 days prior to admission with pain in the right upper [place].  She was seen by her dentist and prescribed amoxicillin  which she had taken 2 doses the day prior to admission.  She is scheduled to have the tooth extracted in 2 weeks.  The morning of admission she presented with significant swelling of the right side of the face causing her right eye to close as well as pain.  CT maxillofacial showed moderate right-sided facial soft tissue swelling compatible with cellulitis likely secondary to periodontal disease with no drainable fluid collection appreciated.   Patient admitted for right facial cellulitis secondary to odontogenic source    Assessment and Plan: 1-Right Facial Cellulitis: -Patient was treated  with IV Unasyn . Redness and facial swelling significantly improved.  -Received  Toradol  IV , IV Protonix .  -stable for discharge. Plan to discharge on Augmentin  for 10 days/ Needs to follow up with Dentist out patient.   Leukocytosis -in  setting of Number 1 Trending down.    Essential hypertension -Continue with Cardizem , lisinopril     Paroxysmal A-fib -Continue with Cardizem , Tikasine.    Controlled diabetes type 2 -She took her home 75/25 again this am. Explain to patient that we will provide insulin  to her. Plan to do 70/30. Resume Home regimen at discharge.   Hyperlipidemia -continue crestor .           Consultants: None Procedures performed: None Disposition: Home Diet recommendation:  Discharge Diet Orders (From admission, onward)     Start     Ordered   02/18/24 0000  Diet - low sodium heart healthy        02/18/24 0925           Carb modified diet DISCHARGE MEDICATION: Allergies as of 02/18/2024       Reactions   Sulfa Antibiotics Hives        Medication List     STOP taking these medications    amoxicillin  500 MG capsule Commonly known as: AMOXIL    meloxicam  15 MG tablet Commonly known as: MOBIC    omeprazole 20 MG capsule Commonly known as: PRILOSEC       TAKE these medications    amoxicillin -clavulanate 875-125 MG tablet Commonly known as: AUGMENTIN  Take 1 tablet by mouth 2 (two) times daily for 10 days.   BD Pen Needle Nano U/F 32G X 4 MM Misc Generic drug: Insulin  Pen Needle 1 Device by Other route in the morning and at bedtime. use as directed   Dexcom G7 Sensor Misc Use as instructed to monitor blood sugar, change every 10 days.   diltiazem  240  MG 24 hr capsule Commonly known as: CARDIZEM  CD Take 1 capsule (240 mg total) by mouth daily.   diltiazem  60 MG tablet Commonly known as: CARDIZEM  MAY TAKE 1 TABLET BY MOUTH EVERY 6 HOURS AS NEEDED UP TO 3 DOSES  PER DAY FOR BREAKTHROUGH (ALONG  WITH USING FLECAINIDE  100 MG)   Eliquis  5 MG Tabs tablet Generic drug: apixaban  TAKE 1 TABLET BY MOUTH TWICE  DAILY AS DIRECTED. ( ONLY USE  FOR BREAK THROUGH AFIB) What changed: See the new instructions.   empagliflozin  25 MG Tabs tablet Commonly known as:  Jardiance  Take 1 tablet (25 mg total) by mouth daily before breakfast.   FISH OIL PO Take 1 capsule by mouth at bedtime.   flecainide  100 MG tablet Commonly known as: TAMBOCOR  TAKE 1 TABLET BY MOUTH TWICE  DAILY AS NEEDED FOR BREAKTHROUGH AFIB   fluticasone  50 MCG/ACT nasal spray Commonly known as: FLONASE  USE 1 SPRAY IN BOTH NOSTRILS  DAILY What changed: See the new instructions.   ibuprofen  200 MG tablet Commonly known as: ADVIL  Take 2 tablets (400 mg total) by mouth every 6 (six) hours as needed for headache or moderate pain (pain score 4-6). What changed: when to take this   Insulin  Lispro Prot & Lispro (75-25) 100 UNIT/ML Kwikpen Commonly known as: HumaLOG  Mix 75/25 KwikPen Inject 60 Units into the skin daily with breakfast AND 50 Units daily with supper. What changed: See the new instructions.   lisinopril  20 MG tablet Commonly known as: ZESTRIL  TAKE 1 TABLET BY MOUTH DAILY   metFORMIN  500 MG 24 hr tablet Commonly known as: GLUCOPHAGE -XR Take 1 tablet (500 mg total) by mouth daily with breakfast. What changed: when to take this   nystatin -triamcinolone  ointment Commonly known as: MYCOLOG Apply 1 Application topically 2 (two) times daily. What changed:  when to take this reasons to take this   oxyCODONE -acetaminophen  5-325 MG tablet Commonly known as: PERCOCET/ROXICET Take 1 tablet by mouth every 8 (eight) hours as needed for up to 3 days for severe pain (pain score 7-10).   pantoprazole  40 MG tablet Commonly known as: Protonix  Take 1 tablet (40 mg total) by mouth daily.   rosuvastatin  40 MG tablet Commonly known as: CRESTOR  Take 1 tablet (40 mg total) by mouth daily.   VITAMIN C PO Take 1 tablet by mouth at bedtime.        Follow-up Information     Julia Clotilda SAUNDERS, MD Follow up in 1 week(s).   Specialty: Family Medicine Contact information: [address] [place] [address] [phone]                Discharge Exam: Julia Dickerson   02/15/24 2301  Weight: 120 kg   General; NAD  Condition at discharge: stable  The results of significant diagnostics from this hospitalization (including imaging, microbiology, ancillary and laboratory) are listed below for reference.   Imaging Studies: CT Maxillofacial W Contrast Result Date: 02/16/2024 CLINICAL DATA:  Sinus or nasal mass (Ped 0-17y) facial swelling - eval for abscess EXAM: CT MAXILLOFACIAL WITH CONTRAST TECHNIQUE: Multidetector CT imaging of the maxillofacial structures was performed with intravenous contrast. Multiplanar CT image reconstructions were also generated. RADIATION DOSE REDUCTION: This exam was performed according to the departmental dose-optimization program which includes automated exposure control, adjustment of the mA and/or kV according to patient size and/or use of iterative reconstruction technique. CONTRAST:  75mL OMNIPAQUE  IOHEXOL  300 MG/ML  SOLN COMPARISON:  None Available. FINDINGS: Osseous: The facial bones  are intact. There is a caries involving the right maxillary canine. There is moderate soft tissue stranding of the subcutaneous fat overlying the right maxilla. There is no focal fluid collection to indicate abscess. There is no evidence of osteomyelitis. Orbits: Normal. Sinuses: Clear. Soft tissues: Right facial soft tissue swelling most pronounced over lying the maxilla. No drainable fluid collection. Limited intracranial: Normal. IMPRESSION: 1. Moderate right facial soft tissue swelling, compatible with cellulitis, likely secondary to periodontal disease. No drainable fluid collection. Electronically Signed   By: Evalene Coho M.D.   On: 02/16/2024 03:18    Microbiology: Results for orders placed or performed in visit on 05/11/23  WET PREP FOR TRICH, YEAST, CLUE     Status: None   Collection Time: 05/11/23  4:23 PM   Specimen: Genital  Result Value Ref Range Status   Source: VAGINA  Final   RESULT   Final    Comment: EPITHELIAL  CELLS-PRESENT CLUE CELLS-NONE SEEN YEAST-NONE SEEN TRICHOMONAS-NONE SEEN WBC-MANY BACTERIA-MODERATE EPITH. CELLS (13-20) PER HPF     Labs: CBC: Recent Labs  Lab 02/15/24 2309 02/17/24 0620  WBC 11.1* 9.4  NEUTROABS 7.7  --   HGB 16.5* 15.3*  HCT 46.3* 43.4  MCV 87.5 90.2  PLT 252 232   Basic Metabolic Panel: Recent Labs  Lab 02/15/24 2309 02/17/24 0620  NA 138 137  K 4.0 4.2  CL 104 100  CO2 21* 23  GLUCOSE 174* 196*  BUN 22* 26*  CREATININE 0.66 0.86  CALCIUM  9.8 9.3   Liver Function Tests: Recent Labs  Lab 02/15/24 2309  AST 20  ALT 19  ALKPHOS 71  BILITOT 0.6  PROT 8.3*  ALBUMIN 4.9   CBG: Recent Labs  Lab 02/17/24 0811 02/17/24 1136 02/17/24 1630 02/17/24 2104 02/18/24 0722  GLUCAP 193* 226* 188* 161* 138*    Discharge time spent: greater than 30 minutes.  Signed: Owen DELENA Lore, MD [facility] 02/18/2024

## 2024-02-20 ENCOUNTER — Telehealth: Payer: Self-pay | Admitting: *Deleted

## 2024-02-20 NOTE — Transitions of Care (Post Inpatient/ED Visit) (Signed)
   02/20/2024  Name: Julia Dickerson MRN: [id number] DOB: Oct 26, 1975  Today's TOC FU Call Status:    Attempted to reach the patient regarding the most recent Inpatient/ED visit.  Follow Up Plan: Additional outreach attempts will be made to reach the patient to complete the Transitions of Care (Post Inpatient/ED visit) call.   Signature Vernell English CMA

## 2024-05-06 ENCOUNTER — Other Ambulatory Visit: Payer: Self-pay | Admitting: Internal Medicine

## 2024-05-06 ENCOUNTER — Other Ambulatory Visit: Payer: Self-pay | Admitting: Cardiology

## 2024-05-06 DIAGNOSIS — I480 Paroxysmal atrial fibrillation: Secondary | ICD-10-CM

## 2024-05-08 ENCOUNTER — Other Ambulatory Visit: Payer: Self-pay

## 2024-05-08 MED ORDER — APIXABAN 5 MG PO TABS: ORAL_TABLET | ORAL | 3 refills | Status: DC

## 2024-05-08 NOTE — Telephone Encounter (Signed)
 Prescription refill request for Eliquis  received. Indication:afib Last office visit:10/24 Scr:0.86  7/25 Age: 48 Weight:120  kg  Prescription refilled

## 2024-06-16 ENCOUNTER — Other Ambulatory Visit: Payer: Self-pay | Admitting: Internal Medicine

## 2024-06-16 DIAGNOSIS — E1140 Type 2 diabetes mellitus with diabetic neuropathy, unspecified: Secondary | ICD-10-CM

## 2024-07-25 NOTE — Progress Notes (Unsigned)
" °  Cardiology Office Note   Date:  07/25/2024  ID:  Julia Dickerson, Julia Dickerson 04-14-1976, MRN [id number] PCP: Mercer Clotilda SAUNDERS, MD  [facility] Providers Cardiologist:  Alm Clay, MD   History of Present Illness Julia Dickerson is a 48 y.o. female with a past medical history of atrial fibrillation, diabetes, and hypertension here for overdue follow-up appointment.  Was last seen by Dr. Clay 02/17/2023 after a 2-year gap in care.  Reported no breakthrough spells of atrial fibrillation during that period and had not required the use of flecainide .  The patient's endocrinologist initiated Jardiance  and Mounjaro  for diabetes management.  The patient had not been taking Eliquis  for a while but reported no adverse events related to this.  At that office visit she was not having any chest pain or pressure, shortness of breath, strokelike symptoms.  Denied recent illness, fever, chills, cold sweats.  Blood pressure well-controlled on lisinopril  and diltiazem .  From a cholesterol perspective, LDLs were managed by her primary care and she was on 40 mg of statin therapy.  Patient's activity level was reported to be not very high which was influenced by personal stressors including family members health issues.  In summary at her last visit her atrial fibrillation had been well-controlled without the need for breakthrough medicine and her diabetes and hypertension were being managed effectively.  Patient's lifestyle and stress level may benefit from further attention.  Today, she is***  ROS: ***  Studies Reviewed      No recent studies since 2017  Risk Assessment/Calculations {Does this patient have ATRIAL FIBRILLATION?:[phone]} No BP recorded.  {Refresh Note OR Click here to enter BP  :1}***       Physical Exam VS:  LMP 11/23/2017        Wt Readings from Last 3 Encounters:  02/15/24 264 lb 8.8 oz (120 kg)  02/13/24 266 lb (120.7 kg)  08/12/23 257 lb 12.8 oz (116.9 kg)    GEN:  Well nourished, well developed in no acute distress NECK: No JVD; No carotid bruits CARDIAC: ***RRR, no murmurs, rubs, gallops RESPIRATORY:  Clear to auscultation without rales, wheezing or rhonchi  ABDOMEN: Soft, non-tender, non-distended EXTREMITIES:  No edema; No deformity   ASSESSMENT AND PLAN Essential hypertension Hyperlipidemia PAF OSA Secondary hypercoagulable state    {Are you ordering a CV Procedure (e.g. stress test, cath, DCCV, TEE, etc)?   Press F2        :[id number]}  Dispo: ***  Signed, Orren LOISE Fabry, PA-C   "

## 2024-07-27 ENCOUNTER — Ambulatory Visit: Attending: Physician Assistant | Admitting: Physician Assistant

## 2024-07-27 ENCOUNTER — Ambulatory Visit: Admitting: Physician Assistant

## 2024-07-27 ENCOUNTER — Encounter: Payer: Self-pay | Admitting: Physician Assistant

## 2024-07-27 VITALS — BP 122/86 | HR 86 | Ht 67.0 in | Wt 264.0 lb

## 2024-07-27 DIAGNOSIS — E1169 Type 2 diabetes mellitus with other specified complication: Secondary | ICD-10-CM | POA: Diagnosis not present

## 2024-07-27 DIAGNOSIS — E785 Hyperlipidemia, unspecified: Secondary | ICD-10-CM | POA: Diagnosis not present

## 2024-07-27 DIAGNOSIS — G4733 Obstructive sleep apnea (adult) (pediatric): Secondary | ICD-10-CM | POA: Diagnosis not present

## 2024-07-27 DIAGNOSIS — I10 Essential (primary) hypertension: Secondary | ICD-10-CM

## 2024-07-27 DIAGNOSIS — I480 Paroxysmal atrial fibrillation: Secondary | ICD-10-CM | POA: Diagnosis not present

## 2024-07-27 DIAGNOSIS — D6869 Other thrombophilia: Secondary | ICD-10-CM

## 2024-07-27 MED ORDER — DILTIAZEM HCL 60 MG PO TABS: ORAL_TABLET | ORAL | 2 refills | Status: AC

## 2024-07-27 MED ORDER — DILTIAZEM HCL ER COATED BEADS 240 MG PO CP24: 240.0000 mg | ORAL_CAPSULE | Freq: Every day | ORAL | 3 refills | Status: AC

## 2024-07-27 MED ORDER — APIXABAN 5 MG PO TABS: ORAL_TABLET | ORAL | 1 refills | Status: AC

## 2024-07-27 NOTE — Patient Instructions (Signed)
 Medication Instructions:  Your physician recommends that you continue on your current medications as directed. Please refer to the Current Medication list given to you today. *If you need a refill on your cardiac medications before your next appointment, please call your pharmacy*  Lab Work: None ordered If you have labs (blood work) drawn today and your tests are completely normal, you will receive your results only by: MyChart Message (if you have MyChart) OR A paper copy in the mail If you have any lab test that is abnormal or we need to change your treatment, we will call you to review the results.  Testing/Procedures: None ordered  Follow-Up: At [facility], you and your health needs are our priority.  As part of our continuing mission to provide you with exceptional heart care, our providers are all part of one team.  This team includes your primary Cardiologist (physician) and Advanced Practice Providers or APPs (Physician Assistants and Nurse Practitioners) who all work together to provide you with the care you need, when you need it.  Your next appointment:   12 month(s)  Provider:   Alm Clay, MD    We recommend signing up for the patient portal called MyChart.  Sign up information is provided on this After Visit Summary.  MyChart is used to connect with patients for Virtual Visits (Telemedicine).  Patients are able to view lab/test results, encounter notes, upcoming appointments, etc.  Non-urgent messages can be sent to your provider as well.   To learn more about what you can do with MyChart, go to [email or web address].   Other Instructions

## 2024-08-15 ENCOUNTER — Ambulatory Visit: Admitting: Internal Medicine

## 2024-08-15 ENCOUNTER — Encounter: Payer: Self-pay | Admitting: Family Medicine

## 2024-08-15 ENCOUNTER — Encounter: Payer: Self-pay | Admitting: Internal Medicine

## 2024-08-15 ENCOUNTER — Ambulatory Visit: Admitting: Family Medicine

## 2024-08-15 ENCOUNTER — Other Ambulatory Visit

## 2024-08-15 ENCOUNTER — Other Ambulatory Visit: Payer: Self-pay | Admitting: Internal Medicine

## 2024-08-15 VITALS — BP 144/88 | Ht 67.0 in | Wt 266.0 lb

## 2024-08-15 VITALS — BP 130/70 | HR 90 | Temp 98.0°F | Ht 67.0 in | Wt 265.0 lb

## 2024-08-15 DIAGNOSIS — E7800 Pure hypercholesterolemia, unspecified: Secondary | ICD-10-CM

## 2024-08-15 DIAGNOSIS — E1140 Type 2 diabetes mellitus with diabetic neuropathy, unspecified: Secondary | ICD-10-CM

## 2024-08-15 DIAGNOSIS — I10 Essential (primary) hypertension: Secondary | ICD-10-CM | POA: Diagnosis not present

## 2024-08-15 DIAGNOSIS — E1129 Type 2 diabetes mellitus with other diabetic kidney complication: Secondary | ICD-10-CM | POA: Diagnosis not present

## 2024-08-15 DIAGNOSIS — R809 Proteinuria, unspecified: Secondary | ICD-10-CM

## 2024-08-15 DIAGNOSIS — Z794 Long term (current) use of insulin: Secondary | ICD-10-CM

## 2024-08-15 DIAGNOSIS — Z0000 Encounter for general adult medical examination without abnormal findings: Secondary | ICD-10-CM | POA: Diagnosis not present

## 2024-08-15 DIAGNOSIS — E785 Hyperlipidemia, unspecified: Secondary | ICD-10-CM | POA: Diagnosis not present

## 2024-08-15 DIAGNOSIS — I4891 Unspecified atrial fibrillation: Secondary | ICD-10-CM

## 2024-08-15 DIAGNOSIS — Z1211 Encounter for screening for malignant neoplasm of colon: Secondary | ICD-10-CM

## 2024-08-15 DIAGNOSIS — Z1231 Encounter for screening mammogram for malignant neoplasm of breast: Secondary | ICD-10-CM | POA: Diagnosis not present

## 2024-08-15 LAB — POCT GLYCOSYLATED HEMOGLOBIN (HGB A1C): Hemoglobin A1C: 7.1 % — AB (ref 4.0–5.6)

## 2024-08-15 MED ORDER — DEXCOM G7 SENSOR MISC: 3 refills | Status: AC

## 2024-08-15 MED ORDER — EMPAGLIFLOZIN 25 MG PO TABS: 25.0000 mg | ORAL_TABLET | Freq: Every day | ORAL | 3 refills | Status: AC

## 2024-08-15 MED ORDER — LISINOPRIL 20 MG PO TABS: 20.0000 mg | ORAL_TABLET | Freq: Every day | ORAL | 3 refills | Status: AC

## 2024-08-15 MED ORDER — INSULIN PEN NEEDLE 32G X 4 MM MISC: 1.0000 | Freq: Two times a day (BID) | 3 refills | Status: AC

## 2024-08-15 MED ORDER — INSULIN LISPRO PROT & LISPRO (75-25 MIX) 100 UNIT/ML KWIKPEN: PEN_INJECTOR | SUBCUTANEOUS | 3 refills | Status: DC

## 2024-08-15 MED ORDER — METFORMIN HCL ER 500 MG PO TB24: 500.0000 mg | ORAL_TABLET | Freq: Every day | ORAL | 3 refills | Status: AC

## 2024-08-15 NOTE — Progress Notes (Unsigned)
 "      Name: Julia Dickerson  Age/ Sex: 49 y.o., female   MRN/ DOB: [id number], 1975-12-10     PCP: Mercer Clotilda SAUNDERS, MD   Reason for Endocrinology Evaluation: Type 2 diabetes Mellitus  Initial Endocrine Consultative Visit:  09/05/2018    PATIENT IDENTIFIER: Julia Dickerson is a 49 y.o. female with a past medical history of PCOS, T2DM, PAF and HTN.  The patient has followed with Endocrinology clinic since 09/05/2018 for consultative assistance with management of her diabetes.  DIABETIC HISTORY:  [name] was diagnosed with T2DM since the 2000's.  She was initially on metformin , she was switched to MDI insulin  regimen which helped control her hyperglycemia at the time, the patient was subsequently switched to oral glycemic agents in 2012 due to improved glucose control.  She was restarted on basal insulin  in 2019 due to persistent hyperglycemia, she has tried Victoza  in the past with severe nausea. Her hemoglobin A1c has ranged from 8.7% in in 2019, peaking at 10.7% in 08/14/2018.   Prandial insulin  started in February 2020, in addition to continuing her metformin  and basal insulin . Rybelsus  is cost prohibitive   By 05/2019 we switched her MDI regimen to insulin  mix, we started Trulicity , and continued metformin .  Trulicity  stopped 1/2/021 due to GI side effects  Jardiance  started 07/2020  Started Mounjaro  05/2022 which was discontinued 07/2023 due to intolerance to 2.5 mg dose  SUBJECTIVE:   During the last visit (02/13/2024): A1c 6.8 %.     Today (08/15/2024): [name] is here for a follow up on diabetes management. She checks glucose multiple x a day through Dexcom.  She did follow-up with cardiology for history of A-fib, HTN, and dyslipidemia  No nausea  No changes in bowel movements  No UTI's     CONTINUOUS GLUCOSE MONITORING RECORD INTERPRETATION                       HOME DIABETES REGIMEN:  NovoLog  mix 64 units with Breakfast and 50 units with Supper   Metformin  500 mg XR 1 tab daily  Jardiance  25 mg daily  Rosuvastatin  40 mg daily    DIABETIC COMPLICATIONS: Microvascular complications:  Neuropathy  Denies: CKD, retinopathy  Last eye exam: Completed 07/2023   Macrovascular complications:    Denies: CAD, PVD, CVA     HISTORY:  Past Medical History:  Past Medical History:  Diagnosis Date   Anxiety    Arthritis    Atrial fibrillation (HCC)    a. paroxysmal, uses PRN Flecainide    Depression    Diabetes mellitus without complication (HCC)    Dysrhythmia    GERD (gastroesophageal reflux disease)    Hypertension    Obesity    PCOS (polycystic ovarian syndrome)    UTI (lower urinary tract infection)    Past Surgical History:  Past Surgical History:  Procedure Laterality Date   DILATION AND CURETTAGE OF UTERUS  2011 and 2012   x2   GXT - Graded Exercise Tolerance Test  07/22/2016   Exercise time 6 minutes - 7 METs. Maximum heart rate 146 BPM. - No EKG changes. No arrhythmias or chest pain.: Low risk test.   TRANSTHORACIC ECHOCARDIOGRAM  06/2016   EF 6570% with mild LVH. Normal wall motion and diastolic function. Mild RAE. Otherwise normal.   WISDOM TOOTH EXTRACTION     Social History:  reports that she quit smoking about 5 years ago. Her smoking use included cigarettes and  e-cigarettes. She started smoking about 26 years ago. She has a 10.5 pack-year smoking history. She has been exposed to tobacco smoke. She has never used smokeless tobacco. She reports that she does not currently use alcohol. She reports that she does not use drugs. Family History:  Family History  Problem Relation Age of Onset   Arthritis Mother    Diabetes Mother    Kidney disease Mother    Heart disease Mother    Heart failure Father    Heart attack Father    Cancer Father    Diabetes Father    Hearing loss Father    Hyperlipidemia Father    Hypertension Father    Arthritis Sister    COPD Maternal Grandmother    Heart disease Maternal  Grandmother    Hyperlipidemia Maternal Grandmother    Hyperlipidemia Maternal Grandfather    Hypertension Maternal Grandfather    Hearing loss Maternal Grandfather    Alcohol abuse Maternal Grandfather    Arthritis Maternal Grandfather    Stroke Maternal Grandfather    Hyperlipidemia Paternal Grandmother    Miscarriages / Stillbirths Paternal Grandmother    Kidney disease Paternal Grandfather    Hypertension Paternal Grandfather    Heart attack Paternal Grandfather    Diabetes Paternal Grandfather    Breast cancer Neg Hx      HOME MEDICATIONS: Allergies as of 08/15/2024       Reactions   Sulfa Antibiotics Hives        Medication List        Accurate as of August 15, 2024 11:01 AM. If you have any questions, ask your nurse or doctor.          apixaban  5 MG Tabs tablet Commonly known as: Eliquis  TAKE 1 TABLET BY MOUTH TWICE  DAILY.   BD Pen Needle Nano U/F 32G X 4 MM Misc Generic drug: Insulin  Pen Needle 1 Device by Other route in the morning and at bedtime. use as directed   Dexcom G7 Sensor Misc USE AS INSTRUCTED TO MONITOR  BLOOD SUGAR, CHANGE EVERY 10  DAYS   diltiazem  240 MG 24 hr capsule Commonly known as: Cartia  XT Take 1 capsule (240 mg total) by mouth daily.   diltiazem  60 MG tablet Commonly known as: CARDIZEM  MAY TAKE 1 TABLET BY MOUTH EVERY 6 HOURS AS NEEDED UP TO 3 DOSES  PER DAY FOR BREAKTHROUGH (ALONG  WITH USING FLECAINIDE  100 MG)   FISH OIL PO Take 1 capsule by mouth at bedtime.   flecainide  100 MG tablet Commonly known as: TAMBOCOR  TAKE 1 TABLET BY MOUTH TWICE  DAILY AS NEEDED FOR BREAKTHROUGH AFIB   fluticasone  50 MCG/ACT nasal spray Commonly known as: FLONASE  USE 1 SPRAY IN BOTH NOSTRILS  DAILY What changed: See the new instructions.   ibuprofen  200 MG tablet Commonly known as: ADVIL  Take 2 tablets (400 mg total) by mouth every 6 (six) hours as needed for headache or moderate pain (pain score 4-6).   Insulin  Lispro Prot & Lispro  (75-25) 100 UNIT/ML Kwikpen Commonly known as: HumaLOG  Mix 75/25 KwikPen Inject 60 Units into the skin daily with breakfast AND 50 Units daily with supper. What changed: See the new instructions.   Jardiance  25 MG Tabs tablet Generic drug: empagliflozin  TAKE 1 TABLET BY MOUTH DAILY  BEFORE BREAKFAST   lisinopril  20 MG tablet Commonly known as: ZESTRIL  TAKE 1 TABLET BY MOUTH DAILY   metFORMIN  500 MG 24 hr tablet Commonly known as: GLUCOPHAGE -XR Take 1 tablet (500  mg total) by mouth daily with breakfast. What changed: when to take this   nystatin -triamcinolone  ointment Commonly known as: MYCOLOG Apply 1 Application topically 2 (two) times daily. What changed:  when to take this reasons to take this   pantoprazole  40 MG tablet Commonly known as: Protonix  Take 1 tablet (40 mg total) by mouth daily.   rosuvastatin  40 MG tablet Commonly known as: CRESTOR  TAKE 1 TABLET BY MOUTH DAILY   VITAMIN C PO Take 1 tablet by mouth at bedtime.         OBJECTIVE:   Vital Signs: BP (!) 144/88   Ht 5' 7 (1.702 m)   Wt 266 lb (120.7 kg)   LMP 11/23/2017   BMI 41.66 kg/m   Wt Readings from Last 3 Encounters:  08/15/24 266 lb (120.7 kg)  07/27/24 264 lb (119.7 kg)  02/15/24 264 lb 8.8 oz (120 kg)     Exam: General: Pt appears well and is in NAD  Lungs: Clear with good BS bilat   Heart: RRR   Extremities: No pretibial edema.   Neuro: MS is good with appropriate affect, pt is alert and Ox3        DM foot exam: 08/15/2024  The skin of the feet is intact without sores or ulcerations. The pedal pulses are 2+ on right and 2+ on left. The sensation is absent to a screening 5.07, 10 gram monofilament bilaterally    DATA REVIEWED:  Lab Results  Component Value Date   HGBA1C 7.1 (A) 08/15/2024   HGBA1C 6.8 (A) 02/13/2024   HGBA1C 6.5 (A) 08/12/2023   ****  ASSESSMENT / PLAN / RECOMMENDATIONS:   1) Type 2 Diabetes Mellitus,optimally controlled , With Neuropathic  Complications - Most recent A1c of 7.1 %. Goal A1c < 7.0 %.    -Her A1c is optimal but her CGM readings have been discordant with an average of 210 mg/DL -She is intolerant to Trulicity  and higher doses of metformin  -She is not tolerating Mounjaro  2.5 mg due to bloating and sulfur taste - Will increase morning Humalog  as below   MEDICATIONS: - Continue Metformin  500 mg, 1 tablet daily  - Continue Jardiance  25 mg daily - Change Humalog  Mix 64 units with Breakfast  and 50 units with Supper     EDUCATION / INSTRUCTIONS: BG monitoring instructions: Patient is instructed to check her blood sugars 4 times a day, fasting and bedtime. Call [facility] if: BG persistently < 70 . I reviewed the Rule of 15 for the treatment of hypoglycemia in detail with the patient. Literature supplied.    2) Diabetic complications:  Eye: Does not have known diabetic retinopathy.  Neuro/ Feet: Does have known diabetic peripheral neuropathy. Renal: Patient does not have known baseline CKD. She is on an ACEI/ARB at present.    3) Dyslipidemia :  - Lipid panel from January, 2025 showed LDL of 92 Mg/DL with triglycerides of 858 Mg/DL  Medication   Continue rosuvastatin  40 mg daily   F/U in 6 months   Signed electronically by: Stefano Redgie Butts, MD  [facility]  [facility] [address] [place] [place], [place] [address] Phone: [phone] FAX: [phone]   CC: Mercer Clotilda SAUNDERS, MD [address] [place] [address] Phone: [phone]  Fax: [phone]  Return to Endocrinology clinic as below: Future Appointments  Date Time Provider Department Center  08/15/2024  1:00 PM Mercer Clotilda SAUNDERS, MD [facility] [address]  09/04/2024  4:00 PM  Chrzanowski, Jami B, NP [facility]  10/22/2024  4:15 PM Joya Stabs, DPM [facility]      "

## 2024-08-15 NOTE — Patient Instructions (Addendum)
" °-   Continue Metformin  500 mg, 1 tablet daily  - Change Humalog   Mix 64 units with Breakfast and 54 units with Supper  - Continue Jardiance  25  mg, 1 tablet daily with breakfast     - HOW TO TREAT LOW BLOOD SUGARS (Blood sugar LESS THAN 70 MG/DL) Please follow the RULE OF 15 for the treatment of hypoglycemia treatment (when your (blood sugars are less than 70 mg/dL)   STEP 1: Take 15 grams of carbohydrates when your blood sugar is low, which includes:  3-4 GLUCOSE TABS  OR 3-4 OZ OF JUICE OR REGULAR SODA OR ONE TUBE OF GLUCOSE GEL    STEP 2: RECHECK blood sugar in 15 MINUTES STEP 3: If your blood sugar is still low at the 15 minute recheck --> then, go back to STEP 1 and treat AGAIN with another 15 grams of carbohydrates. "

## 2024-08-15 NOTE — Progress Notes (Signed)
 "  Established Patient Office Visit   Subjective  Patient ID: Julia Dickerson, female    DOB: [birth year]  Age: 49 y.o. MRN: [id number]  Chief Complaint  Patient presents with   Annual Exam    Patient is a 49 year old female seen for CPE and follow-up.  Patient states she is doing okay all things considered.  Had several losses in the family last year.  Working on cleaning out her parents home in preparation to put it on the market.  Utilizing EAP through her job.  Patient had appointment with endocrinology this morning.  Had labs at the appointment.  States A1c now 7.1%.  Was lower.  Evening insulin  dose adjusted.  Requesting refill on lisinopril .  Followed by cardiology for A-fib.  Currently stable.    Patient Active Problem List   Diagnosis Date Noted   Facial cellulitis 02/16/2024   Leukocytosis 02/16/2024   Controlled type 2 diabetes mellitus with complication, with long-term current use of insulin  (HCC) 02/16/2024   Secondary hypercoagulable state 07/31/2020   A-fib (HCC) 07/28/2020   Type 2 diabetes mellitus with hyperglycemia, with long-term current use of insulin  (HCC) 02/11/2020   Hypertriglyceridemia 06/15/2019   OSA (obstructive sleep apnea) 10/05/2018   Weight loss 10/05/2018   Former smoker 08/07/2018   PCOS (polycystic ovarian syndrome) 08/03/2018   Chronic diarrhea 09/19/2017   Neuropathic pain 09/19/2017   Type 2 diabetes mellitus with diabetic neuropathy, unspecified (HCC) 08/19/2017   Hyperlipidemia 08/19/2017   Current use of long term anticoagulation 05/31/2017   Atrial fibrillation with RVR (HCC)    Abnormal EKG 06/24/2016   Paroxysmal atrial fibrillation (HCC): CHA2DS2Vasc ~3 06/23/2016   Essential hypertension 06/23/2016   Major depression, recurrent 07/10/2014   Past Medical History:  Diagnosis Date   Anxiety    Arthritis    Atrial fibrillation (HCC)    a. paroxysmal, uses PRN Flecainide    Depression    Diabetes mellitus without complication (HCC)     Dysrhythmia    GERD (gastroesophageal reflux disease)    Hypertension    Obesity    PCOS (polycystic ovarian syndrome)    UTI (lower urinary tract infection)    Past Surgical History:  Procedure Laterality Date   DILATION AND CURETTAGE OF UTERUS  2011 and 2012   x2   GXT - Graded Exercise Tolerance Test  07/22/2016   Exercise time 6 minutes - 7 METs. Maximum heart rate 146 BPM. - No EKG changes. No arrhythmias or chest pain.: Low risk test.   TRANSTHORACIC ECHOCARDIOGRAM  06/2016   EF 6570% with mild LVH. Normal wall motion and diastolic function. Mild RAE. Otherwise normal.   WISDOM TOOTH EXTRACTION     Social History[1] Family History  Problem Relation Age of Onset   Arthritis Mother    Diabetes Mother    Kidney disease Mother    Heart disease Mother    Heart failure Father    Heart attack Father    Cancer Father    Diabetes Father    Hearing loss Father    Hyperlipidemia Father    Hypertension Father    Arthritis Sister    COPD Maternal Grandmother    Heart disease Maternal Grandmother    Hyperlipidemia Maternal Grandmother    Hyperlipidemia Maternal Grandfather    Hypertension Maternal Grandfather    Hearing loss Maternal Grandfather    Alcohol abuse Maternal Grandfather    Arthritis Maternal Grandfather    Stroke Maternal Grandfather    Hyperlipidemia Paternal Grandmother  Miscarriages / Stillbirths Paternal Grandmother    Kidney disease Paternal Grandfather    Hypertension Paternal Grandfather    Heart attack Paternal Grandfather    Diabetes Paternal Grandfather    Breast cancer Neg Hx    Allergies[2]  ROS Negative unless stated above    Objective:     BP 130/70   Pulse 90   Temp 98 F (36.7 C)   Ht 5' 7 (1.702 m)   Wt 265 lb (120.2 kg)   LMP 11/23/2017   SpO2 98%   BMI 41.50 kg/m  BP Readings from Last 3 Encounters:  08/15/24 130/70  08/15/24 (!) 144/88  07/27/24 122/86   Wt Readings from Last 3 Encounters:  08/15/24 265 lb (120.2  kg)  08/15/24 266 lb (120.7 kg)  07/27/24 264 lb (119.7 kg)      Physical Exam Constitutional:      Appearance: Normal appearance.  HENT:     Head: Normocephalic and atraumatic.     Right Ear: Tympanic membrane, ear canal and external ear normal.     Left Ear: Tympanic membrane, ear canal and external ear normal.     Nose: Nose normal.     Mouth/Throat:     Mouth: Mucous membranes are moist.     Pharynx: No oropharyngeal exudate or posterior oropharyngeal erythema.  Eyes:     General: No scleral icterus.    Extraocular Movements: Extraocular movements intact.     Conjunctiva/sclera: Conjunctivae normal.     Pupils: Pupils are equal, round, and reactive to light.  Neck:     Thyroid : No thyromegaly.     Vascular: No carotid bruit.  Cardiovascular:     Rate and Rhythm: Normal rate and regular rhythm.     Pulses: Normal pulses.     Heart sounds: Normal heart sounds. No murmur heard.    No friction rub.  Pulmonary:     Effort: Pulmonary effort is normal.     Breath sounds: Normal breath sounds. No wheezing, rhonchi or rales.  Abdominal:     General: Bowel sounds are normal.     Palpations: Abdomen is soft.     Tenderness: There is no abdominal tenderness.  Musculoskeletal:        General: No deformity. Normal range of motion.  Lymphadenopathy:     Cervical: No cervical adenopathy.  Skin:    General: Skin is warm and dry.     Findings: No lesion.  Neurological:     General: No focal deficit present.     Mental Status: She is alert and oriented to person, place, and time.  Psychiatric:        Mood and Affect: Mood normal.        Thought Content: Thought content normal.        08/15/2024   12:51 PM 08/12/2023    4:44 PM 08/27/2022    1:04 PM  Depression screen PHQ 2/9  Decreased Interest 0 0 1  Down, Depressed, Hopeless 1 0 1  PHQ - 2 Score 1 0 2  Altered sleeping 2 1 1   Tired, decreased energy 2 1 1   Change in appetite 0 0 0  Feeling bad or failure about yourself   0 0 0  Trouble concentrating 0 0 0  Moving slowly or fidgety/restless 0 0 0  Suicidal thoughts 0 0 0  PHQ-9 Score 5 2  4    Difficult doing work/chores Not difficult at all Not difficult at all Not difficult at all  Data saved with a previous flowsheet row definition      08/15/2024   12:52 PM 08/12/2023    4:44 PM 08/27/2022    1:04 PM 03/11/2021    9:10 AM  GAD 7 : Generalized Anxiety Score  Nervous, Anxious, on Edge 0 0  0  1   Control/stop worrying 0 0  0  1   Worry too much - different things 0 0  0  1   Trouble relaxing 0 0  0  1   Restless 0 0  0  0   Easily annoyed or irritable 0 0  1  0   Afraid - awful might happen 0 0  0  0   Total GAD 7 Score 0 0 1 4  Anxiety Difficulty Not difficult at all  Not difficult at all Not difficult at all     Data saved with a previous flowsheet row definition     Results for orders placed or performed in visit on 08/15/24  POCT glycosylated hemoglobin (Hb A1C)  Result Value Ref Range   Hemoglobin A1C 7.1 (A) 4.0 - 5.6 %   HbA1c POC (<> result, manual entry)     HbA1c, POC (prediabetic range)     HbA1c, POC (controlled diabetic range)        Assessment & Plan:   Well adult exam  Type 2 diabetes mellitus with diabetic neuropathy, with long-term current use of insulin  (HCC)  Essential hypertension -     Lisinopril ; Take 1 tablet (20 mg total) by mouth daily.  Dispense: 90 tablet; Refill: 3  Colon cancer screening -     Ambulatory referral to Gastroenterology  Breast cancer screening by mammogram -     3D Screening Mammogram, Left and Right; Future  Age-appropriate health screenings discussed.  Labs obtained earlier today at endocrinology visit including microalbumin..  Will review when available.  Order for mammogram and colonoscopy placed.  Patient declines Tdap at this time.  A1c now 7.1%.  Patient advised to continue with lifestyle modifications as previously 6.8% on 02/13/2024.  Continue medications including metformin  XR  500 mg in a.m., Humalog  75/25 64 units with breakfast, 54 units with supper.  Other adjustments per Endo.  Eye exam done per patient.  BP controlled.  Continue current medications including lisinopril  20 mg daily.  Also continue cardia XT 240 mg daily, Eliquis  5 mg twice daily, flecainide  100 mg twice daily for A-fib.  Continue Crestor  40 mg daily for HLD. Return in about 1 year (around 08/15/2025) for physical.   Clotilda JONELLE Single, MD     [1]  Social History Tobacco Use   Smoking status: Former    Current packs/day: 0.00    Average packs/day: 0.5 packs/day for 21.0 years (10.5 ttl pk-yrs)    Types: Cigarettes, E-cigarettes    Start date: 08/24/1997    Quit date: 08/24/2018    Years since quitting: 5.9    Passive exposure: Past   Smokeless tobacco: Never   Tobacco comments:    Using nicotine  patches  Vaping Use   Vaping status: Never Used  Substance Use Topics   Alcohol use: Not Currently   Drug use: No  [2]  Allergies Allergen Reactions   Sulfa Antibiotics Hives   "

## 2024-08-16 ENCOUNTER — Ambulatory Visit: Payer: Self-pay | Admitting: Internal Medicine

## 2024-08-16 DIAGNOSIS — E1129 Type 2 diabetes mellitus with other diabetic kidney complication: Secondary | ICD-10-CM | POA: Insufficient documentation

## 2024-08-16 DIAGNOSIS — E785 Hyperlipidemia, unspecified: Secondary | ICD-10-CM | POA: Insufficient documentation

## 2024-08-16 LAB — LIPID PANEL
Cholesterol: 154 mg/dL
HDL: 50 mg/dL
LDL Cholesterol (Calc): 77 mg/dL
Non-HDL Cholesterol (Calc): 104 mg/dL
Total CHOL/HDL Ratio: 3.1 (calc)
Triglycerides: 176 mg/dL — ABNORMAL HIGH

## 2024-08-16 LAB — BASIC METABOLIC PANEL WITH GFR
BUN: 17 mg/dL (ref 7–25)
CO2: 26 mmol/L (ref 20–32)
Calcium: 9.5 mg/dL (ref 8.6–10.2)
Chloride: 105 mmol/L (ref 98–110)
Creat: 0.56 mg/dL (ref 0.50–0.99)
Glucose, Bld: 114 mg/dL — ABNORMAL HIGH (ref 65–99)
Potassium: 4.2 mmol/L (ref 3.5–5.3)
Sodium: 142 mmol/L (ref 135–146)
eGFR: 113 mL/min/1.73m2

## 2024-08-16 LAB — TSH: TSH: 0.68 m[IU]/L

## 2024-08-16 LAB — MICROALBUMIN / CREATININE URINE RATIO
Creatinine, Urine: 38 mg/dL (ref 20–275)
Microalb Creat Ratio: 61 mg/g{creat} — ABNORMAL HIGH
Microalb, Ur: 2.3 mg/dL

## 2024-08-16 MED ORDER — ROSUVASTATIN CALCIUM 40 MG PO TABS: 40.0000 mg | ORAL_TABLET | Freq: Every day | ORAL | 3 refills | Status: AC

## 2024-09-04 ENCOUNTER — Ambulatory Visit: Admitting: Radiology

## 2024-10-22 ENCOUNTER — Ambulatory Visit: Admitting: Podiatry

## 2025-02-12 ENCOUNTER — Ambulatory Visit: Admitting: Internal Medicine
